# Patient Record
Sex: Female | Born: 1960 | Race: White | Hispanic: No | Smoking: Current some day smoker
Health system: Southern US, Community
[De-identification: ages and names within clinical notes are randomized; demographics above are authoritative.]

## PROBLEM LIST (undated history)

## (undated) DIAGNOSIS — Z8719 Personal history of other diseases of the digestive system: Secondary | ICD-10-CM

## (undated) DIAGNOSIS — J449 Chronic obstructive pulmonary disease, unspecified: Secondary | ICD-10-CM

## (undated) DIAGNOSIS — G43909 Migraine, unspecified, not intractable, without status migrainosus: Secondary | ICD-10-CM

## (undated) DIAGNOSIS — K219 Gastro-esophageal reflux disease without esophagitis: Secondary | ICD-10-CM

## (undated) DIAGNOSIS — K635 Polyp of colon: Secondary | ICD-10-CM

## (undated) DIAGNOSIS — M1990 Unspecified osteoarthritis, unspecified site: Secondary | ICD-10-CM

## (undated) DIAGNOSIS — K5909 Other constipation: Secondary | ICD-10-CM

## (undated) DIAGNOSIS — N393 Stress incontinence (female) (male): Secondary | ICD-10-CM

## (undated) DIAGNOSIS — F419 Anxiety disorder, unspecified: Secondary | ICD-10-CM

## (undated) DIAGNOSIS — J410 Simple chronic bronchitis: Secondary | ICD-10-CM

## (undated) DIAGNOSIS — I10 Essential (primary) hypertension: Secondary | ICD-10-CM

## (undated) DIAGNOSIS — G8929 Other chronic pain: Secondary | ICD-10-CM

## (undated) DIAGNOSIS — R490 Dysphonia: Secondary | ICD-10-CM

## (undated) DIAGNOSIS — M545 Other chronic pain: Secondary | ICD-10-CM

## (undated) DIAGNOSIS — D519 Vitamin B12 deficiency anemia, unspecified: Secondary | ICD-10-CM

## (undated) HISTORY — DX: Unspecified osteoarthritis, unspecified site: M19.90

## (undated) HISTORY — DX: Chronic obstructive pulmonary disease, unspecified: J44.9

## (undated) HISTORY — DX: Polyp of colon: K63.5

## (undated) HISTORY — DX: Anxiety disorder, unspecified: F41.9

## (undated) HISTORY — PX: APPENDECTOMY: SHX54

## (undated) HISTORY — DX: Migraine, unspecified, not intractable, without status migrainosus: G43.909

## (undated) HISTORY — PX: VAGINAL HYSTERECTOMY: SUR661

## (undated) HISTORY — DX: Gastro-esophageal reflux disease without esophagitis: K21.9

---

## 1998-04-17 ENCOUNTER — Encounter: Payer: Self-pay | Admitting: Emergency Medicine

## 1998-04-17 ENCOUNTER — Inpatient Hospital Stay (HOSPITAL_COMMUNITY): Admission: EM | Admit: 1998-04-17 | Discharge: 1998-04-19 | Payer: Self-pay | Admitting: Emergency Medicine

## 1998-04-21 ENCOUNTER — Encounter: Admission: RE | Admit: 1998-04-21 | Discharge: 1998-04-21 | Payer: Self-pay | Admitting: Sports Medicine

## 1998-07-26 ENCOUNTER — Encounter: Payer: Self-pay | Admitting: Emergency Medicine

## 1998-07-26 ENCOUNTER — Inpatient Hospital Stay (HOSPITAL_COMMUNITY): Admission: EM | Admit: 1998-07-26 | Discharge: 1998-07-26 | Payer: Self-pay | Admitting: Emergency Medicine

## 2001-08-07 ENCOUNTER — Emergency Department (HOSPITAL_COMMUNITY): Admission: EM | Admit: 2001-08-07 | Discharge: 2001-08-07 | Payer: Self-pay | Admitting: Internal Medicine

## 2002-11-17 ENCOUNTER — Emergency Department (HOSPITAL_COMMUNITY): Admission: EM | Admit: 2002-11-17 | Discharge: 2002-11-17 | Payer: Self-pay | Admitting: Emergency Medicine

## 2003-01-30 ENCOUNTER — Emergency Department (HOSPITAL_COMMUNITY): Admission: EM | Admit: 2003-01-30 | Discharge: 2003-01-30 | Payer: Self-pay | Admitting: Emergency Medicine

## 2003-07-29 ENCOUNTER — Emergency Department (HOSPITAL_COMMUNITY): Admission: EM | Admit: 2003-07-29 | Discharge: 2003-07-30 | Payer: Self-pay | Admitting: *Deleted

## 2003-09-13 ENCOUNTER — Observation Stay (HOSPITAL_COMMUNITY): Admission: RE | Admit: 2003-09-13 | Discharge: 2003-09-14 | Payer: Self-pay | Admitting: General Surgery

## 2003-09-13 HISTORY — PX: HEMORRHOID SURGERY: SHX153

## 2004-01-14 HISTORY — PX: CARDIAC CATHETERIZATION: SHX172

## 2004-01-25 ENCOUNTER — Emergency Department (HOSPITAL_COMMUNITY): Admission: EM | Admit: 2004-01-25 | Discharge: 2004-01-26 | Payer: Self-pay | Admitting: *Deleted

## 2004-08-08 ENCOUNTER — Inpatient Hospital Stay (HOSPITAL_COMMUNITY): Admission: EM | Admit: 2004-08-08 | Discharge: 2004-08-09 | Payer: Self-pay | Admitting: Emergency Medicine

## 2004-11-04 ENCOUNTER — Ambulatory Visit (HOSPITAL_COMMUNITY): Admission: RE | Admit: 2004-11-04 | Discharge: 2004-11-04 | Payer: Self-pay | Admitting: *Deleted

## 2004-12-28 ENCOUNTER — Emergency Department (HOSPITAL_COMMUNITY): Admission: EM | Admit: 2004-12-28 | Discharge: 2004-12-28 | Payer: Self-pay | Admitting: Emergency Medicine

## 2005-06-18 ENCOUNTER — Ambulatory Visit: Payer: Self-pay | Admitting: Orthopedic Surgery

## 2005-06-27 ENCOUNTER — Ambulatory Visit (HOSPITAL_COMMUNITY): Admission: RE | Admit: 2005-06-27 | Discharge: 2005-06-27 | Payer: Self-pay | Admitting: Orthopedic Surgery

## 2005-06-27 ENCOUNTER — Ambulatory Visit: Payer: Self-pay | Admitting: Orthopedic Surgery

## 2005-06-27 ENCOUNTER — Encounter (INDEPENDENT_AMBULATORY_CARE_PROVIDER_SITE_OTHER): Payer: Self-pay | Admitting: Specialist

## 2005-06-27 HISTORY — PX: OTHER SURGICAL HISTORY: SHX169

## 2005-06-30 ENCOUNTER — Ambulatory Visit: Payer: Self-pay | Admitting: Orthopedic Surgery

## 2005-07-09 ENCOUNTER — Ambulatory Visit: Payer: Self-pay | Admitting: Orthopedic Surgery

## 2005-07-17 ENCOUNTER — Ambulatory Visit: Payer: Self-pay | Admitting: Orthopedic Surgery

## 2006-06-01 ENCOUNTER — Ambulatory Visit: Payer: Self-pay | Admitting: Orthopedic Surgery

## 2006-06-03 ENCOUNTER — Encounter (HOSPITAL_COMMUNITY): Admission: RE | Admit: 2006-06-03 | Discharge: 2006-07-03 | Payer: Self-pay | Admitting: Orthopedic Surgery

## 2006-06-15 ENCOUNTER — Ambulatory Visit: Payer: Self-pay | Admitting: Orthopedic Surgery

## 2006-08-25 ENCOUNTER — Ambulatory Visit (HOSPITAL_COMMUNITY): Admission: RE | Admit: 2006-08-25 | Discharge: 2006-08-25 | Payer: Self-pay | Admitting: Family Medicine

## 2006-12-29 ENCOUNTER — Ambulatory Visit (HOSPITAL_COMMUNITY): Admission: RE | Admit: 2006-12-29 | Discharge: 2006-12-29 | Payer: Self-pay | Admitting: *Deleted

## 2006-12-30 ENCOUNTER — Ambulatory Visit (HOSPITAL_COMMUNITY): Admission: RE | Admit: 2006-12-30 | Discharge: 2006-12-30 | Payer: Self-pay | Admitting: Internal Medicine

## 2007-01-01 ENCOUNTER — Ambulatory Visit (HOSPITAL_COMMUNITY): Admission: RE | Admit: 2007-01-01 | Discharge: 2007-01-01 | Payer: Self-pay | Admitting: Internal Medicine

## 2007-01-21 ENCOUNTER — Ambulatory Visit (HOSPITAL_COMMUNITY): Admission: RE | Admit: 2007-01-21 | Discharge: 2007-01-21 | Payer: Self-pay | Admitting: Cardiovascular Disease

## 2007-02-23 ENCOUNTER — Ambulatory Visit (HOSPITAL_COMMUNITY): Admission: RE | Admit: 2007-02-23 | Discharge: 2007-02-23 | Payer: Self-pay | Admitting: General Surgery

## 2007-02-23 ENCOUNTER — Encounter (INDEPENDENT_AMBULATORY_CARE_PROVIDER_SITE_OTHER): Payer: Self-pay | Admitting: General Surgery

## 2007-02-23 HISTORY — PX: LAPAROSCOPIC CHOLECYSTECTOMY: SUR755

## 2007-03-16 ENCOUNTER — Ambulatory Visit (HOSPITAL_COMMUNITY): Admission: RE | Admit: 2007-03-16 | Discharge: 2007-03-16 | Payer: Self-pay | Admitting: Family Medicine

## 2007-03-17 ENCOUNTER — Ambulatory Visit (HOSPITAL_COMMUNITY): Admission: RE | Admit: 2007-03-17 | Discharge: 2007-03-17 | Payer: Self-pay | Admitting: Family Medicine

## 2007-04-09 ENCOUNTER — Ambulatory Visit: Payer: Self-pay | Admitting: Internal Medicine

## 2007-04-12 ENCOUNTER — Emergency Department (HOSPITAL_COMMUNITY): Admission: EM | Admit: 2007-04-12 | Discharge: 2007-04-12 | Payer: Self-pay | Admitting: Emergency Medicine

## 2007-04-13 ENCOUNTER — Ambulatory Visit: Payer: Self-pay | Admitting: Internal Medicine

## 2007-04-13 ENCOUNTER — Ambulatory Visit (HOSPITAL_COMMUNITY): Admission: RE | Admit: 2007-04-13 | Discharge: 2007-04-13 | Payer: Self-pay | Admitting: Internal Medicine

## 2007-04-13 DIAGNOSIS — K219 Gastro-esophageal reflux disease without esophagitis: Secondary | ICD-10-CM

## 2007-04-13 HISTORY — PX: COLONOSCOPY: SHX174

## 2007-04-13 HISTORY — DX: Gastro-esophageal reflux disease without esophagitis: K21.9

## 2007-05-12 ENCOUNTER — Ambulatory Visit: Payer: Self-pay | Admitting: Internal Medicine

## 2007-06-24 ENCOUNTER — Ambulatory Visit (HOSPITAL_COMMUNITY): Admission: RE | Admit: 2007-06-24 | Discharge: 2007-06-24 | Payer: Self-pay | Admitting: Internal Medicine

## 2007-06-28 ENCOUNTER — Ambulatory Visit (HOSPITAL_COMMUNITY): Admission: RE | Admit: 2007-06-28 | Discharge: 2007-06-28 | Payer: Self-pay | Admitting: Internal Medicine

## 2007-07-19 ENCOUNTER — Encounter (HOSPITAL_COMMUNITY): Admission: RE | Admit: 2007-07-19 | Discharge: 2007-08-18 | Payer: Self-pay | Admitting: Gastroenterology

## 2007-09-14 ENCOUNTER — Ambulatory Visit: Payer: Self-pay | Admitting: Internal Medicine

## 2008-05-19 ENCOUNTER — Ambulatory Visit (HOSPITAL_COMMUNITY): Admission: RE | Admit: 2008-05-19 | Discharge: 2008-05-19 | Payer: Self-pay | Admitting: Internal Medicine

## 2008-11-10 ENCOUNTER — Observation Stay (HOSPITAL_COMMUNITY): Admission: EM | Admit: 2008-11-10 | Discharge: 2008-11-11 | Payer: Self-pay | Admitting: Emergency Medicine

## 2009-02-28 ENCOUNTER — Emergency Department (HOSPITAL_COMMUNITY): Admission: EM | Admit: 2009-02-28 | Discharge: 2009-02-28 | Payer: Self-pay | Admitting: Emergency Medicine

## 2009-06-21 ENCOUNTER — Ambulatory Visit (HOSPITAL_COMMUNITY): Admission: RE | Admit: 2009-06-21 | Discharge: 2009-06-21 | Payer: Self-pay | Admitting: Internal Medicine

## 2009-08-21 ENCOUNTER — Emergency Department (HOSPITAL_COMMUNITY): Admission: EM | Admit: 2009-08-21 | Discharge: 2009-08-21 | Payer: Self-pay | Admitting: Emergency Medicine

## 2009-09-18 ENCOUNTER — Emergency Department (HOSPITAL_COMMUNITY)
Admission: EM | Admit: 2009-09-18 | Discharge: 2009-09-18 | Payer: Self-pay | Source: Home / Self Care | Admitting: Emergency Medicine

## 2009-11-14 ENCOUNTER — Emergency Department (HOSPITAL_COMMUNITY): Admission: EM | Admit: 2009-11-14 | Discharge: 2009-11-15 | Payer: Self-pay | Admitting: Emergency Medicine

## 2010-02-03 ENCOUNTER — Encounter: Payer: Self-pay | Admitting: Family Medicine

## 2010-02-03 ENCOUNTER — Encounter: Payer: Self-pay | Admitting: Internal Medicine

## 2010-03-28 LAB — URINALYSIS, ROUTINE W REFLEX MICROSCOPIC
Bilirubin Urine: NEGATIVE
Nitrite: NEGATIVE
Specific Gravity, Urine: <1.005 — ABNORMAL LOW (ref 1.005–1.030)
pH: 6 (ref 5.0–8.0)

## 2010-03-28 LAB — CBC
Hemoglobin: 12.6 g/dL (ref 12.0–15.0)
MCH: 31.5 pg (ref 26.0–34.0)
Platelets: 252 10*3/uL (ref 150–400)
RBC: 3.99 MIL/uL (ref 3.87–5.11)
WBC: 11.9 10*3/uL — ABNORMAL HIGH (ref 4.0–10.5)

## 2010-03-28 LAB — HEPATIC FUNCTION PANEL
ALT: 16 U/L (ref 0–35)
AST: 16 U/L (ref 0–37)
Albumin: 3.7 g/dL (ref 3.5–5.2)
Alkaline Phosphatase: 84 U/L (ref 39–117)
Total Bilirubin: 0.2 mg/dL — ABNORMAL LOW (ref 0.3–1.2)
Total Protein: 6.7 g/dL (ref 6.0–8.3)

## 2010-03-28 LAB — BASIC METABOLIC PANEL
CO2: 30 mEq/L (ref 19–32)
Calcium: 9.8 mg/dL (ref 8.4–10.5)
GFR calc Af Amer: >60 mL/min (ref >60)
Sodium: 138 mEq/L (ref 135–145)

## 2010-03-28 LAB — URINE MICROSCOPIC-ADD ON

## 2010-03-28 LAB — DIFFERENTIAL
Eosinophils Absolute: 0.2 10*3/uL (ref 0.0–0.7)
Lymphocytes Relative: 25 % (ref 12–46)
Lymphs Abs: 2.9 10*3/uL (ref 0.7–4.0)
Monocytes Relative: 5 % (ref 3–12)
Neutro Abs: 8.2 10*3/uL — ABNORMAL HIGH (ref 1.7–7.7)
Neutrophils Relative %: 69 % (ref 43–77)

## 2010-04-08 ENCOUNTER — Encounter (HOSPITAL_COMMUNITY): Payer: Self-pay

## 2010-04-08 ENCOUNTER — Ambulatory Visit (HOSPITAL_COMMUNITY)
Admission: RE | Admit: 2010-04-08 | Discharge: 2010-04-08 | Disposition: A | Discharge status: Home / Self Care | Payer: Medicare Other | Source: Ambulatory Visit | Attending: Internal Medicine | Admitting: Internal Medicine

## 2010-04-08 ENCOUNTER — Other Ambulatory Visit (HOSPITAL_COMMUNITY): Payer: Self-pay | Admitting: Internal Medicine

## 2010-04-08 DIAGNOSIS — R059 Cough, unspecified: Secondary | ICD-10-CM

## 2010-04-08 DIAGNOSIS — R05 Cough: Secondary | ICD-10-CM | POA: Insufficient documentation

## 2010-04-18 LAB — CARDIAC PANEL(CRET KIN+CKTOT+MB+TROPI)
CK, MB: 1.1 ng/mL (ref 0.3–4.0)
CK, MB: 1.4 ng/mL (ref 0.3–4.0)
Relative Index: INVALID (ref 0.0–2.5)
Total CK: 41 U/L (ref 7–177)
Total CK: 49 U/L (ref 7–177)
Total CK: 61 U/L (ref 7–177)
Troponin I: 0.04 ng/mL (ref 0.00–0.06)

## 2010-04-18 LAB — URINALYSIS, ROUTINE W REFLEX MICROSCOPIC
Bilirubin Urine: NEGATIVE
Nitrite: NEGATIVE
Specific Gravity, Urine: <1.005 — ABNORMAL LOW (ref 1.005–1.030)
Urobilinogen, UA: 0.2 mg/dL (ref 0.0–1.0)
pH: 5.5 (ref 5.0–8.0)

## 2010-04-18 LAB — BASIC METABOLIC PANEL
Chloride: 106 mEq/L (ref 96–112)
GFR calc non Af Amer: >60 mL/min (ref >60)
Glucose, Bld: 152 mg/dL — ABNORMAL HIGH (ref 70–99)
Potassium: 4.3 mEq/L (ref 3.5–5.1)
Sodium: 138 mEq/L (ref 135–145)

## 2010-04-18 LAB — BLOOD GAS, ARTERIAL
Acid-base deficit: 1 mmol/L (ref 0.0–2.0)
Bicarbonate: 23.6 mEq/L (ref 20.0–24.0)
O2 Saturation: 97.1 %
TCO2: 21.7 mmol/L (ref 0–100)
pO2, Arterial: 83.6 mmHg (ref 80.0–100.0)

## 2010-04-18 LAB — POCT CARDIAC MARKERS
CKMB, poc: <1 ng/mL — ABNORMAL LOW (ref 1.0–8.0)
CKMB, poc: <1 ng/mL — ABNORMAL LOW (ref 1.0–8.0)
Myoglobin, poc: 19.5 ng/mL (ref 12–200)
Troponin i, poc: <0.05 ng/mL (ref 0.00–0.09)

## 2010-04-18 LAB — POCT I-STAT, CHEM 8
Calcium, Ion: 1.13 mmol/L (ref 1.12–1.32)
Chloride: 105 mEq/L (ref 96–112)
Creatinine, Ser: 0.7 mg/dL (ref 0.4–1.2)
Glucose, Bld: 101 mg/dL — ABNORMAL HIGH (ref 70–99)
HCT: 38 % (ref 36.0–46.0)
Hemoglobin: 12.9 g/dL (ref 12.0–15.0)
Potassium: 4.3 mEq/L (ref 3.5–5.1)

## 2010-04-18 LAB — PROTIME-INR: Prothrombin Time: 11.7 seconds (ref 11.6–15.2)

## 2010-04-18 LAB — CBC
HCT: 39 % (ref 36.0–46.0)
Hemoglobin: 13.5 g/dL (ref 12.0–15.0)
MCV: 92.2 fL (ref 78.0–100.0)
Platelets: 214 10*3/uL (ref 150–400)
RDW: 14.2 % (ref 11.5–15.5)

## 2010-04-18 LAB — DIFFERENTIAL
Basophils Absolute: 0.1 10*3/uL (ref 0.0–0.1)
Eosinophils Absolute: 0.1 10*3/uL (ref 0.0–0.7)
Eosinophils Relative: 1 % (ref 0–5)
Monocytes Absolute: 0.4 10*3/uL (ref 0.1–1.0)

## 2010-05-28 NOTE — Op Note (Signed)
Tanya Velazquez, Tanya Velazquez             ACCOUNT NO.:  [id number]   MEDICAL RECORD NO.:  [id number]          PATIENT TYPE:  AMB   LOCATION:  DAY                           FACILITY:  [facility]   PHYSICIAN:  R. Roetta Sessions, M.D.      DATE OF BIRTH:   DATE OF PROCEDURE:  04/13/2007  DATE OF DISCHARGE:                               OPERATIVE REPORT   INDICATIONS FOR PROCEDURE:  A 50 year old lady with refractory  gastroesophageal reflux disease symptoms, she describes as heartburn all  day.  She has chronic constipation and intermittent hematochezia.  EGD  and colonoscopy now being done.  This approach has been discussed with  the patient at length.  Potential risks, benefits, alternatives, and  limitations have been reviewed and questions answered.  She is  agreeable.  Please see documentation  in medical record.   PROCEDURE NOTE:  O2 saturation, blood pressure, pulse, and respirations  were monitored throughout the entirety of both procedures.  Conscious  sedation, Phenergan 25 mg diluted IV push to augment conscious sedation,  Demerol 125 mg, and Versed 5 mg IV in divided doses with Cetacaine spray  for topical  oropharyngeal anesthesia.  Instrument Pentax video chip  system.   FINDINGS:  EGD examination of tubular esophagus revealed no mucosal  abnormalities.  EG junction easily traversed.  The EG junction was  patulous.  The gastric cavity was empty and insufflated well with air.  Thorough examination of gastric mucosa including retroflexed view of the  proximal stomach and the esophagogastric junction demonstrated only a  hiatal hernia and some superficial antral erosions.  There was no ulcer  or infiltrating process.  Pylorus was patent, easily traversed.  Examination of the bulb and second portion revealed no abnormalities.   THERAPEUTIC/DIAGNOSTIC MANEUVERS PERFORMED:  None.   The patient tolerated procedure well and was prepared for colonoscopy.   Digital rectal exam revealed no  abnormalities aside from friable anal  canal hemorrhoids.   ENDOSCOPIC FINDINGS:  Prep was good.  Colon:  Colonic mucosa was  surveyed from the rectosigmoid junction to the left transverse,  right  colon,  through the appendiceal orifice, ileocecal valve, and cecum.  These structures well seen and photographed for the record.  The distal  5 cm terminal ileum viewed with intubation of the ileocecal valve.  From  this level, scope was slowly withdrawn. All previously mentioned mucosal  surfaces were again seen.  The colonic mucosa as well as the terminal  ileal mucosa appeared normal.  Scope was pulled down in the rectum.  Thorough examination of the rectal mucosa including retroflexed view of  the anal verge and en fosse view of the anal canal demonstrated again  only friable anal canal hemorrhoids.  The patient tolerated both  procedures well and reactive to endoscopy.   IMPRESSION:  Esophagogastroduodenoscopy; patulous esophagogastric  junction, otherwise normal esophagus, hiatal hernia, superficial antral  erosions, otherwise normal gastric mucosa.  Patent pylorus.  Normal D1,  D2.   Colonoscopy findings:  Friable anal canal, otherwise normal rectum,  colon, and terminal ileum.   RECOMMENDATIONS:  1. Standard instructions for  colonoscopy and EGD provided to  this      patient.  In addition, she was started on Anusol-HC suppositories      one per rectum at bedtime x10 days.  2. We will treat for gastroesophageal reflux with a different agent,      specifically I have recommended she stop Nexium and begin Capidex      60 mg once daily before breakfast.  3. Follow up with [place] in 1 month to assess her progress.      Jonathon Bellows, M.D.  Electronically Signed     RMR/MEDQ  D:  04/27/2007  T:  04/28/2007  Job:  [id number]   cc:   Tilford Pillar, MD  Fax: [phone]   Kirk Ruths, M.D.  Fax: [phone]

## 2010-05-28 NOTE — Consult Note (Signed)
[name], Tanya             ACCOUNT NO.:  [id number]   MEDICAL RECORD NO.:  [id number]          PATIENT TYPE:  AMB   LOCATION:  DAY                           FACILITY:  [facility]   PHYSICIAN:  R. Roetta Sessions, M.D. DATE OF BIRTH:  11-03-1960   DATE OF CONSULTATION:  DATE OF DISCHARGE:                                 CONSULTATION   REQUESTING PHYSICIAN:  Tilford Pillar, M.D.   PRIMARY CARE PHYSICIAN:  Kirk Ruths, M.D.   CARDIOLOGIST:  Dani Gobble, M.D.   NEUROLOGIST:  Dr. Benson Setting.   REASON FOR CONSULTATION:  Refractory GERD.   HISTORY OF PRESENT ILLNESS:  [name] is a 50 year old female who has  history of chronic GERD.  She was actually extensively evaluated by Dr.  Jena Gauss back in 2001.  She has recently undergone laparoscopic  cholecystectomy by Dr. Lovell Sheehan with history of gallbladder polyposis.  Her surgery was around March 14, 2007, per her report.  She notices over  the last year that she has had worsening heartburn and indigestion on a  daily basis.  She tells me she wakes up first thing in the morning with  it.  It persists throughout the day.  She has symptoms when she goes to  bed at night and she even awakens at night with symptoms.  She is  currently taking Nexium 40 mg b.i.d. and has for about the last 3 weeks.  Prior to this she was on once daily for 7 months, and prior to this she  was on over-the-counter Pepcid.  She does have regurgitation.  Occasionally she does have water brash and transient nausea but denies  any vomiting.  She is on etodolac b.i.d. for the last 5 months.  She  also has recently been on prednisone for a COPD exacerbation.  She does  take aspirin 325 mg daily as well.  She has had a history of chronic  constipation.  She has noticed hematochezia.  She has noticed bright red  blood on the toilet tissue with wiping on several occasions.  She has  had to take  Correctol once or twice per month to elicit a bowel  movement.  She can go  up to a week without a bowel movement.  Her weight  has remained stable.   PAST MEDICAL/SURGICAL HISTORY:  Chronic GERD.  Workup has included an  EGD back in 2000, which showed patulous GE junction.  She was  empirically dilated with a 56-French Maloney dilator.  She had antral  erosions of uncertain significance.  She had a CLO biopsy which was  negative.  She had an esophageal pH study by Dr. Jena Gauss on December 28, 1998, which showed her esophageal acid exposure was well within normal  limits for physiologic reflux.  Study was done on high-dose AcipHex.  It  was  felt that she may have an acid-sensitive esophagus at that time.  She has chronic constipation, migraine headaches.  She is on hormone  replacement therapy.  She has history of chronic anxiety, she believes  COPD.  She underwent cholecystectomy for gallbladder polyps.  She is  status post complete hysterectomy.  She had a recent catheterization by  Dr. Domingo Sep.  She is unsure as to the findings, but she was placed on  aspirin.   CURRENT MEDICATIONS:  1. Xanax 1 mg q.i.d.  2. Nexium 40 mg b.i.d.  3. Etodolac 500 mg b.i.d.  4. Lexapro 10 mg daily.  5. Meloxicam 15 mg daily.  6. Depakote ER 250 mg daily.  7. Amitriptyline HCl 50 mg daily.  8. Percocet 10 mg q.i.d.  9. Aspirin 325 mg daily.   ALLERGIES:  No known drug allergies.   FAMILY HISTORY:  There is no known family history of colorectal  carcinoma or other chronic GI problems.  Mother age 23 has hypertension.  Father age 40 has Alzheimer's as well as hypertension.  She has 5  healthy siblings.   SOCIAL HISTORY:  [name] is single.  She has 3 healthy children.  She has a 30-pack-year history of tobacco use.  Denies any alcohol or  drug use.  She is unemployed.   REVIEW OF SYSTEMS:  See HPI.  Otherwise negative.   PHYSICAL EXAMINATION:  VITAL SIGNS:  Weight 162 pounds, height 62-1/2  inches, temperature 97.9, blood pressure 128/88 and pulse 80.  GENERAL:   [name] is a well-developed, well-nourished female in no  acute distress.  HEENT:  Sclerae are clear.  Nonicteric.  Conjunctivae are pink.  Oropharynx pink and moist without any lesions.  NECK:  Supple without mass or thyromegaly.  CHEST:  Heart regular rate and rhythm.  Normal S1 and S2 without any  murmurs, clicks, rubs, or gallops.  LUNGS:  Clear to auscultation bilaterally.  ABDOMEN:  Positive bowel sounds x4.  No bruits auscultated.  Soft,  nontender, nondistended without palpable mass or hepatosplenomegaly.  No  rebound tenderness or guarding.  EXTREMITIES:  With no clubbing or edema  bilaterally.  SKIN:  Pink, warm and dry without any rash or jaundice.   IMPRESSION:  [name] is a 50 year old female with refractory  gastroesophageal reflux disease symptoms, mostly severe heartburn all  day long.  She has a history of this and was evaluated by Dr. Jena Gauss  years ago.  She has also been using a significant amount of NSAIDs,  including etodolac b.i.d. as well as aspirin and even recently some  prednisone, making her definitely at risk for gastritis or peptic ulcer  disease.  She is going to require further evaluation to rule out  complicated gastroesophageal reflux disease and peptic ulcer disease.   She has chronic constipation and now with intermittent hematochezia and  has never had a colonoscopy.  She does need a colonoscopy to rule out  colorectal carcinoma, although bleeding could be from benign anorectal  source such as hemorrhoids or fissure.   PLAN:  1. Begin MiraLax 17 grams daily as needed for constipation.  2. Constipation __________ for review.  3. She is to remain on Nexium 40 mg b.i.d. for now.  4. EGD and colonoscopy with Dr. Jena Gauss in the near future as discussed.      Both procedures, including risks, benefits, including but not      limited to infection, perforation, drug reaction.  She agrees with      the plan and      consent will be obtained.    Thank you, Dr. Leticia Penna, for allowing [place] to participate in the care of  [name].      Lorenza Burton, N.P.      Jonathon Bellows,  M.D.  Electronically Signed    KJ/MEDQ  D:  04/09/2007  T:  04/09/2007  Job:  [id number]   cc:   Kirk Ruths, M.D.  Fax: [phone]   Dani Gobble, MD  Fax: [phone]   Tilford Pillar, MD  Fax: [phone]

## 2010-05-28 NOTE — Op Note (Signed)
Tanya Velazquez, BOURDEAU NO.:  [id number]   MEDICAL RECORD NO.:  [id number]          PATIENT TYPE:  AMB   LOCATION:  DAY                           FACILITY:  [facility]   PHYSICIAN:  Tilford Pillar, MD      DATE OF BIRTH:  [birth year]   DATE OF PROCEDURE:  02/23/2007  DATE OF DISCHARGE:  02/23/2007                               OPERATIVE REPORT   PREOPERATIVE DIAGNOSIS:  Gallbladder polyps.   POSTOPERATIVE DIAGNOSIS:  Gallbladder polyps.   PROCEDURE:  Laparoscopic cholecystectomy.   SURGEON:  Tilford Pillar, M.D.   ANESTHESIA:  General endotracheal, local anesthetic with 1% Sensorcaine  plain.   ESTIMATED BLOOD LOSS:  Minimal.   SPECIMENS:  Gallbladder.   INDICATIONS FOR PROCEDURE:  The patient is a 50 year old female recently  evaluated in my office as an outpatient.  She had a history of  significant epigastric and substernal chest pain which had been worked  up extensively prior to her office consultation.  She did have a cardiac  monitoring at the time of consultation and therefore was advised that  she undergo cardiac clearance before any additional surgical procedures  be performed.  She did have a right upper quadrant ultrasound obtained  previously demonstrating presence of small multiple biliary polyps  within the gallbladder.  Based on this and symptomatology consistent  with biliary etiology, bloating, nausea, with increased symptoms  postprandial, it was advised that she undergo a laparoscopic or possible  open cholecystectomy.  The risks, benefits, and alternatives of which  were discussed at length with the patient.  Her questions and concerns  were answered.  Cardiac clearance was obtained.  The patient was  consented for the planned procedure.   DESCRIPTION OF PROCEDURE:  The patient was taken to the operating room  and was placed in the supine position on the operating table at which  time she was given general anesthesia.  The patient was  endotracheally  intubated by anesthesia.  At this point the patient's abdomen was  prepped and draped in the usual sterile fashion.  Infraumbilical stab  incision was created with an 11 blade scalpel.  Additional dissection  down to the subcuticular tissue was carried out using a Kocher clamp  which was utilized to grasp the anterior abdominal wall fascia and  muscles anteriorly.  At this point a Veress needle was inserted.  Saline  drop test was utilized to confirm intraperitoneal placement and then  pneumoperitoneum was initiated.  Once the patient's pneumoperitoneum was  obtained, an 11 mm trocar was placed over the laparoscope allowing  visualization of the trocar entering into the peritoneal cavity.  At  this point the pneumoperitoneum was removed and the laparoscope was  reinserted.  There was no evidence of any trocar or Veress needle  placement injury.  At this time the remaining trocars were placed with  an 11 mm trocar in the epigastrium, a 5 mm trocar in the midline between  the two 11 mm trocars, and a 5 mm trocar in the right lateral abdominal  wall.  At this time the patient was placed in the  head up left lateral  decubitus position.  The gallbladder fundus was grasped with a regular  grasper and elevated up over the liver.  Blunt dissection was carried  out to strip the peritoneal reflection off the infundibulum of the  gallbladder exposing the cystic duct as it entered into the  infundibulum.  A window was created behind the cystic duct.  Three  endoclips were placed proximally, one distally, and the cystic duct was  divided between the two most distal clips.  The cystic artery was  identified.  A window was created behind this and two endoclips were  placed proximally and one distally and the cystic artery was divided  between the two most distal clips.  At this point electrocautery was  brought into the field and was utilized to dissect the gallbladder free  from the  gallbladder fossa.  During the dissection a small  cholecystotomy was created allowing some bile spillage.  This was  aspirated with a suction aspirator.  The gallbladder was then placed in  an EndoCatch bag, placed up and over the right lobe of the liver.  At  this time the gallbladder fossa was evaluated.  There was no evidence of  any hemorrhage.  Hemostasis was excellent having been controlled with  electrocautery.  A piece of Surgicel was placed into the gallbladder  fossa.  The clips were noted to be in excellent position and intact.  At  this time attention was turned to the closure of the fascia.   At this point using an Endoclose suture passing device, a 2-0 Vicryl was  placed through both the 11 mm trocar sites.  With these Vicryl sutures  in place, the EndoCatch bag was removed through the epigastric trocar  site in an intact EndoCatch bag.  The gallbladder was placed on the back  stable and sent as a permanent specimen to pathology.  At this time the  pneumoperitoneum was evacuated.  The patient was returned to supine  position and the Vicryl sutures were secured.  Local anesthetic was  instilled.  Hemostasis was obtained using electrocautery in the trocar  sites and then a 4-0 Monocryl was utilized in a running subcuticular  suture to reapproximate the skin edges at all four trocar sites.  It is  noted that the patient does have some oozing from all four trocar sites.  She has a history of being on aspirin and although she was asked to  discontinue it approximately 7 days ago, it was suspected that the  patient did continue her aspirin use based on the appearance of the slow  ooze with closure of the skin and simple compression of the trocar  sites, just direct pressure oozing was ceased.  At this point the skin  was washed and dried with a moist and dry towel.  Benzoin was applied to  her incisions and half inch Steri-Strips were placed over the incisions.  The drapes were  removed.  The patient was allowed to come out of general  anesthesia and she was transferred to a regular hospital bed.  She was  transferred to the postanesthesia care unit in stable condition.  At the  conclusion of the procedure, all needle, sponge, and instrument counts  correct.  The patient tolerated the procedure well.      Tilford Pillar, MD  Electronically Signed     BZ/MEDQ  D:  02/23/2007  T:  02/25/2007  Job:  [id number]   cc:   Kirk Ruths,  M.D.  Fax: [phone]

## 2010-05-28 NOTE — Assessment & Plan Note (Signed)
NAMELENNIX, ROTUNDO              CHART#:  [id number]   DATE:  05/12/2007                       DOB:  May 26, 1960   CHIEF COMPLAINT:  Follow-up EGD.   SUBJECTIVE:  The patient is a 50 year old female.  She underwent EGD by  Dr. Jena Gauss on April 13, 2007.  She has history of chronic refractory  GERD, mostly heartburn.  She also had colonoscopy at the same time given  her history of chronic constipation and intermittent hematochezia.  She  was found to have a patulous EG junction, a hiatal hernia, superficial  antral erosions and otherwise normal EGD.  She was found to have a  friable anal canal, otherwise normal rectum , colon, terminal ileum.  She was given Anusol suppositories.  She was also changed from Nexium to  Kapidex 60 mg daily.  She states she is 50% better since the EGD.  She  continues to have breakthrough heartburn as well as water brash.  She is  taking Etodolac b.i.d.  She denies any rectal bleeding or melena.  She  does have occasional low pelvic cramps.  She continues to have  constipation, can go up to several days without a bowel movement.  She  is having nausea virtually every day, especially first thing in the  morning.  She had some vomiting 3 days ago which lasted all day long.  She denies any fever.  Denies any chills.   CURRENT MEDICATIONS:  See the list from May 12, 2007.   ALLERGIES:  No known drug allergies.   OBJECTIVE:  VITAL SIGNS:  Weight 160 pounds, height 62 inches,  temperature 98 degrees, blood pressure 110/70, pulse 64.  GENERAL:  The patient is a well-developed, well-nourished female in no  acute stress.  HEENT:  Sclerae clear, nonicteric.  Conjunctivae pink.  Oropharynx pink  and moist without any lesions.  CHEST:  Heart regular rate and rhythm.  Normal S1, S2.  ABDOMEN:  Positive bowel sounds x4.  No bruits auscultated.  Soft,  nontender, nondistended without palpable mass or hepatosplenomegaly.  No  rebound tenderness or guarding.  EXTREMITIES:  Without clubbing or edema.   ASSESSMENT:  The patient is a 50 year old female with refractory  gastroesophageal reflux disease despite proton pump inhibitor, mostly  heartburn.  I feel her symptoms may be exacerbated by Etodolac b.i.d.  usage and have asked her to hold this.  She may also have an element of  gastroparesis given her chronic nausea.   She has chronic constipation, but has not tried MiraLax as directed.  Recent colonoscopy was reassuring.   PLAN:  1. Discontinue Etodolac for at least 1 week trial to see if this      changes any of her GERD symptoms including heartburn.  2. She can use Tylenol Arthritis in the interim p.r.n.  3. Begin MiraLax 17 grams daily for constipation.  4. Constipation literature given for her review.  5. If no improvement, would consider a gastric emptying study as next      step given her chronic nausea and heartburn.       Lorenza Burton, N.P.  Electronically Signed     R. Roetta Sessions, M.D.  Electronically Signed    KJ/MEDQ  D:  05/12/2007  T:  05/12/2007  Job:  [id number]   cc:   Kirk Ruths, M.D.

## 2010-05-28 NOTE — Cardiovascular Report (Signed)
Tanya Velazquez, Tanya Velazquez             ACCOUNT NO.:  [id number]   MEDICAL RECORD NO.:  [id number]          PATIENT TYPE:  OIB   LOCATION:  2855                         FACILITY:  [facility]   PHYSICIAN:  Richard A. Alanda Amass, M.D.DATE OF BIRTH:  1960/03/11   DATE OF PROCEDURE:  01/21/2007  DATE OF DISCHARGE:                            CARDIAC CATHETERIZATION   PROCEDURE:  Head-up tilt table testing with Isuprel infusion by standard  protocol.   COMPLICATIONS:  None.   This 50 year old white woman was referred for tilt table testing because  of a history of chronic recurrent syncopal episodes.  She has multiple  other complaints as outlined in her extensive workup office note by Dr.  Domingo Sep on December 29, 2006, and prior to this.  She has a component  of psychological overlay associated with this, is a chronic smoker, and  essentially has had normal coronary arteries and normal LV function at  prior catheterization in 2006.  She is scheduled for follow-up testing  per Dr. Domingo Sep to include a repeat Cardiolite because of complaints of  chest pain on outpatient monitoring.   The patient reported to the hospital as a same-day admission.  She did  not take any of her medications and she was in a postabsorptive state.   She was brought to the catheterization lab for head-up tilt table  testing.   The patient's baseline blood pressure was 99-105/65 with a heart rate of  65 in sinus rhythm.  O2 saturations 100%.  She underwent head-up tilting  at 70 degrees for a total of 30 minutes.  Blood pressure remained stable  at 102/63, heart rate transiently went up to 91, and O2 saturation  remained stable at 100.  There was no significant bradycardia or  hypotension and no symptoms.   Isuprel infusion was then begun after a period of stabilization in the  supine position.  Isuprel infusion was continued until heart rate was  just greater than 120 per minute and she was up to 1.5-1.7 mcg/min.  infusion.  She then underwent repeat 70-degree head-up tilt testing.  Her blood pressure remained stable at 100-120/70 with heart rate of 123  and stable O2 two saturations at 100%.   The study was then terminated and she was laid supine.  Isuprel was  discontinued.  She tolerated the procedure well, will be transferred to  the outpatient holding area for observation, then probably discharged  today.  The patient has had a negative tilt table test including Isuprel  infusion as outlined above with no significant bradycardia or  hypotension.  Of note is that the patient has had a prior tilt table  test November 04, 2004, that was also negative.      Richard A. Alanda Amass, M.D.  Electronically Signed     RAW/MEDQ  D:  01/21/2007  T:  01/21/2007  Job:  [id number]   cc:   Dani Gobble, MD  Madelin Rear. Sherwood Gambler, MD

## 2010-05-28 NOTE — Assessment & Plan Note (Signed)
NAMEMarland Velazquez  [name], Tanya Velazquez              CHART#:  [id number]   DATE:  09/14/2007                       DOB:  [birth year]   PRIMARY CARE PHYSICIAN:  Kirk Ruths, MD   PRIMARY GASTROENTEROLOGIST:  Jonathon Bellows, MD   PROBLEM LIST:  1. Gastroparesis with abnormal GES on July 19, 2007.  2. History of chronic gastroesophageal reflux disease.  EGD by Dr.      Jena Gauss on April 13, 2007, patulous esophagogastric junction, hiatal      hernia, circumferential antral erosions, otherwise normal.  3. Chronic constipation.  Last colonoscopy by Dr. Jena Gauss on April 13, 2007, friable anal canal, otherwise normal colonoscopy.  4. History of esophageal dysphagia, last empirically dilated back in      2000.  5. History of acid-sensitive esophagus, status post pH study in 2000.  6. Migraine headaches.  7. Anxiety.  8. Chronic obstructive pulmonary disease.  9. Cholecystectomy for gallbladder polyp.  10.Status post complete hysterectomy.   SUBJECTIVE:  The patient is a 51 year old Caucasian female.  She has  history of gastroparesis.  She has been on Reglan 5 mg b.i.d., this has  helped some, but not completely.  She continues to complain of  refractory heartburn and indigestion.  She has been on Kapidex 60 mg  daily.  She has previously failed Prilosec 20 mg b.i.d. for more than a  month.  She was previously taking etodolac, which she discontinued and  is now taking meloxicam.   CURRENT MEDICATIONS:  See the list from September 14, 2007.   ALLERGIES:  No known drug allergies.   OBJECTIVE:  VITAL SIGNS:  Weight 162 pounds, height 52-1/2 inches,  temperature 98.2, blood pressure 120/80, and pulse 80.  GENERAL:  The patient is a well-developed, well-nourished Caucasian  female in no acute distress.  HEENT:  Sclerae clear, nonicteric.  Conjunctivae pink.  Oropharynx pink  and moist without any lesions.  NECK:  Supple without mass or thyromegaly.  CHEST:  Heart regular rate and rhythm.  Normal  S1 and S2 without  murmurs, clicks, rubs, or gallops.  ABDOMEN:  Positive bowel sounds x4.  No bruits auscultated.  Soft,  nontender, nondistended without palpable mass or hepatosplenomegaly.  No  rebound, tenderness, or guarding.   ASSESSMENT:  The patient is a 50 year old Caucasian female with history  of gastroparesis.  I feel she needs a higher dose Reglan to get  promotility effect.  She denies any side effects.  She also has acid-  sensitive esophagus and refractory gastroesophageal reflux disease.   PLAN:  1. Reglan 10 mg q.i.d. #120 with one refill.  2. Discontinue Kapidex.  3. Discontinue meloxicam.  4. She will discuss her arthritic pain management with Dr. Regino Schultze in      a non-NSAID form.  5. Aciphex 20 mg b.i.d. #60 with five refills.  6. Follow up with Dr. Jena Gauss in 1 month to reassess her progress on the      higher dose Reglan.       Tanya Velazquez, N.P.  Electronically Signed     R. Roetta Sessions, M.D.  Electronically Signed    KJ/MEDQ  D:  09/15/2007  T:  09/16/2007  Job:  [id number]   cc:   Kirk Ruths, M.D.

## 2010-05-31 NOTE — Cardiovascular Report (Signed)
NAMECATHERYNE, Tanya Velazquez NO.:  [id number]   MEDICAL RECORD NO.:  [id number]          PATIENT TYPE:  INP   LOCATION:  4703                         FACILITY:  [facility]   PHYSICIAN:  Cristy Hilts. Jacinto Halim, MD       DATE OF BIRTH:  [birth year]   DATE OF PROCEDURE:  08/09/2004  DATE OF DISCHARGE:  08/09/2004                              CARDIAC CATHETERIZATION   PROCEDURE PERFORMED:  1.  Left ventriculography.  2.  Selective left coronary arteriography.  3.  Ascending aortogram.  4.  Right femoral angiography and closure of right femoral access with Star      Close.   INDICATIONS:  Tanya Velazquez is a 50 year old female with no significant prior  cardiac history who was admitted through [facility] complaining of  substernal chest pain. She also complained of mild shortness of breath and  dyspnea on exertion. Given the fact that she continued to have chest  discomfort she was transferred over to [facility] for cardiac  catheterization  to evaluate her coronary anatomy. She also has a history of  tobacco abuse and a strong family history of premature coronary artery  disease.   HEMODYNAMIC DATA:  The left ventricular pressure was 90/6 with an end-  diastolic pressure of 20 mmHg. The aortic pressure was 108/60 with a mean of  81 mmHg. There was no pressure gradient across the aortic valve.   ANGIOGRAPHIC DATA:  Left ventricle: The left ventricular systolic function  was normal. Ejection fraction was estimated at 60% to 65%. There was no  mitral regurgitation. There was no mitral valve prolapse.   Right coronary artery: The right coronary artery is a large caliber vessel.  It has minimal luminal irregularity, but otherwise is normal. It is a  dominant vessel.   Left main: The left main has a superior take off; otherwise, it is normal.   Left anterior descending: The left anterior descending is a large caliber  vessel. It is normal. It gives origin to three small to  moderate size  diagonals in the proximal mid and distal segments. There is mild  intramyocardial bridging and kinking noted in its mid segment.   Circumflex: The circumflex is a large caliber vessel. It gives rise to a  large first obtuse marginal. It is normal.   Ascending aortogram: The ascending aortogram REVEALED presence of three  aortic valve cusps. There is no evidence of aortic dissection and no  evidence of aortic regurgitation.   IMPRESSION:  1.  Normal coronary arteries, right dominant circulation. The left main has      a superior takeoff, but is otherwise normal.  2.  No evidence of aortic dissection or ascending aortic aneurysm.  3.  Evaluation for noncardiac cause of chest pain is indicated. The patient      will be discharged home on primary prevention with aspirin 81 mg once a      day, smoking cessation. The patient will follow up with Dr. Dani Gobble.   TECHNIQUES OF PROCEDURE:  Under the usual sterile procedure, using a 6-  French right femoral artery access, 6-French multiple B2 catheter was  advanced through the ascending aorta over a 0.035-inch J wire.  The catheter  was gently advanced over the left ventricle and the left ventricular  pressures were monitored.  Hand contrast of the left ventricle was performed  both in the LAO and RAO projection.  Catheter was flushed with saline and  pulled back into the ascending aorta.  Pressure gradient across the aortic  valve was monitored.  The right coronary artery was selectively engaged and  angiography was performed. Then the ascending aortogram was performed in the  LAO projection. Then a 6-French AR-AL1 catheter was utilized to engage the  left main coronary artery and angiography was repeated. Then the catheter  was pulled out of the body in the usual fashion. The right femoral  angiography was performed through the arterial access sheath and the access  was closed with Star Close with excellent hemostasis  obtained. The patient  tolerated the procedure well and no immediate complications were noted. A  total of 80 to 85 cc of contrast was utilized for the diagnostic  angiography.       JRG/MEDQ  D:  08/09/2004  T:  08/09/2004  Job:  [id number]

## 2010-05-31 NOTE — Op Note (Signed)
NAME:  AVIYA, JARVIE NO.:  [id number]   MEDICAL RECORD NO.:  [id number]                   PATIENT TYPE:  AMB   LOCATION:  DAY                                  FACILITY:  [facility]   PHYSICIAN:  Barbaraann Barthel, M.D.              DATE OF BIRTH:  [birth year]   DATE OF PROCEDURE:  09/13/2003  DATE OF DISCHARGE:                                 OPERATIVE REPORT   SURGEON:  Barbaraann Barthel, M.D.   PREOPERATIVE DIAGNOSIS:  Prolapsed and bleeding internal hemorrhoids.   POSTOPERATIVE DIAGNOSIS:  Prolapsed and bleeding internal hemorrhoids.   PROCEDURE:  1.  Rigid proctoscopy.  2.  Hemorrhoidectomy x2.   INDICATIONS FOR PROCEDURE:  This is a 50 year old white female who had  recurrent episodes of bleeding prolapsed internal hemorrhoids.  She had been  treated medically, and we opted for surgery.  We discussed the procedure,  including bleeding, infection, and the fact that more surgery may be  required.  Informed consent was obtained.   TECHNIQUE:  The patient was placed in a jackknife prone position.  After  adequate administration of spinal anesthesia, a digital examination was  performed.  A rigid proctoscopy was performed to 25 cm.  There were  prolapsed internal hemorrhoids noted.  No other abnormalities in the rectal  mucosa were appreciated other than the perianal scarring of perianal tags.   After rigid proctoscopy, the area was prepped with Betadine solution and  draped in the usual manner.  Prior to this, the buttocks were separated with  tape in order to provide a more visualized operative field.  We began with  the left lateral bundle and removed this using the Buie clamps and clamping  the hemorrhoids on the left lateral position after making a small incision  on the perianal verge to elevate this.  The Buie clamp was placed over this,  and this was oversewn using 2-0 gut sutures.  This was done overlapping the  Buie clamp from an internal to  an external direction and then returning to  an internal direction with on overlapping suture and ligating this with the  original knot.  Exactly the same was carried out another area on the right  posterior bundle where there were some rather large hemorrhoids in this  area.  These were likewise doubly oversewn.  We then checked for hemostasis,  irrigated with dilute saline and Betadine solution, and then used 0.5%  Sensorcaine for a perianal block.  We then placed viscous  Xylocaine gauze over the wound with an ABD pad.  Prior to closure, all  sponge, needle, and instrument counts were found to be correct.  Estimated  blood loss was minimal.   The patient tolerated the procedure well and was taken to the recovery room  in satisfactory condition.      ___________________________________________  Barbaraann Barthel, M.D.   WB/MEDQ  D:  09/13/2003  T:  09/13/2003  Job:  [id number]

## 2010-05-31 NOTE — Discharge Summary (Signed)
NAMEARRIA, NAIM             ACCOUNT NO.:  [id number]   MEDICAL RECORD NO.:  [id number]          PATIENT TYPE:  INP   LOCATION:  A227                          FACILITY:  [facility]   PHYSICIAN:  Margaretmary Dys, M.D.DATE OF BIRTH:  04/20/1960   DATE OF ADMISSION:  08/08/2004  DATE OF DISCHARGE:  07/28/2006LH                                 DISCHARGE SUMMARY   DISCHARGE DIAGNOSES:  1.  Chest pain. Rule out myocardial infarction.  2.  Dyspnea.   DISPOSITION:  The patient has been transferred to [facility] for  cardiac catheterization.   HOSPITAL COURSE:  Ms. Glean Salvo is a 50 year old Caucasian female who was  admitted on August 08, 2004, through the emergency room. She complained of  chest pressure that started earlier in the day. She says it felt like  somebody was sitting on her chest, the pain radiating to the left arm with  some numbness, with a severity of 9/10. She did receive two tablets of  nitroglycerin and morphine with some relief in the emergency room. At the  time of our exam her pain was down to 2/10. She does have a positive family  history of coronary artery disease with his father having bypass surgery in  his 45s, mother with balloon angioplasty in the 60s, and there is also a  positive family history of diabetes. She smokes about one pack a day and has  been smoking for over 30 years. In view of her multiple risk factors, as  indicated above, cardiology was called to see her.   The patient was also noted to have some wheezing while here in the hospital.  She does not carry a formal diagnosis or chronic obstructive pulmonary  disease, but she was put on atrovent and albuterol nebulizers.   She was also started on aspirin 81 mg p.o. daily.  On August 09, 2004, the  patient was seen by the cardiologist and there was concern about her overall  risk factors. Her EKG was essentially unchanged and telemetry monitor was  unremarkable. Cardiac enzymes were  negative. Chest x-ray showed some mild  bronchitic changes. It was advised due to her typical anginal pain, family  history, and smoking history to proceed with cardiac catheterization. The  benefits and risks were discussed at length with the husband. The patient  agreed to proceed with the cardiac catheterization, and she was subsequently  transferred to [facility] on August 09, 2004.   PERTINENT LABORATORY DATA:  HDL cholesterol was 47, LDL 126, total  cholesterol 185. Cardiac enzymes as noted above were negative. A 12-lead EKG  showed a normal sinus rhythm with poor R-wave progression. Chest x-ray only  revealed mild bronchitic changes.   He had a basic metabolic panel that was normal. Complete blood count was  also unremarkable.   DISPOSITION:  Transferred to [facility] for further cardiac  evaluation.      Margaretmary Dys, M.D.  Electronically Signed     AM/MEDQ  D:  09/05/2004  T:  09/05/2004  Job:  [id number]

## 2010-05-31 NOTE — H&P (Signed)
NAMEMINNAH, LLAMAS NO.:  [id number]   MEDICAL RECORD NO.:  [id number]          PATIENT TYPE:  AMB   LOCATION:  DAY                           FACILITY:  [facility]   PHYSICIAN:  Vickki Hearing, M.D.DATE OF BIRTH:  1960/09/17   DATE OF ADMISSION:  06/27/2005  DATE OF DISCHARGE:  LH                                HISTORY & PHYSICAL   Patient was referred to [place] by Dr. Franky Macho.   CHIEF COMPLAINT:  Mass on the left wrist.   She is 50 years old, right-hand dominant, complains of pain on the left  wrist over a small mass on the volar side near the FCR tendon.  She has had  it for two months.  She complains of decreased strength in the hand and  difficulties with activities of daily living with pain into the thenar  eminence.   She has a review of systems consistent with poor circulation, reflux,  headache, depression, anxiety, poor vision.  General breathing, urinary,  musculoskeletal, endocrine, skin, immunologic, and lymph systems were  normal.   She has a negative allergy history.  She has medical problems of only  depression and anxiety. Has had a hysterectomy, tubal, and appendectomy.   She takes Xanax.   FAMILY HISTORY:  Negative.   FAMILY PHYSICIAN:  Kirk Ruths, M.D.   She is married.  Work type:  None.  Smoking history:  One pack a day.  Alcohol:  None.  Caffeine use:  Daily.  Highest grade completed:  Nine.   PHYSICAL EXAMINATION:  VITAL SIGNS:  Weight 140, pulse 70, respiratory rate  18.  GENERAL APPEARANCE:  Normal development, grooming, hygiene, nutrition.  Body  habitus:  Ectomorphic.  CARDIOVASCULAR:  Normal pulse and perfusion.  No swelling, tenderness, or  change in temperature.  LYMPH:  Negative, including epitrochlear and axillary nodes.  SKIN:  Normal except for the mass over the volar ganglion on the left and on  the right side of her back just inferior to the scapula, there appears to be  an inclusion cyst which was  treated with expression of the fluid by Dr.  Lovell Sheehan, and then she was placed on antibiotics.  She may have this removed  at the same time I do the ganglion pending evaluation by Dr. Lovell Sheehan.  NEURO:  Normal sensation in the hand.  She is awake, alert and oriented x3.  Mood and affect are normal.  Reflexes are intact.  Gait and station are  normal.  The wrist has normal range of motion, strength, stability, and  alignment.  There is a tender mass in the volar side near the FCR tendon.  It does not move, but the tendon appears to be superficial to or coming from  the radial side of it.   IMPRESSION:  Ganglion cyst, left wrist.   PLAN:  Excision of ganglion cyst, left wrist.      Vickki Hearing, M.D.  Electronically Signed     SEH/MEDQ  D:  06/18/2005  T:  06/18/2005  Job:  [id number]   cc:   [facility]  Fax: [phone]

## 2010-05-31 NOTE — Op Note (Signed)
Tanya Velazquez, DICENSO             ACCOUNT NO.:  [id number]   MEDICAL RECORD NO.:  [id number]          PATIENT TYPE:  AMB   LOCATION:  DAY                           FACILITY:  [facility]   PHYSICIAN:  Vickki Hearing, M.D.DATE OF BIRTH:  [birth year]   DATE OF PROCEDURE:  06/27/2005  DATE OF DISCHARGE:                                 OPERATIVE REPORT   PREOPERATIVE DIAGNOSIS:  Volar ganglion left wrist.   POSTOPERATIVE DIAGNOSIS:  Volar ganglion left wrist.   PROCEDURE:  Excision of volar ganglion left wrist.   SURGEON:  Vickki Hearing, M.D.   ASSISTANT:  Wallis Mart.   ANESTHETIC:  Regional block, Bier block.   OPERATIVE FINDINGS:  Small volar ganglion from the radiocarpal joint.   INDICATIONS:  Pain; mass left wrist.   PROCEDURE DETAILS:  The patient was identified in the preoperative  holding  area.  She marked her left wrist as the surgical site and I countersigned  it.  I updated the history and physical.  She was given Ancef (1 gram) and  taken to the operating room for a Biel block. After successful Bier block,  the left upper extremity was prepped and draped in sterile technique.  The  time-out procedure was completed.  A longitudinal incision was made over the  mass; subcutaneous tissue was divided.  There were a lot of venous branches  around the mass; these were cauterized.  Blunt dissection was carried out  until the mass was removed intact.  The wound was irrigated.  Hemostasis was  controlled with electrocautery.  We injected 10 cc of plain Marcaine, closed  the wound with 2-0 Monocryl and 3-0 running nylon sutures.  Applied Steri-  Strips and a sterile dressing to the wound, and took the patient to the  recovery room in stable condition.      Vickki Hearing, M.D.  Electronically Signed     SEH/MEDQ  D:  06/27/2005  T:  06/27/2005  Job:  [id number]

## 2010-05-31 NOTE — Cardiovascular Report (Signed)
NAMESHADAVIA, DAMPIER NO.:  [id number]   MEDICAL RECORD NO.:  [id number]          PATIENT TYPE:  OIB   LOCATION:  2899                         FACILITY:  [facility]   PHYSICIAN:  Darlin Priestly, MD  DATE OF BIRTH:  04-20-1960   DATE OF PROCEDURE:  11/04/2004  DATE OF DISCHARGE:  11/04/2004                              CARDIAC CATHETERIZATION   PROCEDURE:  Heads-up tilt table testing with Isuprel infusion.   COMPLICATIONS:  None.   INDICATIONS:  Tanya Velazquez is a 49 year old female, a patient of Dr. Regino Schultze  and Dr. Domingo Sep, with a history of chest pain with subsequent cardiac  catheterization revealing no sign CAD with an EF of 60%. She has also had  recurrent syncopal episodes for the last several years, now occurring one to  two times a month. She is now referred for tilt testing to rule out  neurocardiogenic syncope.   DESCRIPTION OF PROCEDURE:  After informed consent the patient was brought to  the cardiac cath lab in a fasting lab. She was then placed in supine  position and hemodynamic measures obtained. Resting blood pressure was  113/67 with resting heart rate was 59. The patient was the  monitored for  five minutes. She was then tilted to 70-degree heads-up position for 30  minutes with no significant change in her heart rate or blood pressure. She  did complain of mild nausea and leg weakness as well as dizziness, but had  no syncopal episodes and no significant change in hemodynamic status. She  was returned to the supine position after 30 minutes and Isuprel infusion  was begun at 0.5 mcg per minute. This was ultimately titrated to 1 mcg per  minute. She was re-tilted to 70-degree position and monitored for  approximately 10 minutes. She had no significant change in her heart rate or  blood pressure throughout these episodes with a blood pressure of 119/53 and  heart rate of 115 during the infusion. She again did complain of some mild  leg weakness,  but had no presyncopal sensation. After 10 minutes, the  Isuprel was discontinued and the patient was returned to the supine  position. She remained hemodynamically stable. The patient was then  transferred to the recovery room in stable condition.   CONCLUSION:  Negative heads-up tilt table testing with Isuprel infusion.      Darlin Priestly, MD  Electronically Signed     RHM/MEDQ  D:  11/04/2004  T:  11/04/2004  Job:  [id number]   cc:   Dani Gobble, MD  Fax: [phone]   Kirk Ruths, M.D.  Fax: [phone]

## 2010-05-31 NOTE — H&P (Signed)
[name], Tanya Velazquez             ACCOUNT NO.:  [id number]   MEDICAL RECORD NO.:  [id number]          PATIENT TYPE:  INP   LOCATION:  A227                          FACILITY:  [facility]   PHYSICIAN:  Margaretmary Dys, M.D.DATE OF BIRTH:  05/22/1960   DATE OF ADMISSION:  08/08/2004  DATE OF DISCHARGE:  LH                                HISTORY & PHYSICAL   PRIMARY CARE PHYSICIAN:  None.   ADMISSION DIAGNOSES:  1.  Chest pain, rule out myocardial infarction.  2.  Dyspnea.   CHIEF COMPLAINT:  Shortness of breath with chest pressure today.   HISTORY OF PRESENT ILLNESS:  Tanya Velazquez is a 50 year old Caucasian female  who presented to the emergency room with complaints of chest pressure which  started up at 7 a.m. today. The patient said it felt like  someone was  sitting on her chest. It radiated to her left arm with some numbness. She  also had some  shortness of breath. She denies any diaphoresis. The pain  initially was about 9/10. After about five minutes, the pain went down to  about 5/10 but remained constant only to get worse again sometime in the  afternoon around 1 o'clock, that was when she decided to bring herself into  the emergency room. She arrived in the emergency room via a private vehicle  from a sister-in-law. She said the pain started while she was just sitting  down watching TV, it did not occur with exertion during the course of the  day. She did not exert herself because she was concerned about the chest  pain. On her arrival to the emergency room, she got two tablets of  nitroglycerin and morphine with some relief in  the pain , now down to 2/10  still located on the left side of her chest pain.   She denies any nausea, vomiting, diarrhea, abdominal pain.   She has no fever, chills or rigors, denies any precedent cough or shortness  of breath. The patient states she does have  intermittent wheezing in the  past, she smokes.   The patient is being admitted now  for further evaluation and management of  this chest pressure.   REVIEW OF SYMPTOMS:  Ten point review of systems was otherwise negative  except as mentioned in the history of present illness.   PAST MEDICAL HISTORY:  None of note.   PAST SURGICAL HISTORY:  The patient has had a hemorrhoidectomy x2.   MEDICATIONS:  None.   ALLERGIES:  CODEINE.   FAMILY HISTORY:  Positive for father with history of bypass surgery in his  26's and mother with balloon angioplasty in his 26's. She has two sisters  and a brother, one with hypertension, the other with diabetes. No family  history of cancers. Her siblings do not have cardiac disease as far as she  knows.   SOCIAL HISTORY:  The patient is married and has two children. She currently  works. She smokes about one pack a day and has been smoking for over 30  years. She denies any IV drug abuse, no cocaine use. She is  independent of  activities of daily living.   PHYSICAL EXAMINATION:  GENERAL:  She is alert, comfortable, not in acute  distress.  VITAL SIGNS:  Blood pressure was 111/72, pulse is 68, temperature 97.1.  Oxygen saturation 99% on room air.  HEENT:  Normocephalic, atraumatic. Oral mucosa was moist with no exudates.  NECK:  Supple, no JVD.  LUNGS:  Bilateral expiratory wheezes.  HEART:  S1, S2 regular. No S3, S4, gallops or rubs.  ABDOMEN:  Soft, nontender, bowel sounds positive. No masses palpable.  EXTREMITIES:  No pitting pedal edema, no calf induration or tenderness was  noted.  CNS:  Grossly intact with no focal deficits.   LABORATORY DATA:  Chest x-ray showed some mild bronchitis changes. A 12 lead  EKG is normal sinus rhythm with no acute STT changes. Cardiac enzymes were  negative.   White blood cell count 6.4, hemoglobin 14.2, hematocrit 40.8, platelet count  was 220. Sodium is 136, potassium 3.9, chloride of 103, CO2 is 24, glucose  of 95, BUN of 14, creatinine 0.8, calcium is 9.5. Cardiac enzymes were   negative.   ASSESSMENT/PLAN:  A 50 year old Caucasian female presenting with chest pain  which appears to be fairly typical. Plan will be to admit her to  telemetry  at this time. Would obtain serial cardiac enzymes and will give  nitroglycerin as needed for chest pain. If the patient's chest pain recurs,  I will start full anticoagulation on her and nitroglycrin infusion.   The patient received some nitro and aspirin in the emergency room. I have  requested [facility] to see her in the morning.   Will  check a fasting lipid profile on her.   She also has some wheezing, likely some degree of chronic obstructive  pulmonary disease from longstanding smoking. Will give her some albuterol  and atrovent nebs as needed. I have discussed the above plan with her and  her husband and they both verbalized full understanding. I also explained  the role of the hospitalist team to them.   CODE STATUS:  Full code.      AM/MEDQ  D:  08/08/2004  T:  08/08/2004  Job:  [id number]

## 2010-06-11 ENCOUNTER — Ambulatory Visit (HOSPITAL_COMMUNITY)
Admission: RE | Admit: 2010-06-11 | Discharge: 2010-06-11 | Disposition: A | Discharge status: Home / Self Care | Payer: Medicare Other | Source: Ambulatory Visit | Attending: Internal Medicine | Admitting: Internal Medicine

## 2010-06-11 ENCOUNTER — Other Ambulatory Visit (HOSPITAL_COMMUNITY): Payer: Self-pay | Admitting: Internal Medicine

## 2010-06-11 DIAGNOSIS — G8929 Other chronic pain: Secondary | ICD-10-CM

## 2010-06-11 DIAGNOSIS — Z139 Encounter for screening, unspecified: Secondary | ICD-10-CM

## 2010-06-13 ENCOUNTER — Ambulatory Visit (HOSPITAL_COMMUNITY)
Admission: RE | Admit: 2010-06-13 | Discharge: 2010-06-13 | Disposition: A | Discharge status: Home / Self Care | Payer: Medicare Other | Source: Ambulatory Visit | Attending: Internal Medicine | Admitting: Internal Medicine

## 2010-06-13 DIAGNOSIS — G8929 Other chronic pain: Secondary | ICD-10-CM

## 2010-06-13 DIAGNOSIS — I739 Peripheral vascular disease, unspecified: Secondary | ICD-10-CM | POA: Insufficient documentation

## 2010-06-13 DIAGNOSIS — Z139 Encounter for screening, unspecified: Secondary | ICD-10-CM

## 2010-06-20 ENCOUNTER — Ambulatory Visit (INDEPENDENT_AMBULATORY_CARE_PROVIDER_SITE_OTHER): Payer: Medicare Other | Admitting: Urgent Care

## 2010-06-20 ENCOUNTER — Encounter: Payer: Self-pay | Admitting: Urgent Care

## 2010-06-20 VITALS — BP 128/78 | HR 96 | Temp 98.2°F | Ht 62.0 in | Wt 178.2 lb

## 2010-06-20 DIAGNOSIS — Z800 Family history of malignant neoplasm of digestive organs: Secondary | ICD-10-CM

## 2010-06-20 DIAGNOSIS — K921 Melena: Secondary | ICD-10-CM

## 2010-06-20 DIAGNOSIS — K3184 Gastroparesis: Secondary | ICD-10-CM

## 2010-06-20 DIAGNOSIS — Z8719 Personal history of other diseases of the digestive system: Secondary | ICD-10-CM

## 2010-06-20 DIAGNOSIS — R1310 Dysphagia, unspecified: Secondary | ICD-10-CM | POA: Insufficient documentation

## 2010-06-20 NOTE — Progress Notes (Signed)
Referring Provider: Sherwood Gambler Primary Care Physician:  Sherwood Gambler Primary Gastroenterologist:  Dr. Jena Gauss  Chief Complaint  Patient presents with  . Constipation    HPI:  Tanya Velazquez is a 50 y.o. female here as a referral from Dr. Sherwood Gambler for a colonoscopy.  Mother dx colon ca age 50 this year.  She had colonoscopy over 3 yrs ago & wants another as she has had 3-4 mo hx of bright-burgundy blood in small on toilet paper & in commode.   She is quite concerned she may have colon cancer like her mother.  No lower abd pain.  BM once q 1-2 weeks.  Not taking anything for constipation.  Wt stable.  Appetite fluctuates.  Denies fever or chills.  Taking naprosyn BID for shoulder pain.  Also c/o refractory GERD, nocturnal symptoms.  Hx gastroparesis currently not on treatment.  C/o sore throat x 3-4 wks.  Solid food dysphagia, feels like stuck in mid-esophagus.  C/o odynophagia.  Wants EGD with possible dilation. Past Medical History  Diagnosis Date  . Diabetes mellitus   . COPD (chronic obstructive pulmonary disease)   . Migraines   . Osteoarthritis   . Pernicious anemia   . Depression   . Chronic pain   . Gastroparesis 07/2007  . GERD (gastroesophageal reflux disease) 04/13/07    EGD Dr Rourk->patulous EG junction, HH, antral erosions  . Constipation   . S/P colonoscopy 04/13/07    Dr Rourk->friable anal canal  . Dysphagia 2000    emoiric dilation  . Anxiety     Past Surgical History  Procedure Date  . Complete hysterectomy   . Cholecystectomy     Current Outpatient Prescriptions  Medication Sig Dispense Refill  . ALPRAZolam (XANAX) 1 MG tablet Take 1 mg by mouth at bedtime as needed.        Marland Kitchen amitriptyline (ELAVIL) 50 MG tablet       . aspirin 81 MG tablet Take 81 mg by mouth daily.        . citalopram (CELEXA) 10 MG tablet Take by mouth daily.       . enalapril (VASOTEC) 2.5 MG tablet       . Fluticasone-Salmeterol (ADVAIR DISKUS) 100-50 MCG/DOSE AEPB Inhale 1 puff into the lungs every  12 (twelve) hours.        Marland Kitchen glipiZIDE (GLUCOTROL) 5 MG tablet       . ibuprofen (ADVIL,MOTRIN) 200 MG tablet Take 200 mg by mouth every 6 (six) hours as needed.        . methocarbamol (ROBAXIN) 500 MG tablet Take 500 mg by mouth 4 (four) times daily.        . naproxen (NAPROSYN) 500 MG tablet       . omeprazole (PRILOSEC) 40 MG capsule       . oxycodone (ROXICODONE) 30 MG immediate release tablet       . PROAIR HFA 108 (90 BASE) MCG/ACT inhaler       . DISCONTD: doxycycline (VIBRA-TABS) 100 MG tablet         Allergies as of 06/20/2010 - Review Complete 06/20/2010  Allergen Reaction Noted  . Amicinonide-benzyl alcohol (cyclocort)  06/20/2010  . Doxycycline  06/20/2010  . Paxil  04/08/2010  . Trimethoprim  06/20/2010    Family History  Problem Relation Age of Onset  . Colon cancer Mother 8  . Alzheimer's disease Father   . Hypertension Father     History   Social History  . Marital Status: Married  Spouse Name: N/A    Number of Children: 3  . Years of Education: N/A   Occupational History  . unemployed    Social History Main Topics  . Smoking status: Current Everyday Smoker -- 1.0 packs/day for 33 years    Types: Cigarettes  . Smokeless tobacco: Not on file  . Alcohol Use: No  . Drug Use: No  . Sexually Active: Not on file   Other Topics Concern  . Not on file   Social History Narrative  . No narrative on file    Review of Systems: Gen: Denies any fever, chills, sweats, anorexia, fatigue, weakness, malaise, weight loss, and sleep disorder CV: Denies chest pain, angina, palpitations, syncope, orthopnea, PND, peripheral edema, and claudication. Resp: Denies dyspnea at rest, dyspnea with exercise, cough, sputum, wheezing, coughing up blood, and pleurisy. GI: Denies vomiting blood, jaundice, and fecal incontinence.    GU : Denies urinary burning, blood in urine, urinary frequency, urinary hesitancy, nocturnal urination, and urinary incontinence. MS: Denies joint  pain, limitation of movement, and swelling, stiffness, low back pain, extremity pain. Denies muscle weakness, cramps, atrophy.  Derm: Denies rash, itching, dry skin, hives, moles, warts, or unhealing ulcers.  Psych: Denies depression, anxiety, memory loss, suicidal ideation, hallucinations, paranoia, and confusion. Heme: Denies bruising, bleeding, and enlarged lymph nodes.  Physical Exam: BP 128/78  Pulse 96  Temp(Src) 98.2 F (36.8 C) (Temporal)  Ht 5\' 2"  (1.575 m)  Wt 178 lb 3.2 oz (80.831 kg)  BMI 32.59 kg/m2 General:   Alert,  Well-developed, well-nourished, pleasant and cooperative in NAD Head:  Normocephalic and atraumatic. Eyes:  Sclera clear, no icterus.   Conjunctiva pink. Ears:  Normal auditory acuity. Nose:  No deformity, discharge,  or lesions. Mouth:  No deformity or lesions, dentition normal. Neck:  Supple; no masses or thyromegaly. Lungs:  Clear throughout to auscultation.   No wheezes, crackles, or rhonchi. No acute distress. Heart:  Regular rate and rhythm; no murmurs, clicks, rubs,  or gallops. Abdomen:  Soft, nontender and nondistended. No masses, hepatosplenomegaly or hernias noted. Normal bowel sounds, without guarding, and without rebound.   Rectal:  Deferred until time of colonoscopy.   Msk:  Symmetrical without gross deformities. Normal posture. Pulses:  Normal pulses noted. Extremities:  Without clubbing or edema. Neurologic:  Alert and  oriented x4;  grossly normal neurologically. Skin:  Intact without significant lesions or rashes. Cervical Nodes:  No significant cervical adenopathy. Psych:  Alert and cooperative. Normal mood and affect.

## 2010-06-20 NOTE — Assessment & Plan Note (Signed)
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  Refractory symptoms on PPI.  Will have EGD at the same time as colonoscopy.

## 2010-06-20 NOTE — Assessment & Plan Note (Signed)
Tanya Velazquez is a 50 y.o. female w/ persistent hematochezia.  Quite concerned given the fact her mother was diagnosed w/ colon cancer this year.  Last colonoscopy over 3 yrs ago.  Will need colonscopy to determine etiology bleeding to r/o colorectal ca or polyp, NSAID-induced colopathy/enteropathy vs. Benign anorectal bleeding including fissure or hemorrhoids.  I have discussed risks & benefits which include, but are not limited to, bleeding, infection, perforation & drug reaction.  The patient agrees with this plan & written consent will be obtained.   Procedure will need to be done with deep sedation (propofol) in the OR under the direction of anesthesia services for hx of multiple psychoactive meds.

## 2010-06-20 NOTE — Assessment & Plan Note (Signed)
Mother dx age 50 this year

## 2010-06-20 NOTE — Assessment & Plan Note (Addendum)
Will have EGD with possible esophageal dilation at the same time given refractory GERD & dysphagia.

## 2010-06-20 NOTE — Assessment & Plan Note (Signed)
Untreated at this time.  May be exacerbating GERD nocturnal symptoms.

## 2010-06-21 NOTE — Progress Notes (Signed)
Cc to PCP 

## 2010-06-25 ENCOUNTER — Encounter (HOSPITAL_COMMUNITY): Payer: Medicare Other

## 2010-06-26 ENCOUNTER — Encounter (HOSPITAL_COMMUNITY): Payer: Medicare Other

## 2010-06-26 ENCOUNTER — Other Ambulatory Visit: Payer: Self-pay | Admitting: Internal Medicine

## 2010-06-26 ENCOUNTER — Encounter (HOSPITAL_BASED_OUTPATIENT_CLINIC_OR_DEPARTMENT_OTHER)
Admission: RE | Admit: 2010-06-26 | Discharge: 2010-06-26 | Disposition: A | Discharge status: Home / Self Care | Payer: Medicare Other | Source: Ambulatory Visit | Attending: Orthopedic Surgery | Admitting: Orthopedic Surgery

## 2010-06-26 LAB — BASIC METABOLIC PANEL
BUN: 12 mg/dL (ref 6–23)
CO2: 30 mEq/L (ref 19–32)
Calcium: 10.6 mg/dL — ABNORMAL HIGH (ref 8.4–10.5)
GFR calc non Af Amer: >60 mL/min (ref >60)
Glucose, Bld: 107 mg/dL — ABNORMAL HIGH (ref 70–99)
Glucose, Bld: 53 mg/dL — ABNORMAL LOW (ref 70–99)
Potassium: 4 mEq/L (ref 3.5–5.1)
Potassium: 4.7 mEq/L (ref 3.5–5.1)
Sodium: 138 mEq/L (ref 135–145)

## 2010-06-27 ENCOUNTER — Encounter: Payer: Medicare Other | Admitting: Internal Medicine

## 2010-06-27 ENCOUNTER — Other Ambulatory Visit: Payer: Self-pay | Admitting: Internal Medicine

## 2010-06-27 ENCOUNTER — Ambulatory Visit (HOSPITAL_COMMUNITY)
Admission: RE | Admit: 2010-06-27 | Discharge: 2010-06-27 | Disposition: A | Discharge status: Home / Self Care | Payer: Medicare Other | Source: Ambulatory Visit | Attending: Internal Medicine | Admitting: Internal Medicine

## 2010-06-27 DIAGNOSIS — K921 Melena: Secondary | ICD-10-CM

## 2010-06-27 DIAGNOSIS — Z01812 Encounter for preprocedural laboratory examination: Secondary | ICD-10-CM | POA: Insufficient documentation

## 2010-06-27 DIAGNOSIS — R1310 Dysphagia, unspecified: Secondary | ICD-10-CM | POA: Insufficient documentation

## 2010-06-27 DIAGNOSIS — K635 Polyp of colon: Secondary | ICD-10-CM

## 2010-06-27 DIAGNOSIS — Z01818 Encounter for other preprocedural examination: Secondary | ICD-10-CM | POA: Insufficient documentation

## 2010-06-27 DIAGNOSIS — Z01810 Encounter for preprocedural cardiovascular examination: Secondary | ICD-10-CM | POA: Insufficient documentation

## 2010-06-27 DIAGNOSIS — D126 Benign neoplasm of colon, unspecified: Secondary | ICD-10-CM | POA: Insufficient documentation

## 2010-06-27 DIAGNOSIS — K219 Gastro-esophageal reflux disease without esophagitis: Secondary | ICD-10-CM | POA: Insufficient documentation

## 2010-06-27 DIAGNOSIS — J449 Chronic obstructive pulmonary disease, unspecified: Secondary | ICD-10-CM | POA: Insufficient documentation

## 2010-06-27 DIAGNOSIS — J4489 Other specified chronic obstructive pulmonary disease: Secondary | ICD-10-CM | POA: Insufficient documentation

## 2010-06-27 DIAGNOSIS — Z800 Family history of malignant neoplasm of digestive organs: Secondary | ICD-10-CM | POA: Insufficient documentation

## 2010-06-27 HISTORY — DX: Polyp of colon: K63.5

## 2010-06-27 HISTORY — PX: ESOPHAGOGASTRODUODENOSCOPY: SHX1529

## 2010-06-27 LAB — GLUCOSE, CAPILLARY: Glucose-Capillary: 120 mg/dL — ABNORMAL HIGH (ref 70–99)

## 2010-06-28 ENCOUNTER — Ambulatory Visit (HOSPITAL_BASED_OUTPATIENT_CLINIC_OR_DEPARTMENT_OTHER)
Admission: RE | Admit: 2010-06-28 | Discharge: 2010-06-28 | Disposition: A | Discharge status: Home / Self Care | Payer: Medicare Other | Source: Ambulatory Visit | Attending: Orthopedic Surgery | Admitting: Orthopedic Surgery

## 2010-06-28 DIAGNOSIS — F17200 Nicotine dependence, unspecified, uncomplicated: Secondary | ICD-10-CM | POA: Insufficient documentation

## 2010-06-28 DIAGNOSIS — F411 Generalized anxiety disorder: Secondary | ICD-10-CM | POA: Insufficient documentation

## 2010-06-28 DIAGNOSIS — M25819 Other specified joint disorders, unspecified shoulder: Secondary | ICD-10-CM | POA: Insufficient documentation

## 2010-06-28 DIAGNOSIS — Z01812 Encounter for preprocedural laboratory examination: Secondary | ICD-10-CM | POA: Insufficient documentation

## 2010-06-28 DIAGNOSIS — M19019 Primary osteoarthritis, unspecified shoulder: Secondary | ICD-10-CM | POA: Insufficient documentation

## 2010-06-28 DIAGNOSIS — M719 Bursopathy, unspecified: Secondary | ICD-10-CM | POA: Insufficient documentation

## 2010-06-28 DIAGNOSIS — K219 Gastro-esophageal reflux disease without esophagitis: Secondary | ICD-10-CM | POA: Insufficient documentation

## 2010-06-28 DIAGNOSIS — J4489 Other specified chronic obstructive pulmonary disease: Secondary | ICD-10-CM | POA: Insufficient documentation

## 2010-06-28 DIAGNOSIS — M67919 Unspecified disorder of synovium and tendon, unspecified shoulder: Secondary | ICD-10-CM | POA: Insufficient documentation

## 2010-06-28 DIAGNOSIS — J449 Chronic obstructive pulmonary disease, unspecified: Secondary | ICD-10-CM | POA: Insufficient documentation

## 2010-06-28 HISTORY — PX: OTHER SURGICAL HISTORY: SHX169

## 2010-06-28 LAB — GLUCOSE, CAPILLARY: Glucose-Capillary: 95 mg/dL (ref 70–99)

## 2010-07-01 NOTE — Op Note (Signed)
NAMEMARNEY, TRELOAR NO.:  [id number]  MEDICAL RECORD NO.:  [id number]  LOCATION:                                 FACILITY:  PHYSICIAN:  Eulas Post, MD    DATE OF BIRTH:  1960-09-24  DATE OF PROCEDURE:  06/28/2010 DATE OF DISCHARGE:                              OPERATIVE REPORT   ATTENDING SURGEON:  Eulas Post, MD.  FIRST ASSISTANT:  Janace Litten, OPA.  PREOPERATIVE DIAGNOSES:  Right shoulder impingement syndrome, AC joint arthritis, question rotator cuff tear.  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  OPERATIVE PROCEDURE:  Right shoulder arthroscopy with acromioplasty and distal clavicle resection with limited debridement of the undersurface of the rotator cuff and bursectomy in the subacromial space and limited debridement of the labrum.  PREOPERATIVE INDICATIONS:  [name] is a 50 year old woman who had ongoing right shoulder pain and pain that went all around her upper extremity as well.  She failed conservative measures and elected to undergo arthroscopic treatment.  The risks, benefits, and alternatives were discussed with her before the procedure including, but not limited to risks of infection, bleeding, nerve injury, recurrent shoulder pain, incomplete relief of symptoms, progression of rotator cuff disease, stiffness, cardiopulmonary complications, among others, and she is willing to proceed.  OPERATIVE FINDINGS:  The biceps tendon and superior labrum was intact, although, there was some fraying of the labrum superiorly as well as anteriorly.  Subscapularis also had fraying at its insertion, but was 90% intact.  There was no detachment from the bone.  The glenohumeral articular cartilage was normal.  She had full motion under exam under anesthesia.  The  undersurface of the rotator cuff had about a 10% fraying and tendinopathy, which was debrided.  The subacromial space had CA ligament fraying as well as substantial bursitis.  The bursa was hypertrophic and thickened.  The supraspinatus tendon itself was intact and completely attached to the bone, although there was a weak area in the anterior aspect of the tendon, although, this was not full- thickness, and probing, I could not get this to tear, and so I felt that it was probably about 25% of the superior surface tendinopathy of dislocation.  Combined, there was probably at least 60% or 70% of the tendon still attached to the bone, so I did not take the tendon down and repair it.  OPERATIVE PROCEDURE:  The patient was brought to the operating room, placed in the supine position.  IV antibiotics were given.  General anesthesia was administered.  Regional block had also been given.  The right upper extremity was examined and had full motion.  Diagnostic arthroscopy was carried out after a sterile prep and drape and time-out. The above-named findings were noted.  The arthroscopic shaver was used to debride the labrum as well as the undersurface of the rotator cuff.  I then went to subacromial space.  Complete bursectomy was performed. The tendon quality and tissue quality was overall mediocre to poor. Nonetheless, the tendon did appear to be securely attached to the bone and so I  did not take it down.  I did release the CA ligament and perform a moderate acromioplasty as well as distal clavicle resection using the bur.  Confirmation of appropriate resection was made on multiple views.  I then removed the arthroscopic instruments and then closed the portals with Monocryl followed by Steri-Strips and sterile gauze.  The patient was awakened and extubated and returned to the PACU in stable satisfactory condition.  There were no complications and she tolerated the procedure  well.     Eulas Post, MD     JPL/MEDQ  D:  06/28/2010  T:  06/28/2010  Job:  [id number]  Electronically Signed by Teryl Lucy MD on 07/01/2010 05:02:38 PM

## 2010-07-02 NOTE — Progress Notes (Signed)
Cc to PCP 

## 2010-07-29 NOTE — Op Note (Signed)
Tanya Velazquez, Tanya Velazquez             ACCOUNT NO.:  [id number]  MEDICAL RECORD NO.:  [id number]  LOCATION:  DAYP                          FACILITY:  [facility]  PHYSICIAN:  R. Roetta Sessions, MD FACP FACGDATE OF BIRTH:  10/19/1960  DATE OF PROCEDURE:  06/27/2010 DATE OF DISCHARGE:                              OPERATIVE REPORT   INDICATIONS FOR PROCEDURE:  A 50 year old lady with longstanding GERD, recurrent esophageal dysphagia, also intermittent hematochezia, positive family history of colon cancer in mother.  EGD and colonoscopy is now being done, potential for signs of esophageal dilation, etc. reviewed. Please see the documentation in the medical record.  PROCEDURE NOTE:  O2 saturation, blood pressure, pulse and respirations were monitored throughout the entirety of the procedures.  Because of difficulties with sedation, she is being done under propofol with Dr. Jayme Cloud and assistance.  Cetacaine spray for topical pharyngeal anesthesia.  INSTRUMENT:  Pentax video chip system.  FINDINGS:  Examination of the tubular esophagus revealed a Schatzki ring, otherwise esophageal mucosa appeared normal.  EGD junction easily traversed.  Stomach:  Gastric cavity was empty and insufflated well with air.  Thorough examination of the gastric mucosa including retroflexed view of the proximal stomach, esophagogastric junction demonstrated a small hiatal hernia, couple tiny antral erosions otherwise gastric mucosa appeared normal.  Pylorus was patent and easily traversed. Examination of the bulb and second portion revealed no abnormalities.  THERAPEUTIC/DIAGNOSTIC MANEUVERS PERFORMED:  Scope was withdrawn and a 56-French Maloney dilators were passed to full insertion with mild resistance upon full insertion.  Look back revealed no apparent complication related to passage of the dilator.  The patient tolerated the procedure well and was prepared for colonoscopy.  Digital rectal exam revealed  probably a circumferential hemorrhoidal plexus, otherwise negative.  Endoscopic findings:  Prep was marginal.  Colon:  Colonic mucosa was surveyed from the rectosigmoid junction through the left transverse right colon to the appendiceal orifice, ileocecal valve/cecum.  These structures were well seen and photographed for the record.  From this level, the scope was slowly and cautiously withdrawn and all previously mentioned mucosal surfaces were again seen.  The patient had a long redundant colon prep was marginal quite a bit of wash and lavage suctioning had to be performed all the way indicating adequate visualization aside from a single 5-mm polyp in the mid sigmoid colon which was cold snared.  The remainder of the colonic mucosa appeared normal.  Scope was pulled down into the rectum, where a thorough examination of the rectal mucosa including retroflexion view of the anal verge demonstrated no other abnormalities.  Cecal withdrawal time 14 minutes.  The patient tolerated both procedures well, was reactive to endoscopy.  IMPRESSION:  EGD, Schatzki ring otherwise normal esophagus, hiatal hernia, tiny antral erosions of doubtful clinical significance otherwise normal stomach, patent pylorus, normal D1 and D2.  COLONOSCOPY FINDINGS:  Prominent external hemorrhoidal plexus likely source of hematochezia, otherwise normal rectum, long tortuous colon, single polyp sigmoid segment status post cold snare.  RECOMMENDATIONS: 1. GERD, hiatal hernia.  Polyp and hemorrhoid literature provided to     Tanya Velazquez. 2. Anusol-HC cream applied to the anus q.i.d., continue omeprazole 40     mg  orally daily. 3. The patient has significant external hemorrhoids and may likely     need definitive surgical intervention and to take care of this     problem. 4. Further recommendations to follow.     Jonathon Bellows, MD FACP [facility]     RMR/MEDQ  D:  06/27/2010  T:  06/27/2010  Job:  [id number]  cc:    Tanya Rear. Sherwood Gambler, MD Fax: [phone]  Electronically Signed by Lorrin Goodell M.D. on 07/29/2010 09:12:42 AM

## 2010-08-08 ENCOUNTER — Encounter (HOSPITAL_BASED_OUTPATIENT_CLINIC_OR_DEPARTMENT_OTHER)
Admission: RE | Admit: 2010-08-08 | Discharge: 2010-08-08 | Disposition: A | Discharge status: Home / Self Care | Payer: Medicare Other | Source: Ambulatory Visit | Attending: Orthopedic Surgery | Admitting: Orthopedic Surgery

## 2010-08-08 LAB — BASIC METABOLIC PANEL
GFR calc Af Amer: >60 mL/min (ref >60)
GFR calc non Af Amer: >60 mL/min (ref >60)
Potassium: 3.9 mEq/L (ref 3.5–5.1)
Sodium: 139 mEq/L (ref 135–145)

## 2010-08-13 ENCOUNTER — Ambulatory Visit (HOSPITAL_BASED_OUTPATIENT_CLINIC_OR_DEPARTMENT_OTHER)
Admission: RE | Admit: 2010-08-13 | Discharge: 2010-08-13 | Disposition: A | Discharge status: Home / Self Care | Payer: Medicare Other | Source: Ambulatory Visit | Attending: Orthopedic Surgery | Admitting: Orthopedic Surgery

## 2010-08-13 DIAGNOSIS — M719 Bursopathy, unspecified: Secondary | ICD-10-CM | POA: Insufficient documentation

## 2010-08-13 DIAGNOSIS — M25819 Other specified joint disorders, unspecified shoulder: Secondary | ICD-10-CM | POA: Insufficient documentation

## 2010-08-13 DIAGNOSIS — M19019 Primary osteoarthritis, unspecified shoulder: Secondary | ICD-10-CM | POA: Insufficient documentation

## 2010-08-13 DIAGNOSIS — Z01812 Encounter for preprocedural laboratory examination: Secondary | ICD-10-CM | POA: Insufficient documentation

## 2010-08-13 DIAGNOSIS — M67919 Unspecified disorder of synovium and tendon, unspecified shoulder: Secondary | ICD-10-CM | POA: Insufficient documentation

## 2010-08-13 HISTORY — PX: SHOULDER ARTHROSCOPY: SHX128

## 2010-08-13 LAB — GLUCOSE, CAPILLARY: Glucose-Capillary: 113 mg/dL — ABNORMAL HIGH (ref 70–99)

## 2010-08-13 LAB — POCT HEMOGLOBIN-HEMACUE: Hemoglobin: 15.2 g/dL — ABNORMAL HIGH (ref 12.0–15.0)

## 2010-08-15 NOTE — Op Note (Addendum)
NAMEFLORDIA, KASSEM NO.:  [id number]  MEDICAL RECORD NO.:  [id number]  LOCATION:                                 FACILITY:  PHYSICIAN:  Eulas Post, MD    DATE OF BIRTH:  Dec 22, 1960  DATE OF PROCEDURE:  08/13/2010 DATE OF DISCHARGE:                              OPERATIVE REPORT   ATTENDING SURGEON:  Eulas Post, MD  FIRST ASSISTANT:  Dr. Weyman Croon.  SECOND ASSISTANT:  Janace Litten, orthopedic PA-C  PREOPERATIVE DIAGNOSES:  Left shoulder impingement syndrome, acromioclavicular joint degenerative joint disease, and labrum fraying.  POSTOPERATIVE DIAGNOSIS:  Left shoulder impingement syndrome, acromioclavicular joint degenerative joint disease, and labrum fraying.  OPERATIVE PROCEDURE:  Left shoulder arthroscopy with acromioplasty, distal clavicle resection, and debridement of labrum.  ANESTHESIA:  General with a regional block.  ESTIMATED BLOOD LOSS:  Minimal.  PREOPERATIVE INDICATIONS:  Ms. Courteney Alderete is a 50 year old woman who has had previous right shoulder arthroscopy with acromioplasty and distal clavicle resection done about 2 months ago who reported dramatic improvement in symptoms on the right side.  She had identical symptoms along with a remarkably similar MRI on the left side.  She failed conservative measures and elected to undergo arthroscopic treatment. The risks, benefits, and alternatives were discussed for the procedure including but not limited to risks of infection, bleeding, nerve injury, stiffness, loss of function, progression of rotator cuff disease, cardiopulmonary complications, incomplete relief of pain, among others, and he is willing to proceed.  OPERATIVE FINDINGS:  The findings on the left shoulder were almost identical to those on the right.  The articular joint surface was intact.  She had some mild fraying of the undersurface of the superior labrum.  She had fraying anteriorly of the labrum as well.   She had subscapularis fraying at the insertion, but this was only a very small portion of the fibers, and the majority of the fibers of the biceps pulley in the subscapularis insertion was intact.  The undersurface of the rotator cuff had about 10% tear on the undersurface, and had high- grade tendinopathy throughout, but no exposed bone.  She had thickened bursa in the subacromial space.  The infraspinatus was intact.  There was moderate spurring of the undersurface of this acromion as well as degenerative changes in the [facility] joint.  The rotator cuff did appear to be intact, despite the tendinopathy and poor tendon quality, it was still attached to bone over 75%, so I did not take this down.  OPERATIVE PROCEDURE:  The patient was brought to the operating room, placed in supine position.  IV antibiotics were given.  Regional block had been given.  General anesthesia was administered.  She was turned in the semilateral decubitus position and examination under anesthesia demonstrated full motion.  Diagnostic arthroscopy was carried out after sterile prep and drape and a time-out was performed.  The above-named findings were noted.  The arthroscopic shaver was used to debride the anterior and the superior labrum.  I also debrided the undersurface of the rotator cuff.  After this had been completed, I turned my attention to subacromial space.  The biceps tendon within the joint was normal.  There was a little bit of injection, but overall no significant tendinopathy.  In the subacromial space, I performed a bursectomy, so I debrided out the bursa, and debrided out any loose, frayed portions of the tendon.  I released the CA ligament and performed an acromioplasty with the bur confirming from AP and lateral views.  I also resected the distal clavicle confirming from both lateral and anterior views.  The shoulder was irrigated copiously and the instruments removed and the portals closed with  Monocryl followed by Steri-Strips and sterile gauze.  She was awakened and returned to the PACU in stable and satisfactory condition.  There were no complications and she tolerated the procedure well.     Eulas Post, MD     JPL/MEDQ  D:  08/13/2010  T:  08/13/2010  Job:  [id number]  Electronically Signed by Teryl Lucy MD on 08/22/2010 10:23:43 AM

## 2010-10-04 LAB — CBC
RBC: 4.08
WBC: 4.6

## 2010-10-04 LAB — BASIC METABOLIC PANEL
Calcium: 9.9
Creatinine, Ser: 0.85
GFR calc Af Amer: >60

## 2010-10-07 LAB — BASIC METABOLIC PANEL
BUN: 11
CO2: 31
Calcium: 9.9
Creatinine, Ser: 0.8
GFR calc Af Amer: >60

## 2010-10-07 LAB — DIFFERENTIAL
Basophils Absolute: 0.1
Basophils Relative: 1
Monocytes Relative: 6
Neutro Abs: 5.7
Neutrophils Relative %: 56

## 2010-10-07 LAB — POCT CARDIAC MARKERS
Myoglobin, poc: 41.9
Operator id: 208461
Troponin i, poc: <0.05

## 2010-10-07 LAB — CBC
MCHC: 34.8
Platelets: 311
RBC: 4.1

## 2010-11-04 ENCOUNTER — Other Ambulatory Visit (HOSPITAL_COMMUNITY): Payer: Self-pay | Admitting: Internal Medicine

## 2010-11-04 DIAGNOSIS — R6889 Other general symptoms and signs: Secondary | ICD-10-CM

## 2010-11-06 ENCOUNTER — Ambulatory Visit (HOSPITAL_COMMUNITY): Admission: RE | Admit: 2010-11-06 | Payer: Medicare Other | Source: Ambulatory Visit

## 2010-11-13 ENCOUNTER — Ambulatory Visit (HOSPITAL_COMMUNITY): Payer: Medicare Other

## 2010-12-30 ENCOUNTER — Other Ambulatory Visit (HOSPITAL_COMMUNITY): Payer: Self-pay | Admitting: Internal Medicine

## 2010-12-31 ENCOUNTER — Other Ambulatory Visit (HOSPITAL_COMMUNITY): Payer: Self-pay | Admitting: Internal Medicine

## 2010-12-31 DIAGNOSIS — K219 Gastro-esophageal reflux disease without esophagitis: Secondary | ICD-10-CM

## 2011-01-02 ENCOUNTER — Ambulatory Visit (HOSPITAL_COMMUNITY)
Admission: RE | Admit: 2011-01-02 | Discharge: 2011-01-02 | Disposition: A | Discharge status: Home / Self Care | Payer: Medicare Other | Source: Ambulatory Visit | Attending: Internal Medicine | Admitting: Internal Medicine

## 2011-01-02 ENCOUNTER — Other Ambulatory Visit (HOSPITAL_COMMUNITY): Payer: Self-pay | Admitting: Internal Medicine

## 2011-01-02 DIAGNOSIS — K219 Gastro-esophageal reflux disease without esophagitis: Secondary | ICD-10-CM

## 2011-01-08 ENCOUNTER — Ambulatory Visit (HOSPITAL_COMMUNITY): Payer: Medicare Other

## 2011-01-14 HISTORY — PX: ESOPHAGEAL MANOMETRY: SHX1526

## 2011-01-14 HISTORY — PX: OTHER SURGICAL HISTORY: SHX169

## 2011-01-14 HISTORY — PX: STOMACH SURGERY: SHX791

## 2011-01-30 ENCOUNTER — Other Ambulatory Visit (HOSPITAL_COMMUNITY): Payer: Self-pay | Admitting: Internal Medicine

## 2011-01-30 DIAGNOSIS — K219 Gastro-esophageal reflux disease without esophagitis: Secondary | ICD-10-CM

## 2011-02-04 ENCOUNTER — Ambulatory Visit (HOSPITAL_COMMUNITY)
Admission: RE | Admit: 2011-02-04 | Discharge: 2011-02-04 | Disposition: A | Discharge status: Home / Self Care | Payer: Medicare Other | Source: Ambulatory Visit | Attending: Internal Medicine | Admitting: Internal Medicine

## 2011-02-04 DIAGNOSIS — K219 Gastro-esophageal reflux disease without esophagitis: Secondary | ICD-10-CM

## 2011-02-04 MED ORDER — DIPHENHYDRAMINE HCL 50 MG/ML IJ SOLN
INTRAMUSCULAR | Status: AC
  Filled 2011-02-04: qty 1

## 2011-03-10 ENCOUNTER — Ambulatory Visit (INDEPENDENT_AMBULATORY_CARE_PROVIDER_SITE_OTHER): Payer: Medicare Other | Admitting: Urgent Care

## 2011-03-10 ENCOUNTER — Encounter: Payer: Self-pay | Admitting: Urgent Care

## 2011-03-10 DIAGNOSIS — R1310 Dysphagia, unspecified: Secondary | ICD-10-CM

## 2011-03-10 DIAGNOSIS — Z8719 Personal history of other diseases of the digestive system: Secondary | ICD-10-CM

## 2011-03-10 DIAGNOSIS — K3184 Gastroparesis: Secondary | ICD-10-CM

## 2011-03-10 DIAGNOSIS — Z800 Family history of malignant neoplasm of digestive organs: Secondary | ICD-10-CM

## 2011-03-10 MED ORDER — DEXLANSOPRAZOLE 60 MG PO CPDR: DELAYED_RELEASE_CAPSULE | ORAL | Status: DC

## 2011-03-10 MED ORDER — METOCLOPRAMIDE HCL 5 MG PO TABS: ORAL_TABLET | ORAL | Status: DC

## 2011-03-10 NOTE — Patient Instructions (Signed)
Stop the omeprazole and pantoprazole. Begin Dexilant 60 mg daily 30 minutes before dinner. Begin Reglan 5 mg 30 minutes before breakfast, lunch, dinner, and bedtime. Call if any side effects. 1-800-quit-now for help quitting smoking. Keep her blood sugars and if control can be sure you work with Dr. Cassell Smiles., MD, MD on this. Gastroparesis diet, step 1 for 2 days, then advance as tolerated  Gastroparesis  Gastroparesis is also called slowed stomach emptying (delayed gastric emptying). It is a condition in which the stomach takes too long to empty its contents. It often happens in people with diabetes.  CAUSES  Gastroparesis happens when nerves to the stomach are damaged or stop working. When the nerves are damaged, the muscles of the stomach and intestines do not work normally. The movement of food is slowed or stopped. High blood glucose (sugar) causes changes in nerves and can damage the blood vessels that carry oxygen and nutrients to the nerves. RISK FACTORS  Diabetes.   Post-viral syndromes.   Eating disorders (anorexia, bulimia).   Surgery on the stomach or vagus nerve.   Gastroesophageal reflux disease (rarely).   Smooth muscle disorders (amyloidosis, scleroderma).   Metabolic disorders, including hypothyroidism.   Parkinson's disease.  SYMPTOMS   Heartburn.   Feeling sick to your stomach (nausea).   Vomiting of undigested food.   An early feeling of fullness when eating.   Weight loss.   Abdominal bloating.   Erratic blood glucose levels.   Lack of appetite.   Gastroesophageal reflux.   Spasms of the stomach wall.  Complications can include:  Bacterial overgrowth in stomach. Food stays in the stomach and can ferment and cause bacteria to grow.   Weight loss due to difficulty digesting and absorbing nutrients.   Vomiting.   Obstruction in the stomach. Undigested food can harden and cause nausea and vomiting.   Blood glucose fluctuations  caused by inconsistent food absorption.  DIAGNOSIS  The diagnosis of gastroparesis is confirmed through one or more of the following tests:  Barium X-rays and scans. These tests look at how long it takes for food to move through the stomach.   Gastric manometry. This test measures electrical and muscular activity in the stomach. A thin tube is passed down the throat into the stomach. The tube contains a wire that takes measurements of the stomach's electrical and muscular activity as it digests liquids and solid food.   Endoscopy. This procedure is done with a long, thin tube called an endoscope. It is passed through the mouth and gently guides down the esophagus into the stomach. This tube helps the caregiver look at the lining of the stomach to check for any abnormalities.   Ultrasound. This can rule out gallbladder disease or pancreatitis. This test will outline and define the shape of the gallbladder and pancreas.  TREATMENT   The primary treatment is to identify the problem and help control blood glucose levels. Treatments include:   Exercise.   Medicines to control nausea and vomiting.   Medicines to stimulate stomach muscles.   Changes in what and when you eat.   Having smaller meals more often.   Eating low-fiber forms of high-fiber foods, such aseating cooked vegetables instead of raw vegetables.   Eating low-fat foods.   Consuming liquids, which are easier to digest.   In severe cases, feeding tubes and intravenous (IV) feeding may be needed.  It is important to note that in most cases, treatment does not cure gastroparesis. It is usually  a lasting (chronic) condition. Treatment helps you manage the condition so that you can be as healthy and comfortable as possible. NEW TREATMENTS  A gastric neurostimulator has been developed to assist people with gastroparesis. The battery-operated device is surgically implanted. It emits mild electrical pulses to help improve stomach  emptying and to control nausea and vomiting.   The use of botulinum toxin has been shown to improve stomach emptying by decreasing the prolonged contractions of the muscle between the stomach and the small intestine (pyloric sphincter). The benefits are temporary.  SEEK MEDICAL CARE IF:   You are having problems keeping your blood glucose in goal range.   You are having nausea, vomiting, bloating, or early feelings of fullness with eating.   Your symptoms do not change with a change in diet.  Document Released: 12/30/2004 Document Revised: 09/11/2010 Document Reviewed: 06/08/2008 [facility] Patient Information 2012 [place], [place].Gastroesophageal Reflux Disease, Adult Gastroesophageal reflux disease (GERD) happens when acid from your stomach flows up into the esophagus. When acid comes in contact with the esophagus, the acid causes soreness (inflammation) in the esophagus. Over time, GERD may create small holes (ulcers) in the lining of the esophagus. CAUSES   Increased body weight. This puts pressure on the stomach, making acid rise from the stomach into the esophagus.   Smoking. This increases acid production in the stomach.   Drinking alcohol. This causes decreased pressure in the lower esophageal sphincter (valve or ring of muscle between the esophagus and stomach), allowing acid from the stomach into the esophagus.   Late evening meals and a full stomach. This increases pressure and acid production in the stomach.   A malformed lower esophageal sphincter.  Sometimes, no cause is found. SYMPTOMS   Burning pain in the lower part of the mid-chest behind the breastbone and in the mid-stomach area. This may occur twice a week or more often.   Trouble swallowing.   Sore throat.   Dry cough.   Asthma-like symptoms including chest tightness, shortness of breath, or wheezing.  DIAGNOSIS  Your caregiver may be able to diagnose GERD based on your symptoms. In some cases, X-rays and  other tests may be done to check for complications or to check the condition of your stomach and esophagus. TREATMENT  Your caregiver may recommend over-the-counter or prescription medicines to help decrease acid production. Ask your caregiver before starting or adding any new medicines.  HOME CARE INSTRUCTIONS   Change the factors that you can control. Ask your caregiver for guidance concerning weight loss, quitting smoking, and alcohol consumption.   Avoid foods and drinks that make your symptoms worse, such as:   Caffeine or alcoholic drinks.   Chocolate.   Peppermint or mint flavorings.   Garlic and onions.   Spicy foods.   Citrus fruits, such as oranges, lemons, or limes.   Tomato-based foods such as sauce, chili, salsa, and pizza.   Fried and fatty foods.   Avoid lying down for the 3 hours prior to your bedtime or prior to taking a nap.   Eat small, frequent meals instead of large meals.   Wear loose-fitting clothing. Do not wear anything tight around your waist that causes pressure on your stomach.   Raise the head of your bed 6 to 8 inches with wood blocks to help you sleep. Extra pillows will not help.   Only take over-the-counter or prescription medicines for pain, discomfort, or fever as directed by your caregiver.   Do not take aspirin, ibuprofen,  or other nonsteroidal anti-inflammatory drugs (NSAIDs).  SEEK IMMEDIATE MEDICAL CARE IF:   You have pain in your arms, neck, jaw, teeth, or back.   Your pain increases or changes in intensity or duration.   You develop nausea, vomiting, or sweating (diaphoresis).   You develop shortness of breath, or you faint.   Your vomit is green, yellow, black, or looks like coffee grounds or blood.   Your stool is red, bloody, or black.  These symptoms could be signs of other problems, such as heart disease, gastric bleeding, or esophageal bleeding. MAKE SURE YOU:   Understand these instructions.   Will watch your  condition.   Will get help right away if you are not doing well or get worse.  Document Released: 10/09/2004 Document Revised: 09/11/2010 Document Reviewed: 07/19/2010 [facility] Patient Information 2012 [place], [place].

## 2011-03-10 NOTE — Progress Notes (Signed)
Referring Provider: Cassell Smiles., MD Primary Care Physician:  Cassell Smiles., MD, MD Primary Gastroenterologist:  Dr. Jena Gauss  Chief Complaint  Patient presents with  . Gastrophageal Reflux    Refractory    HPI:  Tanya Velazquez is a 51 y.o. female here for refractory GERD and gastroparesis. She was last seen June 2012 and had an EGD and colonoscopy at that time. She was found to have acid reflux and a hiatal hernia. She has been waking up "strangling" & vomiting on a regular basis.  Her last meal is usually between 6-7pm.  She is having regurgitation of undigested food when she bends over.  She had a barium swallow [facility] that showed significant reflux.  There was no obstruction a barium pill.  She denies dysphagia or odynophagia.  C/o daily HB & indigestion that is always worse nocturnally.   She has been taking protonix 40mg  in the evening & omeprazole 20mg  every qam.  She has known gastroparesis diagnosed a couple years ago, but has not been on treatment for this. She does give recent diagnosis of diabetes mellitus last yr.  Her blood sugars were very poorly controlled, but now average 110-140.   She has failed multiple PPIs including nexium40mg  (no help) & prevacid 30mg (no help).  She is also tried over-the-counter toms when necessary without good relief. She occasionally takes over-the-counter Dulcolax for chronic constipation.  This seems to help. Her Wt is steadily increasing.  She is taking when necessary Advil and Naprosyn for her chronic pain. She also takes when necessary oxycodone and Robaxin.  Findings: 02/04/11 Normal esophageal distention. 12.5 mm diameter barium tablet passed from oral cavity to stomach without delay. No evidence of esophageal mass or stricture. Minimal impairment of esophageal motility with incomplete clearance of barium by primary peristaltic waves. Smooth appearance of esophageal mucosa without irregularity or ulceration. No persistent intraluminal  filling defects. Targeted rapid sequence imaging of the cervical esophagus and hypopharynx is normal. Incidentally noted gastroesophageal reflux into the distal third of the thoracic esophagus.   IMPRESSION: Gastroesophageal reflux into the distal third of the thoracic esophagus without evidence of esophagitis. Minimal age-related impairment of esophageal motility. Otherwise negative exam.  Past Medical History  Diagnosis Date  . Diabetes mellitus   . COPD (chronic obstructive pulmonary disease)   . Migraines   . Osteoarthritis   . Pernicious anemia   . Depression   . Chronic pain   . Gastroparesis 07/2007  . GERD (gastroesophageal reflux disease) 04/13/07    EGD Dr Rourk->patulous EG junction, HH, antral erosions  . Constipation   . S/P colonoscopy 04/13/07  . Dysphagia 2000    empiric dilation  . Anxiety     Past Surgical History  Procedure Date  . Complete hysterectomy   . Cholecystectomy   . Colonoscopy 3/31/9    Dr Rourk->friable anal canal  . Colonoscopy 0 06/27/10    Dr. Jena Gauss hyperplastic polyp, prominent external hemorrhoid plexus  . Esophagogastroduodenoscopy 06/27/10    Hiatal hernia, GERD,  . Shoulder arthroscopy     Current Outpatient Prescriptions  Medication Sig Dispense Refill  . ALPRAZolam (XANAX) 1 MG tablet Take 1 mg by mouth at bedtime as needed.        Marland Kitchen amitriptyline (ELAVIL) 50 MG tablet Take 50 mg by mouth at bedtime.       Marland Kitchen aspirin 81 MG tablet Take 81 mg by mouth daily.        . citalopram (CELEXA) 10 MG tablet Take by mouth  daily.       . enalapril (VASOTEC) 2.5 MG tablet Take 2.5 mg by mouth daily.       . Fluticasone-Salmeterol (ADVAIR DISKUS) 100-50 MCG/DOSE AEPB Inhale 1 puff into the lungs every 12 (twelve) hours.        Marland Kitchen glipiZIDE (GLUCOTROL) 5 MG tablet Take 5 mg by mouth 2 (two) times daily before a meal.       . ibuprofen (ADVIL,MOTRIN) 200 MG tablet Take 200 mg by mouth every 6 (six) hours as needed.        . methocarbamol  (ROBAXIN) 500 MG tablet Take 500 mg by mouth 4 (four) times daily.        . naproxen (NAPROSYN) 500 MG tablet Take 500 mg by mouth 2 (two) times daily with a meal.       . omeprazole (PRILOSEC) 40 MG capsule Take 40 mg by mouth daily.       Marland Kitchen oxycodone (ROXICODONE) 30 MG immediate release tablet Take 30 mg by mouth every 4 (four) hours as needed.       . pantoprazole (PROTONIX) 40 MG tablet Take 40 mg by mouth daily.      Marland Kitchen PROAIR HFA 108 (90 BASE) MCG/ACT inhaler Inhale 1 puff into the lungs every 4 (four) hours as needed.       Marland Kitchen dexlansoprazole (DEXILANT) 60 MG capsule 1 by mouth 30 minutes prior to dinner daily  31 capsule  1  . furosemide (LASIX) 20 MG tablet Take 40 mg by mouth Daily.      . metoCLOPramide (REGLAN) 5 MG tablet 1 by mouth 30 minutes prior to each meal and at bedtime (4 times a day)  120 tablet  1  . nitrofurantoin, macrocrystal-monohydrate, (MACROBID) 100 MG capsule 100 mg Daily.      Marland Kitchen zolpidem (AMBIEN) 5 MG tablet         Allergies as of 03/10/2011 - Review Complete 03/10/2011  Allergen Reaction Noted  . Amicinonide-benzyl alcohol (cyclocort)  06/20/2010  . Doxycycline  06/20/2010  . Paxil  04/08/2010  . Trimethoprim  06/20/2010  . Penicillins Rash 03/10/2011   Family history: Family History  Problem Relation Age of Onset  . Colon cancer Mother 68  . Alzheimer's disease Father   . Hypertension Father    Review of Systems: Gen: Denies any fever, chills, sweats, anorexia, fatigue, weakness, malaise, weight loss, and sleep disorder CV: Denies chest pain, angina, palpitations, syncope, orthopnea, PND, peripheral edema, and claudication. Resp: Denies dyspnea at rest, dyspnea with exercise, cough, sputum, wheezing, coughing up blood, and pleurisy. GI: Denies vomiting blood, jaundice, and fecal incontinence.   Denies dysphagia or odynophagia. Derm: Denies rash, itching, dry skin, hives, moles, warts, or unhealing ulcers.  Psych: Denies depression, anxiety, memory  loss, suicidal ideation, hallucinations, paranoia, and confusion. Heme: Denies bruising, bleeding, and enlarged lymph nodes.  Physical Exam: BP 102/66  Pulse 79  Temp(Src) 97.6 F (36.4 C) (Temporal)  Ht 5' 2.5" (1.588 m)  Wt 180 lb 3.2 oz (81.738 kg)  BMI 32.43 kg/m2 General:   Alert,  Well-developed, well-nourished, pleasant and cooperative in NAD Eyes:  Sclera clear, no icterus.   Conjunctiva pink. Mouth:  No deformity or lesions, oropharynx pink and moist. Neck:  Supple; no masses or thyromegaly. Heart:  Regular rate and rhythm; no murmurs, clicks, rubs,  or gallops. Abdomen:  Normal bowel sounds.  No bruits.  Soft, non-tender and non-distended without masses, hepatosplenomegaly or hernias noted.  No guarding or rebound tenderness.  Rectal:  Deferred. Msk:  Symmetrical without gross deformities.  Pulses:  Normal pulses noted. Extremities:  1+ lower extremity edema bilaterally. Neurologic:  Alert and oriented x4;  grossly normal neurologically. Skin:  Intact without significant lesions or rashes.

## 2011-03-11 ENCOUNTER — Encounter: Payer: Self-pay | Admitting: Urgent Care

## 2011-03-11 NOTE — Assessment & Plan Note (Signed)
Resolved

## 2011-03-11 NOTE — Assessment & Plan Note (Signed)
See GERD 

## 2011-03-11 NOTE — Assessment & Plan Note (Addendum)
Tanya Velazquez is a pleasant 51 y.o. female with history of chronic refractory GERD and gastroparesis. Recent barium pill esophagram shows significant reflux the distal third of her esophagus. She has been taking 2 different PPIs daily. There is no doubt her gastroparesis is contributing to her symptoms. She questions whether she'll be a candidate for antireflux surgery. Unfortunately she has not had good response with PPI and she has gastroparesis which is complicating the picture.  Stop the omeprazole and pantoprazole. Begin Dexilant 60 mg daily 30 minutes before dinner. Begin Reglan 5 mg 30 minutes before breakfast, lunch, dinner, and bedtime. Call if any side effects. 1-800-quit-now for help quitting smoking. Keep her blood sugars and if control can be sure you work with Dr. Cassell Smiles., MD on this. Gastroparesis diet, step 1 for 2 days, then advance as tolerated Gastroparesis literature Limit use of Robaxin and oxycodone Limit use of Advil and naproxen.

## 2011-03-11 NOTE — Progress Notes (Signed)
Faxed to PCP

## 2011-03-11 NOTE — Assessment & Plan Note (Signed)
Next colonoscopy June 2017

## 2011-03-31 ENCOUNTER — Ambulatory Visit (HOSPITAL_COMMUNITY)
Admission: RE | Admit: 2011-03-31 | Discharge: 2011-03-31 | Disposition: A | Discharge status: Home / Self Care | Payer: Medicare Other | Source: Ambulatory Visit | Attending: Internal Medicine | Admitting: Internal Medicine

## 2011-03-31 ENCOUNTER — Other Ambulatory Visit (HOSPITAL_COMMUNITY): Payer: Self-pay | Admitting: Internal Medicine

## 2011-03-31 DIAGNOSIS — R05 Cough: Secondary | ICD-10-CM | POA: Insufficient documentation

## 2011-03-31 DIAGNOSIS — R0602 Shortness of breath: Secondary | ICD-10-CM | POA: Insufficient documentation

## 2011-03-31 DIAGNOSIS — Z87891 Personal history of nicotine dependence: Secondary | ICD-10-CM | POA: Insufficient documentation

## 2011-03-31 DIAGNOSIS — R059 Cough, unspecified: Secondary | ICD-10-CM | POA: Insufficient documentation

## 2011-04-03 ENCOUNTER — Other Ambulatory Visit (HOSPITAL_COMMUNITY): Payer: Self-pay | Admitting: Internal Medicine

## 2011-04-03 DIAGNOSIS — R51 Headache: Secondary | ICD-10-CM

## 2011-04-07 ENCOUNTER — Ambulatory Visit (HOSPITAL_COMMUNITY)
Admission: RE | Admit: 2011-04-07 | Discharge: 2011-04-07 | Disposition: A | Discharge status: Home / Self Care | Payer: Medicare Other | Source: Ambulatory Visit | Attending: Internal Medicine | Admitting: Internal Medicine

## 2011-04-07 DIAGNOSIS — E119 Type 2 diabetes mellitus without complications: Secondary | ICD-10-CM | POA: Insufficient documentation

## 2011-04-07 DIAGNOSIS — R51 Headache: Secondary | ICD-10-CM | POA: Insufficient documentation

## 2011-04-29 ENCOUNTER — Ambulatory Visit: Payer: Medicare Other | Admitting: Urology

## 2011-05-02 ENCOUNTER — Ambulatory Visit (INDEPENDENT_AMBULATORY_CARE_PROVIDER_SITE_OTHER): Payer: Medicare Other | Admitting: Internal Medicine

## 2011-05-02 ENCOUNTER — Encounter: Payer: Self-pay | Admitting: Internal Medicine

## 2011-05-02 VITALS — BP 105/72 | HR 90 | Temp 98.4°F | Ht 62.5 in | Wt 179.2 lb

## 2011-05-02 DIAGNOSIS — K219 Gastro-esophageal reflux disease without esophagitis: Secondary | ICD-10-CM

## 2011-05-02 DIAGNOSIS — Z8719 Personal history of other diseases of the digestive system: Secondary | ICD-10-CM

## 2011-05-02 NOTE — Assessment & Plan Note (Signed)
51 year old lady with GERD in a setting of diabetes and gastroparesis. Reflux symptoms refractory to prokinetic and new acid suppression therapy.  We need to restage gastroparesis and make further attempts to get her reflux symptoms under good control with pharmacologic therapy prior to entertaining potential antireflux surgery option further.  Recommendations: Max is a shift of good glycemic control  Lose 10 pounds in the next 3 months  Stop Reglan; reestablish baseline gastric emptying with study next week.  For now, increase Dexilant 60 mg orally twice daily (before breakfast and supper). Telephone progress report regarding reflux in 3 weeks. Further recommendations to follow.  If this lady continues to have significant reflux on a twice a day PPI regimen, and her GES is not terribly abnormal, then we'll need to consider proceeding with an impedance/ pH study while on acid suppression therapy

## 2011-05-02 NOTE — Progress Notes (Signed)
Primary Care Physician:  Cassell Smiles., MD, MD Primary Gastroenterologist:  Dr. Jena Gauss  Pre-Procedure History & Physical: HPI:  Tanya Velazquez is a 51 y.o. female here for followup GERD gastroparesis. No improvement with change PPI to Dexilant 60 mg once daily and the addition of Reglan 5 mg a.c. and at bedtime. No side effects with Reglan. Has reflux symptoms almost every day taking TUMS on a daily basis to supplement pharmacologic regimen. Has reflux up into the distal one third of the tubular esophagus on recent barium examination. GS back in 2009 was slightly abnormal with 41 Percocet retention at 2 hours. Weight stable at 179 pounds  Past Medical History  Diagnosis Date  . Diabetes mellitus   . COPD (chronic obstructive pulmonary disease)   . Migraines   . Osteoarthritis   . Pernicious anemia   . Depression   . Chronic pain   . Gastroparesis 07/2007  . GERD (gastroesophageal reflux disease) 04/13/07    EGD Dr Alto Gandolfo->patulous EG junction, HH, antral erosions  . Constipation   . S/P colonoscopy 04/13/07  . Dysphagia 2000    empiric dilation  . Anxiety   . Hyperplastic colon polyp 06/27/2010    Past Surgical History  Procedure Date  . Complete hysterectomy   . Cholecystectomy   . Colonoscopy 3/31/9    Dr Contina Strain->friable anal canal  . Colonoscopy 0 06/27/10    Dr. Jena Gauss hyperplastic polyp, prominent external hemorrhoid plexus  . Esophagogastroduodenoscopy 06/27/10    Hiatal hernia, GERD,  . Shoulder arthroscopy     Prior to Admission medications   Medication Sig Start Date End Date Taking? Authorizing Provider  ALPRAZolam Prudy Feeler) 1 MG tablet Take 1 mg by mouth at bedtime as needed.     Yes Historical Provider, MD  amitriptyline (ELAVIL) 50 MG tablet Take 50 mg by mouth at bedtime.  06/11/10  Yes Historical Provider, MD  aspirin 81 MG tablet Take 81 mg by mouth daily.     Yes Historical Provider, MD  citalopram (CELEXA) 10 MG tablet Take by mouth daily.  05/13/10  Yes  Historical Provider, MD  dexlansoprazole (DEXILANT) 60 MG capsule 1 by mouth 30 minutes prior to dinner daily 03/10/11  Yes Joselyn Arrow, NP  enalapril (VASOTEC) 2.5 MG tablet Take 2.5 mg by mouth daily.  06/11/10  Yes Historical Provider, MD  Fluticasone-Salmeterol (ADVAIR DISKUS) 100-50 MCG/DOSE AEPB Inhale 1 puff into the lungs every 12 (twelve) hours.     Yes Historical Provider, MD  furosemide (LASIX) 20 MG tablet Take 40 mg by mouth Daily. 03/03/11  Yes Historical Provider, MD  glipiZIDE (GLUCOTROL) 5 MG tablet Take 5 mg by mouth 2 (two) times daily before a meal.  06/11/10  Yes Historical Provider, MD  ibuprofen (ADVIL,MOTRIN) 200 MG tablet Take 200 mg by mouth every 6 (six) hours as needed.     Yes Historical Provider, MD  methocarbamol (ROBAXIN) 500 MG tablet Take 500 mg by mouth 4 (four) times daily.     Yes Historical Provider, MD  metoCLOPramide (REGLAN) 5 MG tablet 1 by mouth 30 minutes prior to each meal and at bedtime (4 times a day) 03/10/11  Yes Joselyn Arrow, NP  naproxen (NAPROSYN) 500 MG tablet Take 500 mg by mouth 2 (two) times daily with a meal.  06/11/10  Yes Historical Provider, MD  nitrofurantoin, macrocrystal-monohydrate, (MACROBID) 100 MG capsule 100 mg Daily. 03/03/11  Yes Historical Provider, MD  oxycodone (ROXICODONE) 30 MG immediate release tablet Take 30 mg by  mouth every 4 (four) hours as needed.  06/11/10  Yes Historical Provider, MD  PROAIR HFA 108 (90 BASE) MCG/ACT inhaler Inhale 1 puff into the lungs every 4 (four) hours as needed.  05/13/10  Yes Historical Provider, MD  zolpidem (AMBIEN) 5 MG tablet  03/03/11  Yes Historical Provider, MD  omeprazole (PRILOSEC) 40 MG capsule Take 40 mg by mouth daily.  06/11/10   Historical Provider, MD  pantoprazole (PROTONIX) 40 MG tablet Take 40 mg by mouth daily.    Historical Provider, MD    Allergies as of 05/02/2011 - Review Complete 05/02/2011  Allergen Reaction Noted  . Amicinonide-benzyl alcohol (cyclocort)  06/20/2010  .  Doxycycline  06/20/2010  . Paxil  04/08/2010  . Trimethoprim  06/20/2010  . Penicillins Rash 03/10/2011    Family History  Problem Relation Age of Onset  . Colon cancer Mother 55  . Alzheimer's disease Father   . Hypertension Father     History   Social History  . Marital Status: Married    Spouse Name: N/A    Number of Children: 3  . Years of Education: N/A   Occupational History  . disabled    Social History Main Topics  . Smoking status: Current Everyday Smoker -- 1.0 packs/day for 33 years    Types: Cigarettes  . Smokeless tobacco: Not on file  . Alcohol Use: No  . Drug Use: No  . Sexually Active: Not on file   Other Topics Concern  . Not on file   Social History Narrative  . No narrative on file    Review of Systems: See HPI, otherwise negative ROS  Physical Exam: BP 105/72  Pulse 90  Temp(Src) 98.4 F (36.9 C) (Temporal)  Ht 5' 2.5" (1.588 m)  Wt 179 lb 3.2 oz (81.285 kg)  BMI 32.25 kg/m2 General:   Alert,  Well-developed, well-nourished, pleasant and cooperative in NAD Skin:  Intact without significant lesions or rashes. Eyes:  Sclera clear, no icterus.   Conjunctiva pink. Ears:  Normal auditory acuity. Nose:  No deformity, discharge,  or lesions. Mouth:  No deformity or lesions. Neck:  Supple; no masses or thyromegaly. No significant cervical adenopathy. Lungs:  Clear throughout to auscultation.   No wheezes, crackles, or rhonchi. No acute distress. Heart:  Regular rate and rhythm; no murmurs, clicks, rubs,  or gallops. Abdomen: Non-distended, normal bowel sounds.  No succussion splash. Soft and nontender without appreciable mass or hepatosplenomegaly.  Pulses:  Normal pulses noted. Extremities:  Without clubbing or edema.

## 2011-05-02 NOTE — Patient Instructions (Signed)
Stop reglan for now; get a repeat GES next week  Increase Dexilant to 60 mg Twice daily   Further recommendations to follow  Loose 10 pounds in next 3 months

## 2011-05-05 ENCOUNTER — Other Ambulatory Visit: Payer: Self-pay | Admitting: Gastroenterology

## 2011-05-05 ENCOUNTER — Telehealth: Payer: Self-pay | Admitting: Gastroenterology

## 2011-05-05 DIAGNOSIS — K219 Gastro-esophageal reflux disease without esophagitis: Secondary | ICD-10-CM

## 2011-05-05 NOTE — Telephone Encounter (Signed)
Pt scheduled for GES on 04/24 @ 8am - she is aware to be NPO after midnight

## 2011-05-07 ENCOUNTER — Encounter (HOSPITAL_COMMUNITY): Payer: Medicare Other

## 2011-05-12 ENCOUNTER — Encounter (HOSPITAL_COMMUNITY): Payer: Self-pay

## 2011-05-12 ENCOUNTER — Encounter (HOSPITAL_COMMUNITY)
Admission: RE | Admit: 2011-05-12 | Discharge: 2011-05-12 | Disposition: A | Discharge status: Home / Self Care | Payer: Medicare Other | Source: Ambulatory Visit | Attending: Internal Medicine | Admitting: Internal Medicine

## 2011-05-12 DIAGNOSIS — K219 Gastro-esophageal reflux disease without esophagitis: Secondary | ICD-10-CM | POA: Insufficient documentation

## 2011-05-12 MED ORDER — TECHNETIUM TC 99M SULFUR COLLOID
2.0000 | Freq: Once | INTRAVENOUS | Status: AC | PRN
  Administered 2011-05-12: 2 via ORAL

## 2011-05-15 ENCOUNTER — Other Ambulatory Visit: Payer: Self-pay | Admitting: Diagnostic Neuroimaging

## 2011-05-15 DIAGNOSIS — R51 Headache: Secondary | ICD-10-CM

## 2011-05-21 ENCOUNTER — Inpatient Hospital Stay: Admission: RE | Admit: 2011-05-21 | Payer: Medicare Other | Source: Ambulatory Visit

## 2011-05-21 ENCOUNTER — Telehealth: Payer: Self-pay | Admitting: Internal Medicine

## 2011-05-21 NOTE — Telephone Encounter (Signed)
Appt scheduled with [facility] 05/28/11

## 2011-05-21 NOTE — Telephone Encounter (Signed)
Patient is asking for swallowing function test please advise

## 2011-05-21 NOTE — Telephone Encounter (Signed)
Pt aware, her appt is on 05/27/11

## 2011-05-21 NOTE — Telephone Encounter (Signed)
No reported swallowing difficulty at last office visit. Gastric emptying study normal. This lady needs an office visit in the next 1-2 weeks with the extender to update her progress and see where we need to go next in terms of the evaluation.   No "swallowing test" at this time.

## 2011-05-27 ENCOUNTER — Telehealth: Payer: Self-pay | Admitting: Urgent Care

## 2011-05-27 ENCOUNTER — Ambulatory Visit: Payer: Medicare Other | Admitting: Urgent Care

## 2011-05-27 NOTE — Telephone Encounter (Signed)
Pt was a no show

## 2011-05-27 NOTE — Telephone Encounter (Signed)
Noted  

## 2011-06-04 ENCOUNTER — Other Ambulatory Visit: Payer: Self-pay

## 2011-06-04 MED ORDER — DEXLANSOPRAZOLE 60 MG PO CPDR: DELAYED_RELEASE_CAPSULE | ORAL | Status: DC

## 2011-06-11 ENCOUNTER — Ambulatory Visit (INDEPENDENT_AMBULATORY_CARE_PROVIDER_SITE_OTHER): Payer: Medicare Other | Admitting: Urgent Care

## 2011-06-11 ENCOUNTER — Encounter: Payer: Self-pay | Admitting: Urgent Care

## 2011-06-11 VITALS — BP 110/70 | HR 100 | Temp 97.5°F | Ht 62.5 in | Wt 177.2 lb

## 2011-06-11 DIAGNOSIS — K5900 Constipation, unspecified: Secondary | ICD-10-CM

## 2011-06-11 DIAGNOSIS — Z800 Family history of malignant neoplasm of digestive organs: Secondary | ICD-10-CM

## 2011-06-11 DIAGNOSIS — R1310 Dysphagia, unspecified: Secondary | ICD-10-CM

## 2011-06-11 DIAGNOSIS — Z8719 Personal history of other diseases of the digestive system: Secondary | ICD-10-CM

## 2011-06-11 MED ORDER — LINACLOTIDE 290 MCG PO CAPS: 1.0000 | ORAL_CAPSULE | Freq: Every day | ORAL | Status: DC

## 2011-06-11 NOTE — Patient Instructions (Addendum)
Begin Linzess 290 MCG daily for constipation Keep up the good work with your diet and exercise! Continue Dexilant 60 mg BID Stop Omeprazole Office visit in 6 months with Dr. Jena Gauss.  Sooner if needed

## 2011-06-11 NOTE — Assessment & Plan Note (Signed)
Begin Linzess 290 MCG daily for constipation Keep up the good work with your diet and exercise!  Office visit in 6 months with Dr. Jena Gauss.  Sooner if needed

## 2011-06-11 NOTE — Progress Notes (Signed)
Faxed to PCP

## 2011-06-11 NOTE — Progress Notes (Signed)
Referring Provider: Cassell Smiles., MD Primary Care Physician:  Cassell Smiles., MD, MD Primary Gastroenterologist:  Dr. Jena Gauss  Chief Complaint  Patient presents with  . Dysphagia  . Constipation   HPI:  Tanya Velazquez is a 51 y.o. female here for followup GERD.  She was having chronic nausea and had a recent gastric emptying study 05/05/11 off of Reglan which was normal. She is taking Dexilant 60 mg once daily and it seems to help.  C/o occasional nocturnal symptoms.  She has been eating earlier in the evenings.  She has been walking on treadmill. Working in her yard more. Occasional lower abdominal shooting pains, last couple mins. She is off reglan. Denies any further nausea. Her heartburn and indigestion are well controlled. Weight stable.  BM once per week.  Dysphagia resolved.    Past Medical History  Diagnosis Date  . Diabetes mellitus   . COPD (chronic obstructive pulmonary disease)   . Migraines   . Osteoarthritis   . Pernicious anemia   . Depression   . Chronic pain   . Gastroparesis 07/2007  . GERD (gastroesophageal reflux disease) 04/13/07    EGD Dr Rourk->patulous EG junction, HH, antral erosions  . Constipation   . S/P colonoscopy 04/13/07  . Dysphagia 2000    empiric dilation  . Anxiety   . Hyperplastic colon polyp 06/27/2010  . Asthma     Past Surgical History  Procedure Date  . Complete hysterectomy   . Cholecystectomy   . Colonoscopy 3/31/9    Dr Rourk->friable anal canal  . Colonoscopy 0 06/27/10    Dr. Jena Gauss hyperplastic polyp, prominent external hemorrhoid plexus  . Esophagogastroduodenoscopy 06/27/10    Hiatal hernia, GERD,  . Shoulder arthroscopy     Current Outpatient Prescriptions  Medication Sig Dispense Refill  . ALPRAZolam (XANAX) 1 MG tablet Take 1 mg by mouth at bedtime as needed.        Marland Kitchen amitriptyline (ELAVIL) 50 MG tablet Take 50 mg by mouth at bedtime.       Marland Kitchen aspirin 81 MG tablet Take 81 mg by mouth daily.        . citalopram  (CELEXA) 10 MG tablet Take by mouth daily.       Marland Kitchen dexlansoprazole (DEXILANT) 60 MG capsule 1 by mouth 30 minutes prior to dinner daily  31 capsule  11  . enalapril (VASOTEC) 2.5 MG tablet Take 2.5 mg by mouth daily.       . Fluticasone-Salmeterol (ADVAIR DISKUS) 100-50 MCG/DOSE AEPB Inhale 1 puff into the lungs every 12 (twelve) hours.        . furosemide (LASIX) 20 MG tablet Take 40 mg by mouth Daily.      Marland Kitchen glipiZIDE (GLUCOTROL) 5 MG tablet Take 5 mg by mouth 2 (two) times daily before a meal.       . ibuprofen (ADVIL,MOTRIN) 200 MG tablet Take 200 mg by mouth every 6 (six) hours as needed.        . methocarbamol (ROBAXIN) 500 MG tablet Take 500 mg by mouth 4 (four) times daily.        . metoCLOPramide (REGLAN) 5 MG tablet 1 by mouth 30 minutes prior to each meal and at bedtime (4 times a day)  120 tablet  1  . naproxen (NAPROSYN) 500 MG tablet Take 500 mg by mouth 2 (two) times daily with a meal.       . nitrofurantoin, macrocrystal-monohydrate, (MACROBID) 100 MG capsule 100 mg Daily.      Marland Kitchen  omeprazole (PRILOSEC) 40 MG capsule Take 40 mg by mouth daily.       Marland Kitchen oxycodone (ROXICODONE) 30 MG immediate release tablet Take 30 mg by mouth every 4 (four) hours as needed.       . pantoprazole (PROTONIX) 40 MG tablet Take 40 mg by mouth daily.      Marland Kitchen PROAIR HFA 108 (90 BASE) MCG/ACT inhaler Inhale 1 puff into the lungs every 4 (four) hours as needed.       . zolpidem (AMBIEN) 5 MG tablet       . Linaclotide (LINZESS) 290 MCG CAPS Take 1 capsule by mouth daily.  30 capsule  2    Allergies as of 06/11/2011 - Review Complete 06/11/2011  Allergen Reaction Noted  . Amicinonide-benzyl alcohol (cyclocort)  06/20/2010  . Doxycycline  06/20/2010  . Paroxetine hcl  04/08/2010  . Trimethoprim  06/20/2010  . Penicillins Rash 03/10/2011    Review of Systems: Gen: Denies any fever, chills, sweats, anorexia, fatigue, weakness, malaise, weight loss, and sleep disorder  CV: Denies chest pain, angina,  palpitations, syncope, orthopnea, PND, peripheral edema, and claudication. Resp: Denies dyspnea at rest, dyspnea with exercise, cough, sputum, wheezing, coughing up blood, and pleurisy. GI: Denies vomiting blood, jaundice, and fecal incontinence.    Derm: Denies rash, itching, dry skin, hives, moles, warts, or unhealing ulcers.  Psych: Denies depression, anxiety, memory loss, suicidal ideation, hallucinations, paranoia, and confusion. Heme: Denies bruising, bleeding, and enlarged lymph nodes.  Physical Exam: BP 110/70  Pulse 100  Temp(Src) 97.5 F (36.4 C) (Temporal)  Ht 5' 2.5" (1.588 m)  Wt 177 lb 3.2 oz (80.377 kg)  BMI 31.89 kg/m2 General:   Alert,  Well-developed, well-nourished, pleasant and cooperative in NAD Eyes:  Sclera clear, no icterus.   Conjunctiva pink. Mouth:  No deformity or lesions, oropharynx pink and moist. Neck:  Supple; no masses or thyromegaly. Heart:  Regular rate and rhythm; no murmurs, clicks, rubs,  or gallops. Abdomen:  Normal bowel sounds.  No bruits.  Soft, non-tender and non-distended without masses, hepatosplenomegaly or hernias noted.  No guarding or rebound tenderness.   Rectal:  Deferred.  Msk:  Symmetrical without gross deformities.  Pulses:  Normal pulses noted. Extremities:  No clubbing or edema. Neurologic:  Alert and oriented x4;  grossly normal neurologically. Skin:  Intact without significant lesions or rashes.

## 2011-06-11 NOTE — Assessment & Plan Note (Addendum)
GERD well-controlled on Dexilant 60 mg daily.  Dr. Luvenia Starch plan was to schedule pH/impedance test on twice a day PPI if she was still symptomatic.  She is having complete symptom control on Dexilant 60mg  BID AND Omeprazole 40mg  daily at this time. She has been doing well with low fat diet and exercise.  Wean off omeprazole, continue dexilant 60mg  BID Call w/ PR in 1 week.

## 2011-06-11 NOTE — Assessment & Plan Note (Signed)
Resolved

## 2011-06-13 ENCOUNTER — Telehealth: Payer: Self-pay | Admitting: Urgent Care

## 2011-06-13 NOTE — Telephone Encounter (Signed)
Spoke w/ pt.  Doing well. Instructed to take Dexilant 60mg  BID NO OMPERAZOLE Call PR 1 week Agrees w/ plan

## 2011-06-20 ENCOUNTER — Telehealth: Payer: Self-pay | Admitting: Internal Medicine

## 2011-06-20 ENCOUNTER — Ambulatory Visit (INDEPENDENT_AMBULATORY_CARE_PROVIDER_SITE_OTHER): Payer: Medicare Other | Admitting: Urology

## 2011-06-20 DIAGNOSIS — R3129 Other microscopic hematuria: Secondary | ICD-10-CM

## 2011-06-20 DIAGNOSIS — N3946 Mixed incontinence: Secondary | ICD-10-CM

## 2011-06-20 NOTE — Telephone Encounter (Signed)
Pt called earlier to give KJ a report on how her medicine is working for her. I told her that I would take a message, but she would rather speak with KJ. Please call pt at [phone]

## 2011-06-23 NOTE — Telephone Encounter (Signed)
I called Tanya Velazquez ans she want to let KJ that medication is working well for her.

## 2011-06-24 NOTE — Telephone Encounter (Signed)
Great!

## 2011-06-25 ENCOUNTER — Other Ambulatory Visit: Payer: Self-pay

## 2011-06-25 MED ORDER — DEXLANSOPRAZOLE 60 MG PO CPDR: 60.0000 mg | DELAYED_RELEASE_CAPSULE | Freq: Two times a day (BID) | ORAL | Status: DC

## 2011-06-27 ENCOUNTER — Telehealth: Payer: Self-pay | Admitting: Internal Medicine

## 2011-06-27 NOTE — Telephone Encounter (Signed)
The patient called the operator at the hospital for me this afternoon. I called her back. She gave me a progress report relating to her reflux. She states she's been taking Dexilant 60 mg orally twice daily for the past couple of weeks.  In spite of twice a day Dexilant, she tells me she has to take TUMS 2-3 times every day for breakthrough symptoms. Prior gastric emptying study normal. Prior esophagram demonstrates florid reflux.  We need to evaluate further for any ongoing acid/non-acid reflux on top of twice a day proton pump inhibitor therapy.  I told this lady we would schedule her for a pH/impedence study while on twice a day proton pump inhibitor therapy. She is not, however, to take any antacids like TUMS or Maalox or any H2 blockers during the test. She is to continue her Dexilant BID. Please schedule this study at [facility]. If this study shows persisting reflux, she probably needs to be referred for antireflux surgery. For now, she may use supplemental antacids to combat her reflux symptoms.

## 2011-06-30 NOTE — Telephone Encounter (Signed)
Tanya Velazquez, Please schedule pH/impedence study per Dr Luvenia Starch orders below. Thanks

## 2011-07-04 NOTE — Telephone Encounter (Signed)
Referral faxed to [facility] for [facility] +Impedance Study and Marilynne Drivers will contact the patient to schedule and patient is aware

## 2011-07-16 ENCOUNTER — Telehealth: Payer: Self-pay | Admitting: Internal Medicine

## 2011-07-16 NOTE — Telephone Encounter (Signed)
I called Pt her Dexilant is not working anymore. She does not know what to do. Please advise. She is going to [place] on Aug 8.

## 2011-07-16 NOTE — Telephone Encounter (Signed)
Pt called for Tanya Velazquez, but Doristine Church is gone for the rest of the day and won't be back until Friday. I offered to take message and patient just said to have Tanya Velazquez call her back regarding her acid reflux. I asked if one of our nurses could speak with her, but she said, No she wanted Tanya Velazquez to call her back on Friday. Her number is [phone]

## 2011-07-18 NOTE — Telephone Encounter (Signed)
LMOM for return call to speak w/ pt

## 2011-07-18 NOTE — Telephone Encounter (Signed)
Spoke w/ pt.  Continues to have acid reflux despite BID dexilant-med options limited Soledad Gerlach, please see if pt can be moved up sooner or work-ed in pt cancels at [employer]

## 2011-07-18 NOTE — Telephone Encounter (Signed)
Patients appointment at [facility] has been moved to Wednesday July 17th at 10:00 am and patient is aware

## 2011-07-18 NOTE — Telephone Encounter (Signed)
Ginger, if pt calls back, there are not a lot of options for treatment as she has tried all medications. Pt or Soledad Gerlach can call [facility] to see if she can be seen sooner. Thanks

## 2011-07-28 NOTE — Progress Notes (Signed)
REVIEWED.  

## 2011-08-01 ENCOUNTER — Other Ambulatory Visit (HOSPITAL_COMMUNITY): Payer: Self-pay | Admitting: Internal Medicine

## 2011-08-01 DIAGNOSIS — Z139 Encounter for screening, unspecified: Secondary | ICD-10-CM

## 2011-08-05 ENCOUNTER — Ambulatory Visit (HOSPITAL_COMMUNITY)
Admission: RE | Admit: 2011-08-05 | Discharge: 2011-08-05 | Disposition: A | Discharge status: Home / Self Care | Payer: Medicare Other | Source: Ambulatory Visit | Attending: Internal Medicine | Admitting: Internal Medicine

## 2011-08-05 DIAGNOSIS — Z1231 Encounter for screening mammogram for malignant neoplasm of breast: Secondary | ICD-10-CM | POA: Insufficient documentation

## 2011-08-05 DIAGNOSIS — Z139 Encounter for screening, unspecified: Secondary | ICD-10-CM

## 2011-08-12 ENCOUNTER — Telehealth: Payer: Self-pay | Admitting: *Deleted

## 2011-08-12 NOTE — Telephone Encounter (Signed)
Got results from [facility]- gave to [facility] for review.

## 2011-08-12 NOTE — Telephone Encounter (Signed)
Ms Tanya Velazquez called today. She had some test done recently and was wondering if we had received the results of them.  She would like a call back. Thanks.

## 2011-08-12 NOTE — Telephone Encounter (Signed)
Per KJ- pt has reflux but test was done off of a ppi. RMR wanted test done on ppi. Spoke with pt- she said they told her at [facility] to stop taking her ppi and she was not taking it when the test was done. Also informed pt that we would let RMR review results when he returns and we will let her know what the next step is. Results on RMR cart for review.

## 2011-08-20 ENCOUNTER — Telehealth: Payer: Self-pay

## 2011-08-20 NOTE — Telephone Encounter (Signed)
Pt called this morning wanting to know if RMR had the results of her test. I told that he had not looked over them yet, but we would let her know as soon as he did.

## 2011-08-31 ENCOUNTER — Encounter: Payer: Self-pay | Admitting: Internal Medicine

## 2011-08-31 NOTE — Progress Notes (Signed)
Patient ID: Tanya Velazquez, female   DOB: 05-24-1960, 51 y.o.   MRN: [id number] We know patient has GERD based on clinical assessment and prior barium esophagram. She's failed multiple PPIs.  Goal was to assess for any residual acidic and nonacidic reflux with pH's/independence while on acid suppression therapy.  She was taken off acid suppression therapy and the studies were performed under those conditions and, again, confirmed gastroesophageal reflux disease. Also some failed esophageal body contractions consistent with the poor bolus clearance. I would offer this nice lady a second opinion with Dr. Alycia Rossetti at [facility], who looked at her pH /impedance study to see whether or not he would recommend antireflux surgery as a therapeutic option.

## 2011-09-01 NOTE — Progress Notes (Signed)
Waiting on a return call from Dr. Alycia Rossetti or his nurse to schedule

## 2011-09-01 NOTE — Progress Notes (Signed)
Pt aware of results. Leigh-Ann can you please set up for her to go see Dr.Koch at [facility].

## 2011-09-01 NOTE — Telephone Encounter (Signed)
Pt know about test results

## 2011-09-03 NOTE — Progress Notes (Signed)
Referral has been sent to Dr. Alycia Rossetti and they will contact the patient with Date & Time and also notify our office with that information

## 2011-09-09 ENCOUNTER — Telehealth: Payer: Self-pay

## 2011-09-09 NOTE — Telephone Encounter (Signed)
Per Lorayne Marek at [facility] Dr. Nilsa Nutting office the patient is on a waiting list to see Dr. Alycia Rossetti and she will be give a 24 to 48 hr notice for appointment date and time

## 2011-09-09 NOTE — Telephone Encounter (Signed)
Pt was calling about the referral to Dr. Alycia Rossetti. She has not heard anything from them about an appointment. I asked Leigh-Ann to call them back and see what was going on and why they have not contacted the Pt.

## 2011-09-12 ENCOUNTER — Ambulatory Visit (INDEPENDENT_AMBULATORY_CARE_PROVIDER_SITE_OTHER): Payer: Medicare Other | Admitting: Urology

## 2011-09-12 DIAGNOSIS — N3946 Mixed incontinence: Secondary | ICD-10-CM

## 2011-09-12 DIAGNOSIS — R3129 Other microscopic hematuria: Secondary | ICD-10-CM

## 2011-09-17 ENCOUNTER — Other Ambulatory Visit: Payer: Self-pay | Admitting: Urology

## 2011-09-22 ENCOUNTER — Other Ambulatory Visit: Payer: Self-pay

## 2011-09-22 MED ORDER — LINACLOTIDE 290 MCG PO CAPS: 1.0000 | ORAL_CAPSULE | Freq: Every day | ORAL | Status: DC

## 2011-10-01 ENCOUNTER — Encounter (HOSPITAL_BASED_OUTPATIENT_CLINIC_OR_DEPARTMENT_OTHER): Payer: Self-pay | Admitting: *Deleted

## 2011-10-01 NOTE — Progress Notes (Addendum)
NPO AFTER MN. ARRIVES AT 0615. NEEDS ISTAT AND EKG. CURRENT CXR IN EPIC AND CHART. WILL TAKE DEXILANT AND DO ADVAIR INHALER AM OF SURG W/ SIP OF WATER. PT TO BRING NAME OF MIGRAINE PREVENTION DOS AND WILL TAKE THAT ALSO (PROBABLE TOPAMAX). ALSO WILL BRING PROAIR INHALER.

## 2011-10-06 NOTE — H&P (Signed)
ms 1. Abdominal Pain In Multiple Locations 789.09 2. Microscopic Hematuria 599.72 3. Postmenopausal Atrophic Vaginitis 627.3 4. Urge And Stress Incontinence 788.33  History of Present Illness  Mrs. Tanya Velazquez returns today in f/u.  She was seen in June with urgency and hematuria and cystoscopy was negative.  She has not had a prior sling.  She was given Vesicare 5mg  but didn't have any improvement in her urgency.  she still has incontinence with coughing and sneezing and nocturnal UUI.  She has some UUI during the day as well.  She wears some pads during the day.  She has some vaginal rawness and irritation.     She has an occasional symptom suggestive of prolapse.  She has had a hysterectomy.  [id number] NVD2.  She has had no other GU surgery.  She has had no stones and only rare remote UTI's.  She is interested in a sling.   Past Medical History 1. History of  Anxiety (Symptom) 300.00 2. History of  Arthritis V13.4 3. History of  Asthma 493.90 4. History of  Chronic Obstructive Pulmonary Disease 496 5. History of  Chronic Pain 338.29 6. History of  Chronic Reflux Esophagitis 530.11 7. History of  Depression 311 8. History of  Diabetes Mellitus 250.00 9. History of  Rib Fracture Left 807.00  Surgical History 1. History of  Appendectomy 2. History of  Cholecystectomy Laparoscopic 3. History of  Hemorrhoidectomy 4. History of  Total Abdominal Hysterectomy V45.77  Current Meds 1. Albuterol Sulfate (2.5 MG/3ML) 0.083% Inhalation Nebulization Solution; Therapy:  (Recorded:07Jun2013) to 2. ALPRAZolam 1 MG Oral Tablet; Therapy: (Recorded:07Jun2013) to 3. Amitriptyline HCl 100 MG Oral Tablet; Therapy: (Recorded:07Jun2013) to 4. Aspirin 81 MG Oral Tablet; Therapy: (Recorded:07Jun2013) to 5. Citalopram Hydrobromide 20 MG Oral Tablet; Therapy: (Recorded:07Jun2013) to 6. Dexilant 60 MG Oral Capsule Delayed Release; Therapy: (Recorded:07Jun2013) to 7. Enalapril Maleate 2.5 MG Oral Tablet; Therapy:  (Recorded:07Jun2013) to 8. Furosemide 20 MG Oral Tablet; Therapy: (Recorded:07Jun2013) to 9. GlipiZIDE 5 MG Oral Tablet; Therapy: (Recorded:07Jun2013) to 10. Ibuprofen 200 MG Oral Capsule; Therapy: (Recorded:07Jun2013) to 11. Naproxen 500 MG Oral Tablet; Therapy: (Recorded:07Jun2013) to 12. Omeprazole 40 MG Oral Capsule Delayed Release; Therapy: (Recorded:07Jun2013) to 13. OxyCODONE HCl TABS; Therapy: (Recorded:07Jun2013) to 14. Pulmicort 0.25 MG/2ML Inhalation Suspension; Therapy: (Recorded:07Jun2013) to 15. Ventolin HFA 108 (90 Base) MCG/ACT Inhalation Aerosol Solution; Therapy:   (Recorded:07Jun2013) to 16. VESIcare 5 MG Oral Tablet; Take 1 tablet daily; Therapy: 07Jun2013 to (Evaluate:02Jun2014)    Requested for: 07Jun2013; Last Rx:07Jun2013 17. Zolpidem Tartrate 5 MG Oral Tablet; Therapy: (Recorded:07Jun2013) to  Allergies 1. Doxycycline Hyclate CAPS 2. Penicillins 3. Ampicillin CAPS  Family History 1. Family history of  Colon Cancer V16.0 2. Family history of  Death In The Family Father 3. Family history of  Death In The Family Mother 4. Family history of  Dementia 5. Family history of  Family Health Status Number Of Children 6. Family history of  Heart Disease V17.49 7. Family history of  Parkinson's Disease  Social History 1. Currently On Disability 2. Marital History - Currently Married 3. Never Drank Alcohol 4. Smoking Cigarettes 305.1 1 pk p/d for 46 yrs  Review of Systems  Genitourinary: nocturia and incontinence, but no incomplete emptying of bladder.  Gastrointestinal: no diarrhea and no constipation.  Cardiovascular: no chest pain.  Respiratory: no shortness of breath.    Vitals Vital Signs [Data Includes: Last 1 Day]  30Aug2013 11:05AM  Blood Pressure: 111 / 72 Temperature: 98.2 F Heart Rate: 91  Physical  Exam Constitutional: Well nourished and well developed . No acute distress.  Pulmonary: No respiratory distress and normal respiratory rhythm and  effort.  Cardiovascular: Heart rate and rhythm are normal . No peripheral edema.    Results/Data Urine [Data Includes: Last 1 Day]   30Aug2013  COLOR YELLOW   APPEARANCE CLEAR   SPECIFIC GRAVITY 1.020   pH 5.5   GLUCOSE NEG mg/dL  BILIRUBIN SMALL   KETONE NEG mg/dL  BLOOD NEG   PROTEIN NEG mg/dL  UROBILINOGEN 1 mg/dL  NITRITE NEG   LEUKOCYTE ESTERASE TRACE   SQUAMOUS EPITHELIAL/HPF FEW   WBC 0-2 WBC/hpf  RBC NONE SEEN RBC/hpf  BACTERIA RARE   CRYSTALS NONE SEEN   CASTS NONE SEEN    PVR: Ultrasound PVR 13 ml.    Assessment 1. Urge And Stress Incontinence 788.33 2. Microscopic Hematuria 599.72 3. Postmenopausal Atrophic Vaginitis 627.3   She has no significant hematuria today. She has persistent mixed incontinence with no response to vesicare.   Plan Microscopic Hematuria (599.72)  1. UA With REFLEX  Done: 30Aug2013 11:07AM Urge And Stress Incontinence (788.33)  2. PVR U/S  Requested for: 30Aug2013      I am going to get her set up for a sling and reviewed the risks of bleeding, infection, injury to the urethra, bladder and adjacent structures, persistent SUI, denovo UUI, mesh erosion and extruction, chronic pain, dysparunia, outlet obstruction with possible need for prolonged catheterization, self cath or release of the sling, thrombotic events and anesthetic complications.   I am going to start her on estrace cream pending the procedure and provided samples, instructions and a review of the side effects.   Discussion/Summary   CC: Dr. Elfredia Nevins.

## 2011-10-07 ENCOUNTER — Encounter (HOSPITAL_BASED_OUTPATIENT_CLINIC_OR_DEPARTMENT_OTHER)
Admission: RE | Disposition: A | Discharge status: Home / Self Care | Payer: Self-pay | Source: Ambulatory Visit | Attending: Urology

## 2011-10-07 ENCOUNTER — Ambulatory Visit (HOSPITAL_BASED_OUTPATIENT_CLINIC_OR_DEPARTMENT_OTHER): Payer: Medicare Other | Admitting: Anesthesiology

## 2011-10-07 ENCOUNTER — Encounter (HOSPITAL_BASED_OUTPATIENT_CLINIC_OR_DEPARTMENT_OTHER): Payer: Self-pay | Admitting: Anesthesiology

## 2011-10-07 ENCOUNTER — Ambulatory Visit (HOSPITAL_BASED_OUTPATIENT_CLINIC_OR_DEPARTMENT_OTHER)
Admission: RE | Admit: 2011-10-07 | Discharge: 2011-10-07 | Disposition: A | Discharge status: Home / Self Care | Payer: Medicare Other | Source: Ambulatory Visit | Attending: Urology | Admitting: Urology

## 2011-10-07 ENCOUNTER — Encounter (HOSPITAL_BASED_OUTPATIENT_CLINIC_OR_DEPARTMENT_OTHER): Payer: Self-pay | Admitting: *Deleted

## 2011-10-07 DIAGNOSIS — Z79899 Other long term (current) drug therapy: Secondary | ICD-10-CM | POA: Insufficient documentation

## 2011-10-07 DIAGNOSIS — N952 Postmenopausal atrophic vaginitis: Secondary | ICD-10-CM | POA: Insufficient documentation

## 2011-10-07 DIAGNOSIS — E119 Type 2 diabetes mellitus without complications: Secondary | ICD-10-CM | POA: Insufficient documentation

## 2011-10-07 DIAGNOSIS — J4489 Other specified chronic obstructive pulmonary disease: Secondary | ICD-10-CM | POA: Insufficient documentation

## 2011-10-07 DIAGNOSIS — R109 Unspecified abdominal pain: Secondary | ICD-10-CM | POA: Insufficient documentation

## 2011-10-07 DIAGNOSIS — N3946 Mixed incontinence: Secondary | ICD-10-CM | POA: Insufficient documentation

## 2011-10-07 DIAGNOSIS — Z90710 Acquired absence of both cervix and uterus: Secondary | ICD-10-CM | POA: Insufficient documentation

## 2011-10-07 DIAGNOSIS — J449 Chronic obstructive pulmonary disease, unspecified: Secondary | ICD-10-CM | POA: Insufficient documentation

## 2011-10-07 DIAGNOSIS — R3129 Other microscopic hematuria: Secondary | ICD-10-CM | POA: Insufficient documentation

## 2011-10-07 HISTORY — DX: Other constipation: K59.09

## 2011-10-07 HISTORY — PX: BLADDER SUSPENSION: SHX72

## 2011-10-07 HISTORY — PX: CYSTOSCOPY: SHX5120

## 2011-10-07 HISTORY — DX: Other chronic pain: G89.29

## 2011-10-07 HISTORY — DX: Unspecified osteoarthritis, unspecified site: M19.90

## 2011-10-07 HISTORY — DX: Low back pain: M54.5

## 2011-10-07 HISTORY — DX: Dysphonia: R49.0

## 2011-10-07 HISTORY — DX: Essential (primary) hypertension: I10

## 2011-10-07 HISTORY — DX: Stress incontinence (female) (male): N39.3

## 2011-10-07 HISTORY — DX: Vitamin B12 deficiency anemia, unspecified: D51.9

## 2011-10-07 HISTORY — DX: Simple chronic bronchitis: J41.0

## 2011-10-07 HISTORY — DX: Low back pain, unspecified: M54.50

## 2011-10-07 HISTORY — DX: Personal history of other diseases of the digestive system: Z87.19

## 2011-10-07 LAB — POCT I-STAT 4, (NA,K, GLUC, HGB,HCT)
Glucose, Bld: 116 mg/dL — ABNORMAL HIGH (ref 70–99)
HCT: 39 % (ref 36.0–46.0)
Hemoglobin: 13.3 g/dL (ref 12.0–15.0)

## 2011-10-07 LAB — GLUCOSE, CAPILLARY: Glucose-Capillary: 129 mg/dL — ABNORMAL HIGH (ref 70–99)

## 2011-10-07 SURGERY — CREATION, URETHRAL SLING, RETROPUBIC APPROACH, USING POLYPROPYLENE TAPE
Anesthesia: General | Site: Urethra | Wound class: Clean Contaminated

## 2011-10-07 MED ORDER — SODIUM CHLORIDE 0.9 % IJ SOLN: 3.0000 mL | Freq: Two times a day (BID) | INTRAMUSCULAR | Status: DC

## 2011-10-07 MED ORDER — ONDANSETRON HCL 4 MG/2ML IJ SOLN
INTRAMUSCULAR | Status: DC | PRN
  Administered 2011-10-07: 4 mg via INTRAVENOUS

## 2011-10-07 MED ORDER — LIDOCAINE-EPINEPHRINE (PF) 1 %-1:200000 IJ SOLN: INTRAMUSCULAR | Status: DC | PRN

## 2011-10-07 MED ORDER — FENTANYL CITRATE 0.05 MG/ML IJ SOLN
25.0000 ug | INTRAMUSCULAR | Status: DC | PRN
  Administered 2011-10-07 (×2): 25 ug via INTRAVENOUS

## 2011-10-07 MED ORDER — LIDOCAINE HCL (CARDIAC) 20 MG/ML IV SOLN
INTRAVENOUS | Status: DC | PRN
  Administered 2011-10-07: 75 mg via INTRAVENOUS

## 2011-10-07 MED ORDER — FENTANYL CITRATE 0.05 MG/ML IJ SOLN
INTRAMUSCULAR | Status: DC | PRN
  Administered 2011-10-07: 50 ug via INTRAVENOUS
  Administered 2011-10-07 (×4): 25 ug via INTRAVENOUS

## 2011-10-07 MED ORDER — ONDANSETRON HCL 4 MG/2ML IJ SOLN: 4.0000 mg | Freq: Four times a day (QID) | INTRAMUSCULAR | Status: DC | PRN

## 2011-10-07 MED ORDER — OXYCODONE HCL 5 MG PO TABS: 5.0000 mg | ORAL_TABLET | ORAL | Status: DC | PRN

## 2011-10-07 MED ORDER — DOCUSATE SODIUM 100 MG PO CAPS: 100.0000 mg | ORAL_CAPSULE | Freq: Two times a day (BID) | ORAL | Status: DC

## 2011-10-07 MED ORDER — LACTATED RINGERS IV SOLN
INTRAVENOUS | Status: DC
  Administered 2011-10-07 (×3): via INTRAVENOUS

## 2011-10-07 MED ORDER — PROPOFOL 10 MG/ML IV BOLUS
INTRAVENOUS | Status: DC | PRN
  Administered 2011-10-07: 200 mg via INTRAVENOUS

## 2011-10-07 MED ORDER — KETOROLAC TROMETHAMINE 30 MG/ML IJ SOLN: 15.0000 mg | Freq: Once | INTRAMUSCULAR | Status: DC | PRN

## 2011-10-07 MED ORDER — ACETAMINOPHEN 10 MG/ML IV SOLN
INTRAVENOUS | Status: DC | PRN
  Administered 2011-10-07: 1000 mg via INTRAVENOUS

## 2011-10-07 MED ORDER — CIPROFLOXACIN HCL 500 MG PO TABS: 500.0000 mg | ORAL_TABLET | Freq: Two times a day (BID) | ORAL | Status: DC

## 2011-10-07 MED ORDER — SODIUM CHLORIDE 0.9 % IJ SOLN: 3.0000 mL | INTRAMUSCULAR | Status: DC | PRN

## 2011-10-07 MED ORDER — SODIUM CHLORIDE 0.9 % IV SOLN: 250.0000 mL | INTRAVENOUS | Status: DC | PRN

## 2011-10-07 MED ORDER — LIDOCAINE-EPINEPHRINE 1 %-1:100000 IJ SOLN
INTRAMUSCULAR | Status: DC | PRN
  Administered 2011-10-07: 8 mL

## 2011-10-07 MED ORDER — PROMETHAZINE HCL 25 MG/ML IJ SOLN: 6.2500 mg | INTRAMUSCULAR | Status: DC | PRN

## 2011-10-07 MED ORDER — CIPROFLOXACIN IN D5W 400 MG/200ML IV SOLN
400.0000 mg | INTRAVENOUS | Status: AC
  Administered 2011-10-07: 400 mg via INTRAVENOUS

## 2011-10-07 MED ORDER — ACETAMINOPHEN 650 MG RE SUPP: 650.0000 mg | RECTAL | Status: DC | PRN

## 2011-10-07 MED ORDER — HYDROMORPHONE HCL PF 1 MG/ML IJ SOLN: 0.2500 mg | INTRAMUSCULAR | Status: DC | PRN

## 2011-10-07 MED ORDER — SODIUM CHLORIDE 0.9 % IR SOLN
Status: DC | PRN
  Administered 2011-10-07: 08:00:00

## 2011-10-07 MED ORDER — ACETAMINOPHEN 325 MG PO TABS: 650.0000 mg | ORAL_TABLET | ORAL | Status: DC | PRN

## 2011-10-07 SURGICAL SUPPLY — 37 items
ADH SKN CLS APL DERMABOND .7 (GAUZE/BANDAGES/DRESSINGS) ×2
BAG URINE DRAINAGE (UROLOGICAL SUPPLIES) ×3 IMPLANT
BLADE SURG 15 STRL LF DISP TIS (BLADE) ×2 IMPLANT
BLADE SURG 15 STRL SS (BLADE) ×3
BLADE SURG ROTATE 9660 (MISCELLANEOUS) ×3 IMPLANT
CANISTER SUCTION 1200CC (MISCELLANEOUS) ×3 IMPLANT
CATH FOLEY 2WAY SLVR  5CC 16FR (CATHETERS) ×1
CATH FOLEY 2WAY SLVR 5CC 16FR (CATHETERS) ×2 IMPLANT
CLEANER CAUTERY TIP 5X5 PAD (MISCELLANEOUS) IMPLANT
CLOTH BEACON ORANGE TIMEOUT ST (SAFETY) ×3 IMPLANT
COVER MAYO STAND STRL (DRAPES) ×3 IMPLANT
DERMABOND ADVANCED (GAUZE/BANDAGES/DRESSINGS) ×1
DERMABOND ADVANCED .7 DNX12 (GAUZE/BANDAGES/DRESSINGS) ×2 IMPLANT
DRAPE LG THREE QUARTER DISP (DRAPES) ×3 IMPLANT
DRAPE UNDERBUTTOCKS STRL (DRAPE) ×3 IMPLANT
ELECT REM PT RETURN 9FT ADLT (ELECTROSURGICAL) ×3
ELECTRODE REM PT RTRN 9FT ADLT (ELECTROSURGICAL) IMPLANT
FLOSEAL 10ML (HEMOSTASIS) ×1 IMPLANT
GAUZE PACKING IODOFORM 2 (PACKING) ×1 IMPLANT
GAUZE SPONGE 4X4 16PLY XRAY LF (GAUZE/BANDAGES/DRESSINGS) ×1 IMPLANT
GLOVE INDICATOR 6.5 STRL GRN (GLOVE) ×1 IMPLANT
GLOVE SURG SS PI 8.0 STRL IVOR (GLOVE) ×3 IMPLANT
GOWN PREVENTION PLUS LG XLONG (DISPOSABLE) ×3 IMPLANT
GOWN STRL REIN XL XLG (GOWN DISPOSABLE) ×3 IMPLANT
HOLDER FOLEY CATH W/STRAP (MISCELLANEOUS) ×3 IMPLANT
NEEDLE HYPO 22GX1.5 SAFETY (NEEDLE) ×3 IMPLANT
PACK BASIN DAY SURGERY FS (CUSTOM PROCEDURE TRAY) ×3 IMPLANT
PACK CYSTOSCOPY (CUSTOM PROCEDURE TRAY) ×3 IMPLANT
PAD CLEANER CAUTERY TIP 5X5 (MISCELLANEOUS)
PENCIL BUTTON HOLSTER BLD 10FT (ELECTRODE) ×1 IMPLANT
PLUG CATH AND CAP STER (CATHETERS) ×3 IMPLANT
SUT VIC AB 2-0 UR6 27 (SUTURE) ×3 IMPLANT
SYR BULB IRRIGATION 50ML (SYRINGE) ×3 IMPLANT
SYRINGE 10CC LL (SYRINGE) ×3 IMPLANT
Sparc Sling ×1 IMPLANT
TUBE CONNECTING 12X1/4 (SUCTIONS) ×3 IMPLANT
YANKAUER SUCT BULB TIP NO VENT (SUCTIONS) ×3 IMPLANT

## 2011-10-07 NOTE — Anesthesia Postprocedure Evaluation (Signed)
  Anesthesia Post-op Note  Patient: Tanya Velazquez  Procedure(s) Performed: Procedure(s) (LRB): SPARC PROCEDURE (N/A) CYSTOSCOPY (N/A)  Patient Location: PACU  Anesthesia Type: General  Level of Consciousness: awake and alert   Airway and Oxygen Therapy: Patient Spontanous Breathing  Post-op Pain: mild  Post-op Assessment: Post-op Vital signs reviewed, Patient's Cardiovascular Status Stable, Respiratory Function Stable, Patent Airway and No signs of Nausea or vomiting  Post-op Vital Signs: stable  Complications: No apparent anesthesia complications

## 2011-10-07 NOTE — Anesthesia Preprocedure Evaluation (Addendum)
Anesthesia Evaluation  Patient identified by MRN, date of birth, ID band Patient awake    Reviewed: Allergy & Precautions, H&P , NPO status , Patient's Chart, lab work & pertinent test results  Airway Mallampati: III TM Distance: <3 FB Neck ROM: Limited  Mouth opening: Limited Mouth Opening  Dental  (+) Dental Advisory Given   Pulmonary COPDCurrent Smoker,  breath sounds clear to auscultation  + decreased breath sounds      Cardiovascular hypertension, Rhythm:Regular Rate:Normal     Neuro/Psych negative neurological ROS  negative psych ROS   GI/Hepatic Neg liver ROS,   Endo/Other  diabetes, Type 2, Oral Hypoglycemic Agents  Renal/GU negative Renal ROS  negative genitourinary   Musculoskeletal negative musculoskeletal ROS (+)   Abdominal   Peds negative pediatric ROS (+)  Hematology negative hematology ROS (+)   Anesthesia Other Findings   Reproductive/Obstetrics negative OB ROS                          Anesthesia Physical Anesthesia Plan  ASA: III  Anesthesia Plan: General   Post-op Pain Management:    Induction: Intravenous  Airway Management Planned: LMA and Oral ETT  Additional Equipment:   Intra-op Plan:   Post-operative Plan: Extubation in OR  Informed Consent: I have reviewed the patients History and Physical, chart, labs and discussed the procedure including the risks, benefits and alternatives for the proposed anesthesia with the patient or authorized representative who has indicated his/her understanding and acceptance.   Dental advisory given  Plan Discussed with: CRNA and Surgeon  Anesthesia Plan Comments: (Used glidescope for shoulder arthroscopy)       Anesthesia Quick Evaluation

## 2011-10-07 NOTE — Brief Op Note (Signed)
10/07/2011  8:20 AM  PATIENT:  Tanya Velazquez  51 y.o. female  PRE-OPERATIVE DIAGNOSIS:  Stress Urge Incontinence  POST-OPERATIVE DIAGNOSIS:  Stress Urge Incontinence  PROCEDURE:  Procedure(s) (LRB) with comments: SPARC PROCEDURE (N/A) - 1 hour requested for this case  SPARC SLING CYSTOSCOPY (N/A)  SURGEON:  Surgeon(s) and Role:    * Anner Crete, MD - Primary  PHYSICIAN ASSISTANT:   ASSISTANTS: none   ANESTHESIA:   general  EBL:  Total I/O In: 100 [I.V.:100] Out: 100 [Blood:100]  BLOOD ADMINISTERED:none  DRAINS: Urinary Catheter (Foley)   LOCAL MEDICATIONS USED:  LIDOCAINE   SPECIMEN:  No Specimen  DISPOSITION OF SPECIMEN:  N/A  COUNTS:  YES  TOURNIQUET:  * No tourniquets in log *  DICTATION: .Other Dictation: Dictation Number [id number]  PLAN OF CARE: Discharge to home after PACU  PATIENT DISPOSITION:  PACU - hemodynamically stable.   Delay start of Pharmacological VTE agent (>24hrs) due to surgical blood loss or risk of bleeding: yes

## 2011-10-07 NOTE — Anesthesia Procedure Notes (Signed)
Procedure Name: LMA Insertion Date/Time: 10/07/2011 7:33 AM Performed by: Fran Lowes Pre-anesthesia Checklist: Patient identified, Emergency Drugs available, Suction available and Patient being monitored Patient Re-evaluated:Patient Re-evaluated prior to inductionOxygen Delivery Method: Circle System Utilized Preoxygenation: Pre-oxygenation with 100% oxygen Intubation Type: IV induction Ventilation: Mask ventilation without difficulty LMA: LMA inserted LMA Size: 4.0 Number of attempts: 1 Airway Equipment and Method: bite block Placement Confirmation: positive ETCO2 Tube secured with: Tape Dental Injury: Teeth and Oropharynx as per pre-operative assessment

## 2011-10-07 NOTE — Interval H&P Note (Signed)
History and Physical Interval Note:  10/07/2011 7:20 AM  Tanya Velazquez  has presented today for surgery, with the diagnosis of Stress Urge Incontinence  The various methods of treatment have been discussed with the patient and family. After consideration of risks, benefits and other options for treatment, the patient has consented to  Procedure(s) (LRB) with comments: SPARC PROCEDURE (N/A) - 1 hour requested for this case  [facility] SLING as a surgical intervention .  The patient's history has been reviewed, patient examined, no change in status, stable for surgery.  I have reviewed the patient's chart and labs.  Questions were answered to the patient's satisfaction.     Sherrilyn Nairn J

## 2011-10-07 NOTE — Transfer of Care (Signed)
Immediate Anesthesia Transfer of Care Note  Patient: Tanya Velazquez  Procedure(s) Performed: Procedure(s) (LRB): SPARC PROCEDURE (N/A) CYSTOSCOPY (N/A)  Patient Location: Patient transported to PACU with oxygen via face mask at 4 Liters / Min  Anesthesia Type: General  Level of Consciousness: awake and alert   Airway & Oxygen Therapy: Patient Spontanous Breathing and Patient connected to face mask oxygen  Post-op Assessment: Report given to PACU RN and Post -op Vital signs reviewed and stable  Post vital signs: Reviewed and stable  Dentition: Teeth and oropharynx remain in pre-op condition  Complications: No apparent anesthesia complications

## 2011-10-08 ENCOUNTER — Encounter (HOSPITAL_BASED_OUTPATIENT_CLINIC_OR_DEPARTMENT_OTHER): Payer: Self-pay | Admitting: Urology

## 2011-10-08 NOTE — Op Note (Signed)
NAMEWALLACE, COGLIANO NO.:  [id number]  MEDICAL RECORD NO.:  [id number]  LOCATION:                               FACILITY:  [facility]  PHYSICIAN:  Excell Seltzer. Annabell Howells, M.D.    DATE OF BIRTH:  1960/10/19  DATE OF PROCEDURE:  10/07/2011 DATE OF DISCHARGE:  10/07/2011                              OPERATIVE REPORT   PROCEDURE:  SPARC sling.  PREOPERATIVE DIAGNOSIS:  Stress urinary incontinence.  POSTOPERATIVE DIAGNOSIS:  Stress urinary incontinence.  SURGEON:  Excell Seltzer. Annabell Howells, M.D.  ANESTHESIA:  General.  SPECIMEN:  None.  DRAINS:  Foley catheter.  BLOOD LOSS:  100 mL.  COMPLICATIONS:  None.  INDICATIONS:  [name] is a 51 year old female with stress urinary incontinence.  She has elected to undergo a sling.  She was fully consented including an explanation of potential mesh complication.  FINDINGS OF PROCEDURE:  She was given Cipro.  She was taken to the operating room where general anesthetic was induced.  She was fitted with PAS hose and placed in lithotomy position.  Her mons was clipped. She was prepped with Betadine solution and draped in usual sterile fashion.  Time-out was performed.  A 16-French Foley catheter was inserted, the bladder was drained, and the catheter was plugged.  The suprapubic incision sites were marked 2 cm lateral to the midline on each side just above the pubis.  The anterior vaginal wall was infiltrated with 8 mL of 1% lidocaine with epinephrine.  An anterior vaginal wall incision was made with the knife over the midurethra, approximately 2-3 cm in length.  The pubourethral fascia was pierced allowing placement of a finger adjacent to the urethra on each side.  There was some venous bleeding on the right. This was managed with gauze and compression.  The suprapubic incisions were then made approximately 1 cm in length each and the subcutaneous tissues were spread to the pubis.  The right SPARC trocar was then passed.  The tip of  the trocar was taken down to the top of the pubis, walked along the back of the pubic bone and then, the tip was palpated with finger in the right vaginal incision with care being taken to reflect the urethra to the contralateral side.  The trocar was then passed into the vaginal incision.  This was then repeated on the left side.  At this point, cystoscopy was performed and inspection with the 70-degree lens revealed no bladder wall injury from the trocar and efflux of urine from both ureteral orifices.  The [facility] mesh was then connected to each of the trocars and the mesh was then drawn into position in the standard fashion.  At this point, there was some brisk bleeding from the right to the left side that was managed with pressure.  Cystoscopy at this point revealed once again no bladder wall injuries, no urethral injuries and efflux from the ureteral orifices.  At this point, a 16-French Foley catheter was reinserted.  A right angle clamp was used to maintain spacing between the urethra and the mesh. The sheath was then removed after the sheath had been irrigated with antibiotic solution.  The mesh had been soaked in  prior to implant.  Once the sheaths had been removed, there was some brisk bleeding from the left paravaginal veins.  Gauze sponges were used to provide compression for 3 minutes, but there was still moderate bleeding after this period of compression.  So, FloSeal was then prepared and approximately 8 mL was applied to the area of bleeding in the left side of the vaginal incision.  Pressure was then placed and this was left for 3 minutes.  Once 3 minutes had passed, the gauze was removed and the bleeding was greatly minimized.  The residual FloSeal was then irrigated from the wound using the antibiotic solution.  At this point, the anterior vaginal wall was closed using a running locked 2-0 Vicryl suture.  The abdominal ends of the mesh were then trimmed and allowed  to drop back into the subcutaneous space.  The wound was cleansed and the incisions were closed with Dermabond.  A 2-inch iodoform vaginal packing was then placed.  The Foley catheter was placed to straight drainage.  The patient was taken down from lithotomy position.  Her anesthetic was reversed.  She was moved to recovery room in stable condition.  There were no complications, but there was moderate bleeding, requiring use of hemostatic agent.     Excell Seltzer. Annabell Howells, M.D.     JJW/MEDQ  D:  10/07/2011  T:  10/07/2011  Job:  [id number]

## 2011-10-17 ENCOUNTER — Ambulatory Visit: Admission: RE | Admit: 2011-10-17 | Payer: Medicare Other | Source: Ambulatory Visit

## 2011-10-17 ENCOUNTER — Ambulatory Visit (INDEPENDENT_AMBULATORY_CARE_PROVIDER_SITE_OTHER): Payer: Medicare Other | Admitting: Urology

## 2011-10-17 DIAGNOSIS — N3946 Mixed incontinence: Secondary | ICD-10-CM

## 2011-11-19 ENCOUNTER — Telehealth: Payer: Self-pay | Admitting: Internal Medicine

## 2011-11-19 NOTE — Telephone Encounter (Signed)
Patient is scheduled with Dr. Alycia Rossetti at [facility] on February 19th at 1:00 and is on a waiting list if someone cancels she will be notified and she is aware of this

## 2011-11-19 NOTE — Telephone Encounter (Signed)
Spoke with pt- she is still having the hurting and burning in her chest from the acid reflux that she had before. She is taking dexilant bid and its not helping anymore. Pt still has not heard from [facility] about her appointment. She wants to know what else can be done. Please advise.  Benedetto Goad, please call [facility] and find out why they havent gotten the pt an appointment yet. Thanks.

## 2011-11-19 NOTE — Telephone Encounter (Signed)
Pt LMOM that she needed to speak with KJ. She is still having the same problems as before. Please advise and call her back at [phone] I forwarded VM to JL

## 2011-11-20 NOTE — Telephone Encounter (Signed)
Pt said she thought she has that medication at home but only takes it once a day. I told her if that was what she had at home to increase it to 4 times a day and if not to call us back and we will call it in for her.

## 2011-11-20 NOTE — Telephone Encounter (Signed)
We can always try Carafate suspension 1 g 4 times a day if not previously tried and there is no history of allergies or intolerances to this medication (could dispense 120 doses with 3 refills)

## 2011-11-20 NOTE — Telephone Encounter (Signed)
Will FWD to Dr Jena Gauss to see if he has other ideas in the interim?

## 2011-11-24 ENCOUNTER — Encounter: Payer: Self-pay | Admitting: *Deleted

## 2011-12-05 ENCOUNTER — Telehealth: Payer: Self-pay | Admitting: Urgent Care

## 2011-12-05 ENCOUNTER — Ambulatory Visit: Payer: Medicare Other | Admitting: Urgent Care

## 2011-12-05 NOTE — Telephone Encounter (Signed)
Pt was a no show

## 2011-12-05 NOTE — Telephone Encounter (Signed)
noted 

## 2011-12-10 ENCOUNTER — Ambulatory Visit: Payer: Medicare Other | Admitting: Urgent Care

## 2011-12-10 ENCOUNTER — Telehealth: Payer: Self-pay | Admitting: Urgent Care

## 2011-12-10 NOTE — Telephone Encounter (Signed)
Pt was a no show

## 2011-12-10 NOTE — Telephone Encounter (Signed)
noted 

## 2012-01-23 ENCOUNTER — Ambulatory Visit (INDEPENDENT_AMBULATORY_CARE_PROVIDER_SITE_OTHER): Payer: Medicare Other | Admitting: Urology

## 2012-01-23 DIAGNOSIS — R300 Dysuria: Secondary | ICD-10-CM

## 2012-01-23 DIAGNOSIS — N3946 Mixed incontinence: Secondary | ICD-10-CM

## 2012-02-10 ENCOUNTER — Ambulatory Visit: Payer: Medicare Other | Admitting: Urgent Care

## 2012-03-30 ENCOUNTER — Ambulatory Visit: Payer: Medicare Other | Admitting: Urgent Care

## 2012-04-08 ENCOUNTER — Other Ambulatory Visit: Payer: Self-pay

## 2012-04-09 MED ORDER — DEXLANSOPRAZOLE 60 MG PO CPDR: 60.0000 mg | DELAYED_RELEASE_CAPSULE | Freq: Two times a day (BID) | ORAL | Status: DC

## 2012-04-09 NOTE — Telephone Encounter (Signed)
RX would not go electronically. Please call in Dexilant 60mg  BID before meals. #60 with 3 refills.

## 2012-04-09 NOTE — Telephone Encounter (Signed)
Has she seen Dr. Alycia Rossetti at [facility], if so get records.

## 2012-04-12 NOTE — Telephone Encounter (Signed)
I called in Rx. 

## 2012-07-27 ENCOUNTER — Ambulatory Visit: Payer: Medicare Other | Admitting: Internal Medicine

## 2012-08-05 ENCOUNTER — Ambulatory Visit: Payer: Medicare Other | Admitting: Internal Medicine

## 2012-08-19 ENCOUNTER — Other Ambulatory Visit (HOSPITAL_COMMUNITY): Payer: Self-pay | Admitting: Internal Medicine

## 2012-08-19 ENCOUNTER — Other Ambulatory Visit: Payer: Self-pay

## 2012-08-19 ENCOUNTER — Ambulatory Visit (HOSPITAL_COMMUNITY)
Admission: RE | Admit: 2012-08-19 | Discharge: 2012-08-19 | Disposition: A | Discharge status: Home / Self Care | Payer: Medicare Other | Source: Ambulatory Visit | Attending: Internal Medicine | Admitting: Internal Medicine

## 2012-08-19 ENCOUNTER — Ambulatory Visit: Payer: Medicare Other | Admitting: Internal Medicine

## 2012-08-19 DIAGNOSIS — R05 Cough: Secondary | ICD-10-CM

## 2012-08-19 DIAGNOSIS — Z87891 Personal history of nicotine dependence: Secondary | ICD-10-CM | POA: Insufficient documentation

## 2012-08-19 DIAGNOSIS — R059 Cough, unspecified: Secondary | ICD-10-CM | POA: Insufficient documentation

## 2012-08-19 DIAGNOSIS — R079 Chest pain, unspecified: Secondary | ICD-10-CM | POA: Insufficient documentation

## 2012-08-19 MED ORDER — DEXLANSOPRAZOLE 60 MG PO CPDR: 60.0000 mg | DELAYED_RELEASE_CAPSULE | Freq: Two times a day (BID) | ORAL | Status: DC

## 2012-08-27 ENCOUNTER — Ambulatory Visit: Payer: Medicare Other | Admitting: Internal Medicine

## 2012-08-31 ENCOUNTER — Emergency Department (HOSPITAL_COMMUNITY)
Admission: EM | Admit: 2012-08-31 | Discharge: 2012-08-31 | Disposition: A | Discharge status: Home / Self Care | Payer: Medicare Other | Attending: Emergency Medicine | Admitting: Emergency Medicine

## 2012-08-31 ENCOUNTER — Encounter (HOSPITAL_COMMUNITY): Payer: Self-pay | Admitting: *Deleted

## 2012-08-31 ENCOUNTER — Emergency Department (HOSPITAL_COMMUNITY): Payer: Medicare Other

## 2012-08-31 DIAGNOSIS — M1990 Unspecified osteoarthritis, unspecified site: Secondary | ICD-10-CM | POA: Insufficient documentation

## 2012-08-31 DIAGNOSIS — J4489 Other specified chronic obstructive pulmonary disease: Secondary | ICD-10-CM | POA: Insufficient documentation

## 2012-08-31 DIAGNOSIS — G43909 Migraine, unspecified, not intractable, without status migrainosus: Secondary | ICD-10-CM | POA: Insufficient documentation

## 2012-08-31 DIAGNOSIS — K219 Gastro-esophageal reflux disease without esophagitis: Secondary | ICD-10-CM | POA: Insufficient documentation

## 2012-08-31 DIAGNOSIS — Z8719 Personal history of other diseases of the digestive system: Secondary | ICD-10-CM | POA: Insufficient documentation

## 2012-08-31 DIAGNOSIS — R059 Cough, unspecified: Secondary | ICD-10-CM | POA: Insufficient documentation

## 2012-08-31 DIAGNOSIS — G8929 Other chronic pain: Secondary | ICD-10-CM | POA: Insufficient documentation

## 2012-08-31 DIAGNOSIS — R05 Cough: Secondary | ICD-10-CM | POA: Insufficient documentation

## 2012-08-31 DIAGNOSIS — F17200 Nicotine dependence, unspecified, uncomplicated: Secondary | ICD-10-CM | POA: Insufficient documentation

## 2012-08-31 DIAGNOSIS — I10 Essential (primary) hypertension: Secondary | ICD-10-CM | POA: Insufficient documentation

## 2012-08-31 DIAGNOSIS — Z792 Long term (current) use of antibiotics: Secondary | ICD-10-CM | POA: Insufficient documentation

## 2012-08-31 DIAGNOSIS — E119 Type 2 diabetes mellitus without complications: Secondary | ICD-10-CM | POA: Insufficient documentation

## 2012-08-31 DIAGNOSIS — Z880 Allergy status to penicillin: Secondary | ICD-10-CM | POA: Insufficient documentation

## 2012-08-31 DIAGNOSIS — F411 Generalized anxiety disorder: Secondary | ICD-10-CM | POA: Insufficient documentation

## 2012-08-31 DIAGNOSIS — R6883 Chills (without fever): Secondary | ICD-10-CM | POA: Insufficient documentation

## 2012-08-31 DIAGNOSIS — Z7982 Long term (current) use of aspirin: Secondary | ICD-10-CM | POA: Insufficient documentation

## 2012-08-31 DIAGNOSIS — Z8742 Personal history of other diseases of the female genital tract: Secondary | ICD-10-CM | POA: Insufficient documentation

## 2012-08-31 DIAGNOSIS — R0602 Shortness of breath: Secondary | ICD-10-CM | POA: Insufficient documentation

## 2012-08-31 DIAGNOSIS — Z79899 Other long term (current) drug therapy: Secondary | ICD-10-CM | POA: Insufficient documentation

## 2012-08-31 DIAGNOSIS — Z860100 Personal history of colon polyps, unspecified: Secondary | ICD-10-CM | POA: Insufficient documentation

## 2012-08-31 DIAGNOSIS — Z791 Long term (current) use of non-steroidal anti-inflammatories (NSAID): Secondary | ICD-10-CM | POA: Insufficient documentation

## 2012-08-31 DIAGNOSIS — J40 Bronchitis, not specified as acute or chronic: Secondary | ICD-10-CM

## 2012-08-31 DIAGNOSIS — R079 Chest pain, unspecified: Secondary | ICD-10-CM | POA: Insufficient documentation

## 2012-08-31 DIAGNOSIS — R5381 Other malaise: Secondary | ICD-10-CM | POA: Insufficient documentation

## 2012-08-31 DIAGNOSIS — J449 Chronic obstructive pulmonary disease, unspecified: Secondary | ICD-10-CM | POA: Insufficient documentation

## 2012-08-31 DIAGNOSIS — Z862 Personal history of diseases of the blood and blood-forming organs and certain disorders involving the immune mechanism: Secondary | ICD-10-CM | POA: Insufficient documentation

## 2012-08-31 LAB — CBC WITH DIFFERENTIAL/PLATELET
Basophils Absolute: 0 10*3/uL (ref 0.0–0.1)
Eosinophils Absolute: 0.1 10*3/uL (ref 0.0–0.7)
HCT: 38.1 % (ref 36.0–46.0)
Lymphs Abs: 2.6 10*3/uL (ref 0.7–4.0)
MCHC: 33.9 g/dL (ref 30.0–36.0)
MCV: 93.8 fL (ref 78.0–100.0)
Monocytes Absolute: 0.6 10*3/uL (ref 0.1–1.0)
Neutro Abs: 4.7 10*3/uL (ref 1.7–7.7)
RDW: 14.5 % (ref 11.5–15.5)

## 2012-08-31 LAB — BASIC METABOLIC PANEL
BUN: 7 mg/dL (ref 6–23)
Creatinine, Ser: 0.89 mg/dL (ref 0.50–1.10)
GFR calc Af Amer: 85 mL/min — ABNORMAL LOW (ref >90)
GFR calc non Af Amer: 73 mL/min — ABNORMAL LOW (ref >90)

## 2012-08-31 LAB — GLUCOSE, CAPILLARY: Glucose-Capillary: 93 mg/dL (ref 70–99)

## 2012-08-31 MED ORDER — ALBUTEROL SULFATE (5 MG/ML) 0.5% IN NEBU
5.0000 mg | INHALATION_SOLUTION | Freq: Once | RESPIRATORY_TRACT | Status: AC
  Administered 2012-08-31: 5 mg via RESPIRATORY_TRACT
  Filled 2012-08-31: qty 1

## 2012-08-31 NOTE — ED Provider Notes (Signed)
CSN: [id number]     Arrival date & time 08/31/12  1418 History  This chart was scribed for [employer]. Tanya Payor, MD,  by Ashley Jacobs, ED Scribe. The patient was seen in room APA04/APA04 and the patient's care was started at 2:45 PM    Chief Complaint  Patient presents with  . Chest Pain   (Consider location/radiation/quality/duration/timing/severity/associated sxs/prior Treatment) Patient is a 52 y.o. female presenting with chest pain. The history is provided by the patient and medical records. No language interpreter was used.  Chest Pain Pain location:  L chest Pain quality: aching and dull   Pain radiates to:  Does not radiate Pain radiates to the back: no   Pain severity:  Severe Onset quality:  Sudden Timing:  Constant Progression:  Worsening Chronicity:  New Relieved by:  Nothing Worsened by:  Nothing tried Ineffective treatments:  None tried Associated symptoms: cough, fatigue and shortness of breath   Associated symptoms: no fever   Risk factors: hypertension and smoking    HPI Comments: Tanya Velazquez is a 52 y.o. female who presents to the Emergency Department complaining of dull,constant left sided chest pain that presented this morning. She mentions that the coughs are constant for one week. She has a hx of diabetes, osteoarthritis, GERD, anxiety, COPD, smoker's cough and hypertension. Pt denies nausea, vomiting and diarrhea.Nothing seems to relieve or worsen symptoms.   Pt currently smokes Past Medical History  Diagnosis Date  . Diabetes mellitus   . Migraines   . Osteoarthritis   . GERD (gastroesophageal reflux disease) 04/13/07    EGD Dr Rourk->patulous EG junction, HH, antral erosions  . Anxiety   . Hyperplastic colon polyp 06/27/2010  . H/O hiatal hernia   . SUI (stress urinary incontinence, female)   . COPD (chronic obstructive pulmonary disease)   . DJD (degenerative joint disease)   . Chronic low back pain   . B12 deficiency anemia   . Chronic  constipation   . Hypertension   . Smokers' cough   . Raspy voice    Past Surgical History  Procedure Laterality Date  . Colonoscopy  3/31/9    Dr Rourk->friable anal canal  . Colonoscopy  0 06/27/10    Dr. Jena Gauss hyperplastic polyp, prominent external hemorrhoid plexus  . Esophagogastroduodenoscopy  06/27/10    Hiatal hernia, GERD,  . Shoulder arthroscopy  08-13-2010    LEFT SHOULDER IMPINGEMENT SYNDROME/ DJD OF AC JOINT  . Right shoulder arthroscopy/ distal clavicle resection / debridement rotator cuff and labrum tear/ bursectomy  06-28-2010    IMPINGEMENT SYNDROME/ AC JOINT ARTHRITIS/ ROTATOR CUFF TENDINOPATHY  . Laparoscopic cholecystectomy  02-23-2007  . Head-up tilt table test  01-21-2007 &  11-04-2004  DR WEINTRAUB    HX CHRONIC RECURRENT SYNCOPAL. EPICODES--  NEGATIVE RESULT INCLUDING ISUPREL INFUSION  . Excision volar ganglion left wrist  06-27-2005  . Cardiac catheterization  08-09-2004 DR Jacinto Halim    NORMAL CORONARY ARTERIES/  NORMAL LVF  . Hemorrhoid surgery  09-13-2003  . Vaginal hysterectomy  age 31's  . Laparotomy w/ appendctomy and left salpingo-oophectomy  AGE 84'S  . Bladder suspension  10/07/2011    Procedure: SPARC PROCEDURE;  Surgeon: Anner Crete, MD;  Location: [facility];  Service: Urology;  Laterality: N/A;  1 hour requested for this case  SPARC SLING  . Cystoscopy  10/07/2011    Procedure: CYSTOSCOPY;  Surgeon: Anner Crete, MD;  Location: [facility];  Service: Urology;  Laterality: N/A;  .  Stomach surgery     Family History  Problem Relation Age of Onset  . Colon cancer Mother 98  . Alzheimer's disease Father   . Hypertension Father    History  Substance Use Topics  . Smoking status: Current Every Day Smoker -- 1.00 packs/day for 35 years    Types: Cigarettes  . Smokeless tobacco: Never Used  . Alcohol Use: No   OB History   Grav Para Term Preterm Abortions TAB SAB Ect Mult Living                 Review of Systems   Constitutional: Positive for chills and fatigue. Negative for fever.  Respiratory: Positive for cough and shortness of breath.   Cardiovascular: Positive for chest pain.  All other systems reviewed and are negative.    Allergies  Amicinonide-benzyl alcohol; Paroxetine hcl; Trimethoprim; Amoxicillin; Doxycycline; and Penicillins  Home Medications   Current Outpatient Rx  Name  Route  Sig  Dispense  Refill  . ALPRAZolam (XANAX) 1 MG tablet   Oral   Take 1 mg by mouth at bedtime as needed for sleep or anxiety.          Marland Kitchen amitriptyline (ELAVIL) 100 MG tablet   Oral   Take 100 mg by mouth at bedtime.         Marland Kitchen aspirin 81 MG tablet   Oral   Take 81 mg by mouth every morning.          . cephALEXin (KEFLEX) 500 MG capsule   Oral   Take 500 mg by mouth 4 (four) times daily. For 10 days         . citalopram (CELEXA) 10 MG tablet   Oral   Take 10 mg by mouth every morning.          Marland Kitchen dexlansoprazole (DEXILANT) 60 MG capsule   Oral   Take 1 capsule (60 mg total) by mouth 2 (two) times daily before a meal.   60 capsule   3   . docusate sodium (COLACE) 100 MG capsule   Oral   Take 1 capsule (100 mg total) by mouth 2 (two) times daily.   60 capsule   2   . enalapril (VASOTEC) 2.5 MG tablet   Oral   Take 2.5 mg by mouth every morning.          . furosemide (LASIX) 40 MG tablet   Oral   Take 40 mg by mouth every morning.         Marland Kitchen glipiZIDE (GLUCOTROL) 5 MG tablet   Oral   Take 5 mg by mouth every morning.          Marland Kitchen Linaclotide (LINZESS) 290 MCG CAPS   Oral   Take 1 capsule by mouth daily.   30 capsule   11   . methocarbamol (ROBAXIN) 500 MG tablet   Oral   Take 500 mg by mouth 4 (four) times daily.          . naproxen (NAPROSYN) 500 MG tablet   Oral   Take 500 mg by mouth 2 (two) times daily with a meal.          . oxycodone (ROXICODONE) 30 MG immediate release tablet   Oral   Take 30 mg by mouth every 4 (four) hours as needed.           . zolpidem (AMBIEN) 5 MG tablet   Oral   Take 5 mg by mouth at  bedtime as needed for sleep.          Marland Kitchen ibuprofen (ADVIL,MOTRIN) 200 MG tablet   Oral   Take 200 mg by mouth every 6 (six) hours as needed for pain.          Marland Kitchen PROAIR HFA 108 (90 BASE) MCG/ACT inhaler   Inhalation   Inhale 1 puff into the lungs every 4 (four) hours as needed.           BP 156/81  Pulse 76  Temp(Src) 97.2 F (36.2 C) (Oral)  Resp 20  Ht 5\' 2"  (1.575 m)  SpO2 100% Physical Exam  Nursing note and vitals reviewed. Constitutional: She is oriented to person, place, and time. She appears well-developed and well-nourished. She appears distressed.  HENT:  Head: Normocephalic and atraumatic.  Mouth/Throat: Oropharynx is clear and moist.  Eyes: Conjunctivae are normal. Pupils are equal, round, and reactive to light. No scleral icterus.  Neck: Neck supple.  Cardiovascular: Normal rate, regular rhythm, normal heart sounds and intact distal pulses.   No murmur heard. Pulmonary/Chest: No stridor. She has wheezes (diffused). She has rales.  Abdominal: Soft. Bowel sounds are normal. She exhibits no distension and no mass. There is no tenderness. There is no guarding.  Musculoskeletal: Normal range of motion. She exhibits edema.  Neurological: She is alert and oriented to person, place, and time.  Skin: Skin is warm and dry. No rash noted.  Psychiatric: She has a normal mood and affect. Her behavior is normal.    ED Course  DIAGNOSTIC STUDIES: Oxygen Saturation is 100% on room air, normal by my interpretation.    COORDINATION OF CARE: 2:48 PM Discussed course of care with pt . Pt understands and agrees.    Procedures (including critical care time)  Labs Reviewed  BASIC METABOLIC PANEL - Abnormal; Notable for the following:    GFR calc non Af Amer 73 (*)    GFR calc Af Amer 85 (*)    All other components within normal limits  GLUCOSE, CAPILLARY  TROPONIN I  CBC WITH DIFFERENTIAL   Dg  Chest 2 View  08/31/2012   *RADIOLOGY REPORT*  Clinical Data: Left-sided chest pain, cough, shortness of breath, smoking history  CHEST - 2 VIEW  Comparison: Chest x-ray of 08/19/2012  Findings: No active infiltrate or effusion is seen.  Mediastinal contours are stable.  There is some peribronchial thickening consistent with bronchitis. The heart is within upper limits of normal.  No acute bony abnormality is seen.  A minimally compressed mid lower thoracic vertebral body appears stable.  IMPRESSION: Probable bronchitis.  No active lung disease.   Original Report Authenticated By: Dwyane Dee, M.D.   1. Chest pain   2. Bronchitis     Date: 08/31/2012  Rate: 74  Rhythm: normal sinus rhythm  QRS Axis: normal  Intervals: normal  ST/T Wave abnormalities: normal  Conduction Disutrbances: none  Narrative Interpretation: unremarkable    MDM  Patient with left-sided lower chest pain. Has had a cough. Is currently on a course of antibiotics from PCP. EKG reassuring. X-ray does not show pneumonia. Patient feels much better after breathing treatment. Doubt cardiac cause or pulmonary embolism. Will be discharged home to follow with PCP  I personally performed the services described in this documentation, which was scribed in my presence. The recorded information has been reviewed and is accurate.     Juliet Rude. Tanya Payor, MD 08/31/12 1735

## 2012-08-31 NOTE — ED Notes (Signed)
Pain lt chest , onset this am.  Cough congestion for for 1 week.

## 2012-09-20 ENCOUNTER — Other Ambulatory Visit (HOSPITAL_COMMUNITY): Payer: Self-pay | Admitting: Internal Medicine

## 2012-09-20 DIAGNOSIS — Z139 Encounter for screening, unspecified: Secondary | ICD-10-CM

## 2012-09-22 ENCOUNTER — Other Ambulatory Visit: Payer: Self-pay

## 2012-09-22 MED ORDER — LINACLOTIDE 290 MCG PO CAPS: 290.0000 ug | ORAL_CAPSULE | Freq: Every day | ORAL | Status: DC

## 2012-09-23 ENCOUNTER — Ambulatory Visit (HOSPITAL_COMMUNITY): Payer: Medicare Other

## 2012-11-30 ENCOUNTER — Ambulatory Visit (HOSPITAL_COMMUNITY): Payer: Medicare Other

## 2012-12-24 ENCOUNTER — Emergency Department (HOSPITAL_COMMUNITY): Payer: Medicare Other

## 2012-12-24 ENCOUNTER — Encounter (HOSPITAL_COMMUNITY): Payer: Self-pay | Admitting: Emergency Medicine

## 2012-12-24 ENCOUNTER — Emergency Department (HOSPITAL_COMMUNITY)
Admission: EM | Admit: 2012-12-24 | Discharge: 2012-12-24 | Disposition: A | Discharge status: Home / Self Care | Payer: Medicare Other | Attending: Emergency Medicine | Admitting: Emergency Medicine

## 2012-12-24 DIAGNOSIS — Z7982 Long term (current) use of aspirin: Secondary | ICD-10-CM | POA: Insufficient documentation

## 2012-12-24 DIAGNOSIS — S4980XA Other specified injuries of shoulder and upper arm, unspecified arm, initial encounter: Secondary | ICD-10-CM | POA: Insufficient documentation

## 2012-12-24 DIAGNOSIS — G43909 Migraine, unspecified, not intractable, without status migrainosus: Secondary | ICD-10-CM | POA: Insufficient documentation

## 2012-12-24 DIAGNOSIS — W010XXA Fall on same level from slipping, tripping and stumbling without subsequent striking against object, initial encounter: Secondary | ICD-10-CM | POA: Insufficient documentation

## 2012-12-24 DIAGNOSIS — Z860100 Personal history of colon polyps, unspecified: Secondary | ICD-10-CM | POA: Insufficient documentation

## 2012-12-24 DIAGNOSIS — Z792 Long term (current) use of antibiotics: Secondary | ICD-10-CM | POA: Insufficient documentation

## 2012-12-24 DIAGNOSIS — S0120XA Unspecified open wound of nose, initial encounter: Secondary | ICD-10-CM | POA: Insufficient documentation

## 2012-12-24 DIAGNOSIS — Z87448 Personal history of other diseases of urinary system: Secondary | ICD-10-CM | POA: Insufficient documentation

## 2012-12-24 DIAGNOSIS — Y9289 Other specified places as the place of occurrence of the external cause: Secondary | ICD-10-CM | POA: Insufficient documentation

## 2012-12-24 DIAGNOSIS — Z79899 Other long term (current) drug therapy: Secondary | ICD-10-CM | POA: Insufficient documentation

## 2012-12-24 DIAGNOSIS — Z791 Long term (current) use of non-steroidal anti-inflammatories (NSAID): Secondary | ICD-10-CM | POA: Insufficient documentation

## 2012-12-24 DIAGNOSIS — M545 Low back pain, unspecified: Secondary | ICD-10-CM | POA: Insufficient documentation

## 2012-12-24 DIAGNOSIS — J449 Chronic obstructive pulmonary disease, unspecified: Secondary | ICD-10-CM | POA: Insufficient documentation

## 2012-12-24 DIAGNOSIS — K5900 Constipation, unspecified: Secondary | ICD-10-CM | POA: Insufficient documentation

## 2012-12-24 DIAGNOSIS — Y9389 Activity, other specified: Secondary | ICD-10-CM | POA: Insufficient documentation

## 2012-12-24 DIAGNOSIS — S022XXB Fracture of nasal bones, initial encounter for open fracture: Secondary | ICD-10-CM

## 2012-12-24 DIAGNOSIS — Z880 Allergy status to penicillin: Secondary | ICD-10-CM | POA: Insufficient documentation

## 2012-12-24 DIAGNOSIS — G8929 Other chronic pain: Secondary | ICD-10-CM | POA: Insufficient documentation

## 2012-12-24 DIAGNOSIS — F17200 Nicotine dependence, unspecified, uncomplicated: Secondary | ICD-10-CM | POA: Insufficient documentation

## 2012-12-24 DIAGNOSIS — E119 Type 2 diabetes mellitus without complications: Secondary | ICD-10-CM | POA: Insufficient documentation

## 2012-12-24 DIAGNOSIS — D518 Other vitamin B12 deficiency anemias: Secondary | ICD-10-CM | POA: Insufficient documentation

## 2012-12-24 DIAGNOSIS — S46909A Unspecified injury of unspecified muscle, fascia and tendon at shoulder and upper arm level, unspecified arm, initial encounter: Secondary | ICD-10-CM | POA: Insufficient documentation

## 2012-12-24 DIAGNOSIS — S0121XA Laceration without foreign body of nose, initial encounter: Secondary | ICD-10-CM

## 2012-12-24 DIAGNOSIS — J4489 Other specified chronic obstructive pulmonary disease: Secondary | ICD-10-CM | POA: Insufficient documentation

## 2012-12-24 DIAGNOSIS — K219 Gastro-esophageal reflux disease without esophagitis: Secondary | ICD-10-CM | POA: Insufficient documentation

## 2012-12-24 DIAGNOSIS — I10 Essential (primary) hypertension: Secondary | ICD-10-CM | POA: Insufficient documentation

## 2012-12-24 DIAGNOSIS — F411 Generalized anxiety disorder: Secondary | ICD-10-CM | POA: Insufficient documentation

## 2012-12-24 DIAGNOSIS — W1809XA Striking against other object with subsequent fall, initial encounter: Secondary | ICD-10-CM | POA: Insufficient documentation

## 2012-12-24 DIAGNOSIS — Z96619 Presence of unspecified artificial shoulder joint: Secondary | ICD-10-CM | POA: Insufficient documentation

## 2012-12-24 DIAGNOSIS — M1990 Unspecified osteoarthritis, unspecified site: Secondary | ICD-10-CM | POA: Insufficient documentation

## 2012-12-24 MED ORDER — LIDOCAINE-EPINEPHRINE-TETRACAINE (LET) SOLUTION
3.0000 mL | Freq: Once | NASAL | Status: AC
  Administered 2012-12-24: 3 mL via TOPICAL
  Filled 2012-12-24: qty 3

## 2012-12-24 MED ORDER — IBUPROFEN 800 MG PO TABS
800.0000 mg | ORAL_TABLET | Freq: Once | ORAL | Status: AC
Start: 2012-12-24 — End: 2012-12-24
  Administered 2012-12-24: 800 mg via ORAL
  Filled 2012-12-24: qty 1

## 2012-12-24 MED ORDER — HYDROCODONE-ACETAMINOPHEN 5-325 MG PO TABS
1.0000 | ORAL_TABLET | Freq: Once | ORAL | Status: AC
  Administered 2012-12-24: 1 via ORAL
  Filled 2012-12-24: qty 1

## 2012-12-24 MED ORDER — ONDANSETRON 8 MG PO TBDP
8.0000 mg | ORAL_TABLET | Freq: Once | ORAL | Status: AC
  Administered 2012-12-24: 8 mg via ORAL
  Filled 2012-12-24: qty 1

## 2012-12-24 MED ORDER — CEPHALEXIN 500 MG PO CAPS: 500.0000 mg | ORAL_CAPSULE | Freq: Four times a day (QID) | ORAL | Status: DC

## 2012-12-24 NOTE — ED Notes (Addendum)
Tripped and fell . Lac to nose.  No LOC, lt shoulder hurts.   Says her ant neck hurts.  MAE color good  C collar placed .

## 2012-12-24 NOTE — ED Notes (Signed)
c-collar removed. After ct results.

## 2012-12-25 NOTE — ED Provider Notes (Signed)
CSN: [id number]     Arrival date & time 12/24/12  1219 History   First MD Initiated Contact with Patient 12/24/12 1233     Chief Complaint  Patient presents with  . Fall   (Consider location/radiation/quality/duration/timing/severity/associated sxs/prior Treatment) HPI Comments: Tanya Velazquez is a 52 y.o. Female with pain and injury to her nose and left shoulder after tripping and falling,  Hitting her nose against the trailer hitch of her truck.  She has sustained a laceration across the bridge of her nose.  She denies loc, dizziness, nausea or vomiting, weakness since the event which occurred just before arrival.  She has applied pressure to her nose with adequate hemostasis.  She had no epistaxis. She has left shoulder pain which is triggered by movement and palpation.  She denies numbness or weakness in her upper extremities. She is tender along her neck and shoulders and a c collar was placed in triage.     The history is provided by the patient.    Past Medical History  Diagnosis Date  . Diabetes mellitus   . Migraines   . Osteoarthritis   . GERD (gastroesophageal reflux disease) 04/13/07    EGD Dr Rourk->patulous EG junction, HH, antral erosions  . Anxiety   . Hyperplastic colon polyp 06/27/2010  . H/O hiatal hernia   . SUI (stress urinary incontinence, female)   . COPD (chronic obstructive pulmonary disease)   . DJD (degenerative joint disease)   . Chronic low back pain   . B12 deficiency anemia   . Chronic constipation   . Hypertension   . Smokers' cough   . Raspy voice    Past Surgical History  Procedure Laterality Date  . Colonoscopy  3/31/9    Dr Rourk->friable anal canal  . Colonoscopy  0 06/27/10    Dr. Jena Gauss hyperplastic polyp, prominent external hemorrhoid plexus  . Esophagogastroduodenoscopy  06/27/10    Hiatal hernia, GERD,  . Shoulder arthroscopy  08-13-2010    LEFT SHOULDER IMPINGEMENT SYNDROME/ DJD OF AC JOINT  . Right shoulder arthroscopy/ distal  clavicle resection / debridement rotator cuff and labrum tear/ bursectomy  06-28-2010    IMPINGEMENT SYNDROME/ AC JOINT ARTHRITIS/ ROTATOR CUFF TENDINOPATHY  . Laparoscopic cholecystectomy  02-23-2007  . Head-up tilt table test  01-21-2007 &  11-04-2004  DR WEINTRAUB    HX CHRONIC RECURRENT SYNCOPAL. EPICODES--  NEGATIVE RESULT INCLUDING ISUPREL INFUSION  . Excision volar ganglion left wrist  06-27-2005  . Hemorrhoid surgery  09-13-2003  . Vaginal hysterectomy  age 56's  . Laparotomy w/ appendctomy and left salpingo-oophectomy  AGE 56'S  . Bladder suspension  10/07/2011    Procedure: SPARC PROCEDURE;  Surgeon: Anner Crete, MD;  Location: [facility];  Service: Urology;  Laterality: N/A;  1 hour requested for this case  SPARC SLING  . Cystoscopy  10/07/2011    Procedure: CYSTOSCOPY;  Surgeon: Anner Crete, MD;  Location: [facility];  Service: Urology;  Laterality: N/A;  . Stomach surgery    . Cardiac catheterization  08-09-2004 DR Jacinto Halim    NORMAL CORONARY ARTERIES/  NORMAL LVF   Family History  Problem Relation Age of Onset  . Colon cancer Mother 66  . Alzheimer's disease Father   . Hypertension Father    History  Substance Use Topics  . Smoking status: Current Every Day Smoker -- 1.00 packs/day for 35 years    Types: Cigarettes  . Smokeless tobacco: Never Used  . Alcohol Use:  No   OB History   Grav Para Term Preterm Abortions TAB SAB Ect Mult Living                 Review of Systems  HENT: Positive for facial swelling. Negative for nosebleeds.   Eyes: Negative.  Negative for photophobia and visual disturbance.  Respiratory: Negative for chest tightness and shortness of breath.   Genitourinary: Negative.   Skin: Positive for wound.  Neurological: Negative for dizziness, tremors, seizures and light-headedness.  Psychiatric/Behavioral: Negative.     Allergies  Amicinonide-benzyl alcohol; Amoxicillin; Doxycycline; Paroxetine hcl; Penicillins;  and Trimethoprim  Home Medications   Current Outpatient Rx  Name  Route  Sig  Dispense  Refill  . ALPRAZolam (XANAX) 1 MG tablet   Oral   Take 1 mg by mouth at bedtime as needed for sleep or anxiety.          Marland Kitchen amitriptyline (ELAVIL) 100 MG tablet   Oral   Take 100 mg by mouth at bedtime.         Marland Kitchen aspirin 81 MG tablet   Oral   Take 81 mg by mouth every morning.          . cephALEXin (KEFLEX) 500 MG capsule   Oral   Take 500 mg by mouth 4 (four) times daily. For 10 days         . citalopram (CELEXA) 10 MG tablet   Oral   Take 10 mg by mouth every morning.          . cyanocobalamin (,VITAMIN B-12,) 1000 MCG/ML injection   Intramuscular   Inject 1,000 mcg into the muscle once.         Marland Kitchen dexlansoprazole (DEXILANT) 60 MG capsule   Oral   Take 1 capsule (60 mg total) by mouth 2 (two) times daily before a meal.   60 capsule   3   . docusate sodium (COLACE) 100 MG capsule   Oral   Take 1 capsule (100 mg total) by mouth 2 (two) times daily.   60 capsule   2   . enalapril (VASOTEC) 2.5 MG tablet   Oral   Take 2.5 mg by mouth every morning.          . furosemide (LASIX) 40 MG tablet   Oral   Take 40 mg by mouth every morning.         Marland Kitchen glipiZIDE (GLUCOTROL) 5 MG tablet   Oral   Take 5 mg by mouth every morning.          Marland Kitchen ibuprofen (ADVIL,MOTRIN) 200 MG tablet   Oral   Take 200 mg by mouth every 6 (six) hours as needed for pain.          . Linaclotide (LINZESS) 290 MCG CAPS capsule   Oral   Take 1 capsule (290 mcg total) by mouth daily.   30 capsule   5   . methocarbamol (ROBAXIN) 500 MG tablet   Oral   Take 500 mg by mouth 4 (four) times daily.          . naproxen (NAPROSYN) 500 MG tablet   Oral   Take 500 mg by mouth 2 (two) times daily with a meal.          . oxycodone (ROXICODONE) 30 MG immediate release tablet   Oral   Take 30 mg by mouth every 4 (four) hours as needed.          . pravastatin (  PRAVACHOL) 20 MG tablet    Oral   Take 1 tablet by mouth daily.         Marland Kitchen PROAIR HFA 108 (90 BASE) MCG/ACT inhaler   Inhalation   Inhale 1 puff into the lungs every 4 (four) hours as needed.          . topiramate (TOPAMAX) 50 MG tablet   Oral   Take 1 tablet by mouth daily as needed (for headache).          . zolpidem (AMBIEN) 5 MG tablet   Oral   Take 5 mg by mouth at bedtime as needed for sleep.          . cephALEXin (KEFLEX) 500 MG capsule   Oral   Take 1 capsule (500 mg total) by mouth 4 (four) times daily.   28 capsule   0    BP 106/66  Pulse 66  Temp(Src) 98.2 F (36.8 C) (Oral)  Resp 14  Ht 5\' 2"  (1.575 m)  Wt 176 lb (79.833 kg)  BMI 32.18 kg/m2  SpO2 92% Physical Exam  Nursing note and vitals reviewed. Constitutional: She is oriented to person, place, and time. She appears well-developed and well-nourished. No distress.  HENT:  Head: Normocephalic and atraumatic.  Right Ear: Tympanic membrane normal. No hemotympanum.  Left Ear: Tympanic membrane normal. No hemotympanum.  Nose: No mucosal edema, rhinorrhea, septal deviation or nasal septal hematoma. No epistaxis.  Mouth/Throat: Uvula is midline and oropharynx is clear and moist.  Edema with well approximated,  Linear laceration across nasal bridge.    Eyes: Conjunctivae and EOM are normal. Pupils are equal, round, and reactive to light.  Neck: Normal range of motion. Neck supple.  Cardiovascular: Normal rate, regular rhythm, normal heart sounds and intact distal pulses.   Pulmonary/Chest: Effort normal and breath sounds normal. She has no wheezes.  Abdominal: Soft. Bowel sounds are normal. There is no tenderness.  Musculoskeletal: Normal range of motion.       Left shoulder: She exhibits tenderness. She exhibits no swelling, no effusion, no crepitus, no deformity, no spasm and normal pulse.  ttp left lateral shoulder with no visible deformity or trauma, no edema, no ecchymosis.    Lymphadenopathy:    She has no cervical  adenopathy.  Neurological: She is alert and oriented to person, place, and time. She has normal strength. No sensory deficit. Gait normal. GCS eye subscore is 4. GCS verbal subscore is 5. GCS motor subscore is 6.  Cranial nerves III-XII intact.   Skin: Skin is warm and dry. No rash noted.  Psychiatric: She has a normal mood and affect. Her speech is normal and behavior is normal. Thought content normal. Cognition and memory are normal.    ED Course  Procedures (including critical care time)   LACERATION REPAIR Performed by: Burgess Amor Authorized by: Burgess Amor Consent: Verbal consent obtained. Risks and benefits: risks, benefits and alternatives were discussed Consent given by: patient Patient identity confirmed: provided demographic data Prepped and Draped in normal sterile fashion Wound explored  Laceration Location: nasal bridge  Laceration Length: 1.5 cm  No Foreign Bodies seen or palpated  Anesthesia:topical LET Local anesthetic: LET Anesthetic total: 3 ml  Irrigation method: syringe Amount of cleaning: standard  Skin closure: ethilon 6-0  Number of sutures: 5  Technique: simple interrupted  Patient tolerance: Patient tolerated the procedure well with no immediate complications.  Labs Review Labs Reviewed - No data to display Imaging Review Ct Head Wo Contrast  12/24/2012   CLINICAL DATA:  Fall.  Laceration to nose.  Increasing headache.  EXAM: CT HEAD WITHOUT CONTRAST  TECHNIQUE: Contiguous axial images were obtained from the base of the skull through the vertex without intravenous contrast.  COMPARISON:  Maxillofacial CT earlier the same day. Head CT 04/07/2011  FINDINGS: The ventricles and sulci are within normal limits for age. There is no evidence of acute infarct, intracranial hemorrhage, mass, midline shift, or extra-axial collection. The orbits are unremarkable. The visualized paranasal sinuses are clear. As demonstrated on recent maxillofacial CT, there  is a minimally displaced right nasal bone fracture. No other fracture is identified. Small mastoid effusion is noted.  IMPRESSION: 1. No evidence of acute intracranial abnormality. 2. Right nasal bone fracture.   Electronically Signed   By: Sebastian Ache   On: 12/24/2012 15:27   Ct Cervical Spine Wo Contrast  12/24/2012   CLINICAL DATA:  Fall.  Laceration to nose.  Neck pain.  EXAM: CT MAXILLOFACIAL WITHOUT CONTRAST  CT CERVICAL SPINE WITHOUT CONTRAST  TECHNIQUE: Multidetector CT imaging of the cervical spine and maxillofacial structures were performed using the standard protocol without intravenous contrast. Multiplanar CT image reconstructions of the cervical spine and maxillofacial structures were also generated.  COMPARISON:  Head CT 04/07/2011 and cervical spine MRI 05/03/2010  FINDINGS: CT MAXILLOFACIAL FINDINGS  The visualized portion of the brain is unremarkable. The paranasal sinuses are clear. There is a small right mastoid effusion, unchanged. There is a minimally displaced and minimally angulated right nasal bone fracture. Overlying soft tissue swelling/hematoma and bandage material are noted. No other maxillofacial fracture is identified. Orbits are unremarkable.  CT CERVICAL SPINE FINDINGS  Vertebral alignment is normal. Vertebral body heights are preserved. No cervical spine fracture is identified. Small posterior disc protrusion is present at C3-4 without evidence of stenosis. Prevertebral soft tissues are unremarkable. Mild atelectasis is noted in the lung apices. Atherosclerotic calcification is partially visualized involving the proximal left subclavian artery.  IMPRESSION: 1. Right nasal bone fracture. 2. No evidence of acute osseous abnormality in the cervical spine.   Electronically Signed   By: Sebastian Ache   On: 12/24/2012 14:08   Dg Shoulder Left  12/24/2012   CLINICAL DATA:  Pain status post fall.  EXAM: LEFT SHOULDER - 2+ VIEW  COMPARISON:  MRI of the left shoulder dated January 30, 2009.  FINDINGS: The glenohumeral joint appears intact with no evidence of fracture nor dislocation. The [facility] joint is not well demonstrated on this study. There does appear to be a spur along the inferior margin of the distal clavicle. There is lucency through the distal aspect of the clavicle that is likely chronic. The [facility] joint is not clearly widened. The observed portions of the clavicle more proximally appear normal.  IMPRESSION: 1. There is no evidence of glenohumeral joint fracture or dislocation. 2. There may be chronic posttraumatic and degenerative change of the [facility] joint. A definite acute fracture is not demonstrated. Correlation with patient's clinical history is needed. Is the patient is symptomatic directly over the [facility] joint? If so, an [facility] joint series would be recommended.   Electronically Signed   By: David  [place]   On: 12/24/2012 13:28   Ct Maxillofacial Wo Cm  12/24/2012   CLINICAL DATA:  Fall.  Laceration to nose.  Neck pain.  EXAM: CT MAXILLOFACIAL WITHOUT CONTRAST  CT CERVICAL SPINE WITHOUT CONTRAST  TECHNIQUE: Multidetector CT imaging of the cervical spine and maxillofacial structures were performed using the standard  protocol without intravenous contrast. Multiplanar CT image reconstructions of the cervical spine and maxillofacial structures were also generated.  COMPARISON:  Head CT 04/07/2011 and cervical spine MRI 05/03/2010  FINDINGS: CT MAXILLOFACIAL FINDINGS  The visualized portion of the brain is unremarkable. The paranasal sinuses are clear. There is a small right mastoid effusion, unchanged. There is a minimally displaced and minimally angulated right nasal bone fracture. Overlying soft tissue swelling/hematoma and bandage material are noted. No other maxillofacial fracture is identified. Orbits are unremarkable.  CT CERVICAL SPINE FINDINGS  Vertebral alignment is normal. Vertebral body heights are preserved. No cervical spine fracture is identified. Small posterior disc protrusion  is present at C3-4 without evidence of stenosis. Prevertebral soft tissues are unremarkable. Mild atelectasis is noted in the lung apices. Atherosclerotic calcification is partially visualized involving the proximal left subclavian artery.  IMPRESSION: 1. Right nasal bone fracture. 2. No evidence of acute osseous abnormality in the cervical spine.   Electronically Signed   By: Sebastian Ache   On: 12/24/2012 14:08    EKG Interpretation   None       MDM   1. Laceration of nose with complication, initial encounter   2. Nasal fracture, open, initial encounter    Discussed with Dr. Suszanne Conners.  He will see pt in his [place] office for recheck next Thursday.  Advised abx for injury.  Prescribed keflex, encouraged other home meds for pain relief prn.  Ice applied to nose for swelling reduction.  Return here for any worsened or new sx.  The patient appears reasonably screened and/or stabilized for discharge and I doubt any other medical condition or other [facility] requiring further screening, evaluation, or treatment in the ED at this time prior to discharge.  Patients labs and/or radiological studies were viewed and considered during the medical decision making and disposition process.  Pt is not point tender at ac joint.  Pain is lateral humeral head.     Burgess Amor, PA-C 12/26/12 [phone]

## 2012-12-27 NOTE — ED Provider Notes (Signed)
Medical screening examination/treatment/procedure(s) were performed by non-physician practitioner and as supervising physician I was immediately available for consultation/collaboration.  EKG Interpretation   None        Shaunak Kreis R. Vishwa Dais, MD 12/27/12 2114 

## 2013-01-20 ENCOUNTER — Ambulatory Visit (HOSPITAL_COMMUNITY): Payer: Medicare Other

## 2013-01-25 ENCOUNTER — Ambulatory Visit (HOSPITAL_COMMUNITY): Payer: Medicare Other

## 2013-03-14 ENCOUNTER — Other Ambulatory Visit: Payer: Self-pay | Admitting: Gastroenterology

## 2013-05-16 ENCOUNTER — Other Ambulatory Visit (HOSPITAL_COMMUNITY): Payer: Self-pay | Admitting: Internal Medicine

## 2013-05-16 DIAGNOSIS — Z139 Encounter for screening, unspecified: Secondary | ICD-10-CM

## 2013-05-19 ENCOUNTER — Ambulatory Visit (HOSPITAL_COMMUNITY): Payer: Medicare Other

## 2013-05-26 ENCOUNTER — Ambulatory Visit (HOSPITAL_COMMUNITY)
Admission: RE | Admit: 2013-05-26 | Discharge: 2013-05-26 | Disposition: A | Discharge status: Home / Self Care | Payer: Medicare HMO | Source: Ambulatory Visit | Attending: Internal Medicine | Admitting: Internal Medicine

## 2013-05-26 DIAGNOSIS — Z1231 Encounter for screening mammogram for malignant neoplasm of breast: Secondary | ICD-10-CM | POA: Insufficient documentation

## 2013-05-26 DIAGNOSIS — Z139 Encounter for screening, unspecified: Secondary | ICD-10-CM

## 2013-12-20 ENCOUNTER — Encounter: Payer: Self-pay | Admitting: *Deleted

## 2013-12-22 ENCOUNTER — Ambulatory Visit: Payer: Self-pay | Admitting: Obstetrics and Gynecology

## 2013-12-28 ENCOUNTER — Ambulatory Visit: Payer: Self-pay | Admitting: Obstetrics and Gynecology

## 2014-01-30 DIAGNOSIS — F419 Anxiety disorder, unspecified: Secondary | ICD-10-CM | POA: Diagnosis not present

## 2014-01-30 DIAGNOSIS — Z6825 Body mass index (BMI) 25.0-25.9, adult: Secondary | ICD-10-CM | POA: Diagnosis not present

## 2014-01-30 DIAGNOSIS — Z7901 Long term (current) use of anticoagulants: Secondary | ICD-10-CM | POA: Diagnosis not present

## 2014-01-30 DIAGNOSIS — G894 Chronic pain syndrome: Secondary | ICD-10-CM | POA: Diagnosis not present

## 2014-01-30 DIAGNOSIS — S060X0A Concussion without loss of consciousness, initial encounter: Secondary | ICD-10-CM | POA: Diagnosis not present

## 2014-02-16 ENCOUNTER — Emergency Department (HOSPITAL_COMMUNITY)
Admission: EM | Admit: 2014-02-16 | Discharge: 2014-02-17 | Disposition: A | Discharge status: Home / Self Care | Payer: Medicare HMO | Attending: Emergency Medicine | Admitting: Emergency Medicine

## 2014-02-16 ENCOUNTER — Encounter (HOSPITAL_COMMUNITY): Payer: Self-pay | Admitting: *Deleted

## 2014-02-16 DIAGNOSIS — Z7982 Long term (current) use of aspirin: Secondary | ICD-10-CM | POA: Insufficient documentation

## 2014-02-16 DIAGNOSIS — K219 Gastro-esophageal reflux disease without esophagitis: Secondary | ICD-10-CM | POA: Diagnosis not present

## 2014-02-16 DIAGNOSIS — Z862 Personal history of diseases of the blood and blood-forming organs and certain disorders involving the immune mechanism: Secondary | ICD-10-CM | POA: Insufficient documentation

## 2014-02-16 DIAGNOSIS — Z86010 Personal history of colonic polyps: Secondary | ICD-10-CM | POA: Insufficient documentation

## 2014-02-16 DIAGNOSIS — R519 Headache, unspecified: Secondary | ICD-10-CM

## 2014-02-16 DIAGNOSIS — F419 Anxiety disorder, unspecified: Secondary | ICD-10-CM | POA: Insufficient documentation

## 2014-02-16 DIAGNOSIS — J449 Chronic obstructive pulmonary disease, unspecified: Secondary | ICD-10-CM | POA: Insufficient documentation

## 2014-02-16 DIAGNOSIS — G8929 Other chronic pain: Secondary | ICD-10-CM | POA: Diagnosis not present

## 2014-02-16 DIAGNOSIS — L298 Other pruritus: Secondary | ICD-10-CM

## 2014-02-16 DIAGNOSIS — Z880 Allergy status to penicillin: Secondary | ICD-10-CM | POA: Diagnosis not present

## 2014-02-16 DIAGNOSIS — Z87448 Personal history of other diseases of urinary system: Secondary | ICD-10-CM | POA: Diagnosis not present

## 2014-02-16 DIAGNOSIS — I10 Essential (primary) hypertension: Secondary | ICD-10-CM | POA: Insufficient documentation

## 2014-02-16 DIAGNOSIS — Z79899 Other long term (current) drug therapy: Secondary | ICD-10-CM | POA: Diagnosis not present

## 2014-02-16 DIAGNOSIS — Z720 Tobacco use: Secondary | ICD-10-CM | POA: Diagnosis not present

## 2014-02-16 DIAGNOSIS — G43909 Migraine, unspecified, not intractable, without status migrainosus: Secondary | ICD-10-CM | POA: Insufficient documentation

## 2014-02-16 DIAGNOSIS — L299 Pruritus, unspecified: Secondary | ICD-10-CM | POA: Diagnosis not present

## 2014-02-16 DIAGNOSIS — R51 Headache: Secondary | ICD-10-CM

## 2014-02-16 DIAGNOSIS — M1990 Unspecified osteoarthritis, unspecified site: Secondary | ICD-10-CM | POA: Insufficient documentation

## 2014-02-16 DIAGNOSIS — E119 Type 2 diabetes mellitus without complications: Secondary | ICD-10-CM | POA: Diagnosis not present

## 2014-02-16 NOTE — ED Provider Notes (Signed)
CSN: [id number]     Arrival date & time 02/16/14  1853 History  This chart was scribed for Janice Norrie, MD by Edison Simon, ED Scribe. This patient was seen in room APA03/APA03 and the patient's care was started at 12:20 AM.    Chief Complaint  Patient presents with  . Headache   The history is provided by the patient. No language interpreter was used.    HPI Comments: Tanya Velazquez is a 54 y.o. female who presents to the Emergency Department complaining of generalized headache with onset 2 days ago. She describes it as dull. She reports associated photophobia, some phonophobia, and nausea. She states she has had similar headaches previously throughout her life, but not in the past 2 years. She notes she has been dizzy upon standing for the past 3 weeks. She also complains of itchy rash to chest, arms, and shoulder with onset 1 month ago; she was evaluated for this by her PCP. She denies recent shampoo or detergent changes. She has appointment with dermatologist in 3 days.  She has been taking benadryl for the itching but she states the rash is getting worse. She states her PCP stopped her diabetes medication 5-6 months ago and states her blood sugar has been in the low 100s. She reports polydipsia but denies polyuria. She states she has lost 44 pounds in the past 4 months; she states she was trying to lose weight but not that much. She denies vomiting, vision change, or numbness/tingling in arms or legs  PCP: Glo Herring., MD   Past Medical History  Diagnosis Date  . Diabetes mellitus   . Migraines   . Osteoarthritis   . GERD (gastroesophageal reflux disease) 04/13/07    EGD Dr Rourk->patulous EG junction, HH, antral erosions  . Anxiety   . Hyperplastic colon polyp 06/27/2010  . H/O hiatal hernia   . SUI (stress urinary incontinence, female)   . COPD (chronic obstructive pulmonary disease)   . DJD (degenerative joint disease)   . Chronic low back pain   . B12 deficiency anemia   .  Chronic constipation   . Hypertension   . Smokers' cough   . Raspy voice    Past Surgical History  Procedure Laterality Date  . Colonoscopy  3/31/9    Dr Rourk->friable anal canal  . Colonoscopy  0 06/27/10    Dr. Gala Romney hyperplastic polyp, prominent external hemorrhoid plexus  . Esophagogastroduodenoscopy  06/27/10    Hiatal hernia, GERD,  . Shoulder arthroscopy  08-13-2010    LEFT SHOULDER IMPINGEMENT SYNDROME/ DJD OF AC JOINT  . Right shoulder arthroscopy/ distal clavicle resection / debridement rotator cuff and labrum tear/ bursectomy  06-28-2010    IMPINGEMENT SYNDROME/ AC JOINT ARTHRITIS/ ROTATOR CUFF TENDINOPATHY  . Laparoscopic cholecystectomy  02-23-2007  . Head-up tilt table test  01-21-2007 &  11-04-2004  DR WEINTRAUB    HX CHRONIC RECURRENT SYNCOPAL. EPICODES--  NEGATIVE RESULT INCLUDING ISUPREL INFUSION  . Excision volar ganglion left wrist  06-27-2005  . Hemorrhoid surgery  09-13-2003  . Vaginal hysterectomy  age 12's  . Laparotomy w/ appendctomy and left salpingo-oophectomy  AGE 23'S  . Bladder suspension  10/07/2011    Procedure: [place] PROCEDURE;  Surgeon: Malka So, MD;  Location: [facility];  Service: Urology;  Laterality: N/A;  1 hour requested for this case  [place]  . Cystoscopy  10/07/2011    Procedure: CYSTOSCOPY;  Surgeon: Malka So, MD;  Location: [facility];  Service: Urology;  Laterality: N/A;  . Stomach surgery    . Cardiac catheterization  08-09-2004 DR Einar Gip    NORMAL CORONARY ARTERIES/  NORMAL LVF  . Appendectomy     Family History  Problem Relation Age of Onset  . Colon cancer Mother 61  . Alzheimer's disease Father   . Hypertension Father    History  Substance Use Topics  . Smoking status: Current Every Day Smoker -- 1.00 packs/day for 35 years    Types: Cigarettes  . Smokeless tobacco: Never Used  . Alcohol Use: No   She states she smokes 1ppd.  She denies alcohol use.  She states she is on  disability for syncope.  OB History    No data available     Review of Systems  Eyes: Positive for photophobia. Negative for visual disturbance.  Gastrointestinal: Positive for nausea. Negative for vomiting.  Endocrine: Positive for polydipsia. Negative for polyuria.  Skin: Positive for rash.  Neurological: Positive for dizziness and headaches. Negative for numbness.  All other systems reviewed and are negative.     Allergies  Amicinonide-benzyl alcohol; Amoxicillin; Doxycycline; Paroxetine hcl; Penicillins; and Trimethoprim  Home Medications   Prior to Admission medications   Medication Sig Start Date End Date Taking? Authorizing Provider  ALPRAZolam Duanne Moron) 1 MG tablet Take 1 mg by mouth at bedtime as needed for sleep or anxiety.    Yes Historical Provider, MD  amitriptyline (ELAVIL) 100 MG tablet Take 100 mg by mouth at bedtime.   Yes Historical Provider, MD  aspirin EC 81 MG tablet Take 81 mg by mouth daily.   Yes Historical Provider, MD  citalopram (CELEXA) 10 MG tablet Take 10 mg by mouth every morning.  05/13/10  Yes Historical Provider, MD  citalopram (CELEXA) 40 MG tablet Take 40 mg by mouth daily. 02/02/14  Yes Historical Provider, MD  dexlansoprazole (DEXILANT) 60 MG capsule Take 1 capsule (60 mg total) by mouth 2 (two) times daily before a meal. 08/19/12  Yes Orvil Feil, NP  docusate sodium (COLACE) 100 MG capsule Take 1 capsule (100 mg total) by mouth 2 (two) times daily. 10/07/11  Yes Malka So, MD  enalapril (VASOTEC) 2.5 MG tablet Take 2.5 mg by mouth every morning.  06/11/10  Yes Historical Provider, MD  furosemide (LASIX) 40 MG tablet Take 40 mg by mouth every morning.   Yes Historical Provider, MD  ibuprofen (ADVIL,MOTRIN) 200 MG tablet Take 200 mg by mouth every 6 (six) hours as needed for pain.    Yes Historical Provider, MD  LINZESS 290 MCG CAPS capsule TAKE ONE (1) CAPSULE EACH DAY 03/14/13  Yes Orvil Feil, NP  oxycodone (ROXICODONE) 30 MG immediate release  tablet Take 30 mg by mouth every 4 (four) hours as needed.  06/11/10  Yes Historical Provider, MD  pravastatin (PRAVACHOL) 20 MG tablet Take 1 tablet by mouth daily. 12/17/12  Yes Historical Provider, MD  PROAIR HFA 108 (90 BASE) MCG/ACT inhaler Inhale 1 puff into the lungs every 4 (four) hours as needed.  05/13/10  Yes Historical Provider, MD  zolpidem (AMBIEN) 10 MG tablet Take 10 mg by mouth at bedtime. 02/02/14  Yes Historical Provider, MD  cephALEXin (KEFLEX) 500 MG capsule Take 1 capsule (500 mg total) by mouth 4 (four) times daily. Patient not taking: Reported on 02/16/2014 12/24/12   Evalee Jefferson, PA-C  cyanocobalamin (,VITAMIN B-12,) 1000 MCG/ML injection Inject 1,000 mcg into the muscle every 30 (thirty) days.  Historical Provider, MD   BP 114/72 mmHg  Pulse 70  Temp(Src) 98 F (36.7 C) (Oral)  Resp 16  Ht 5\' 2"  (1.575 m)  Wt 136 lb (61.689 kg)  BMI 24.87 kg/m2  SpO2 97%  Vital signs normal   Physical Exam  Constitutional: She is oriented to person, place, and time. She appears well-developed and well-nourished.  Non-toxic appearance. She does not appear ill. No distress.  HENT:  Head: Normocephalic and atraumatic.  Right Ear: External ear normal.  Left Ear: External ear normal.  Nose: Nose normal. No mucosal edema or rhinorrhea.  Mouth/Throat: Oropharynx is clear and moist and mucous membranes are normal. No dental abscesses or uvula swelling.  Eyes: Conjunctivae and EOM are normal. Pupils are equal, round, and reactive to light.  Neck: Normal range of motion and full passive range of motion without pain. Neck supple.  Cardiovascular: Normal rate, regular rhythm and normal heart sounds.  Exam reveals no gallop and no friction rub.   No murmur heard. Pulmonary/Chest: Effort normal and breath sounds normal. No respiratory distress. She has no wheezes. She has no rhonchi. She has no rales. She exhibits no tenderness and no crepitus.  Abdominal: Soft. Normal appearance and bowel  sounds are normal. She exhibits no distension. There is no tenderness. There is no rebound and no guarding.  Musculoskeletal: Normal range of motion. She exhibits no edema or tenderness.  Moves all extremities well.   Neurological: She is alert and oriented to person, place, and time. She has normal strength. No cranial nerve deficit.  Skin: Skin is warm, dry and intact. Rash noted. No erythema. No pallor.  Some round, red areas mainly to upper chest, upper arms They coalesce together in areas No pustules, no open lesions  Psychiatric: She has a normal mood and affect. Her speech is normal and behavior is normal. Her mood appears not anxious.  Nursing note and vitals reviewed.   ED Course  Procedures (including critical care time) Medications  sodium chloride 0.9 % bolus 1,000 mL (0 mLs Intravenous Stopped 02/17/14 0120)  metoCLOPramide (REGLAN) injection 10 mg (10 mg Intravenous Given 02/17/14 0044)  diphenhydrAMINE (BENADRYL) injection 25 mg (25 mg Intravenous Given 02/17/14 0044)     DIAGNOSTIC STUDIES: Oxygen Saturation is 98% on room air, normal by my interpretation.    COORDINATION OF CARE: 12:27 AM Discussed treatment plan with patient at beside, the patient agrees with the plan and has no further questions at this time.  Patient states her headache is almost gone at time of discharge. She does not want any further medications.  Labs Review Results for orders placed or performed during the hospital encounter of 02/16/14  POC CBG, ED  Result Value Ref Range   Glucose-Capillary 99 70 - 99 mg/dL   Laboratory interpretation all normal    Imaging Review No results found.   EKG Interpretation None      MDM   Final diagnoses:  Headache, unspecified headache type  Pruritic erythematous rash    New Prescriptions   No medications on file  OTC pepcid and zyrtec  Rolland Porter, MD, FACEP   I personally performed the services described in this documentation, which was  scribed in my presence. The recorded information has been reviewed and considered.  Rolland Porter, MD, [facility]   Janice Norrie, MD 02/17/14 [id number]

## 2014-02-16 NOTE — ED Notes (Signed)
Headache for 2  Days, nausea, rash on chest

## 2014-02-17 DIAGNOSIS — J449 Chronic obstructive pulmonary disease, unspecified: Secondary | ICD-10-CM | POA: Diagnosis not present

## 2014-02-17 DIAGNOSIS — F419 Anxiety disorder, unspecified: Secondary | ICD-10-CM | POA: Diagnosis not present

## 2014-02-17 DIAGNOSIS — G43909 Migraine, unspecified, not intractable, without status migrainosus: Secondary | ICD-10-CM | POA: Diagnosis not present

## 2014-02-17 DIAGNOSIS — G8929 Other chronic pain: Secondary | ICD-10-CM | POA: Diagnosis not present

## 2014-02-17 DIAGNOSIS — L299 Pruritus, unspecified: Secondary | ICD-10-CM | POA: Diagnosis not present

## 2014-02-17 DIAGNOSIS — E119 Type 2 diabetes mellitus without complications: Secondary | ICD-10-CM | POA: Diagnosis not present

## 2014-02-17 DIAGNOSIS — M1990 Unspecified osteoarthritis, unspecified site: Secondary | ICD-10-CM | POA: Diagnosis not present

## 2014-02-17 DIAGNOSIS — I10 Essential (primary) hypertension: Secondary | ICD-10-CM | POA: Diagnosis not present

## 2014-02-17 DIAGNOSIS — K219 Gastro-esophageal reflux disease without esophagitis: Secondary | ICD-10-CM | POA: Diagnosis not present

## 2014-02-17 LAB — CBG MONITORING, ED: Glucose-Capillary: 99 mg/dL (ref 70–99)

## 2014-02-17 MED ORDER — METOCLOPRAMIDE HCL 5 MG/ML IJ SOLN
10.0000 mg | Freq: Once | INTRAMUSCULAR | Status: AC
  Administered 2014-02-17: 10 mg via INTRAVENOUS
  Filled 2014-02-17: qty 2

## 2014-02-17 MED ORDER — SODIUM CHLORIDE 0.9 % IV BOLUS (SEPSIS)
1000.0000 mL | Freq: Once | INTRAVENOUS | Status: AC
  Administered 2014-02-17: 1000 mL via INTRAVENOUS

## 2014-02-17 MED ORDER — DIPHENHYDRAMINE HCL 50 MG/ML IJ SOLN
25.0000 mg | Freq: Once | INTRAMUSCULAR | Status: AC
  Administered 2014-02-17: 25 mg via INTRAVENOUS
  Filled 2014-02-17: qty 1

## 2014-02-17 NOTE — ED Notes (Signed)
MD at bedside. 

## 2014-02-17 NOTE — Discharge Instructions (Signed)
Go home and rest. Recheck as needed. You can take pepcid 20 mg OTC twice a day for your itchy rash. Take zyrtec OTC once daily for your itchy rash.  Keep your appointment with the dermatologist on Monday, Feb 8th.

## 2014-02-20 DIAGNOSIS — L309 Dermatitis, unspecified: Secondary | ICD-10-CM | POA: Diagnosis not present

## 2014-02-24 DIAGNOSIS — M47812 Spondylosis without myelopathy or radiculopathy, cervical region: Secondary | ICD-10-CM | POA: Diagnosis not present

## 2014-02-24 DIAGNOSIS — M47816 Spondylosis without myelopathy or radiculopathy, lumbar region: Secondary | ICD-10-CM | POA: Diagnosis not present

## 2014-02-24 DIAGNOSIS — M791 Myalgia: Secondary | ICD-10-CM | POA: Diagnosis not present

## 2014-02-24 DIAGNOSIS — M5136 Other intervertebral disc degeneration, lumbar region: Secondary | ICD-10-CM | POA: Diagnosis not present

## 2014-03-02 DIAGNOSIS — E119 Type 2 diabetes mellitus without complications: Secondary | ICD-10-CM | POA: Diagnosis not present

## 2014-03-02 DIAGNOSIS — J019 Acute sinusitis, unspecified: Secondary | ICD-10-CM | POA: Diagnosis not present

## 2014-03-02 DIAGNOSIS — N760 Acute vaginitis: Secondary | ICD-10-CM | POA: Diagnosis not present

## 2014-03-02 DIAGNOSIS — Z6825 Body mass index (BMI) 25.0-25.9, adult: Secondary | ICD-10-CM | POA: Diagnosis not present

## 2014-03-02 DIAGNOSIS — E538 Deficiency of other specified B group vitamins: Secondary | ICD-10-CM | POA: Diagnosis not present

## 2014-03-03 ENCOUNTER — Other Ambulatory Visit (HOSPITAL_COMMUNITY): Payer: Self-pay | Admitting: Internal Medicine

## 2014-03-03 DIAGNOSIS — K7689 Other specified diseases of liver: Secondary | ICD-10-CM

## 2014-03-03 DIAGNOSIS — R740 Nonspecific elevation of levels of transaminase and lactic acid dehydrogenase [LDH]: Principal | ICD-10-CM

## 2014-03-03 DIAGNOSIS — R7401 Elevation of levels of liver transaminase levels: Secondary | ICD-10-CM

## 2014-03-06 DIAGNOSIS — E119 Type 2 diabetes mellitus without complications: Secondary | ICD-10-CM | POA: Diagnosis not present

## 2014-03-07 ENCOUNTER — Ambulatory Visit (HOSPITAL_COMMUNITY): Admission: RE | Admit: 2014-03-07 | Payer: Medicare HMO | Source: Ambulatory Visit

## 2014-03-07 DIAGNOSIS — M5136 Other intervertebral disc degeneration, lumbar region: Secondary | ICD-10-CM | POA: Diagnosis not present

## 2014-03-10 ENCOUNTER — Ambulatory Visit (HOSPITAL_COMMUNITY)
Admission: RE | Admit: 2014-03-10 | Discharge: 2014-03-10 | Disposition: A | Discharge status: Home / Self Care | Payer: Commercial Managed Care - HMO | Source: Ambulatory Visit | Attending: Internal Medicine | Admitting: Internal Medicine

## 2014-03-10 DIAGNOSIS — R7401 Elevation of levels of liver transaminase levels: Secondary | ICD-10-CM

## 2014-03-10 DIAGNOSIS — Z9049 Acquired absence of other specified parts of digestive tract: Secondary | ICD-10-CM | POA: Insufficient documentation

## 2014-03-10 DIAGNOSIS — K7689 Other specified diseases of liver: Secondary | ICD-10-CM | POA: Diagnosis not present

## 2014-03-10 DIAGNOSIS — R740 Nonspecific elevation of levels of transaminase and lactic acid dehydrogenase [LDH]: Secondary | ICD-10-CM | POA: Diagnosis not present

## 2014-03-10 DIAGNOSIS — Z9889 Other specified postprocedural states: Secondary | ICD-10-CM | POA: Diagnosis not present

## 2014-03-10 DIAGNOSIS — R945 Abnormal results of liver function studies: Secondary | ICD-10-CM | POA: Insufficient documentation

## 2014-03-14 DIAGNOSIS — M5136 Other intervertebral disc degeneration, lumbar region: Secondary | ICD-10-CM | POA: Diagnosis not present

## 2014-03-14 DIAGNOSIS — M25562 Pain in left knee: Secondary | ICD-10-CM | POA: Diagnosis not present

## 2014-03-14 DIAGNOSIS — M25561 Pain in right knee: Secondary | ICD-10-CM | POA: Diagnosis not present

## 2014-03-14 DIAGNOSIS — M47812 Spondylosis without myelopathy or radiculopathy, cervical region: Secondary | ICD-10-CM | POA: Diagnosis not present

## 2014-03-14 DIAGNOSIS — G8929 Other chronic pain: Secondary | ICD-10-CM | POA: Diagnosis not present

## 2014-03-14 DIAGNOSIS — F1190 Opioid use, unspecified, uncomplicated: Secondary | ICD-10-CM | POA: Diagnosis not present

## 2014-03-14 DIAGNOSIS — E1140 Type 2 diabetes mellitus with diabetic neuropathy, unspecified: Secondary | ICD-10-CM | POA: Diagnosis not present

## 2014-03-14 DIAGNOSIS — M791 Myalgia: Secondary | ICD-10-CM | POA: Diagnosis not present

## 2014-03-14 DIAGNOSIS — M47816 Spondylosis without myelopathy or radiculopathy, lumbar region: Secondary | ICD-10-CM | POA: Diagnosis not present

## 2014-03-15 DIAGNOSIS — Z6825 Body mass index (BMI) 25.0-25.9, adult: Secondary | ICD-10-CM | POA: Diagnosis not present

## 2014-03-15 DIAGNOSIS — N760 Acute vaginitis: Secondary | ICD-10-CM | POA: Diagnosis not present

## 2014-03-15 DIAGNOSIS — J019 Acute sinusitis, unspecified: Secondary | ICD-10-CM | POA: Diagnosis not present

## 2014-03-15 DIAGNOSIS — E538 Deficiency of other specified B group vitamins: Secondary | ICD-10-CM | POA: Diagnosis not present

## 2014-03-15 DIAGNOSIS — E119 Type 2 diabetes mellitus without complications: Secondary | ICD-10-CM | POA: Diagnosis not present

## 2014-03-17 ENCOUNTER — Encounter: Payer: Self-pay | Admitting: Internal Medicine

## 2014-03-27 ENCOUNTER — Emergency Department (HOSPITAL_COMMUNITY): Payer: Commercial Managed Care - HMO

## 2014-03-27 ENCOUNTER — Encounter (HOSPITAL_COMMUNITY): Payer: Self-pay

## 2014-03-27 ENCOUNTER — Emergency Department (HOSPITAL_COMMUNITY)
Admission: EM | Admit: 2014-03-27 | Discharge: 2014-03-27 | Disposition: A | Discharge status: Home / Self Care | Payer: Commercial Managed Care - HMO | Attending: Emergency Medicine | Admitting: Emergency Medicine

## 2014-03-27 DIAGNOSIS — R1110 Vomiting, unspecified: Secondary | ICD-10-CM | POA: Diagnosis not present

## 2014-03-27 DIAGNOSIS — R519 Headache, unspecified: Secondary | ICD-10-CM

## 2014-03-27 DIAGNOSIS — R531 Weakness: Secondary | ICD-10-CM | POA: Diagnosis not present

## 2014-03-27 DIAGNOSIS — Z7982 Long term (current) use of aspirin: Secondary | ICD-10-CM | POA: Diagnosis not present

## 2014-03-27 DIAGNOSIS — E119 Type 2 diabetes mellitus without complications: Secondary | ICD-10-CM | POA: Insufficient documentation

## 2014-03-27 DIAGNOSIS — K219 Gastro-esophageal reflux disease without esophagitis: Secondary | ICD-10-CM | POA: Insufficient documentation

## 2014-03-27 DIAGNOSIS — Z792 Long term (current) use of antibiotics: Secondary | ICD-10-CM | POA: Insufficient documentation

## 2014-03-27 DIAGNOSIS — Z9889 Other specified postprocedural states: Secondary | ICD-10-CM | POA: Insufficient documentation

## 2014-03-27 DIAGNOSIS — Z880 Allergy status to penicillin: Secondary | ICD-10-CM | POA: Diagnosis not present

## 2014-03-27 DIAGNOSIS — Z79899 Other long term (current) drug therapy: Secondary | ICD-10-CM | POA: Diagnosis not present

## 2014-03-27 DIAGNOSIS — R110 Nausea: Secondary | ICD-10-CM | POA: Diagnosis not present

## 2014-03-27 DIAGNOSIS — R51 Headache: Secondary | ICD-10-CM | POA: Diagnosis not present

## 2014-03-27 DIAGNOSIS — J449 Chronic obstructive pulmonary disease, unspecified: Secondary | ICD-10-CM | POA: Insufficient documentation

## 2014-03-27 DIAGNOSIS — Z86010 Personal history of colonic polyps: Secondary | ICD-10-CM | POA: Diagnosis not present

## 2014-03-27 DIAGNOSIS — R1030 Lower abdominal pain, unspecified: Secondary | ICD-10-CM | POA: Diagnosis not present

## 2014-03-27 DIAGNOSIS — I10 Essential (primary) hypertension: Secondary | ICD-10-CM | POA: Insufficient documentation

## 2014-03-27 DIAGNOSIS — G8929 Other chronic pain: Secondary | ICD-10-CM | POA: Diagnosis not present

## 2014-03-27 DIAGNOSIS — R42 Dizziness and giddiness: Secondary | ICD-10-CM | POA: Diagnosis not present

## 2014-03-27 LAB — URINALYSIS, ROUTINE W REFLEX MICROSCOPIC
BILIRUBIN URINE: NEGATIVE
Glucose, UA: NEGATIVE mg/dL
HGB URINE DIPSTICK: NEGATIVE
Ketones, ur: NEGATIVE mg/dL
Leukocytes, UA: NEGATIVE
Nitrite: NEGATIVE
PROTEIN: NEGATIVE mg/dL
Specific Gravity, Urine: 1.01 (ref 1.005–1.030)
Urobilinogen, UA: 0.2 mg/dL (ref 0.0–1.0)
pH: 5.5 (ref 5.0–8.0)

## 2014-03-27 LAB — BASIC METABOLIC PANEL
ANION GAP: 4 — AB (ref 5–15)
BUN: 12 mg/dL (ref 6–23)
CHLORIDE: 105 mmol/L (ref 96–112)
CO2: 30 mmol/L (ref 19–32)
Calcium: 9.4 mg/dL (ref 8.4–10.5)
Creatinine, Ser: 0.88 mg/dL (ref 0.50–1.10)
GFR calc non Af Amer: 74 mL/min — ABNORMAL LOW (ref >90)
GFR, EST AFRICAN AMERICAN: 85 mL/min — AB (ref >90)
Glucose, Bld: 55 mg/dL — ABNORMAL LOW (ref 70–99)
POTASSIUM: 3.5 mmol/L (ref 3.5–5.1)
Sodium: 139 mmol/L (ref 135–145)

## 2014-03-27 LAB — LIPASE, BLOOD: LIPASE: 26 U/L (ref 11–59)

## 2014-03-27 LAB — CBC WITH DIFFERENTIAL/PLATELET
BASOS PCT: 1 % (ref 0–1)
Basophils Absolute: 0 10*3/uL (ref 0.0–0.1)
EOS ABS: 0.1 10*3/uL (ref 0.0–0.7)
Eosinophils Relative: 2 % (ref 0–5)
HEMATOCRIT: 44.3 % (ref 36.0–46.0)
Hemoglobin: 14.2 g/dL (ref 12.0–15.0)
LYMPHS ABS: 2.3 10*3/uL (ref 0.7–4.0)
Lymphocytes Relative: 35 % (ref 12–46)
MCH: 30.3 pg (ref 26.0–34.0)
MCHC: 32.1 g/dL (ref 30.0–36.0)
MCV: 94.7 fL (ref 78.0–100.0)
MONOS PCT: 8 % (ref 3–12)
Monocytes Absolute: 0.5 10*3/uL (ref 0.1–1.0)
Neutro Abs: 3.6 10*3/uL (ref 1.7–7.7)
Neutrophils Relative %: 54 % (ref 43–77)
Platelets: 244 10*3/uL (ref 150–400)
RBC: 4.68 MIL/uL (ref 3.87–5.11)
RDW: 13.9 % (ref 11.5–15.5)
WBC: 6.5 10*3/uL (ref 4.0–10.5)

## 2014-03-27 LAB — TROPONIN I: Troponin I: <0.03 ng/mL (ref <0.031)

## 2014-03-27 LAB — CBG MONITORING, ED
GLUCOSE-CAPILLARY: 117 mg/dL — AB (ref 70–99)
Glucose-Capillary: 105 mg/dL — ABNORMAL HIGH (ref 70–99)

## 2014-03-27 MED ORDER — METOCLOPRAMIDE HCL 5 MG/ML IJ SOLN
10.0000 mg | Freq: Once | INTRAMUSCULAR | Status: AC
  Administered 2014-03-27: 10 mg via INTRAVENOUS
  Filled 2014-03-27: qty 2

## 2014-03-27 MED ORDER — KETOROLAC TROMETHAMINE 30 MG/ML IJ SOLN
30.0000 mg | Freq: Once | INTRAMUSCULAR | Status: AC
  Administered 2014-03-27: 30 mg via INTRAVENOUS
  Filled 2014-03-27: qty 1

## 2014-03-27 MED ORDER — SODIUM CHLORIDE 0.9 % IV BOLUS (SEPSIS)
1000.0000 mL | Freq: Once | INTRAVENOUS | Status: AC
  Administered 2014-03-27: 1000 mL via INTRAVENOUS

## 2014-03-27 MED ORDER — DIPHENHYDRAMINE HCL 50 MG/ML IJ SOLN
25.0000 mg | Freq: Once | INTRAMUSCULAR | Status: AC
  Administered 2014-03-27: 25 mg via INTRAVENOUS
  Filled 2014-03-27: qty 1

## 2014-03-27 MED ORDER — DEXAMETHASONE SODIUM PHOSPHATE 10 MG/ML IJ SOLN
10.0000 mg | Freq: Once | INTRAMUSCULAR | Status: AC
  Administered 2014-03-27: 10 mg via INTRAVENOUS
  Filled 2014-03-27: qty 1

## 2014-03-27 NOTE — Discharge Instructions (Signed)
Continue your medications as before, and follow-up with your primary Dr. if not improving or if your symptoms worsen.   Fatigue Fatigue is a feeling of tiredness, lack of energy, lack of motivation, or feeling tired all the time. Having enough rest, good nutrition, and reducing stress will normally reduce fatigue. Consult your caregiver if it persists. The nature of your fatigue will help your caregiver to find out its cause. The treatment is based on the cause.  CAUSES  There are many causes for fatigue. Most of the time, fatigue can be traced to one or more of your habits or routines. Most causes fit into one or more of three general areas. They are: Lifestyle problems  Sleep disturbances.  Overwork.  Physical exertion.  Unhealthy habits.  Poor eating habits or eating disorders.  Alcohol and/or drug use .  Lack of proper nutrition (malnutrition). Psychological problems  Stress and/or anxiety problems.  Depression.  Grief.  Boredom. Medical Problems or Conditions  Anemia.  Pregnancy.  Thyroid gland problems.  Recovery from major surgery.  Continuous pain.  Emphysema or asthma that is not well controlled  Allergic conditions.  Diabetes.  Infections (such as mononucleosis).  Obesity.  Sleep disorders, such as sleep apnea.  Heart failure or other heart-related problems.  Cancer.  Kidney disease.  Liver disease.  Effects of certain medicines such as antihistamines, cough and cold remedies, prescription pain medicines, heart and blood pressure medicines, drugs used for treatment of cancer, and some antidepressants. SYMPTOMS  The symptoms of fatigue include:   Lack of energy.  Lack of drive (motivation).  Drowsiness.  Feeling of indifference to the surroundings. DIAGNOSIS  The details of how you feel help guide your caregiver in finding out what is causing the fatigue. You will be asked about your present and past health condition. It is important  to review all medicines that you take, including prescription and non-prescription items. A thorough exam will be done. You will be questioned about your feelings, habits, and normal lifestyle. Your caregiver may suggest blood tests, urine tests, or other tests to look for common medical causes of fatigue.  TREATMENT  Fatigue is treated by correcting the underlying cause. For example, if you have continuous pain or depression, treating these causes will improve how you feel. Similarly, adjusting the dose of certain medicines will help in reducing fatigue.  HOME CARE INSTRUCTIONS   Try to get the required amount of good sleep every night.  Eat a healthy and nutritious diet, and drink enough water throughout the day.  Practice ways of relaxing (including yoga or meditation).  Exercise regularly.  Make plans to change situations that cause stress. Act on those plans so that stresses decrease over time. Keep your work and personal routine reasonable.  Avoid street drugs and minimize use of alcohol.  Start taking a daily multivitamin after consulting your caregiver. SEEK MEDICAL CARE IF:   You have persistent tiredness, which cannot be accounted for.  You have fever.  You have unintentional weight loss.  You have headaches.  You have disturbed sleep throughout the night.  You are feeling sad.  You have constipation.  You have dry skin.  You have gained weight.  You are taking any new or different medicines that you suspect are causing fatigue.  You are unable to sleep at night.  You develop any unusual swelling of your legs or other parts of your body. SEEK IMMEDIATE MEDICAL CARE IF:   You are feeling confused.  Your vision  is blurred.  You feel faint or pass out.  You develop severe headache.  You develop severe abdominal, pelvic, or back pain.  You develop chest pain, shortness of breath, or an irregular or fast heartbeat.  You are unable to pass a normal amount  of urine.  You develop abnormal bleeding such as bleeding from the rectum or you vomit blood.  You have thoughts about harming yourself or committing suicide.  You are worried that you might harm someone else. MAKE SURE YOU:   Understand these instructions.  Will watch your condition.  Will get help right away if you are not doing well or get worse. Document Released: 10/27/2006 Document Revised: 03/24/2011 Document Reviewed: 05/03/2013 [facility] Patient Information 2015 [place], [place]. This information is not intended to replace advice given to you by your health care provider. Make sure you discuss any questions you have with your health care provider.  Migraine Headache A migraine headache is an intense, throbbing pain on one or both sides of your head. A migraine can last for 30 minutes to several hours. CAUSES  The exact cause of a migraine headache is not always known. However, a migraine may be caused when nerves in the brain become irritated and release chemicals that cause inflammation. This causes pain. Certain things may also trigger migraines, such as:  Alcohol.  Smoking.  Stress.  Menstruation.  Aged cheeses.  Foods or drinks that contain nitrates, glutamate, aspartame, or tyramine.  Lack of sleep.  Chocolate.  Caffeine.  Hunger.  Physical exertion.  Fatigue.  Medicines used to treat chest pain (nitroglycerine), birth control pills, estrogen, and some blood pressure medicines. SIGNS AND SYMPTOMS  Pain on one or both sides of your head.  Pulsating or throbbing pain.  Severe pain that prevents daily activities.  Pain that is aggravated by any physical activity.  Nausea, vomiting, or both.  Dizziness.  Pain with exposure to bright lights, loud noises, or activity.  General sensitivity to bright lights, loud noises, or smells. Before you get a migraine, you may get warning signs that a migraine is coming (aura). An aura may include:  Seeing  flashing lights.  Seeing bright spots, halos, or zigzag lines.  Having tunnel vision or blurred vision.  Having feelings of numbness or tingling.  Having trouble talking.  Having muscle weakness. DIAGNOSIS  A migraine headache is often diagnosed based on:  Symptoms.  Physical exam.  A CT scan or MRI of your head. These imaging tests cannot diagnose migraines, but they can help rule out other causes of headaches. TREATMENT Medicines may be given for pain and nausea. Medicines can also be given to help prevent recurrent migraines.  HOME CARE INSTRUCTIONS  Only take over-the-counter or prescription medicines for pain or discomfort as directed by your health care provider. The use of long-term narcotics is not recommended.  Lie down in a dark, quiet room when you have a migraine.  Keep a journal to find out what may trigger your migraine headaches. For example, write down:  What you eat and drink.  How much sleep you get.  Any change to your diet or medicines.  Limit alcohol consumption.  Quit smoking if you smoke.  Get 7-9 hours of sleep, or as recommended by your health care provider.  Limit stress.  Keep lights dim if bright lights bother you and make your migraines worse. SEEK IMMEDIATE MEDICAL CARE IF:   Your migraine becomes severe.  You have a fever.  You have a stiff  neck.  You have vision loss.  You have muscular weakness or loss of muscle control.  You start losing your balance or have trouble walking.  You feel faint or pass out.  You have severe symptoms that are different from your first symptoms. MAKE SURE YOU:   Understand these instructions.  Will watch your condition.  Will get help right away if you are not doing well or get worse. Document Released: 12/30/2004 Document Revised: 05/16/2013 Document Reviewed: 09/06/2012 [facility] Patient Information 2015 [place], [place]. This information is not intended to replace advice given to you by  your health care provider. Make sure you discuss any questions you have with your health care provider.

## 2014-03-27 NOTE — ED Notes (Signed)
Pt c/o pain in LUQ and  lower abd x 1 week.  Also reports dizziness and nausea.  Denies any vomiting, pain with urination, diarrhea, or vaginal bleeding or discharge.  Pt says pain is constant and doesn't change with movement.

## 2014-03-27 NOTE — ED Provider Notes (Signed)
CSN: [id number]     Arrival date & time 03/27/14  1239 History  This chart was scribed for Veryl Speak, MD by Edison Simon, ED Scribe. This patient was seen in room APA18/APA18 and the patient's care was started at 1:41 PM.    Chief Complaint  Patient presents with  . Abdominal Pain  . Dizziness   Patient is a 54 y.o. female presenting with abdominal pain and dizziness. The history is provided by the patient. No language interpreter was used.  Abdominal Pain Pain location: lower abdomen. Pain radiation: left chest. Pain severity:  Moderate Onset quality:  Sudden Duration:  1 week Timing:  Constant Progression:  Unchanged Chronicity:  New Context: not retching and not sick contacts   Relieved by:  None tried Worsened by:  Nothing tried Ineffective treatments:  Position changes Associated symptoms: nausea   Associated symptoms: no constipation, no diarrhea, no fever and no vomiting   Dizziness Quality:  Lightheadedness Severity:  Moderate Onset quality:  Sudden Duration:  1 week Timing:  Intermittent Progression:  Unchanged Chronicity:  New Context: standing up   Relieved by:  None tried Worsened by:  Nothing Ineffective treatments:  None tried Associated symptoms: nausea   Associated symptoms: no diarrhea and no vomiting   Risk factors: no heart disease     HPI Comments: Tanya Velazquez is a 54 y.o. female who presents to the Emergency Department complaining of lower abdominal pain with onset 1 week ago. She reports associated left chest pain, nausea, and dizziness characterized as like lightheadedness. She states she has not had similar dizziness before. She reports associated intermittent headache with onset 1 month ago; she has history of migraines but states this feels unlike usual migraines. She reports history of COPD, anxiety, HTN, HLD, bulging disc, and arthritis. She use medication for HTN and denies any recent changes. She lives by herself and denies sick contacts.  She states she smokes 2ppd. She denies any cardiac history. She denies vomiting, diarrhea, constipation, or fever.  Past Medical History  Diagnosis Date  . Diabetes mellitus   . Migraines   . Osteoarthritis   . GERD (gastroesophageal reflux disease) 04/13/07    EGD Dr Rourk->patulous EG junction, HH, antral erosions  . Anxiety   . Hyperplastic colon polyp 06/27/2010  . H/O hiatal hernia   . SUI (stress urinary incontinence, female)   . COPD (chronic obstructive pulmonary disease)   . DJD (degenerative joint disease)   . Chronic low back pain   . B12 deficiency anemia   . Chronic constipation   . Hypertension   . Smokers' cough   . Raspy voice    Past Surgical History  Procedure Laterality Date  . Colonoscopy  3/31/9    Dr Rourk->friable anal canal  . Colonoscopy  0 06/27/10    Dr. Gala Romney hyperplastic polyp, prominent external hemorrhoid plexus  . Esophagogastroduodenoscopy  06/27/10    Hiatal hernia, GERD,  . Shoulder arthroscopy  08-13-2010    LEFT SHOULDER IMPINGEMENT SYNDROME/ DJD OF AC JOINT  . Right shoulder arthroscopy/ distal clavicle resection / debridement rotator cuff and labrum tear/ bursectomy  06-28-2010    IMPINGEMENT SYNDROME/ AC JOINT ARTHRITIS/ ROTATOR CUFF TENDINOPATHY  . Laparoscopic cholecystectomy  02-23-2007  . Head-up tilt table test  01-21-2007 &  11-04-2004  DR WEINTRAUB    HX CHRONIC RECURRENT SYNCOPAL. EPICODES--  NEGATIVE RESULT INCLUDING ISUPREL INFUSION  . Excision volar ganglion left wrist  06-27-2005  . Hemorrhoid surgery  09-13-2003  . Vaginal  hysterectomy  age 14's  . Laparotomy w/ appendctomy and left salpingo-oophectomy  AGE 47'S  . Bladder suspension  10/07/2011    Procedure: [place] PROCEDURE;  Surgeon: Malka So, MD;  Location: [facility];  Service: Urology;  Laterality: N/A;  1 hour requested for this case  [place]  . Cystoscopy  10/07/2011    Procedure: CYSTOSCOPY;  Surgeon: Malka So, MD;  Location: [facility];  Service: Urology;  Laterality: N/A;  . Stomach surgery    . Cardiac catheterization  08-09-2004 DR Einar Gip    NORMAL CORONARY ARTERIES/  NORMAL LVF  . Appendectomy     Family History  Problem Relation Age of Onset  . Colon cancer Mother 75  . Alzheimer's disease Father   . Hypertension Father    History  Substance Use Topics  . Smoking status: Current Every Day Smoker -- 1.00 packs/day for 35 years    Types: Cigarettes  . Smokeless tobacco: Never Used  . Alcohol Use: No   OB History    No data available     Review of Systems  Constitutional: Negative for fever.  Gastrointestinal: Positive for nausea and abdominal pain. Negative for vomiting, diarrhea and constipation.  Neurological: Positive for dizziness.  A complete 10 system review of systems was obtained and all systems are negative except as noted in the HPI and PMH.    Allergies  Amicinonide-benzyl alcohol; Amoxicillin; Doxycycline; Paroxetine hcl; Penicillins; and Trimethoprim  Home Medications   Prior to Admission medications   Medication Sig Start Date End Date Taking? Authorizing Provider  ALPRAZolam Duanne Moron) 1 MG tablet Take 1 mg by mouth at bedtime as needed for sleep or anxiety.     Historical Provider, MD  amitriptyline (ELAVIL) 100 MG tablet Take 100 mg by mouth at bedtime.    Historical Provider, MD  aspirin EC 81 MG tablet Take 81 mg by mouth daily.    Historical Provider, MD  cephALEXin (KEFLEX) 500 MG capsule Take 1 capsule (500 mg total) by mouth 4 (four) times daily. Patient not taking: Reported on 02/16/2014 12/24/12   Evalee Jefferson, PA-C  citalopram (CELEXA) 10 MG tablet Take 10 mg by mouth every morning.  05/13/10   Historical Provider, MD  citalopram (CELEXA) 40 MG tablet Take 40 mg by mouth daily. 02/02/14   Historical Provider, MD  cyanocobalamin (,VITAMIN B-12,) 1000 MCG/ML injection Inject 1,000 mcg into the muscle every 30 (thirty) days.     Historical Provider, MD  dexlansoprazole  (DEXILANT) 60 MG capsule Take 1 capsule (60 mg total) by mouth 2 (two) times daily before a meal. 08/19/12   Orvil Feil, NP  docusate sodium (COLACE) 100 MG capsule Take 1 capsule (100 mg total) by mouth 2 (two) times daily. 10/07/11   Irine Seal, MD  enalapril (VASOTEC) 2.5 MG tablet Take 2.5 mg by mouth every morning.  06/11/10   Historical Provider, MD  furosemide (LASIX) 40 MG tablet Take 40 mg by mouth every morning.    Historical Provider, MD  ibuprofen (ADVIL,MOTRIN) 200 MG tablet Take 200 mg by mouth every 6 (six) hours as needed for pain.     Historical Provider, MD  LINZESS 290 MCG CAPS capsule TAKE ONE (1) CAPSULE EACH DAY 03/14/13   Orvil Feil, NP  oxycodone (ROXICODONE) 30 MG immediate release tablet Take 30 mg by mouth every 4 (four) hours as needed.  06/11/10   Historical Provider, MD  pravastatin (PRAVACHOL) 20 MG tablet Take  1 tablet by mouth daily. 12/17/12   Historical Provider, MD  PROAIR HFA 108 (90 BASE) MCG/ACT inhaler Inhale 1 puff into the lungs every 4 (four) hours as needed.  05/13/10   Historical Provider, MD  zolpidem (AMBIEN) 10 MG tablet Take 10 mg by mouth at bedtime. 02/02/14   Historical Provider, MD   BP 112/69 mmHg  Pulse 91  Temp(Src) 98.7 F (37.1 C) (Oral)  Resp 20  Ht 5\' 2"  (1.575 m)  Wt 138 lb (62.596 kg)  BMI 25.23 kg/m2  SpO2 100% Physical Exam  Constitutional: She is oriented to person, place, and time. She appears well-developed and well-nourished.  HENT:  Head: Normocephalic and atraumatic.  Right Ear: Tympanic membrane and ear canal normal.  Left Ear: Tympanic membrane and ear canal normal.  Mouth/Throat: Oropharynx is clear and moist. No oropharyngeal exudate.  Eyes: Conjunctivae and EOM are normal. Pupils are equal, round, and reactive to light.  Neck: Normal range of motion. Neck supple.  Cardiovascular: Normal rate, regular rhythm and normal heart sounds.   No murmur heard. Pulmonary/Chest: Effort normal and breath sounds normal. No  respiratory distress. She has no wheezes. She has no rales. She exhibits no tenderness.  Abdominal: Soft. Bowel sounds are normal. She exhibits no distension. There is no tenderness. There is no rebound and no guarding.  Musculoskeletal: Normal range of motion.  Neurological: She is alert and oriented to person, place, and time.  Skin: Skin is warm and dry.  Psychiatric: She has a normal mood and affect.  Nursing note and vitals reviewed.   ED Course  Procedures (including critical care time)  DIAGNOSTIC STUDIES: Oxygen Saturation is 100% on room air, normal by my interpretation.    COORDINATION OF CARE: 1:47 PM Discussed treatment plan with patient at beside, the patient agrees with the plan and has no further questions at this time.   Labs Review Labs Reviewed  CBG MONITORING, ED - Abnormal; Notable for the following:    Glucose-Capillary 105 (*)    All other components within normal limits  CBC WITH DIFFERENTIAL/PLATELET  BASIC METABOLIC PANEL  URINALYSIS, ROUTINE W REFLEX MICROSCOPIC  CBG MONITORING, ED    Imaging Review No results found.   EKG Interpretation   Date/Time:  Monday March 27 2014 12:59:45 EDT Ventricular Rate:  90 PR Interval:  158 QRS Duration: 74 QT Interval:  364 QTC Calculation: 445 R Axis:   1 Text Interpretation:  Normal sinus rhythm Low voltage QRS Possible  Inferior infarct , age undetermined Cannot rule out Anterior infarct , age  undetermined Abnormal ECG Confirmed by Beau Fanny  MD, Tamilyn Lupien ([id number]) on  03/27/2014 4:20:35 PM      MDM   Final diagnoses:  None    Patient is a 54 year old female who presents with multiple complaints. She reports headache, upper abdominal discomfort, lower abdominal discomfort, dizziness, and nausea. This is been going on for the past week. She has had no fevers or chills. She has had no bowel or bladder complaints.  Her physical examination is unremarkable and vital signs are stable. Her workup is  essentially unremarkable including head CT, chest x-ray, laboratory studies with the exception of a glucose of 55. She was given orange juice and the sugar was rechecked and was found to be normal. She was given medications for her headache and is feeling better. She will be discharged with instructions to return if her symptoms worsen or change. I see nothing emergent on the workup and her presentation and  exam raises no red flags that are concerning.  I personally performed the services described in this documentation, which was scribed in my presence. The recorded information has been reviewed and is accurate.      Veryl Speak, MD 03/27/14 [phone]

## 2014-03-27 NOTE — ED Notes (Signed)
Patient given orange juice with sugar per EDP's instruction due to blood sugar of 55. Will recheck patient's blood sugar in approx 30-45 minutes.

## 2014-03-31 ENCOUNTER — Ambulatory Visit: Payer: Medicare HMO | Admitting: Neurology

## 2014-04-03 DIAGNOSIS — E663 Overweight: Secondary | ICD-10-CM | POA: Diagnosis not present

## 2014-04-03 DIAGNOSIS — J449 Chronic obstructive pulmonary disease, unspecified: Secondary | ICD-10-CM | POA: Diagnosis not present

## 2014-04-03 DIAGNOSIS — Z6826 Body mass index (BMI) 26.0-26.9, adult: Secondary | ICD-10-CM | POA: Diagnosis not present

## 2014-04-03 DIAGNOSIS — E538 Deficiency of other specified B group vitamins: Secondary | ICD-10-CM | POA: Diagnosis not present

## 2014-04-03 DIAGNOSIS — J019 Acute sinusitis, unspecified: Secondary | ICD-10-CM | POA: Diagnosis not present

## 2014-04-03 DIAGNOSIS — E1129 Type 2 diabetes mellitus with other diabetic kidney complication: Secondary | ICD-10-CM | POA: Diagnosis not present

## 2014-04-10 ENCOUNTER — Encounter: Payer: Self-pay | Admitting: Gastroenterology

## 2014-04-10 ENCOUNTER — Ambulatory Visit (INDEPENDENT_AMBULATORY_CARE_PROVIDER_SITE_OTHER): Payer: Commercial Managed Care - HMO | Admitting: Gastroenterology

## 2014-04-10 VITALS — BP 110/71 | HR 91 | Temp 97.6°F | Ht 62.0 in | Wt 143.8 lb

## 2014-04-10 DIAGNOSIS — Z8719 Personal history of other diseases of the digestive system: Secondary | ICD-10-CM | POA: Diagnosis not present

## 2014-04-10 DIAGNOSIS — K838 Other specified diseases of biliary tract: Secondary | ICD-10-CM | POA: Diagnosis not present

## 2014-04-10 DIAGNOSIS — R109 Unspecified abdominal pain: Secondary | ICD-10-CM | POA: Diagnosis not present

## 2014-04-10 DIAGNOSIS — R799 Abnormal finding of blood chemistry, unspecified: Secondary | ICD-10-CM | POA: Diagnosis not present

## 2014-04-10 DIAGNOSIS — R7989 Other specified abnormal findings of blood chemistry: Secondary | ICD-10-CM | POA: Diagnosis not present

## 2014-04-10 DIAGNOSIS — R1030 Lower abdominal pain, unspecified: Secondary | ICD-10-CM

## 2014-04-10 DIAGNOSIS — R945 Abnormal results of liver function studies: Secondary | ICD-10-CM | POA: Insufficient documentation

## 2014-04-10 DIAGNOSIS — R1011 Right upper quadrant pain: Secondary | ICD-10-CM | POA: Insufficient documentation

## 2014-04-10 LAB — COMPREHENSIVE METABOLIC PANEL
ALBUMIN: 4.1 g/dL (ref 3.5–5.2)
ALT: 20 U/L (ref 0–35)
AST: 13 U/L (ref 0–37)
Alkaline Phosphatase: 102 U/L (ref 39–117)
BUN: 8 mg/dL (ref 6–23)
CHLORIDE: 102 meq/L (ref 96–112)
CO2: 29 meq/L (ref 19–32)
CREATININE: 0.81 mg/dL (ref 0.50–1.10)
Calcium: 9.1 mg/dL (ref 8.4–10.5)
GLUCOSE: 93 mg/dL (ref 70–99)
Potassium: 3.3 mEq/L — ABNORMAL LOW (ref 3.5–5.3)
Sodium: 139 mEq/L (ref 135–145)
TOTAL PROTEIN: 7 g/dL (ref 6.0–8.3)
Total Bilirubin: 0.4 mg/dL (ref 0.2–1.2)

## 2014-04-10 LAB — CBC WITH DIFFERENTIAL/PLATELET
BASOS ABS: 0 10*3/uL (ref 0.0–0.1)
Basophils Relative: 0 % (ref 0–1)
EOS ABS: 0.1 10*3/uL (ref 0.0–0.7)
Eosinophils Relative: 2 % (ref 0–5)
HEMATOCRIT: 38.1 % (ref 36.0–46.0)
Hemoglobin: 13 g/dL (ref 12.0–15.0)
Lymphocytes Relative: 35 % (ref 12–46)
Lymphs Abs: 1.4 10*3/uL (ref 0.7–4.0)
MCH: 30.6 pg (ref 26.0–34.0)
MCHC: 34.1 g/dL (ref 30.0–36.0)
MCV: 89.6 fL (ref 78.0–100.0)
MPV: 11.3 fL (ref 8.6–12.4)
Monocytes Absolute: 0.4 10*3/uL (ref 0.1–1.0)
Monocytes Relative: 10 % (ref 3–12)
Neutro Abs: 2.1 10*3/uL (ref 1.7–7.7)
Neutrophils Relative %: 53 % (ref 43–77)
Platelets: 218 10*3/uL (ref 150–400)
RBC: 4.25 MIL/uL (ref 3.87–5.11)
RDW: 14.2 % (ref 11.5–15.5)
WBC: 4 10*3/uL (ref 4.0–10.5)

## 2014-04-10 NOTE — Assessment & Plan Note (Signed)
54 year old female with reported abnormal LFTs (records requested), mild intrahepatic and extra hepatic biliary dilation on recent abdominal ultrasound as outlined above who presents for further evaluation. Patient has history of right upper quadrant abdominal pain. Cannot exclude biliary etiology such as retained stone, other obstructive process. I have requested her previous LFTs for review. Repeat labs at this time. We will decide next step of care which may be MRCP. Further recommendations to follow.

## 2014-04-10 NOTE — Patient Instructions (Signed)
1. If your pain worsens or you develop temperature over 101.5 go back to the emergency department. 2. I have requested copy of your latest labs from Dr. Gerarda Fraction. Please have her blood work done as soon as possible so that we can make a decision for which imaging study you will need.

## 2014-04-10 NOTE — Progress Notes (Signed)
Primary Care Physician:  Glo Herring., MD  Primary Gastroenterologist:  Garfield Cornea, MD   Chief Complaint  Patient presents with  . dilated hepatobilary/CBD    HPI:  Tanya Velazquez is a 54 y.o. female here at the request of Dr. Gerarda Fraction for further evaluation of dilated hepatobiliary tree/CBD. Patient previously seen by her practice but not since 2013. She has a history of refractory GERD failing multiple PPIs. Last time we saw her we have referred her to [facility] for second opinion with Dr. Derrill Kay. She had abnormal pH/impedance study/manometry while off acid suppression. We referred her back for opinion about antireflux surgery. She reports she did have antireflux surgery. Helped nighttime symptoms but still on Dexilant 60mg  BID and has some breakthrough heartburn. No dysphagia.    Seen in emergency department a couple weeks ago for lower abdominal pain and dizziness. Labs as outlined below. She had already had an abdominal ultrasound by PCP for abnormal LFTs which showed a CBD of 9 mm with mild dilation of the intrahepatic bile ducts. Gallbladder surgically absent. Liver appeared normal. MRCP recommended.  Lower abdominal pain for one month. Stabbing like pain. Complains of abdominal swelling gets worse over the course of the day. Worse pain with meals. Pain is constant. Linzess still for several years. BM sometimes daily but at least every 2-3 days. No known melena, brbpr. No dysphagia. Doxycyline given for ?diverticulitis by PCP, scared to take due to history of rash. Took one and had another rash so stopped it. Sometimes some dysuria and retention going on for "a long time". U/A looked good two weeks ago. Denies weight loss. No etoh use. No BCs/Goody's, NSAIDS. Take daily ASA.  Current Outpatient Prescriptions  Medication Sig Dispense Refill  . ALPRAZolam (XANAX) 1 MG tablet Take 1 mg by mouth 4 (four) times daily as needed for sleep or anxiety.     Marland Kitchen amitriptyline (ELAVIL) 100 MG tablet  Take 100 mg by mouth at bedtime.    Marland Kitchen aspirin EC 81 MG tablet Take 81 mg by mouth every other day.     . cholecalciferol (VITAMIN D) 1000 UNITS tablet Take 1,000 Units by mouth daily.    . citalopram (CELEXA) 40 MG tablet Take 40 mg by mouth daily.    . cyanocobalamin (,VITAMIN B-12,) 1000 MCG/ML injection Inject 1,000 mcg into the muscle every 30 (thirty) days.     Marland Kitchen dexlansoprazole (DEXILANT) 60 MG capsule Take 1 capsule (60 mg total) by mouth 2 (two) times daily before a meal. (Patient taking differently: Take 60 mg by mouth daily. ) 60 capsule 3  . docusate sodium (COLACE) 100 MG capsule Take 1 capsule (100 mg total) by mouth 2 (two) times daily. (Patient taking differently: Take 100 mg by mouth daily. ) 60 capsule 2  . enalapril (VASOTEC) 2.5 MG tablet Take 2.5 mg by mouth every morning.     . furosemide (LASIX) 40 MG tablet Take 40 mg by mouth every morning.    Marland Kitchen ibuprofen (ADVIL,MOTRIN) 200 MG tablet Take 400 mg by mouth every 6 (six) hours as needed for pain.     Marland Kitchen LINZESS 290 MCG CAPS capsule TAKE ONE (1) CAPSULE EACH DAY 30 capsule 5  . oxycodone (ROXICODONE) 30 MG immediate release tablet Take 30-60 mg by mouth every 4 (four) hours as needed for pain.     . pravastatin (PRAVACHOL) 20 MG tablet Take 1 tablet by mouth daily.    Marland Kitchen PROAIR HFA 108 (90 BASE) MCG/ACT inhaler Inhale 1  puff into the lungs every 4 (four) hours as needed.     . zolpidem (AMBIEN) 10 MG tablet Take 10 mg by mouth at bedtime as needed for sleep.      No current facility-administered medications for this visit.    Allergies as of 04/10/2014 - Review Complete 04/10/2014  Allergen Reaction Noted  . Amicinonide-benzyl alcohol [cyclocort] Rash 06/20/2010  . Amoxicillin Rash 10/01/2011  . Doxycycline Rash 06/20/2010  . Paroxetine hcl Rash 04/08/2010  . Penicillins Rash 03/10/2011  . Trimethoprim Rash 06/20/2010    Past Medical History  Diagnosis Date  . Diabetes mellitus   . Migraines   . Osteoarthritis   .  GERD (gastroesophageal reflux disease) 04/13/07    EGD Dr Rourk->patulous EG junction, HH, antral erosions  . Anxiety   . Hyperplastic colon polyp 06/27/2010  . H/O hiatal hernia   . SUI (stress urinary incontinence, female)   . COPD (chronic obstructive pulmonary disease)   . DJD (degenerative joint disease)   . Chronic low back pain   . B12 deficiency anemia   . Chronic constipation   . Hypertension   . Smokers' cough   . Raspy voice     Past Surgical History  Procedure Laterality Date  . Colonoscopy  3/31/9    Dr Rourk->friable anal canal  . Colonoscopy  0 06/27/10    Dr. Gala Romney hyperplastic polyp, prominent external hemorrhoid plexus. next tcs 06/2015.  Marland Kitchen Esophagogastroduodenoscopy  06/27/10    Hiatal hernia, GERD,  . Shoulder arthroscopy  08-13-2010    LEFT SHOULDER IMPINGEMENT SYNDROME/ DJD OF AC JOINT  . Right shoulder arthroscopy/ distal clavicle resection / debridement rotator cuff and labrum tear/ bursectomy  06-28-2010    IMPINGEMENT SYNDROME/ AC JOINT ARTHRITIS/ ROTATOR CUFF TENDINOPATHY  . Laparoscopic cholecystectomy  02-23-2007  . Head-up tilt table test  01-21-2007 &  11-04-2004  DR WEINTRAUB    HX CHRONIC RECURRENT SYNCOPAL. EPICODES--  NEGATIVE RESULT INCLUDING ISUPREL INFUSION  . Excision volar ganglion left wrist  06-27-2005  . Hemorrhoid surgery  09-13-2003  . Vaginal hysterectomy  age 60's  . Laparotomy w/ appendctomy and left salpingo-oophectomy  AGE 55'S  . Bladder suspension  10/07/2011    Procedure: [place] PROCEDURE;  Surgeon: Malka So, MD;  Location: [facility];  Service: Urology;  Laterality: N/A;  1 hour requested for this case   . Cystoscopy  10/07/2011    Procedure: CYSTOSCOPY;  Surgeon: Malka So, MD;  Location: [facility];  Service: Urology;  Laterality: N/A;  . Stomach surgery  2013    antireflux surgery at [facility]  . Cardiac catheterization  08-09-2004 DR Einar Gip    NORMAL CORONARY ARTERIES/  NORMAL LVF  .  Appendectomy    . Esophageal manometry  2013    Dr. Derrill Kay at [facility]. incomplete bolus clearance with some breaks in contractions but there was peristalsis  . Ph/impedence  2013    Dr. Derrill Kay at [facility]. Increased reflux off PPI with excellent correlation between reflux events and regurgitation. Patient advised to hold PPI for study by Dr. Derrill Kay.     Family History  Problem Relation Age of Onset  . Colon cancer Mother 73  . Alzheimer's disease Father   . Hypertension Father     History   Social History  . Marital Status: Legally Separated    Spouse Name: N/A  . Number of Children: 3  . Years of Education: N/A   Occupational History  . disabled  Social History Main Topics  . Smoking status: Current Every Day Smoker -- 1.00 packs/day for 35 years    Types: Cigarettes  . Smokeless tobacco: Never Used  . Alcohol Use: No  . Drug Use: No  . Sexual Activity: Yes    Birth Control/ Protection: Surgical   Other Topics Concern  . Not on file   Social History Narrative      ROS:  General: Negative for anorexia, weight loss, fever, chills, fatigue, weakness. Eyes: Negative for vision changes.  ENT: Negative for hoarseness, difficulty swallowing , nasal congestion. CV: Negative for chest pain, angina, palpitations, dyspnea on exertion, peripheral edema.  Respiratory: Negative for dyspnea at rest, dyspnea on exertion, cough, sputum, wheezing.  GI: See history of present illness. GU:  +chronic dysuria and hesitancy. No hematuria, urinary incontinence, urinary frequency, nocturnal urination.  MS: chronic pain diffuse.  Derm: Negative for rash or itching.  Neuro: Negative for weakness, abnormal sensation, seizure, frequent headaches, memory loss, confusion.  Psych: Negative for anxiety, depression, suicidal ideation, hallucinations.  Endo: Negative for unusual weight change.  Heme: Negative for bruising or bleeding. Allergy: Negative for rash or hives.    Physical  Examination:  BP 110/71 mmHg  Pulse 91  Temp(Src) 97.6 F (36.4 C)  Ht 5\' 2"  (1.575 m)  Wt 143 lb 12.8 oz (65.227 kg)  BMI 26.29 kg/m2   General: Well-nourished, well-developed in no acute distress.  Head: Normocephalic, atraumatic.   Eyes: Conjunctiva pink, no icterus. Mouth: Oropharyngeal mucosa moist and pink , no lesions erythema or exudate. Neck: Supple without thyromegaly, masses, or lymphadenopathy.  Lungs: Clear to auscultation bilaterally.  Heart: Regular rate and rhythm, no murmurs rubs or gallops.  Abdomen: Bowel sounds are normal, moderate RUQ tenderness, mild to moderate lower abd tenderness, nondistended, no hepatosplenomegaly or masses, no abdominal bruits or    hernia , no rebound or guarding.   Rectal: not performed Extremities: No lower extremity edema. No clubbing or deformities.  Neuro: Alert and oriented x 4 , grossly normal neurologically.  Skin: Warm and dry, no rash or jaundice.   Psych: Alert and cooperative, normal mood and affect.  Labs: Lab Results  Component Value Date   WBC 6.5 03/27/2014   HGB 14.2 03/27/2014   HCT 44.3 03/27/2014   MCV 94.7 03/27/2014   PLT 244 03/27/2014   Lab Results  Component Value Date   CREATININE 0.88 03/27/2014   BUN 12 03/27/2014   NA 139 03/27/2014   K 3.5 03/27/2014   CL 105 03/27/2014   CO2 30 03/27/2014   Lab Results  Component Value Date   LIPASE 26 03/27/2014      Imaging Studies: Dg Chest 2 View  03/27/2014   CLINICAL DATA:  Dizziness for 1 week.  EXAM: CHEST  2 VIEW  COMPARISON:  August 31, 2012.  FINDINGS: The heart size and mediastinal contours are within normal limits. Both lungs are clear. No pneumothorax or pleural plural effusion is noted. The visualized skeletal structures are unremarkable.  IMPRESSION: No acute cardiopulmonary abnormality seen.   Electronically Signed   By: Marijo Conception, M.D.   On: 03/27/2014 14:41   Ct Head Wo Contrast  03/27/2014   CLINICAL DATA:  Dizziness for 2-3  weeks.  EXAM: CT HEAD WITHOUT CONTRAST  TECHNIQUE: Contiguous axial images were obtained from the base of the skull through the vertex without intravenous contrast.  COMPARISON:  12/24/2012  FINDINGS: The ventricles and sulci are within normal limits for age. There  is no evidence of acute infarct, intracranial hemorrhage, mass, midline shift, or extra-axial collection.  The orbits are unremarkable. The visualized paranasal sinuses and mastoid air cells are clear. There is no evidence of acute skull fracture.  IMPRESSION: Negative head CT.   Electronically Signed   By: Logan Bores   On: 03/27/2014 14:56

## 2014-04-11 ENCOUNTER — Encounter: Payer: Self-pay | Admitting: Gastroenterology

## 2014-04-11 NOTE — Assessment & Plan Note (Signed)
Several week h/o bilateral lower abdominal pain, constant, worse with meals. BMs unaffected. Obtain labs. She may require further imaging. Suspect symptoms unrelated to ruq pain and abd u/s findings. Cannot exclude smoldering diverticulitis. Further recommendations to follow.

## 2014-04-11 NOTE — Assessment & Plan Note (Signed)
S/p antireflux surgery couple of years ago. Still on Dexilant BID. Overall symptoms better controlled, especially night time. Stable at this time.

## 2014-04-11 NOTE — Progress Notes (Signed)
cc'ed to pcp °

## 2014-04-12 DIAGNOSIS — M545 Low back pain: Secondary | ICD-10-CM | POA: Diagnosis not present

## 2014-04-12 DIAGNOSIS — M5136 Other intervertebral disc degeneration, lumbar region: Secondary | ICD-10-CM | POA: Diagnosis not present

## 2014-04-12 DIAGNOSIS — M47812 Spondylosis without myelopathy or radiculopathy, cervical region: Secondary | ICD-10-CM | POA: Diagnosis not present

## 2014-04-12 DIAGNOSIS — M47816 Spondylosis without myelopathy or radiculopathy, lumbar region: Secondary | ICD-10-CM | POA: Diagnosis not present

## 2014-04-12 NOTE — Progress Notes (Signed)
Quick Note:  Reviewed labs from 02/2014 from PCP. WBC 5500, Hgb 13.4, Platelets 270,000, creatinine 0.99, potassium 4.4, calcium 9.1, total bili 0.4, AP 165H, AST 75H, ALT 45H, folate 5.6, HgbA1C 5.9, B12 >2000, TSH 2.190.  LFTs now normal.  Potassium slightly low.   KCL 26meg BID for 3 days. #6 and no RFs. F/u with PCP. Recommend CT A/P with contrast (low abd pain rule out diverticulitis, biliary dilation with elevated LFTs).  She still may need MRCP to evaluate LFTs and biliary dilation but await CT results. ______

## 2014-04-13 ENCOUNTER — Other Ambulatory Visit: Payer: Self-pay | Admitting: Gastroenterology

## 2014-04-13 ENCOUNTER — Other Ambulatory Visit: Payer: Self-pay

## 2014-04-13 ENCOUNTER — Telehealth: Payer: Self-pay | Admitting: Internal Medicine

## 2014-04-13 DIAGNOSIS — R109 Unspecified abdominal pain: Secondary | ICD-10-CM

## 2014-04-13 DIAGNOSIS — R7989 Other specified abnormal findings of blood chemistry: Secondary | ICD-10-CM

## 2014-04-13 DIAGNOSIS — K838 Other specified diseases of biliary tract: Secondary | ICD-10-CM

## 2014-04-13 DIAGNOSIS — R945 Abnormal results of liver function studies: Secondary | ICD-10-CM

## 2014-04-13 MED ORDER — POTASSIUM CHLORIDE CRYS ER 20 MEQ PO TBCR: 20.0000 meq | EXTENDED_RELEASE_TABLET | Freq: Two times a day (BID) | ORAL | Status: DC

## 2014-04-13 NOTE — Telephone Encounter (Signed)
PATIENT CALLED INQUIRING ABOUT LAB RESULTS FROM Monday

## 2014-04-13 NOTE — Telephone Encounter (Signed)
I spoke with the pt about her bloodwork results.

## 2014-04-19 ENCOUNTER — Ambulatory Visit (HOSPITAL_COMMUNITY): Payer: Commercial Managed Care - HMO

## 2014-04-25 ENCOUNTER — Telehealth: Payer: Self-pay | Admitting: Gastroenterology

## 2014-04-25 NOTE — Telephone Encounter (Signed)
Yes

## 2014-04-25 NOTE — Telephone Encounter (Signed)
Called pt and she is aware of new CT scan Appointment. (05/01/2014 @ 2:15pm.)   Pt states her previous date and time she had a death in the family and couldn't go.   States she will go pick up contrast

## 2014-04-25 NOTE — Telephone Encounter (Signed)
Did patient have her CT done?

## 2014-04-25 NOTE — Telephone Encounter (Signed)
Looks like she no showed for the ct. Do you want them to reschedule her?

## 2014-04-25 NOTE — Telephone Encounter (Signed)
Please reschedule pt.

## 2014-05-01 ENCOUNTER — Ambulatory Visit (HOSPITAL_COMMUNITY): Payer: Commercial Managed Care - HMO

## 2014-05-04 DIAGNOSIS — E538 Deficiency of other specified B group vitamins: Secondary | ICD-10-CM | POA: Diagnosis not present

## 2014-05-04 DIAGNOSIS — Z6826 Body mass index (BMI) 26.0-26.9, adult: Secondary | ICD-10-CM | POA: Diagnosis not present

## 2014-05-04 DIAGNOSIS — L309 Dermatitis, unspecified: Secondary | ICD-10-CM | POA: Diagnosis not present

## 2014-05-04 DIAGNOSIS — E663 Overweight: Secondary | ICD-10-CM | POA: Diagnosis not present

## 2014-05-08 ENCOUNTER — Telehealth: Payer: Self-pay | Admitting: *Deleted

## 2014-05-08 NOTE — Telephone Encounter (Signed)
Patient canceled her new patient appointment referring provider Madlyn Frankel) [facility]

## 2014-05-09 ENCOUNTER — Ambulatory Visit: Payer: Medicare HMO | Admitting: Neurology

## 2014-05-16 DIAGNOSIS — E1140 Type 2 diabetes mellitus with diabetic neuropathy, unspecified: Secondary | ICD-10-CM | POA: Diagnosis not present

## 2014-05-16 DIAGNOSIS — M47816 Spondylosis without myelopathy or radiculopathy, lumbar region: Secondary | ICD-10-CM | POA: Diagnosis not present

## 2014-05-16 DIAGNOSIS — M791 Myalgia: Secondary | ICD-10-CM | POA: Diagnosis not present

## 2014-05-16 DIAGNOSIS — G8929 Other chronic pain: Secondary | ICD-10-CM | POA: Diagnosis not present

## 2014-05-16 DIAGNOSIS — M25562 Pain in left knee: Secondary | ICD-10-CM | POA: Diagnosis not present

## 2014-05-16 DIAGNOSIS — F1190 Opioid use, unspecified, uncomplicated: Secondary | ICD-10-CM | POA: Diagnosis not present

## 2014-05-16 DIAGNOSIS — M5136 Other intervertebral disc degeneration, lumbar region: Secondary | ICD-10-CM | POA: Diagnosis not present

## 2014-05-16 DIAGNOSIS — M47812 Spondylosis without myelopathy or radiculopathy, cervical region: Secondary | ICD-10-CM | POA: Diagnosis not present

## 2014-05-16 DIAGNOSIS — M25561 Pain in right knee: Secondary | ICD-10-CM | POA: Diagnosis not present

## 2014-07-01 ENCOUNTER — Encounter (HOSPITAL_COMMUNITY): Payer: Self-pay | Admitting: Emergency Medicine

## 2014-07-01 ENCOUNTER — Emergency Department (HOSPITAL_COMMUNITY)
Admission: EM | Admit: 2014-07-01 | Discharge: 2014-07-01 | Disposition: A | Discharge status: Home / Self Care | Payer: Commercial Managed Care - HMO | Attending: Emergency Medicine | Admitting: Emergency Medicine

## 2014-07-01 DIAGNOSIS — J449 Chronic obstructive pulmonary disease, unspecified: Secondary | ICD-10-CM | POA: Diagnosis not present

## 2014-07-01 DIAGNOSIS — F419 Anxiety disorder, unspecified: Secondary | ICD-10-CM | POA: Insufficient documentation

## 2014-07-01 DIAGNOSIS — K5900 Constipation, unspecified: Secondary | ICD-10-CM | POA: Diagnosis not present

## 2014-07-01 DIAGNOSIS — E119 Type 2 diabetes mellitus without complications: Secondary | ICD-10-CM | POA: Insufficient documentation

## 2014-07-01 DIAGNOSIS — Z79899 Other long term (current) drug therapy: Secondary | ICD-10-CM | POA: Diagnosis not present

## 2014-07-01 DIAGNOSIS — Z720 Tobacco use: Secondary | ICD-10-CM | POA: Diagnosis not present

## 2014-07-01 DIAGNOSIS — M1990 Unspecified osteoarthritis, unspecified site: Secondary | ICD-10-CM | POA: Insufficient documentation

## 2014-07-01 DIAGNOSIS — H53149 Visual discomfort, unspecified: Secondary | ICD-10-CM | POA: Insufficient documentation

## 2014-07-01 DIAGNOSIS — Z86010 Personal history of colonic polyps: Secondary | ICD-10-CM | POA: Insufficient documentation

## 2014-07-01 DIAGNOSIS — J029 Acute pharyngitis, unspecified: Secondary | ICD-10-CM | POA: Insufficient documentation

## 2014-07-01 DIAGNOSIS — I10 Essential (primary) hypertension: Secondary | ICD-10-CM | POA: Insufficient documentation

## 2014-07-01 DIAGNOSIS — R51 Headache: Secondary | ICD-10-CM | POA: Diagnosis not present

## 2014-07-01 DIAGNOSIS — R1110 Vomiting, unspecified: Secondary | ICD-10-CM | POA: Diagnosis not present

## 2014-07-01 DIAGNOSIS — E538 Deficiency of other specified B group vitamins: Secondary | ICD-10-CM | POA: Insufficient documentation

## 2014-07-01 DIAGNOSIS — G43909 Migraine, unspecified, not intractable, without status migrainosus: Secondary | ICD-10-CM | POA: Insufficient documentation

## 2014-07-01 DIAGNOSIS — K219 Gastro-esophageal reflux disease without esophagitis: Secondary | ICD-10-CM | POA: Diagnosis not present

## 2014-07-01 DIAGNOSIS — Z880 Allergy status to penicillin: Secondary | ICD-10-CM | POA: Insufficient documentation

## 2014-07-01 DIAGNOSIS — R519 Headache, unspecified: Secondary | ICD-10-CM

## 2014-07-01 DIAGNOSIS — G8929 Other chronic pain: Secondary | ICD-10-CM | POA: Diagnosis not present

## 2014-07-01 DIAGNOSIS — Z7982 Long term (current) use of aspirin: Secondary | ICD-10-CM | POA: Insufficient documentation

## 2014-07-01 DIAGNOSIS — F17210 Nicotine dependence, cigarettes, uncomplicated: Secondary | ICD-10-CM | POA: Diagnosis not present

## 2014-07-01 LAB — I-STAT CHEM 8, ED
BUN: 7 mg/dL (ref 6–20)
CALCIUM ION: 1.15 mmol/L (ref 1.12–1.23)
Chloride: 100 mmol/L — ABNORMAL LOW (ref 101–111)
Creatinine, Ser: 1 mg/dL (ref 0.44–1.00)
Glucose, Bld: 92 mg/dL (ref 65–99)
HEMATOCRIT: 41 % (ref 36.0–46.0)
Hemoglobin: 13.9 g/dL (ref 12.0–15.0)
Potassium: 3 mmol/L — ABNORMAL LOW (ref 3.5–5.1)
Sodium: 139 mmol/L (ref 135–145)
TCO2: 26 mmol/L (ref 0–100)

## 2014-07-01 LAB — URINALYSIS, ROUTINE W REFLEX MICROSCOPIC
Bilirubin Urine: NEGATIVE
Glucose, UA: NEGATIVE mg/dL
Hgb urine dipstick: NEGATIVE
Ketones, ur: NEGATIVE mg/dL
NITRITE: NEGATIVE
PROTEIN: 30 mg/dL — AB
SPECIFIC GRAVITY, URINE: 1.015 (ref 1.005–1.030)
UROBILINOGEN UA: 2 mg/dL — AB (ref 0.0–1.0)
pH: 6.5 (ref 5.0–8.0)

## 2014-07-01 LAB — TROPONIN I: Troponin I: <0.03 ng/mL (ref <0.031)

## 2014-07-01 LAB — URINE MICROSCOPIC-ADD ON

## 2014-07-01 LAB — RAPID STREP SCREEN (MED CTR MEBANE ONLY): STREPTOCOCCUS, GROUP A SCREEN (DIRECT): NEGATIVE

## 2014-07-01 MED ORDER — DIPHENHYDRAMINE HCL 50 MG/ML IJ SOLN
25.0000 mg | Freq: Once | INTRAMUSCULAR | Status: AC
  Administered 2014-07-01: 25 mg via INTRAVENOUS
  Filled 2014-07-01: qty 1

## 2014-07-01 MED ORDER — HYDROCODONE-ACETAMINOPHEN 5-325 MG PO TABS
1.0000 | ORAL_TABLET | Freq: Once | ORAL | Status: AC
  Administered 2014-07-01: 1 via ORAL
  Filled 2014-07-01: qty 1

## 2014-07-01 MED ORDER — DEXAMETHASONE SODIUM PHOSPHATE 4 MG/ML IJ SOLN
10.0000 mg | Freq: Once | INTRAMUSCULAR | Status: AC
  Administered 2014-07-01: 10 mg via INTRAVENOUS
  Filled 2014-07-01: qty 3

## 2014-07-01 MED ORDER — METOCLOPRAMIDE HCL 5 MG/ML IJ SOLN
10.0000 mg | Freq: Once | INTRAMUSCULAR | Status: AC
  Administered 2014-07-01: 10 mg via INTRAVENOUS
  Filled 2014-07-01: qty 2

## 2014-07-01 MED ORDER — CEPHALEXIN 500 MG PO CAPS: 500.0000 mg | ORAL_CAPSULE | Freq: Four times a day (QID) | ORAL | Status: DC

## 2014-07-01 MED ORDER — KETOROLAC TROMETHAMINE 30 MG/ML IJ SOLN
30.0000 mg | Freq: Once | INTRAMUSCULAR | Status: AC
  Administered 2014-07-01: 30 mg via INTRAVENOUS
  Filled 2014-07-01: qty 1

## 2014-07-01 MED ORDER — MAGIC MOUTHWASH W/LIDOCAINE: 5.0000 mL | Freq: Three times a day (TID) | ORAL | Status: DC | PRN

## 2014-07-01 MED ORDER — TRAMADOL HCL 50 MG PO TABS
50.0000 mg | ORAL_TABLET | Freq: Four times a day (QID) | ORAL | Status: DC | PRN
Start: 2014-07-01 — End: 2017-05-31

## 2014-07-01 MED ORDER — SODIUM CHLORIDE 0.9 % IV BOLUS (SEPSIS)
1000.0000 mL | Freq: Once | INTRAVENOUS | Status: AC
  Administered 2014-07-01: 1000 mL via INTRAVENOUS

## 2014-07-01 NOTE — ED Provider Notes (Signed)
CSN: [id number]     Arrival date & time 07/01/14  1055 History   First MD Initiated Contact with Patient 07/01/14 1120     Chief Complaint  Patient presents with  . Headache     (Consider location/radiation/quality/duration/timing/severity/associated sxs/prior Treatment) HPI   Tanya Velazquez is a 54 y.o. female who presents to the Emergency Department complaining of diffuse frontal headache onset 2 weeks ago.  She describes the pain as dull and throbbing and similar to previous headaches.  She also reports anterior neck pain and sore throat for several days.  She states the pain from her neck radiates into both shoulders.  Episodic vomiting since onset of the headache which she also states is typical for her headaches.  She has tried OTC analgesics without relief.  She denies fever, rash, chills, abd pain, chest pain or shortness of breath, neck stiffness or persistent vomiting.  No known strep exposure  Past Medical History  Diagnosis Date  . Diabetes mellitus   . Migraines   . Osteoarthritis   . GERD (gastroesophageal reflux disease) 04/13/07    EGD Dr Rourk->patulous EG junction, HH, antral erosions  . Anxiety   . Hyperplastic colon polyp 06/27/2010  . H/O hiatal hernia   . SUI (stress urinary incontinence, female)   . COPD (chronic obstructive pulmonary disease)   . DJD (degenerative joint disease)   . Chronic low back pain     lumbar radiculopathy  . B12 deficiency anemia   . Chronic constipation   . Hypertension   . Smokers' cough   . Raspy voice    Past Surgical History  Procedure Laterality Date  . Colonoscopy  3/31/9    Dr Rourk->friable anal canal  . Colonoscopy  0 06/27/10    Dr. Gala Romney hyperplastic polyp, prominent external hemorrhoid plexus. next tcs 06/2015.  Marland Kitchen Esophagogastroduodenoscopy  06/27/10    Hiatal hernia, GERD,  . Shoulder arthroscopy  08-13-2010    LEFT SHOULDER IMPINGEMENT SYNDROME/ DJD OF AC JOINT  . Right shoulder arthroscopy/ distal clavicle  resection / debridement rotator cuff and labrum tear/ bursectomy  06-28-2010    IMPINGEMENT SYNDROME/ AC JOINT ARTHRITIS/ ROTATOR CUFF TENDINOPATHY  . Laparoscopic cholecystectomy  02-23-2007  . Head-up tilt table test  01-21-2007 &  11-04-2004  DR WEINTRAUB    HX CHRONIC RECURRENT SYNCOPAL. EPICODES--  NEGATIVE RESULT INCLUDING ISUPREL INFUSION  . Excision volar ganglion left wrist  06-27-2005  . Hemorrhoid surgery  09-13-2003  . Vaginal hysterectomy  age 61's  . Laparotomy w/ appendctomy and left salpingo-oophectomy  AGE 32'S  . Bladder suspension  10/07/2011    Procedure: [place] PROCEDURE;  Surgeon: Malka So, MD;  Location: [facility];  Service: Urology;  Laterality: N/A;  1 hour requested for this case   . Cystoscopy  10/07/2011    Procedure: CYSTOSCOPY;  Surgeon: Malka So, MD;  Location: [facility];  Service: Urology;  Laterality: N/A;  . Stomach surgery  2013    antireflux surgery at [facility]  . Cardiac catheterization  08-09-2004 DR Einar Gip    NORMAL CORONARY ARTERIES/  NORMAL LVF  . Appendectomy    . Esophageal manometry  2013    Dr. Derrill Kay at [facility]. incomplete bolus clearance with some breaks in contractions but there was peristalsis  . Ph/impedence  2013    Dr. Derrill Kay at [facility]. Increased reflux off PPI with excellent correlation between reflux events and regurgitation. Patient advised to hold PPI for study by Dr. Derrill Kay.  Family History  Problem Relation Age of Onset  . Colon cancer Mother 50  . Alzheimer's disease Father   . Hypertension Father    History  Substance Use Topics  . Smoking status: Current Every Day Smoker -- 1.00 packs/day for 35 years    Types: Cigarettes  . Smokeless tobacco: Never Used  . Alcohol Use: No   OB History    No data available     Review of Systems  Constitutional: Negative for fever, chills, activity change and appetite change.  HENT: Positive for sore throat. Negative for facial swelling and trouble  swallowing.   Eyes: Positive for photophobia. Negative for pain and visual disturbance.  Respiratory: Negative for chest tightness and shortness of breath.   Cardiovascular: Negative for chest pain and leg swelling.  Gastrointestinal: Negative for nausea, vomiting and abdominal pain.  Musculoskeletal: Negative for neck pain and neck stiffness.  Skin: Negative for rash and wound.  Neurological: Positive for headaches. Negative for dizziness, syncope, facial asymmetry, speech difficulty, weakness and numbness.  Psychiatric/Behavioral: Negative for confusion and decreased concentration.  All other systems reviewed and are negative.     Allergies  Sulfa antibiotics; Amicinonide-benzyl alcohol; Amoxicillin; Doxycycline; Paroxetine hcl; Penicillins; and Trimethoprim  Home Medications   Prior to Admission medications   Medication Sig Start Date End Date Taking? Authorizing Provider  ALPRAZolam Duanne Moron) 1 MG tablet Take 1 mg by mouth 4 (four) times daily as needed for sleep or anxiety.    Yes Historical Provider, MD  amitriptyline (ELAVIL) 100 MG tablet Take 100 mg by mouth at bedtime.   Yes Historical Provider, MD  aspirin EC 81 MG tablet Take 81 mg by mouth every other day.    Yes Historical Provider, MD  cholecalciferol (VITAMIN D) 1000 UNITS tablet Take 1,000 Units by mouth daily.   Yes Historical Provider, MD  citalopram (CELEXA) 40 MG tablet Take 40 mg by mouth daily. 02/02/14  Yes Historical Provider, MD  cyanocobalamin (,VITAMIN B-12,) 1000 MCG/ML injection Inject 1,000 mcg into the muscle every 30 (thirty) days.    Yes Historical Provider, MD  dexlansoprazole (DEXILANT) 60 MG capsule Take 1 capsule (60 mg total) by mouth 2 (two) times daily before a meal. Patient taking differently: Take 60 mg by mouth daily.  08/19/12  Yes Orvil Feil, NP  docusate sodium (COLACE) 100 MG capsule Take 1 capsule (100 mg total) by mouth 2 (two) times daily. Patient taking differently: Take 100 mg by mouth  daily.  10/07/11  Yes Irine Seal, MD  enalapril (VASOTEC) 2.5 MG tablet Take 2.5 mg by mouth every morning.  06/11/10  Yes Historical Provider, MD  furosemide (LASIX) 40 MG tablet Take 40 mg by mouth every morning.   Yes Historical Provider, MD  ibuprofen (ADVIL,MOTRIN) 200 MG tablet Take 400 mg by mouth every 6 (six) hours as needed for pain.    Yes Historical Provider, MD  LINZESS 290 MCG CAPS capsule TAKE ONE (1) CAPSULE EACH DAY 03/14/13  Yes Orvil Feil, NP  pravastatin (PRAVACHOL) 20 MG tablet Take 1 tablet by mouth daily. 12/17/12  Yes Historical Provider, MD  PROAIR HFA 108 (90 BASE) MCG/ACT inhaler Inhale 1 puff into the lungs every 4 (four) hours as needed.  05/13/10  Yes Historical Provider, MD  zolpidem (AMBIEN) 10 MG tablet Take 10 mg by mouth at bedtime as needed for sleep.  02/02/14  Yes Historical Provider, MD   BP 106/65 mmHg  Pulse 89  Temp(Src) 98.1 F (36.7 C) (  Oral)  Resp 18  Ht 5\' 2"  (1.575 m)  Wt 130 lb (58.968 kg)  BMI 23.77 kg/m2  SpO2 100% Physical Exam  Constitutional: She is oriented to person, place, and time. She appears well-developed and well-nourished. No distress.  HENT:  Head: Normocephalic and atraumatic.  Right Ear: Tympanic membrane and ear canal normal.  Left Ear: Tympanic membrane and ear canal normal.  Mouth/Throat: Uvula is midline and mucous membranes are normal. Posterior oropharyngeal erythema present. No oropharyngeal exudate, posterior oropharyngeal edema or tonsillar abscesses.  Eyes: EOM are normal. Pupils are equal, round, and reactive to light.  Neck: Normal range of motion and phonation normal. Neck supple. No JVD present. No spinous process tenderness and no muscular tenderness present. No rigidity. No Kernig's sign noted.  shoty bilateral anterior cervical nodes.   Cardiovascular: Normal rate, regular rhythm, normal heart sounds and intact distal pulses.   No murmur heard. Pulmonary/Chest: Effort normal and breath sounds normal. No  respiratory distress.  Musculoskeletal: Normal range of motion.  Neurological: She is alert and oriented to person, place, and time. She has normal strength. No cranial nerve deficit or sensory deficit. She exhibits normal muscle tone. Coordination and gait normal. GCS eye subscore is 4. GCS verbal subscore is 5. GCS motor subscore is 6.  Reflex Scores:      Tricep reflexes are 2+ on the right side and 2+ on the left side.      Bicep reflexes are 2+ on the right side and 2+ on the left side. Skin: Skin is warm and dry.  Psychiatric: She has a normal mood and affect.  Nursing note and vitals reviewed.   ED Course  Procedures (including critical care time) Labs Review Labs Reviewed  URINALYSIS, ROUTINE W REFLEX MICROSCOPIC (NOT AT [facility]) - Abnormal; Notable for the following:    APPearance HAZY (*)    Protein, ur 30 (*)    Urobilinogen, UA 2.0 (*)    Leukocytes, UA SMALL (*)    All other components within normal limits  URINE MICROSCOPIC-ADD ON - Abnormal; Notable for the following:    Squamous Epithelial / LPF MANY (*)    Bacteria, UA MANY (*)    All other components within normal limits  RAPID STREP SCREEN (NOT AT [facility])  TROPONIN I  I-STAT CHEM 8, ED    Imaging Review No results found.   EKG Interpretation   Date/Time:  Saturday July 01 2014 12:02:01 EDT Ventricular Rate:  78 PR Interval:  161 QRS Duration: 93 QT Interval:  390 QTC Calculation: 444 R Axis:   49 Text Interpretation:  Sinus rhythm Low voltage, precordial leads When  compared with ECG of 03/27/2014 No significant change was found Confirmed  by [facility]  MD, Nunzio Cory [phone]) on 07/01/2014 1:44:20 PM      MDM   Final diagnoses:  Intractable headache, unspecified chronicity pattern, unspecified headache type  Acute pharyngitis, unspecified pharyngitis type    Pt with gradual onset of diffuse headache that is similar to previous,  No meningeal signs, non-toxic appearing.  Vitals stable.  Labs and EKG  reviewed and are reassuring.  Pt finally feeling better after medications and IVF's.  Appears stable for d/c.  No concerning sx's for emergent neurological process or SAH.  Pt agrees to f/u with PMD.  Appears stable for d/c     Kem Parkinson, PA-C 07/02/14 [place], DO 07/04/14 [phone]

## 2014-07-01 NOTE — Discharge Instructions (Signed)
Migraine Headache A migraine headache is very bad, throbbing pain on one or both sides of your head. Talk to your doctor about what things may bring on (trigger) your migraine headaches. HOME CARE  Only take medicines as told by your doctor.  Lie down in a dark, quiet room when you have a migraine.  Keep a journal to find out if certain things bring on migraine headaches. For example, write down:  What you eat and drink.  How much sleep you get.  Any change to your diet or medicines.  Lessen how much alcohol you drink.  Quit smoking if you smoke.  Get enough sleep.  Lessen any stress in your life.  Keep lights dim if bright lights bother you or make your migraines worse. GET HELP RIGHT AWAY IF:   Your migraine becomes really bad.  You have a fever.  You have a stiff neck.  You have trouble seeing.  Your muscles are weak, or you lose muscle control.  You lose your balance or have trouble walking.  You feel like you will pass out (faint), or you pass out.  You have really bad symptoms that are different than your first symptoms. MAKE SURE YOU:   Understand these instructions.  Will watch your condition.  Will get help right away if you are not doing well or get worse. Document Released: 10/09/2007 Document Revised: 03/24/2011 Document Reviewed: 09/06/2012 [facility] Patient Information 2015 [place], [place]. This information is not intended to replace advice given to you by your health care provider. Make sure you discuss any questions you have with your health care provider.   Pharyngitis Pharyngitis is a sore throat (pharynx). There is redness, pain, and swelling of your throat. HOME CARE   Drink enough fluids to keep your pee (urine) clear or pale yellow.  Only take medicine as told by your doctor.  You may get sick again if you do not take medicine as told. Finish your medicines, even if you start to feel better.  Do not take aspirin.  Rest.  Rinse  your mouth (gargle) with salt water ( tsp of salt per 1 qt of water) every 1-2 hours. This will help the pain.  If you are not at risk for choking, you can suck on hard candy or sore throat lozenges. GET HELP IF:  You have large, tender lumps on your neck.  You have a rash.  You cough up green, yellow-brown, or bloody spit. GET HELP RIGHT AWAY IF:   You have a stiff neck.  You drool or cannot swallow liquids.  You throw up (vomit) or are not able to keep medicine or liquids down.  You have very bad pain that does not go away with medicine.  You have problems breathing (not from a stuffy nose). MAKE SURE YOU:   Understand these instructions.  Will watch your condition.  Will get help right away if you are not doing well or get worse. Document Released: 06/18/2007 Document Revised: 10/20/2012 Document Reviewed: 09/06/2012 [facility] Patient Information 2015 [place], [place]. This information is not intended to replace advice given to you by your health care provider. Make sure you discuss any questions you have with your health care provider.

## 2014-07-01 NOTE — ED Notes (Signed)
Pt reports headache,lower back pain and neck pain x1 week. Pt reports light and sound sensitivity. nad noted.

## 2014-07-03 LAB — CULTURE, GROUP A STREP: Strep A Culture: POSITIVE — AB

## 2014-07-04 ENCOUNTER — Telehealth (HOSPITAL_BASED_OUTPATIENT_CLINIC_OR_DEPARTMENT_OTHER): Payer: Self-pay | Admitting: Emergency Medicine

## 2014-07-04 NOTE — Telephone Encounter (Signed)
Post ED Visit - Positive Culture Follow-up  Culture report reviewed by antimicrobial stewardship pharmacist: []  Wes Harlan, Pharm.D., BCPS []  Heide Guile, Pharm.D., BCPS []  Alycia Rossetti, Pharm.D., BCPS []  [place], Pharm.D., BCPS, AAHIVP [x]  Legrand Como, Pharm.D., BCPS, AAHIVP []  Isac Sarna, Pharm.D., BCPS  Positive  group A strep Treated with cephalexin, organism sensitive to the same and no further patient follow-up is required at this time.  Hazle Nordmann 07/04/2014, 3:47 PM

## 2014-08-03 ENCOUNTER — Ambulatory Visit: Payer: Commercial Managed Care - HMO | Admitting: Neurology

## 2014-08-15 DIAGNOSIS — M5136 Other intervertebral disc degeneration, lumbar region: Secondary | ICD-10-CM | POA: Diagnosis not present

## 2014-08-23 ENCOUNTER — Other Ambulatory Visit (HOSPITAL_COMMUNITY): Payer: Self-pay | Admitting: Family Medicine

## 2014-08-23 DIAGNOSIS — E784 Other hyperlipidemia: Secondary | ICD-10-CM | POA: Diagnosis not present

## 2014-08-23 DIAGNOSIS — M2550 Pain in unspecified joint: Secondary | ICD-10-CM | POA: Diagnosis not present

## 2014-08-23 DIAGNOSIS — Z1231 Encounter for screening mammogram for malignant neoplasm of breast: Secondary | ICD-10-CM

## 2014-08-23 DIAGNOSIS — R5382 Chronic fatigue, unspecified: Secondary | ICD-10-CM | POA: Diagnosis not present

## 2014-08-23 DIAGNOSIS — J45909 Unspecified asthma, uncomplicated: Secondary | ICD-10-CM | POA: Diagnosis not present

## 2014-08-30 ENCOUNTER — Ambulatory Visit (HOSPITAL_COMMUNITY): Payer: Self-pay

## 2014-08-31 ENCOUNTER — Ambulatory Visit (HOSPITAL_COMMUNITY): Payer: Self-pay

## 2014-09-28 ENCOUNTER — Encounter: Payer: Self-pay | Admitting: Neurology

## 2014-09-28 ENCOUNTER — Ambulatory Visit (INDEPENDENT_AMBULATORY_CARE_PROVIDER_SITE_OTHER): Payer: Commercial Managed Care - HMO | Admitting: Neurology

## 2014-09-28 VITALS — BP 110/74 | HR 70 | Ht 62.0 in | Wt 142.4 lb

## 2014-09-28 DIAGNOSIS — F329 Major depressive disorder, single episode, unspecified: Secondary | ICD-10-CM | POA: Diagnosis not present

## 2014-09-28 DIAGNOSIS — G43709 Chronic migraine without aura, not intractable, without status migrainosus: Secondary | ICD-10-CM | POA: Diagnosis not present

## 2014-09-28 DIAGNOSIS — IMO0002 Reserved for concepts with insufficient information to code with codable children: Secondary | ICD-10-CM

## 2014-09-28 DIAGNOSIS — Z87828 Personal history of other (healed) physical injury and trauma: Secondary | ICD-10-CM | POA: Diagnosis not present

## 2014-09-28 DIAGNOSIS — F32A Depression, unspecified: Secondary | ICD-10-CM

## 2014-09-28 MED ORDER — ZONISAMIDE 25 MG PO CAPS: ORAL_CAPSULE | ORAL | Status: DC

## 2014-09-28 NOTE — Patient Instructions (Addendum)
1.  Start neck PT 2.  Start behavior therapy 3.  Start zonisamide 25mg  daily x 2 weeks, then increase to 2 tablets daily.  Sulfa allergy reviewed (hives). Stop medication, if new rash develops.  4.  MRI brain wo contrast 5.  You need to take time for yourself and start exploring new hobbies to keep your mind active and healthy  Return to clinic in 2 months
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
## 2014-09-29 ENCOUNTER — Other Ambulatory Visit (HOSPITAL_COMMUNITY): Payer: Self-pay | Admitting: Family Medicine

## 2014-09-29 ENCOUNTER — Ambulatory Visit (HOSPITAL_COMMUNITY)
Admission: RE | Admit: 2014-09-29 | Discharge: 2014-09-29 | Disposition: A | Discharge status: Home / Self Care | Payer: Commercial Managed Care - HMO | Source: Ambulatory Visit | Attending: Family Medicine | Admitting: Family Medicine

## 2014-09-29 DIAGNOSIS — M25511 Pain in right shoulder: Secondary | ICD-10-CM

## 2014-09-29 DIAGNOSIS — M25512 Pain in left shoulder: Secondary | ICD-10-CM

## 2014-09-29 DIAGNOSIS — Z1231 Encounter for screening mammogram for malignant neoplasm of breast: Secondary | ICD-10-CM | POA: Diagnosis not present

## 2014-09-29 DIAGNOSIS — Z9889 Other specified postprocedural states: Secondary | ICD-10-CM

## 2014-09-29 NOTE — Progress Notes (Signed)
Left message for patient to call me back.  MRI is at [facility] on September 23 at 11:00.  Arrive at 10:45.  Ask her the name of her PCP.

## 2014-10-02 ENCOUNTER — Telehealth: Payer: Self-pay | Admitting: Neurology

## 2014-10-02 NOTE — Telephone Encounter (Signed)
Tanya Velazquez/ returned your call/

## 2014-10-04 DIAGNOSIS — I110 Hypertensive heart disease with heart failure: Secondary | ICD-10-CM | POA: Diagnosis not present

## 2014-10-04 DIAGNOSIS — E784 Other hyperlipidemia: Secondary | ICD-10-CM | POA: Diagnosis not present

## 2014-10-04 DIAGNOSIS — E1165 Type 2 diabetes mellitus with hyperglycemia: Secondary | ICD-10-CM | POA: Diagnosis not present

## 2014-10-04 DIAGNOSIS — D510 Vitamin B12 deficiency anemia due to intrinsic factor deficiency: Secondary | ICD-10-CM | POA: Diagnosis not present

## 2014-10-04 DIAGNOSIS — K219 Gastro-esophageal reflux disease without esophagitis: Secondary | ICD-10-CM | POA: Diagnosis not present

## 2014-10-04 DIAGNOSIS — J45909 Unspecified asthma, uncomplicated: Secondary | ICD-10-CM | POA: Diagnosis not present

## 2014-10-04 DIAGNOSIS — R5383 Other fatigue: Secondary | ICD-10-CM | POA: Diagnosis not present

## 2014-10-04 DIAGNOSIS — R5382 Chronic fatigue, unspecified: Secondary | ICD-10-CM | POA: Diagnosis not present

## 2014-10-04 NOTE — Progress Notes (Signed)
Called for doctor's fax number.  LMTCB.  Patient notified about MRI appointment.

## 2014-10-06 ENCOUNTER — Ambulatory Visit (HOSPITAL_COMMUNITY)
Admission: RE | Admit: 2014-10-06 | Discharge: 2014-10-06 | Disposition: A | Discharge status: Home / Self Care | Payer: Commercial Managed Care - HMO | Source: Ambulatory Visit | Attending: Neurology | Admitting: Neurology

## 2014-10-06 DIAGNOSIS — G43909 Migraine, unspecified, not intractable, without status migrainosus: Secondary | ICD-10-CM | POA: Insufficient documentation

## 2014-10-06 DIAGNOSIS — G43709 Chronic migraine without aura, not intractable, without status migrainosus: Secondary | ICD-10-CM

## 2014-10-06 DIAGNOSIS — Z87828 Personal history of other (healed) physical injury and trauma: Secondary | ICD-10-CM

## 2014-10-06 DIAGNOSIS — F329 Major depressive disorder, single episode, unspecified: Secondary | ICD-10-CM

## 2014-10-06 DIAGNOSIS — F32A Depression, unspecified: Secondary | ICD-10-CM

## 2014-10-06 DIAGNOSIS — IMO0002 Reserved for concepts with insufficient information to code with codable children: Secondary | ICD-10-CM

## 2014-10-17 ENCOUNTER — Telehealth (HOSPITAL_COMMUNITY): Payer: Self-pay

## 2014-10-17 ENCOUNTER — Ambulatory Visit (HOSPITAL_COMMUNITY): Payer: Commercial Managed Care - HMO | Attending: Neurology

## 2014-10-17 NOTE — Telephone Encounter (Signed)
Pt forgot about appointment. Will call back later to reschedule.  11:31 AM  Etta Grandchild, PT, DPT [place] License # [id number]

## 2014-10-19 DIAGNOSIS — J45909 Unspecified asthma, uncomplicated: Secondary | ICD-10-CM | POA: Diagnosis not present

## 2014-10-19 DIAGNOSIS — K219 Gastro-esophageal reflux disease without esophagitis: Secondary | ICD-10-CM | POA: Diagnosis not present

## 2014-10-19 DIAGNOSIS — M2550 Pain in unspecified joint: Secondary | ICD-10-CM | POA: Diagnosis not present

## 2014-10-19 DIAGNOSIS — E662 Morbid (severe) obesity with alveolar hypoventilation: Secondary | ICD-10-CM | POA: Diagnosis not present

## 2014-11-01 ENCOUNTER — Ambulatory Visit (HOSPITAL_COMMUNITY): Payer: Self-pay | Admitting: Psychiatry

## 2014-12-11 ENCOUNTER — Ambulatory Visit: Payer: Commercial Managed Care - HMO | Admitting: Neurology

## 2014-12-15 DIAGNOSIS — E784 Other hyperlipidemia: Secondary | ICD-10-CM | POA: Diagnosis not present

## 2014-12-15 DIAGNOSIS — J45909 Unspecified asthma, uncomplicated: Secondary | ICD-10-CM | POA: Diagnosis not present

## 2014-12-15 DIAGNOSIS — M2550 Pain in unspecified joint: Secondary | ICD-10-CM | POA: Diagnosis not present

## 2014-12-15 DIAGNOSIS — E662 Morbid (severe) obesity with alveolar hypoventilation: Secondary | ICD-10-CM | POA: Diagnosis not present

## 2015-02-02 DIAGNOSIS — E784 Other hyperlipidemia: Secondary | ICD-10-CM | POA: Diagnosis not present

## 2015-02-02 DIAGNOSIS — M2550 Pain in unspecified joint: Secondary | ICD-10-CM | POA: Diagnosis not present

## 2015-02-02 DIAGNOSIS — J45909 Unspecified asthma, uncomplicated: Secondary | ICD-10-CM | POA: Diagnosis not present

## 2015-02-02 DIAGNOSIS — K219 Gastro-esophageal reflux disease without esophagitis: Secondary | ICD-10-CM | POA: Diagnosis not present

## 2015-02-21 ENCOUNTER — Telehealth: Payer: Self-pay | Admitting: Internal Medicine

## 2015-02-21 NOTE — Telephone Encounter (Signed)
PATIENT CALLED TO SCHEDULE FU OV AND HER [employer] CARD STATES SHE IS A PATIENT OF [place].  PATIENT STATED THAT SHE NO LONGER IS AND SEES DR. DONDIEGO.  I LET HER KNOW THAT SHE NEEDS TO CONTACT [employer] AND HAVE IT CHANGED AND GET A REFERRAL FROM HER PCP FOR THE OFFICE VISIT.

## 2015-02-21 NOTE — Telephone Encounter (Signed)
Reviewed

## 2015-02-27 DIAGNOSIS — R5382 Chronic fatigue, unspecified: Secondary | ICD-10-CM | POA: Diagnosis not present

## 2015-02-27 DIAGNOSIS — M2550 Pain in unspecified joint: Secondary | ICD-10-CM | POA: Diagnosis not present

## 2015-02-27 DIAGNOSIS — M545 Low back pain: Secondary | ICD-10-CM | POA: Diagnosis not present

## 2015-04-10 ENCOUNTER — Ambulatory Visit (HOSPITAL_COMMUNITY)
Admission: RE | Admit: 2015-04-10 | Discharge: 2015-04-10 | Disposition: A | Discharge status: Home / Self Care | Payer: Commercial Managed Care - HMO | Source: Ambulatory Visit | Attending: Family Medicine | Admitting: Family Medicine

## 2015-04-10 ENCOUNTER — Other Ambulatory Visit (HOSPITAL_COMMUNITY): Payer: Self-pay | Admitting: Family Medicine

## 2015-04-10 DIAGNOSIS — M4186 Other forms of scoliosis, lumbar region: Secondary | ICD-10-CM | POA: Insufficient documentation

## 2015-04-10 DIAGNOSIS — M5136 Other intervertebral disc degeneration, lumbar region: Secondary | ICD-10-CM | POA: Diagnosis not present

## 2015-04-10 DIAGNOSIS — M2550 Pain in unspecified joint: Secondary | ICD-10-CM | POA: Diagnosis not present

## 2015-04-10 DIAGNOSIS — E784 Other hyperlipidemia: Secondary | ICD-10-CM | POA: Diagnosis not present

## 2015-04-10 DIAGNOSIS — J45909 Unspecified asthma, uncomplicated: Secondary | ICD-10-CM | POA: Diagnosis not present

## 2015-04-10 DIAGNOSIS — M545 Low back pain: Secondary | ICD-10-CM | POA: Diagnosis not present

## 2015-04-10 DIAGNOSIS — M4806 Spinal stenosis, lumbar region: Secondary | ICD-10-CM | POA: Diagnosis not present

## 2015-05-14 DIAGNOSIS — E784 Other hyperlipidemia: Secondary | ICD-10-CM | POA: Diagnosis not present

## 2015-05-14 DIAGNOSIS — M545 Low back pain: Secondary | ICD-10-CM | POA: Diagnosis not present

## 2015-05-14 DIAGNOSIS — J45909 Unspecified asthma, uncomplicated: Secondary | ICD-10-CM | POA: Diagnosis not present

## 2015-05-14 DIAGNOSIS — M2550 Pain in unspecified joint: Secondary | ICD-10-CM | POA: Diagnosis not present

## 2015-06-12 DIAGNOSIS — J45909 Unspecified asthma, uncomplicated: Secondary | ICD-10-CM | POA: Diagnosis not present

## 2015-06-12 DIAGNOSIS — E784 Other hyperlipidemia: Secondary | ICD-10-CM | POA: Diagnosis not present

## 2015-06-12 DIAGNOSIS — M2550 Pain in unspecified joint: Secondary | ICD-10-CM | POA: Diagnosis not present

## 2015-06-12 DIAGNOSIS — M545 Low back pain: Secondary | ICD-10-CM | POA: Diagnosis not present

## 2015-07-12 DIAGNOSIS — I110 Hypertensive heart disease with heart failure: Secondary | ICD-10-CM | POA: Diagnosis not present

## 2015-07-12 DIAGNOSIS — M2550 Pain in unspecified joint: Secondary | ICD-10-CM | POA: Diagnosis not present

## 2015-07-12 DIAGNOSIS — Z020 Encounter for examination for admission to educational institution: Secondary | ICD-10-CM | POA: Diagnosis not present

## 2015-07-12 DIAGNOSIS — M545 Low back pain: Secondary | ICD-10-CM | POA: Diagnosis not present

## 2015-08-01 DIAGNOSIS — K219 Gastro-esophageal reflux disease without esophagitis: Secondary | ICD-10-CM | POA: Diagnosis not present

## 2015-08-01 DIAGNOSIS — R5382 Chronic fatigue, unspecified: Secondary | ICD-10-CM | POA: Diagnosis not present

## 2015-08-01 DIAGNOSIS — R21 Rash and other nonspecific skin eruption: Secondary | ICD-10-CM | POA: Diagnosis not present

## 2015-08-09 DIAGNOSIS — M545 Low back pain: Secondary | ICD-10-CM | POA: Diagnosis not present

## 2015-08-09 DIAGNOSIS — R5382 Chronic fatigue, unspecified: Secondary | ICD-10-CM | POA: Diagnosis not present

## 2015-08-09 DIAGNOSIS — M2550 Pain in unspecified joint: Secondary | ICD-10-CM | POA: Diagnosis not present

## 2015-08-09 DIAGNOSIS — K219 Gastro-esophageal reflux disease without esophagitis: Secondary | ICD-10-CM | POA: Diagnosis not present

## 2015-08-12 ENCOUNTER — Encounter (HOSPITAL_COMMUNITY): Payer: Self-pay

## 2015-08-12 ENCOUNTER — Emergency Department (HOSPITAL_COMMUNITY)
Admission: EM | Admit: 2015-08-12 | Discharge: 2015-08-12 | Disposition: A | Discharge status: Home / Self Care | Payer: Commercial Managed Care - HMO | Attending: Emergency Medicine | Admitting: Emergency Medicine

## 2015-08-12 ENCOUNTER — Emergency Department (HOSPITAL_COMMUNITY): Payer: Commercial Managed Care - HMO

## 2015-08-12 DIAGNOSIS — M549 Dorsalgia, unspecified: Secondary | ICD-10-CM | POA: Diagnosis not present

## 2015-08-12 DIAGNOSIS — Z79899 Other long term (current) drug therapy: Secondary | ICD-10-CM | POA: Insufficient documentation

## 2015-08-12 DIAGNOSIS — I10 Essential (primary) hypertension: Secondary | ICD-10-CM | POA: Diagnosis not present

## 2015-08-12 DIAGNOSIS — R0602 Shortness of breath: Secondary | ICD-10-CM | POA: Insufficient documentation

## 2015-08-12 DIAGNOSIS — R51 Headache: Secondary | ICD-10-CM | POA: Diagnosis not present

## 2015-08-12 DIAGNOSIS — J449 Chronic obstructive pulmonary disease, unspecified: Secondary | ICD-10-CM | POA: Insufficient documentation

## 2015-08-12 DIAGNOSIS — R05 Cough: Secondary | ICD-10-CM | POA: Insufficient documentation

## 2015-08-12 DIAGNOSIS — E119 Type 2 diabetes mellitus without complications: Secondary | ICD-10-CM | POA: Diagnosis not present

## 2015-08-12 DIAGNOSIS — Z7982 Long term (current) use of aspirin: Secondary | ICD-10-CM | POA: Insufficient documentation

## 2015-08-12 DIAGNOSIS — R110 Nausea: Secondary | ICD-10-CM | POA: Insufficient documentation

## 2015-08-12 DIAGNOSIS — F17210 Nicotine dependence, cigarettes, uncomplicated: Secondary | ICD-10-CM | POA: Diagnosis not present

## 2015-08-12 DIAGNOSIS — R079 Chest pain, unspecified: Secondary | ICD-10-CM

## 2015-08-12 DIAGNOSIS — R0789 Other chest pain: Secondary | ICD-10-CM | POA: Diagnosis not present

## 2015-08-12 LAB — BASIC METABOLIC PANEL
Anion gap: 5 (ref 5–15)
BUN: 14 mg/dL (ref 6–20)
CO2: 28 mmol/L (ref 22–32)
Calcium: 9.4 mg/dL (ref 8.9–10.3)
Chloride: 104 mmol/L (ref 101–111)
Creatinine, Ser: 1.13 mg/dL — ABNORMAL HIGH (ref 0.44–1.00)
GFR calc Af Amer: >60 mL/min (ref >60)
GFR calc non Af Amer: 54 mL/min — ABNORMAL LOW (ref >60)
Glucose, Bld: 98 mg/dL (ref 65–99)
Potassium: 3.1 mmol/L — ABNORMAL LOW (ref 3.5–5.1)
Sodium: 137 mmol/L (ref 135–145)

## 2015-08-12 LAB — CBC
HCT: 38.3 % (ref 36.0–46.0)
Hemoglobin: 12.7 g/dL (ref 12.0–15.0)
MCH: 30.6 pg (ref 26.0–34.0)
MCHC: 33.2 g/dL (ref 30.0–36.0)
MCV: 92.3 fL (ref 78.0–100.0)
Platelets: 197 10*3/uL (ref 150–400)
RBC: 4.15 MIL/uL (ref 3.87–5.11)
RDW: 14.8 % (ref 11.5–15.5)
WBC: 7.9 10*3/uL (ref 4.0–10.5)

## 2015-08-12 LAB — TROPONIN I: Troponin I: <0.03 ng/mL (ref <0.03)

## 2015-08-12 MED ORDER — KETOROLAC TROMETHAMINE 30 MG/ML IJ SOLN
15.0000 mg | Freq: Once | INTRAMUSCULAR | Status: AC
  Administered 2015-08-12: 15 mg via INTRAVENOUS
  Filled 2015-08-12: qty 1

## 2015-08-12 MED ORDER — MORPHINE SULFATE (PF) 4 MG/ML IV SOLN
4.0000 mg | Freq: Once | INTRAVENOUS | Status: AC
  Administered 2015-08-12: 4 mg via INTRAVENOUS
  Filled 2015-08-12: qty 1

## 2015-08-12 MED ORDER — POTASSIUM CHLORIDE CRYS ER 20 MEQ PO TBCR
40.0000 meq | EXTENDED_RELEASE_TABLET | Freq: Once | ORAL | Status: AC
  Administered 2015-08-12: 40 meq via ORAL
  Filled 2015-08-12: qty 2

## 2015-08-12 NOTE — ED Notes (Signed)
Patient states she fell earlier today in the bath tub. Also states pain to left ribs

## 2015-08-12 NOTE — ED Notes (Signed)
Pt returned from xray at this time.

## 2015-08-12 NOTE — ED Triage Notes (Signed)
Chest pain and SOB started yesterday. Left sided chest pain that is worse with deep breathing.

## 2015-08-12 NOTE — ED Notes (Signed)
Pt to xray at this time.

## 2015-08-12 NOTE — ED Provider Notes (Signed)
[facility]-EM[facility]Y DEPT Provider Note   CSN: [id number] Arrival date & time: 08/12/15  4098   First MD Initiated Contact with Patient 08/12/15 2143    By signing my name below, I, Bethel Born, attest that this documentation has been prepared under the direction and in the presence of Raeford Razor, MD. Electronically Signed: Bethel Born, ED Scribe. 08/12/15. 9:49 PM    History   Chief Complaint Chief Complaint  Patient presents with  . Chest Pain    HPI  The history is provided by the patient. No language interpreter was used.   Tanya Velazquez is a 55 y.o. female with PMHx of HTN, DM, GERD, COPD, and chronic back pain who presents to the Emergency Department complaining of constant, dull, left-sided chest pain with onset yesterday. The pain is exacerbated by deep breathing and coughing. Aspirin provided insufficient pain relief at home. Associated symptoms include headache,  back pain, nausea, SOB, and cough. Pt denies fever, vomiting, and LE swelling. She has no personal history of cardiac disease.   Past Medical History:  Diagnosis Date  . Anxiety   . B12 deficiency anemia   . Chronic constipation   . Chronic constipation   . Chronic low back pain    lumbar radiculopathy  . COPD (chronic obstructive pulmonary disease) (HCC)   . Diabetes mellitus   . DJD (degenerative joint disease)   . GERD (gastroesophageal reflux disease) 04/13/07   EGD Dr Rourk->patulous EG junction, HH, antral erosions  . H/O hiatal hernia   . Hyperplastic colon polyp 06/27/2010  . Hypertension   . Migraines   . Osteoarthritis   . Raspy voice   . Smokers' cough (HCC)   . SUI (stress urinary incontinence, female)     Patient Active Problem List   Diagnosis Date Noted  . RUQ pain 04/10/2014  . Lower abdominal pain 04/10/2014  . Abnormal LFTs 04/10/2014  . Intrahepatic bile duct dilation 04/10/2014  . Common bile duct dilatation 04/10/2014  . Constipation 06/11/2011  . History of  gastroesophageal reflux (GERD) 06/20/2010  . Family history of colon cancer 06/20/2010    Past Surgical History:  Procedure Laterality Date  . APPENDECTOMY    . BLADDER SUSPENSION  10/07/2011   Procedure: [facility] PROCEDURE;  Surgeon: Anner Crete, MD;  Location: [facility];  Service: Urology;  Laterality: N/A;  1 hour requested for this case   . CARDIAC CATHETERIZATION  08-09-2004 DR Jacinto Halim   NORMAL CORONARY ARTERIES/  NORMAL LVF  . CARDIAC CATHETERIZATION  2006   normal coronary arteries  . COLONOSCOPY  3/31/9   Dr Rourk->friable anal canal  . COLONOSCOPY  0 06/27/10   Dr. Jena Gauss hyperplastic polyp, prominent external hemorrhoid plexus. next tcs 06/2015.  . CYSTOSCOPY  10/07/2011   Procedure: CYSTOSCOPY;  Surgeon: Anner Crete, MD;  Location: [facility];  Service: Urology;  Laterality: N/A;  . ESOPHAGEAL MANOMETRY  2013   Dr. Alycia Rossetti at [facility]. incomplete bolus clearance with some breaks in contractions but there was peristalsis  . ESOPHAGOGASTRODUODENOSCOPY  06/27/10   Hiatal hernia, GERD,  . EXCISION VOLAR GANGLION LEFT WRIST  06-27-2005  . HEAD-UP TILT TABLE TEST  01-21-2007 &  11-04-2004  DR Alanda Amass   HX CHRONIC RECURRENT SYNCOPAL. EPICODES--  NEGATIVE RESULT INCLUDING ISUPREL INFUSION  . HEMORRHOID SURGERY  09-13-2003  . LAPAROSCOPIC CHOLECYSTECTOMY  02-23-2007  . LAPAROTOMY W/ APPENDCTOMY AND LEFT SALPINGO-OOPHECTOMY  AGE 62'S  . pH/impedence  2013   Dr. Alycia Rossetti at [facility].  Increased reflux off PPI with excellent correlation between reflux events and regurgitation. Patient advised to hold PPI for study by Dr. Derrill Kay.   Marland Kitchen RIGHT SHOULDER ARTHROSCOPY/ DISTAL CLAVICLE RESECTION / DEBRIDEMENT ROTATOR CUFF AND LABRUM TEAR/ BURSECTOMY  06-28-2010   IMPINGEMENT SYNDROME/ AC JOINT ARTHRITIS/ ROTATOR CUFF TENDINOPATHY  . SHOULDER ARTHROSCOPY  08-13-2010   LEFT SHOULDER IMPINGEMENT SYNDROME/ DJD OF AC JOINT  . STOMACH SURGERY  2013   antireflux surgery at [place]  age 9's    OB History    No data available       Home Medications    Prior to Admission medications   Medication Sig Start Date End Date Taking? Authorizing Provider  amitriptyline (ELAVIL) 100 MG tablet Take 100 mg by mouth at bedtime.    Historical Provider, MD  aspirin EC 81 MG tablet Take 81 mg by mouth every other day.     Historical Provider, MD  baclofen (LIORESAL) 10 MG tablet  09/07/14   Historical Provider, MD  cephALEXin (KEFLEX) 500 MG capsule Take 1 capsule (500 mg total) by mouth 4 (four) times daily. For 7 days 07/01/14   Tammy Triplett, PA-C  cholecalciferol (VITAMIN D) 1000 UNITS tablet Take 1,000 Units by mouth daily.    Historical Provider, MD  citalopram (CELEXA) 20 MG tablet  09/07/14   Historical Provider, MD  cyanocobalamin (,VITAMIN B-12,) 1000 MCG/ML injection Inject 1,000 mcg into the muscle every 30 (thirty) days.     Historical Provider, MD  dexlansoprazole (DEXILANT) 60 MG capsule Take 1 capsule (60 mg total) by mouth 2 (two) times daily before a meal. Patient taking differently: Take 60 mg by mouth daily.  08/19/12   Orvil Feil, NP  docusate sodium (COLACE) 100 MG capsule Take 1 capsule (100 mg total) by mouth 2 (two) times daily. Patient taking differently: Take 100 mg by mouth daily.  10/07/11   Irine Seal, MD  enalapril (VASOTEC) 2.5 MG tablet Take 2.5 mg by mouth every morning.  06/11/10   Historical Provider, MD  furosemide (LASIX) 40 MG tablet Take 40 mg by mouth every morning.    Historical Provider, MD  ibuprofen (ADVIL,MOTRIN) 200 MG tablet Take 400 mg by mouth every 6 (six) hours as needed for pain.     Historical Provider, MD  LINZESS 290 MCG CAPS capsule TAKE ONE (1) CAPSULE EACH DAY 03/14/13   Orvil Feil, NP  pravastatin (PRAVACHOL) 20 MG tablet Take 1 tablet by mouth daily. 12/17/12   Historical Provider, MD  PROAIR HFA 108 (90 BASE) MCG/ACT inhaler Inhale 1 puff into the lungs every 4 (four) hours as needed.  05/13/10    Historical Provider, MD  traMADol (ULTRAM) 50 MG tablet Take 1 tablet (50 mg total) by mouth every 6 (six) hours as needed. 07/01/14   Tammy Triplett, PA-C  triamcinolone cream (KENALOG) 0.1 %  09/07/14   Historical Provider, MD  zonisamide (ZONEGRAN) 25 MG capsule Take 1 tablet daily for two weeks, then increase to 2 tablets daily. 09/28/14   Alda Berthold, DO    Family History Family History  Problem Relation Age of Onset  . Colon cancer Mother 71  . Hypertension Mother   . Alzheimer's disease Father   . Hypertension Father   . Healthy Daughter     Social History Social History  Substance Use Topics  . Smoking status: Current Every Day Smoker    Packs/day: 1.00    Years: 35.00  Types: Cigarettes  . Smokeless tobacco: Never Used  . Alcohol use No     Allergies   Sulfa antibiotics; Amicinonide-benzyl alcohol [cyclocort]; Amoxicillin; Doxycycline; Paroxetine hcl; Penicillins; and Trimethoprim   Review of Systems Review of Systems  Constitutional: Negative for fever.  Respiratory: Positive for cough and shortness of breath.   Cardiovascular: Positive for chest pain. Negative for leg swelling.  Gastrointestinal: Positive for nausea. Negative for vomiting.  Musculoskeletal: Positive for back pain.  Neurological: Positive for headaches.  All other systems reviewed and are negative.    Physical Exam Updated Vital Signs BP 104/82   Pulse 86   Temp 99.4 F (37.4 C) (Oral)   Resp 18   Ht 5\' 2"  (1.575 m)   Wt 137 lb (62.1 kg)   SpO2 98%   BMI 25.06 kg/m   Physical Exam  Constitutional: She is oriented to person, place, and time. She appears well-developed and well-nourished.  HENT:  Head: Normocephalic.  Eyes: EOM are normal.  Neck: Normal range of motion.  Cardiovascular: Normal rate and regular rhythm.   Tenderness along sternum and left chest wall   Pulmonary/Chest: Effort normal.  Abdominal: She exhibits no distension.  Musculoskeletal: Normal range of  motion.  Neurological: She is alert and oriented to person, place, and time.  Psychiatric: She has a normal mood and affect.  Nursing note and vitals reviewed.    ED Treatments / Results  Labs (all labs ordered are listed, but only abnormal results are displayed) Labs Reviewed  BASIC METABOLIC PANEL - Abnormal; Notable for the following:       Result Value   Potassium 3.1 (*)    Creatinine, Ser 1.13 (*)    GFR calc non Af Amer 54 (*)    All other components within normal limits  CBC  TROPONIN I    EKG  EKG Interpretation  Date/Time:  Sunday August 12 2015 19:05:57 EDT Ventricular Rate:  97 PR Interval:  152 QRS Duration: 76 QT Interval:  356 QTC Calculation: 452 R Axis:   32 Text Interpretation:  Normal sinus rhythm Cannot rule out Anterior infarct , age undetermined Abnormal ECG Confirmed by Beezley Singer  MD, [place] [phone]) on 08/12/2015 9:09:58 PM       Radiology Dg Chest 2 View  Result Date: 08/12/2015 CLINICAL DATA:  Chest pain and shortness of breath starting yesterday. Left-sided chest pain worsened with deep breathing. EXAM: CHEST  2 VIEW COMPARISON:  Chest x-rays dated 03/27/2014 and 08/31/2012. FINDINGS: Heart size is normal. Overall cardiomediastinal silhouette remains normal in size and configuration. Suspect chronic bronchitic changes within the perihilar regions. Lungs otherwise clear. No evidence of pneumonia. No pleural effusion or pneumothorax seen. Chronic compression deformity noted within the mid thoracic spine. Mild degenerative spurring within the thoracic spine. No acute or suspicious osseous finding. IMPRESSION: 1. No acute findings.  No evidence of pneumonia. 2. Probable chronic bronchitic changes. 3. Old compression deformity of a mid thoracic vertebral body, perhaps slightly progressed compared to the earlier exams. No acute-appearing osseous abnormality. Electronically Signed   By: Franki Cabot M.D.   On: 08/12/2015 19:56   Procedures Procedures (including  critical care time)  Medications Ordered in ED Medications  potassium chloride SA (K-DUR,KLOR-CON) CR tablet 40 mEq (40 mEq Oral Given 08/12/15 2131)  ketorolac (TORADOL) 30 MG/ML injection 15 mg (15 mg Intravenous Given 08/12/15 2203)  morphine 4 MG/ML injection 4 mg (4 mg Intravenous Given 08/12/15 2203)     Initial Impression / Assessment and Plan /  ED Course  I have reviewed the triage vital signs and the nursing notes.  COORDINATION OF CARE: 9:47 PM Discussed treatment plan which includes lab work, CXR, EKG with pt at bedside and pt agreed to plan.  Pertinent labs & imaging results that were available during my care of the patient were reviewed by me and considered in my medical decision making (see chart for details).  Clinical Course    55 year old female with chest pain. Doubt ACS, PE, dissection or other emergent process.  Final Clinical Impressions(s) / ED Diagnoses   Final diagnoses:  Chest pain, unspecified chest pain type    New Prescriptions New Prescriptions   No medications on file    I personally preformed the services scribed in my presence. The recorded information has been reviewed is accurate. Virgel Manifold, MD.     Virgel Manifold, MD 08/20/15 1256

## 2015-08-17 DIAGNOSIS — M545 Low back pain: Secondary | ICD-10-CM | POA: Diagnosis not present

## 2015-08-17 DIAGNOSIS — M2550 Pain in unspecified joint: Secondary | ICD-10-CM | POA: Diagnosis not present

## 2015-08-17 DIAGNOSIS — R079 Chest pain, unspecified: Secondary | ICD-10-CM | POA: Diagnosis not present

## 2015-08-17 DIAGNOSIS — R5382 Chronic fatigue, unspecified: Secondary | ICD-10-CM | POA: Diagnosis not present

## 2015-08-22 DIAGNOSIS — M542 Cervicalgia: Secondary | ICD-10-CM | POA: Diagnosis not present

## 2015-08-22 DIAGNOSIS — M2550 Pain in unspecified joint: Secondary | ICD-10-CM | POA: Diagnosis not present

## 2015-08-22 DIAGNOSIS — E784 Other hyperlipidemia: Secondary | ICD-10-CM | POA: Diagnosis not present

## 2015-08-22 DIAGNOSIS — R5382 Chronic fatigue, unspecified: Secondary | ICD-10-CM | POA: Diagnosis not present

## 2015-09-06 DIAGNOSIS — I110 Hypertensive heart disease with heart failure: Secondary | ICD-10-CM | POA: Diagnosis not present

## 2015-09-06 DIAGNOSIS — M542 Cervicalgia: Secondary | ICD-10-CM | POA: Diagnosis not present

## 2015-09-06 DIAGNOSIS — M2550 Pain in unspecified joint: Secondary | ICD-10-CM | POA: Diagnosis not present

## 2015-09-06 DIAGNOSIS — R5382 Chronic fatigue, unspecified: Secondary | ICD-10-CM | POA: Diagnosis not present

## 2015-10-08 DIAGNOSIS — M549 Dorsalgia, unspecified: Secondary | ICD-10-CM | POA: Diagnosis not present

## 2015-10-08 DIAGNOSIS — M2550 Pain in unspecified joint: Secondary | ICD-10-CM | POA: Diagnosis not present

## 2015-10-08 DIAGNOSIS — I119 Hypertensive heart disease without heart failure: Secondary | ICD-10-CM | POA: Diagnosis not present

## 2015-10-08 DIAGNOSIS — R5382 Chronic fatigue, unspecified: Secondary | ICD-10-CM | POA: Diagnosis not present

## 2015-11-07 DIAGNOSIS — J45909 Unspecified asthma, uncomplicated: Secondary | ICD-10-CM | POA: Diagnosis not present

## 2015-11-07 DIAGNOSIS — M545 Low back pain: Secondary | ICD-10-CM | POA: Diagnosis not present

## 2015-11-07 DIAGNOSIS — E784 Other hyperlipidemia: Secondary | ICD-10-CM | POA: Diagnosis not present

## 2015-11-07 DIAGNOSIS — Z23 Encounter for immunization: Secondary | ICD-10-CM | POA: Diagnosis not present

## 2015-11-07 DIAGNOSIS — M2550 Pain in unspecified joint: Secondary | ICD-10-CM | POA: Diagnosis not present

## 2015-12-04 DIAGNOSIS — K219 Gastro-esophageal reflux disease without esophagitis: Secondary | ICD-10-CM | POA: Diagnosis not present

## 2015-12-04 DIAGNOSIS — M2550 Pain in unspecified joint: Secondary | ICD-10-CM | POA: Diagnosis not present

## 2015-12-04 DIAGNOSIS — M545 Low back pain: Secondary | ICD-10-CM | POA: Diagnosis not present

## 2015-12-04 DIAGNOSIS — E784 Other hyperlipidemia: Secondary | ICD-10-CM | POA: Diagnosis not present

## 2015-12-14 DIAGNOSIS — Z79899 Other long term (current) drug therapy: Secondary | ICD-10-CM | POA: Insufficient documentation

## 2015-12-14 DIAGNOSIS — E119 Type 2 diabetes mellitus without complications: Secondary | ICD-10-CM | POA: Insufficient documentation

## 2015-12-14 DIAGNOSIS — J449 Chronic obstructive pulmonary disease, unspecified: Secondary | ICD-10-CM | POA: Diagnosis not present

## 2015-12-14 DIAGNOSIS — K5900 Constipation, unspecified: Secondary | ICD-10-CM | POA: Insufficient documentation

## 2015-12-14 DIAGNOSIS — M5442 Lumbago with sciatica, left side: Secondary | ICD-10-CM | POA: Insufficient documentation

## 2015-12-14 DIAGNOSIS — F17210 Nicotine dependence, cigarettes, uncomplicated: Secondary | ICD-10-CM | POA: Insufficient documentation

## 2015-12-14 DIAGNOSIS — Z7982 Long term (current) use of aspirin: Secondary | ICD-10-CM | POA: Diagnosis not present

## 2015-12-14 DIAGNOSIS — I10 Essential (primary) hypertension: Secondary | ICD-10-CM | POA: Insufficient documentation

## 2015-12-14 DIAGNOSIS — M5432 Sciatica, left side: Secondary | ICD-10-CM | POA: Diagnosis not present

## 2015-12-14 DIAGNOSIS — M545 Low back pain: Secondary | ICD-10-CM | POA: Diagnosis present

## 2015-12-15 ENCOUNTER — Encounter (HOSPITAL_COMMUNITY): Payer: Self-pay | Admitting: Emergency Medicine

## 2015-12-15 ENCOUNTER — Emergency Department (HOSPITAL_COMMUNITY)
Admission: EM | Admit: 2015-12-15 | Discharge: 2015-12-15 | Disposition: A | Discharge status: Home / Self Care | Payer: Commercial Managed Care - HMO | Attending: Emergency Medicine | Admitting: Emergency Medicine

## 2015-12-15 DIAGNOSIS — M5432 Sciatica, left side: Secondary | ICD-10-CM

## 2015-12-15 DIAGNOSIS — M5442 Lumbago with sciatica, left side: Secondary | ICD-10-CM | POA: Diagnosis not present

## 2015-12-15 MED ORDER — DEXAMETHASONE SODIUM PHOSPHATE 4 MG/ML IJ SOLN
8.0000 mg | Freq: Once | INTRAMUSCULAR | Status: AC
Start: 2015-12-15 — End: 2015-12-15
  Administered 2015-12-15: 8 mg via INTRAMUSCULAR

## 2015-12-15 MED ORDER — CYCLOBENZAPRINE HCL 10 MG PO TABS: 10.0000 mg | ORAL_TABLET | Freq: Three times a day (TID) | ORAL | 0 refills | Status: DC

## 2015-12-15 MED ORDER — ONDANSETRON HCL 4 MG PO TABS
4.0000 mg | ORAL_TABLET | Freq: Once | ORAL | Status: AC
  Administered 2015-12-15: 4 mg via ORAL

## 2015-12-15 MED ORDER — DICLOFENAC SODIUM 75 MG PO TBEC: 75.0000 mg | DELAYED_RELEASE_TABLET | Freq: Two times a day (BID) | ORAL | 0 refills | Status: DC

## 2015-12-15 MED ORDER — DIAZEPAM 5 MG PO TABS
10.0000 mg | ORAL_TABLET | Freq: Once | ORAL | Status: AC
  Administered 2015-12-15: 10 mg via ORAL

## 2015-12-15 MED ORDER — DEXAMETHASONE 4 MG PO TABS: 4.0000 mg | ORAL_TABLET | Freq: Two times a day (BID) | ORAL | 0 refills | Status: DC

## 2015-12-15 MED ORDER — KETOROLAC TROMETHAMINE 10 MG PO TABS
10.0000 mg | ORAL_TABLET | Freq: Once | ORAL | Status: AC
  Administered 2015-12-15: 10 mg via ORAL

## 2015-12-15 NOTE — ED Notes (Signed)
Sima Matas in to assess

## 2015-12-15 NOTE — Discharge Instructions (Signed)
Please apply heating pad. Use decadron, diclofenac,and flexeril daily until seen by Dr Cindie Laroche.

## 2015-12-15 NOTE — ED Provider Notes (Signed)
[place] DEPT Provider Note   CSN: [id number] Arrival date & time: 12/14/15  2356     History   Chief Complaint Chief Complaint  Patient presents with  . Back Pain    for several weeks    HPI Tanya Velazquez is a 55 y.o. female.  The history is provided by the patient.  Back Pain   This is a chronic problem. The current episode started more than 1 week ago. The problem occurs daily. The problem has been gradually worsening. The pain is associated with no known injury. The pain is present in the lumbar spine. The quality of the pain is described as shooting and aching. The pain radiates to the left thigh. The pain is moderate. Exacerbated by: standing and walking. Associated symptoms include tingling. Pertinent negatives include no numbness, no bowel incontinence, no perianal numbness and no bladder incontinence. She has tried heat for the symptoms.    Past Medical History:  Diagnosis Date  . Anxiety   . B12 deficiency anemia   . Chronic constipation   . Chronic constipation   . Chronic low back pain    lumbar radiculopathy  . COPD (chronic obstructive pulmonary disease) ([place])   . Diabetes mellitus   . DJD (degenerative joint disease)   . GERD (gastroesophageal reflux disease) 04/13/07   EGD Dr Rourk->patulous EG junction, HH, antral erosions  . H/O hiatal hernia   . Hyperplastic colon polyp 06/27/2010  . Hypertension   . Migraines   . Osteoarthritis   . Raspy voice   . Smokers' cough ([place])   . SUI (stress urinary incontinence, female)     Patient Active Problem List   Diagnosis Date Noted  . RUQ pain 04/10/2014  . Lower abdominal pain 04/10/2014  . Abnormal LFTs 04/10/2014  . Intrahepatic bile duct dilation 04/10/2014  . Common bile duct dilatation 04/10/2014  . Constipation 06/11/2011  . History of gastroesophageal reflux (GERD) 06/20/2010  . Family history of colon cancer 06/20/2010    Past Surgical History:  Procedure Laterality Date  .  APPENDECTOMY    . BLADDER SUSPENSION  10/07/2011   Procedure: [facility] PROCEDURE;  Surgeon: Malka So, MD;  Location: [facility];  Service: Urology;  Laterality: N/A;  1 hour requested for this case   . CARDIAC CATHETERIZATION  08-09-2004 DR Einar Gip   NORMAL CORONARY ARTERIES/  NORMAL LVF  . CARDIAC CATHETERIZATION  2006   normal coronary arteries  . COLONOSCOPY  3/31/9   Dr Rourk->friable anal canal  . COLONOSCOPY  0 06/27/10   Dr. Gala Romney hyperplastic polyp, prominent external hemorrhoid plexus. next tcs 06/2015.  . CYSTOSCOPY  10/07/2011   Procedure: CYSTOSCOPY;  Surgeon: Malka So, MD;  Location: [facility];  Service: Urology;  Laterality: N/A;  . ESOPHAGEAL MANOMETRY  2013   Dr. Derrill Kay at [facility]. incomplete bolus clearance with some breaks in contractions but there was peristalsis  . ESOPHAGOGASTRODUODENOSCOPY  06/27/10   Hiatal hernia, GERD,  . EXCISION VOLAR GANGLION LEFT WRIST  06-27-2005  . HEAD-UP TILT TABLE TEST  01-21-2007 &  11-04-2004  DR Rollene Fare   HX CHRONIC RECURRENT SYNCOPAL. EPICODES--  NEGATIVE RESULT INCLUDING ISUPREL INFUSION  . HEMORRHOID SURGERY  09-13-2003  . LAPAROSCOPIC CHOLECYSTECTOMY  02-23-2007  . LAPAROTOMY W/ APPENDCTOMY AND LEFT SALPINGO-OOPHECTOMY  AGE 27'S  . pH/impedence  2013   Dr. Derrill Kay at [facility]. Increased reflux off PPI with excellent correlation between reflux events and regurgitation. Patient advised to hold PPI for  study by Dr. Derrill Kay.   Marland Kitchen RIGHT SHOULDER ARTHROSCOPY/ DISTAL CLAVICLE RESECTION / DEBRIDEMENT ROTATOR CUFF AND LABRUM TEAR/ BURSECTOMY  06-28-2010   IMPINGEMENT SYNDROME/ AC JOINT ARTHRITIS/ ROTATOR CUFF TENDINOPATHY  . SHOULDER ARTHROSCOPY  08-13-2010   LEFT SHOULDER IMPINGEMENT SYNDROME/ DJD OF AC JOINT  . STOMACH SURGERY  2013   antireflux surgery at [place]  age 27's    OB History    No data available       Home Medications    Prior to Admission medications     Medication Sig Start Date End Date Taking? Authorizing Provider  amitriptyline (ELAVIL) 100 MG tablet Take 100 mg by mouth at bedtime.    Historical Provider, MD  aspirin EC 81 MG tablet Take 81 mg by mouth every other day.     Historical Provider, MD  baclofen (LIORESAL) 10 MG tablet  09/07/14   Historical Provider, MD  cephALEXin (KEFLEX) 500 MG capsule Take 1 capsule (500 mg total) by mouth 4 (four) times daily. For 7 days 07/01/14   Tammy Triplett, PA-C  cholecalciferol (VITAMIN D) 1000 UNITS tablet Take 1,000 Units by mouth daily.    Historical Provider, MD  citalopram (CELEXA) 20 MG tablet  09/07/14   Historical Provider, MD  cyanocobalamin (,VITAMIN B-12,) 1000 MCG/ML injection Inject 1,000 mcg into the muscle every 30 (thirty) days.     Historical Provider, MD  dexlansoprazole (DEXILANT) 60 MG capsule Take 1 capsule (60 mg total) by mouth 2 (two) times daily before a meal. Patient taking differently: Take 60 mg by mouth daily.  08/19/12   Annitta Needs, NP  docusate sodium (COLACE) 100 MG capsule Take 1 capsule (100 mg total) by mouth 2 (two) times daily. Patient taking differently: Take 100 mg by mouth daily.  10/07/11   Irine Seal, MD  enalapril (VASOTEC) 2.5 MG tablet Take 2.5 mg by mouth every morning.  06/11/10   Historical Provider, MD  furosemide (LASIX) 40 MG tablet Take 40 mg by mouth every morning.    Historical Provider, MD  ibuprofen (ADVIL,MOTRIN) 200 MG tablet Take 400 mg by mouth every 6 (six) hours as needed for pain.     Historical Provider, MD  Rolan Lipa 290 MCG CAPS capsule TAKE ONE (1) CAPSULE EACH DAY 03/14/13   Annitta Needs, NP  pravastatin (PRAVACHOL) 20 MG tablet Take 1 tablet by mouth daily. 12/17/12   Historical Provider, MD  PROAIR HFA 108 (90 BASE) MCG/ACT inhaler Inhale 1 puff into the lungs every 4 (four) hours as needed.  05/13/10   Historical Provider, MD  traMADol (ULTRAM) 50 MG tablet Take 1 tablet (50 mg total) by mouth every 6 (six) hours as needed. 07/01/14   Tammy  Triplett, PA-C  triamcinolone cream (KENALOG) 0.1 %  09/07/14   Historical Provider, MD  zonisamide (ZONEGRAN) 25 MG capsule Take 1 tablet daily for two weeks, then increase to 2 tablets daily. 09/28/14   Alda Berthold, DO    Family History Family History  Problem Relation Age of Onset  . Colon cancer Mother 51  . Hypertension Mother   . Alzheimer's disease Father   . Hypertension Father   . Healthy Daughter     Social History Social History  Substance Use Topics  . Smoking status: Current Every Day Smoker    Packs/day: 1.00    Years: 35.00    Types: Cigarettes  . Smokeless tobacco: Never Used  . Alcohol use No  Allergies   Sulfa antibiotics; Amicinonide-benzyl alcohol [cyclocort]; Amoxicillin; Doxycycline; Paroxetine hcl; Penicillins; and Trimethoprim   Review of Systems Review of Systems  Gastrointestinal: Positive for constipation. Negative for bowel incontinence.  Genitourinary: Negative for bladder incontinence.  Musculoskeletal: Positive for arthralgias and back pain.  Neurological: Positive for tingling. Negative for numbness.  All other systems reviewed and are negative.    Physical Exam Updated Vital Signs BP 119/73 (BP Location: Left Arm)   Pulse 84   Temp 97.8 F (36.6 C) (Oral)   Resp 21   Ht 5\' 2"  (1.575 m)   Wt 59 kg   SpO2 98%   BMI 23.78 kg/m   Physical Exam  Constitutional: She is oriented to person, place, and time. She appears well-developed and well-nourished.  Non-toxic appearance.  HENT:  Head: Normocephalic.  Right Ear: Tympanic membrane and external ear normal.  Left Ear: Tympanic membrane and external ear normal.  Eyes: EOM and lids are normal. Pupils are equal, round, and reactive to light.  Neck: Normal range of motion. Neck supple. Carotid bruit is not present.  Cardiovascular: Normal rate, regular rhythm, normal heart sounds, intact distal pulses and normal pulses.   Pulmonary/Chest: Breath sounds normal. No respiratory  distress.  Abdominal: Soft. Bowel sounds are normal. There is no tenderness. There is no guarding.  Musculoskeletal:       Lumbar back: She exhibits decreased range of motion, pain and spasm.  SLR pain at 30 degrees on the left.  Lymphadenopathy:       Head (right side): No submandibular adenopathy present.       Head (left side): No submandibular adenopathy present.    She has no cervical adenopathy.  Neurological: She is alert and oriented to person, place, and time. She has normal strength. No cranial nerve deficit or sensory deficit.  Skin: Skin is warm and dry.  Psychiatric: She has a normal mood and affect. Her speech is normal.  Nursing note and vitals reviewed.    ED Treatments / Results  Labs (all labs ordered are listed, but only abnormal results are displayed) Labs Reviewed - No data to display  EKG  EKG Interpretation None       Radiology No results found.  Procedures Procedures (including critical care time)  Medications Ordered in ED Medications - No data to display   Initial Impression / Assessment and Plan / ED Course  I have reviewed the triage vital signs and the nursing notes.  Pertinent labs & imaging results that were available during my care of the patient were reviewed by me and considered in my medical decision making (see chart for details).  Clinical Course     **I have reviewed nursing notes, vital signs, and all appropriate lab and imaging results for this patient.*  Final Clinical Impressions(s) / ED Diagnoses  Vital signs reviewed. Exam favors sciatica. Review of previous xrays and scans suggest DDD at the L4L5 level. No evidence for caudal equina at this time. Pt to see Dr Cindie Laroche on Monday. Rx for flexeril, decadron, diclofenac given to the patient.   Final diagnoses:  Sciatica of left side    New Prescriptions New Prescriptions   No medications on file     Lily Kocher, PA-C 12/15/15 [place],  MD 12/15/15 [phone]

## 2015-12-15 NOTE — ED Triage Notes (Signed)
Several week  history of back pain with radiation to her L hip and leg

## 2015-12-19 ENCOUNTER — Other Ambulatory Visit (HOSPITAL_COMMUNITY): Payer: Self-pay | Admitting: Family Medicine

## 2015-12-19 DIAGNOSIS — R5382 Chronic fatigue, unspecified: Secondary | ICD-10-CM | POA: Diagnosis not present

## 2015-12-19 DIAGNOSIS — M545 Low back pain: Secondary | ICD-10-CM | POA: Diagnosis not present

## 2015-12-19 DIAGNOSIS — M2550 Pain in unspecified joint: Secondary | ICD-10-CM | POA: Diagnosis not present

## 2015-12-25 ENCOUNTER — Ambulatory Visit (HOSPITAL_COMMUNITY): Payer: Commercial Managed Care - HMO

## 2016-01-01 ENCOUNTER — Ambulatory Visit (HOSPITAL_COMMUNITY)
Admission: RE | Admit: 2016-01-01 | Discharge: 2016-01-01 | Disposition: A | Discharge status: Home / Self Care | Payer: Commercial Managed Care - HMO | Source: Ambulatory Visit | Attending: Family Medicine | Admitting: Family Medicine

## 2016-01-01 DIAGNOSIS — M545 Low back pain: Secondary | ICD-10-CM | POA: Insufficient documentation

## 2016-01-01 DIAGNOSIS — M5136 Other intervertebral disc degeneration, lumbar region: Secondary | ICD-10-CM | POA: Insufficient documentation

## 2016-01-01 DIAGNOSIS — Z9049 Acquired absence of other specified parts of digestive tract: Secondary | ICD-10-CM | POA: Insufficient documentation

## 2016-01-01 DIAGNOSIS — M48061 Spinal stenosis, lumbar region without neurogenic claudication: Secondary | ICD-10-CM | POA: Diagnosis not present

## 2016-01-03 DIAGNOSIS — E784 Other hyperlipidemia: Secondary | ICD-10-CM | POA: Diagnosis not present

## 2016-01-03 DIAGNOSIS — M545 Low back pain: Secondary | ICD-10-CM | POA: Diagnosis not present

## 2016-01-03 DIAGNOSIS — J45909 Unspecified asthma, uncomplicated: Secondary | ICD-10-CM | POA: Diagnosis not present

## 2016-01-03 DIAGNOSIS — M2550 Pain in unspecified joint: Secondary | ICD-10-CM | POA: Diagnosis not present

## 2016-02-04 DIAGNOSIS — R5382 Chronic fatigue, unspecified: Secondary | ICD-10-CM | POA: Diagnosis not present

## 2016-02-04 DIAGNOSIS — M545 Low back pain: Secondary | ICD-10-CM | POA: Diagnosis not present

## 2016-02-04 DIAGNOSIS — M2550 Pain in unspecified joint: Secondary | ICD-10-CM | POA: Diagnosis not present

## 2016-02-13 DIAGNOSIS — M5127 Other intervertebral disc displacement, lumbosacral region: Secondary | ICD-10-CM | POA: Diagnosis not present

## 2016-02-13 DIAGNOSIS — M5126 Other intervertebral disc displacement, lumbar region: Secondary | ICD-10-CM | POA: Diagnosis not present

## 2016-02-25 DIAGNOSIS — Z6826 Body mass index (BMI) 26.0-26.9, adult: Secondary | ICD-10-CM | POA: Diagnosis not present

## 2016-02-25 DIAGNOSIS — M5126 Other intervertebral disc displacement, lumbar region: Secondary | ICD-10-CM | POA: Diagnosis not present

## 2016-02-25 DIAGNOSIS — I10 Essential (primary) hypertension: Secondary | ICD-10-CM | POA: Diagnosis not present

## 2016-02-25 DIAGNOSIS — M5416 Radiculopathy, lumbar region: Secondary | ICD-10-CM | POA: Diagnosis not present

## 2016-02-27 DIAGNOSIS — Z6826 Body mass index (BMI) 26.0-26.9, adult: Secondary | ICD-10-CM | POA: Diagnosis not present

## 2016-02-27 DIAGNOSIS — M5416 Radiculopathy, lumbar region: Secondary | ICD-10-CM | POA: Diagnosis not present

## 2016-02-27 DIAGNOSIS — I10 Essential (primary) hypertension: Secondary | ICD-10-CM | POA: Diagnosis not present

## 2016-02-27 DIAGNOSIS — M5126 Other intervertebral disc displacement, lumbar region: Secondary | ICD-10-CM | POA: Diagnosis not present

## 2016-03-06 DIAGNOSIS — E784 Other hyperlipidemia: Secondary | ICD-10-CM | POA: Diagnosis not present

## 2016-03-06 DIAGNOSIS — M2550 Pain in unspecified joint: Secondary | ICD-10-CM | POA: Diagnosis not present

## 2016-03-06 DIAGNOSIS — K219 Gastro-esophageal reflux disease without esophagitis: Secondary | ICD-10-CM | POA: Diagnosis not present

## 2016-03-06 DIAGNOSIS — M545 Low back pain: Secondary | ICD-10-CM | POA: Diagnosis not present

## 2016-04-02 DIAGNOSIS — K219 Gastro-esophageal reflux disease without esophagitis: Secondary | ICD-10-CM | POA: Diagnosis not present

## 2016-04-02 DIAGNOSIS — E784 Other hyperlipidemia: Secondary | ICD-10-CM | POA: Diagnosis not present

## 2016-04-02 DIAGNOSIS — M2550 Pain in unspecified joint: Secondary | ICD-10-CM | POA: Diagnosis not present

## 2016-04-02 DIAGNOSIS — M545 Low back pain: Secondary | ICD-10-CM | POA: Diagnosis not present

## 2016-05-01 DIAGNOSIS — M2550 Pain in unspecified joint: Secondary | ICD-10-CM | POA: Diagnosis not present

## 2016-05-01 DIAGNOSIS — J45909 Unspecified asthma, uncomplicated: Secondary | ICD-10-CM | POA: Diagnosis not present

## 2016-05-01 DIAGNOSIS — M545 Low back pain: Secondary | ICD-10-CM | POA: Diagnosis not present

## 2016-05-01 DIAGNOSIS — K219 Gastro-esophageal reflux disease without esophagitis: Secondary | ICD-10-CM | POA: Diagnosis not present

## 2016-05-16 DIAGNOSIS — M5126 Other intervertebral disc displacement, lumbar region: Secondary | ICD-10-CM | POA: Diagnosis not present

## 2016-05-22 ENCOUNTER — Other Ambulatory Visit: Payer: Self-pay | Admitting: Neurosurgery

## 2016-05-22 DIAGNOSIS — M5126 Other intervertebral disc displacement, lumbar region: Secondary | ICD-10-CM

## 2016-05-29 ENCOUNTER — Ambulatory Visit
Admission: RE | Admit: 2016-05-29 | Discharge: 2016-05-29 | Disposition: A | Discharge status: Home / Self Care | Payer: Medicare HMO | Source: Ambulatory Visit | Attending: Neurosurgery | Admitting: Neurosurgery

## 2016-05-29 DIAGNOSIS — M5126 Other intervertebral disc displacement, lumbar region: Secondary | ICD-10-CM

## 2016-05-29 MED ORDER — DIAZEPAM 5 MG PO TABS
10.0000 mg | ORAL_TABLET | Freq: Once | ORAL | Status: AC
  Administered 2016-05-29: 10 mg via ORAL

## 2016-05-29 MED ORDER — ONDANSETRON HCL 4 MG/2ML IJ SOLN
4.0000 mg | Freq: Once | INTRAMUSCULAR | Status: AC
  Administered 2016-05-29: 4 mg via INTRAMUSCULAR

## 2016-05-29 MED ORDER — MEPERIDINE HCL 100 MG/ML IJ SOLN
75.0000 mg | Freq: Once | INTRAMUSCULAR | Status: AC
  Administered 2016-05-29: 75 mg via INTRAMUSCULAR

## 2016-05-29 MED ORDER — IOPAMIDOL (ISOVUE-M 200) INJECTION 41%
15.0000 mL | Freq: Once | INTRAMUSCULAR | Status: AC
  Administered 2016-05-29: 15 mL via INTRATHECAL

## 2016-05-29 NOTE — Discharge Instructions (Signed)
Myelogram Discharge Instructions  1. Go home and rest quietly for the next 24 hours.  It is important to lie flat for the next 24 hours.  Get up only to go to the restroom.  You may lie in the bed or on a couch on your back, your stomach, your left side or your right side.  You may have one pillow under your head.  You may have pillows between your knees while you are on your side or under your knees while you are on your back.  2. DO NOT drive today.  Recline the seat as far back as it will go, while still wearing your seat belt, on the way home.  3. You may get up to go to the bathroom as needed.  You may sit up for 10 minutes to eat.  You may resume your normal diet and medications unless otherwise indicated.  Drink lots of extra fluids today and tomorrow.  4. The incidence of headache, nausea, or vomiting is about 5% (one in 20 patients).  If you develop a headache, lie flat and drink plenty of fluids until the headache goes away.  Caffeinated beverages may be helpful.  If you develop severe nausea and vomiting or a headache that does not go away with flat bed rest, call [phone].  5. You may resume normal activities after your 24 hours of bed rest is over; however, do not exert yourself strongly or do any heavy lifting tomorrow. If when you get up you have a headache when standing, go back to bed and force fluids for another 24 hours.  6. Call your physician for a follow-up appointment.  The results of your myelogram will be sent directly to your physician by the following day.  7. If you have any questions or if complications develop after you arrive home, please call [phone].  Discharge instructions have been explained to the patient.  The patient, or the person responsible for the patient, fully understands these instructions.        May resume Elavil, Ultram, Olanzopine, Quetiapine and Trintillix on May 30, 2016, after 8:00 am.

## 2016-05-29 NOTE — Progress Notes (Signed)
Pt states she has been off the 5 medications since Monday, as requested.

## 2016-05-30 DIAGNOSIS — M5126 Other intervertebral disc displacement, lumbar region: Secondary | ICD-10-CM | POA: Diagnosis not present

## 2016-05-30 DIAGNOSIS — M545 Low back pain: Secondary | ICD-10-CM | POA: Diagnosis not present

## 2016-05-30 DIAGNOSIS — E784 Other hyperlipidemia: Secondary | ICD-10-CM | POA: Diagnosis not present

## 2016-05-30 DIAGNOSIS — M2550 Pain in unspecified joint: Secondary | ICD-10-CM | POA: Diagnosis not present

## 2016-05-30 DIAGNOSIS — K219 Gastro-esophageal reflux disease without esophagitis: Secondary | ICD-10-CM | POA: Diagnosis not present

## 2016-06-06 DIAGNOSIS — M2550 Pain in unspecified joint: Secondary | ICD-10-CM | POA: Diagnosis not present

## 2016-06-06 DIAGNOSIS — J45909 Unspecified asthma, uncomplicated: Secondary | ICD-10-CM | POA: Diagnosis not present

## 2016-06-06 DIAGNOSIS — E784 Other hyperlipidemia: Secondary | ICD-10-CM | POA: Diagnosis not present

## 2016-06-06 DIAGNOSIS — K219 Gastro-esophageal reflux disease without esophagitis: Secondary | ICD-10-CM | POA: Diagnosis not present

## 2016-06-13 DIAGNOSIS — M5416 Radiculopathy, lumbar region: Secondary | ICD-10-CM | POA: Diagnosis not present

## 2016-06-13 DIAGNOSIS — M5126 Other intervertebral disc displacement, lumbar region: Secondary | ICD-10-CM | POA: Diagnosis not present

## 2016-06-13 DIAGNOSIS — F1120 Opioid dependence, uncomplicated: Secondary | ICD-10-CM | POA: Diagnosis not present

## 2016-06-13 DIAGNOSIS — M47816 Spondylosis without myelopathy or radiculopathy, lumbar region: Secondary | ICD-10-CM | POA: Diagnosis not present

## 2016-06-13 IMAGING — US US ABDOMEN COMPLETE
1 series · 14 of 25 positions shown · non-contrast
Comparison: 01/01/2007

CLINICAL DATA: Liver dysfunction. Elevated liver function tests.
Prior cholecystectomy.

EXAM:
ULTRASOUND ABDOMEN COMPLETE

[Series 1: us abdomen complete · 0.20mm/px · 14 of 127 slices shown]
[im 1/127]
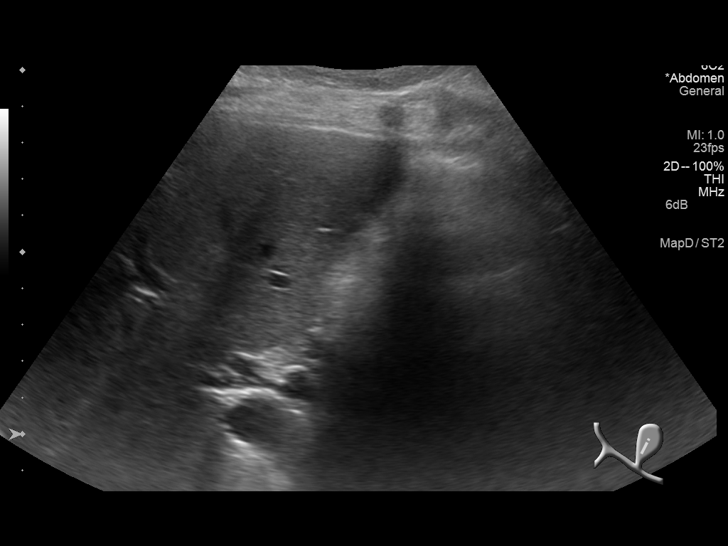
[im 11/127]
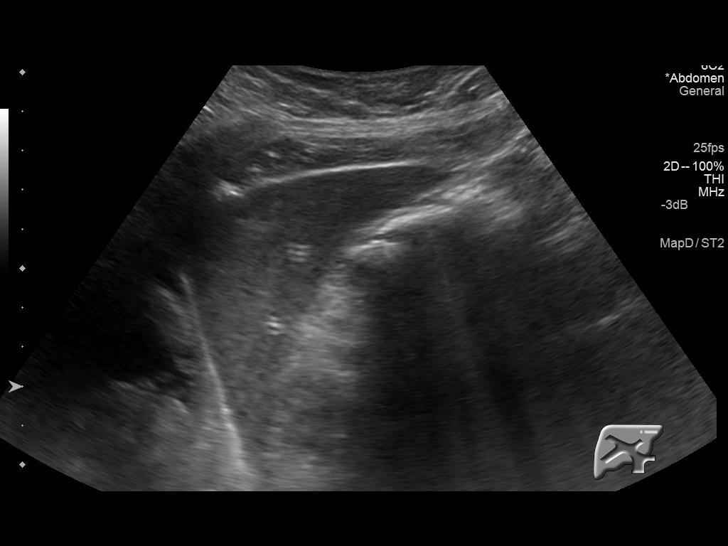
[im 22/127]
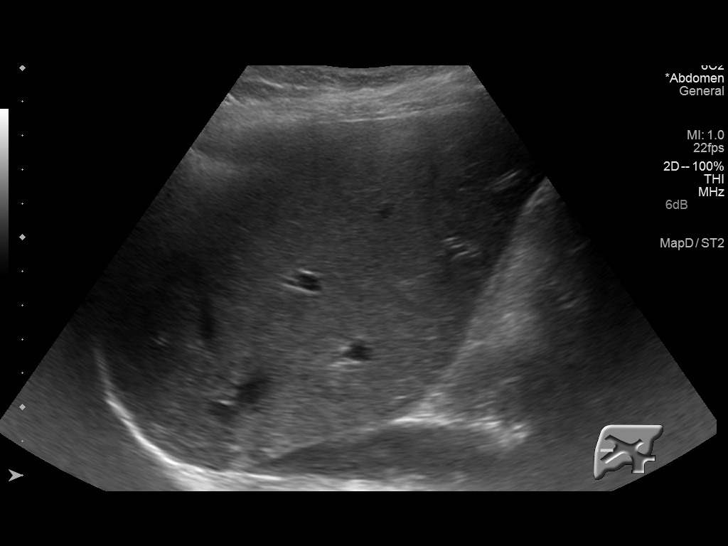
[im 32/127]
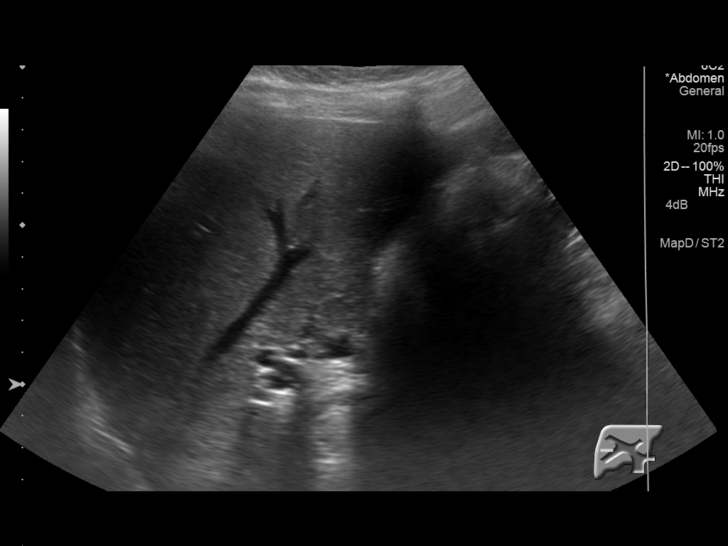
[im 43/127]
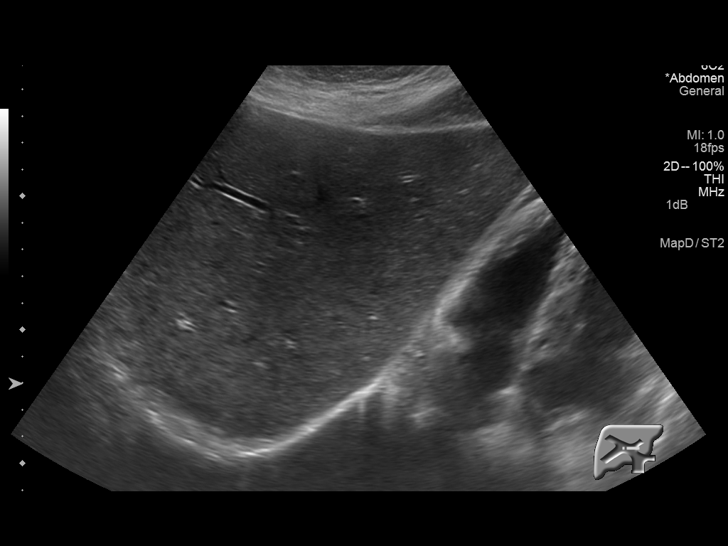
[im 48/127]
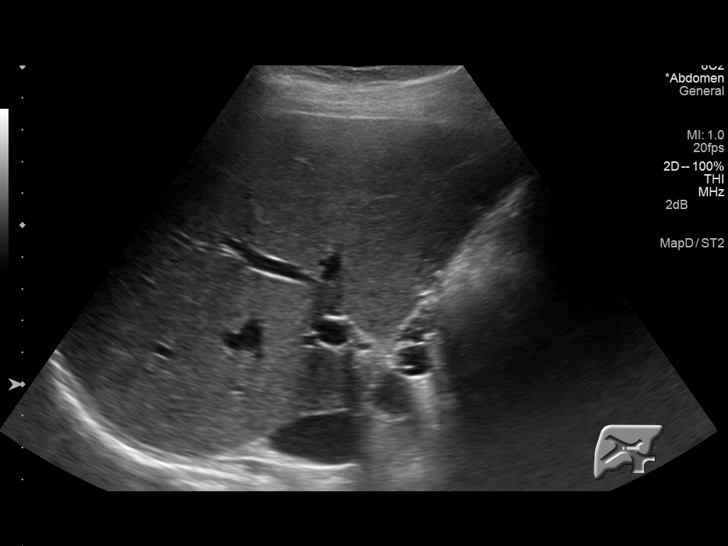
[im 58/127]
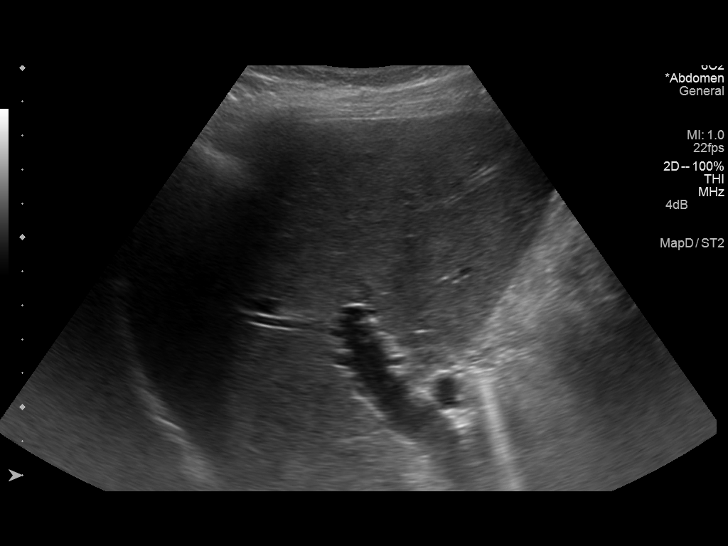
[im 69/127]
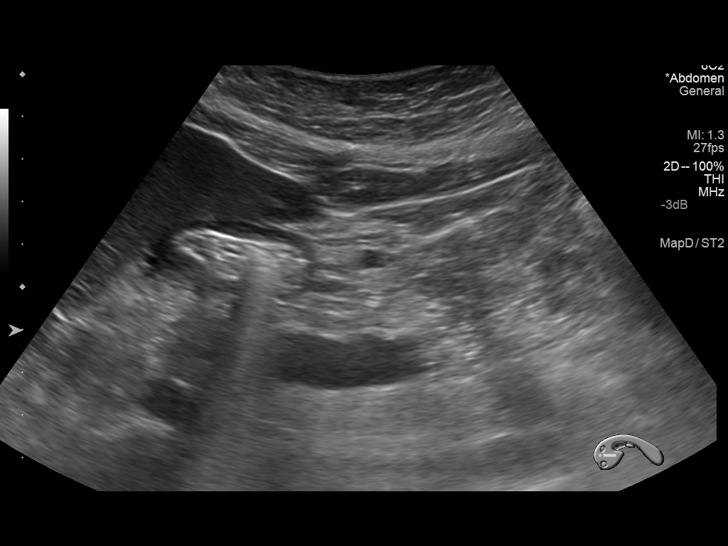
[im 79/127]
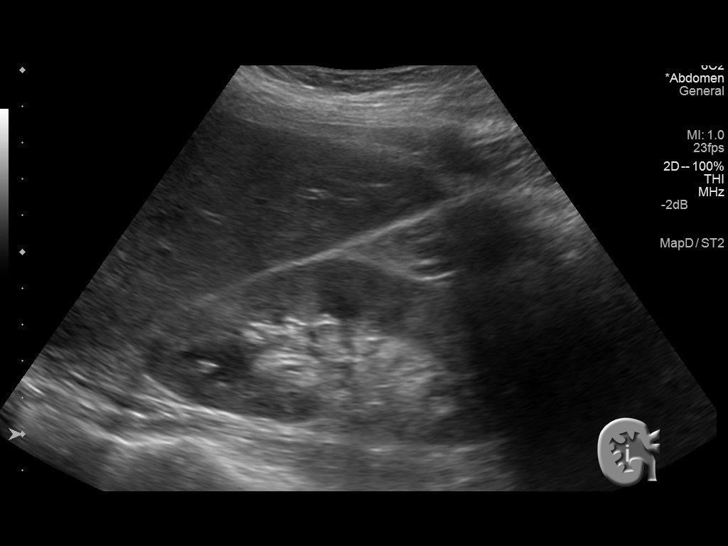
[im 85/127]
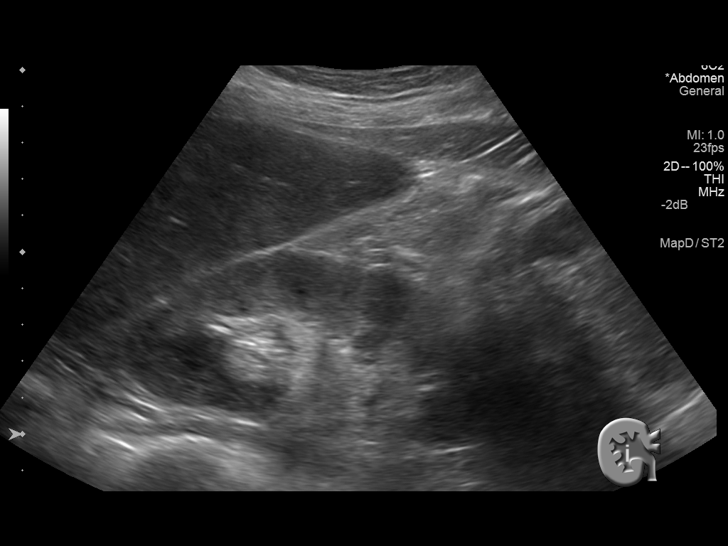
[im 95/127]
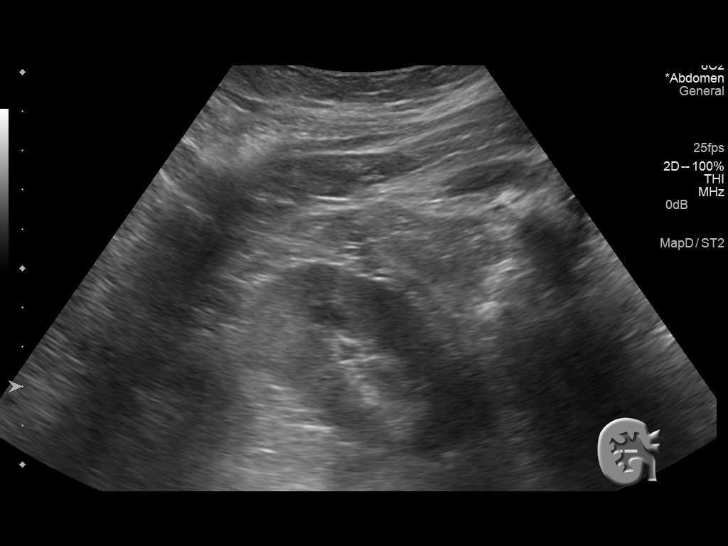
[im 106/127]
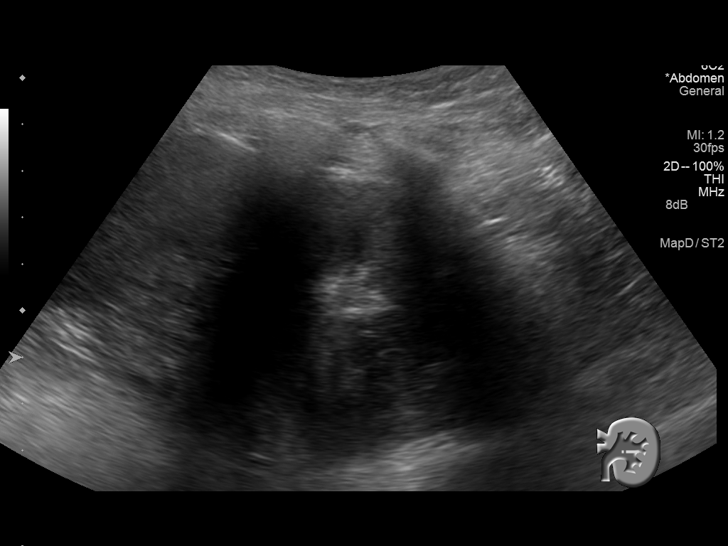
[im 116/127]
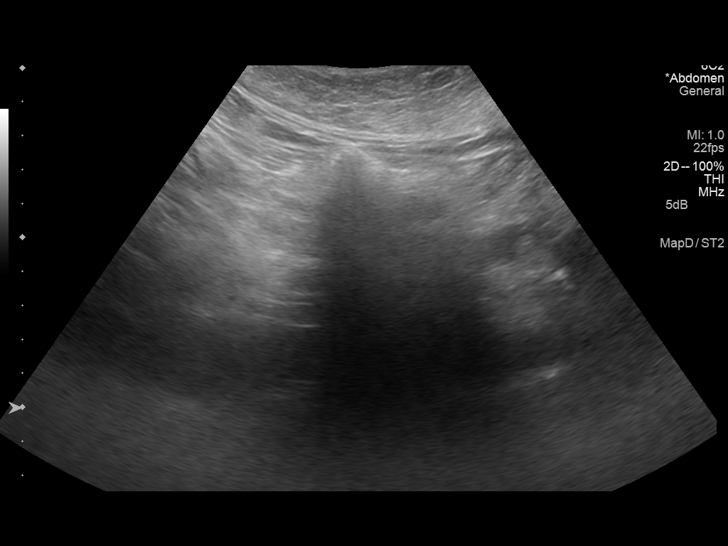
[im 127/127]
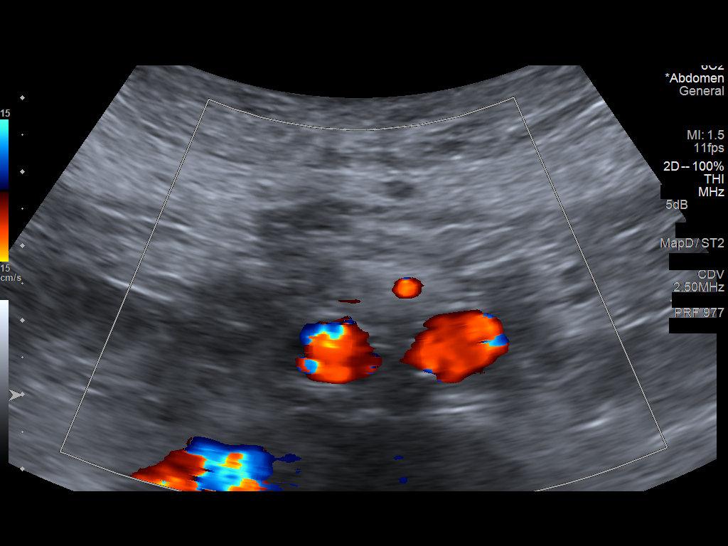

[14 of 25 positions shown; findings below may reference images not displayed]

FINDINGS: Gallbladder: Surgically absent.

Common bile duct: Diameter: 9 mm, with mild dilatation of
intrahepatic bile ducts also seen.

Liver: No focal lesion identified. Within normal limits in
parenchymal echogenicity.

IVC: No abnormality visualized.

Pancreas: Not well visualized due to overlying bowel gas.

Spleen: Size and appearance within normal limits.

Right Kidney: Length: 11.6 cm. Echogenicity within normal limits. No
mass or hydronephrosis visualized.

Left Kidney: Length: 11.8 cm. Echogenicity within normal limits. No
mass or hydronephrosis visualized.

Abdominal aorta: No aneurysm visualized.

Other findings: None.
IMPRESSION: Previous cholecystectomy. Borderline dilatation of common bile duct
measuring 9 mm and mild dilatation of intrahepatic bile ducts.
Recommend correlation with liver function tests, and consider MRCP
for further evaluation if clinically warranted.

No other significant abnormality identified.

## 2016-06-30 DIAGNOSIS — M2550 Pain in unspecified joint: Secondary | ICD-10-CM | POA: Diagnosis not present

## 2016-06-30 DIAGNOSIS — R5382 Chronic fatigue, unspecified: Secondary | ICD-10-CM | POA: Diagnosis not present

## 2016-06-30 DIAGNOSIS — M545 Low back pain: Secondary | ICD-10-CM | POA: Diagnosis not present

## 2016-06-30 IMAGING — DX DG CHEST 2V
2 series · 2 of 2 positions shown · non-contrast
Comparison: August 31, 2012.

CLINICAL DATA: Dizziness for 1 week.

EXAM:
CHEST  2 VIEW

[chest pa]
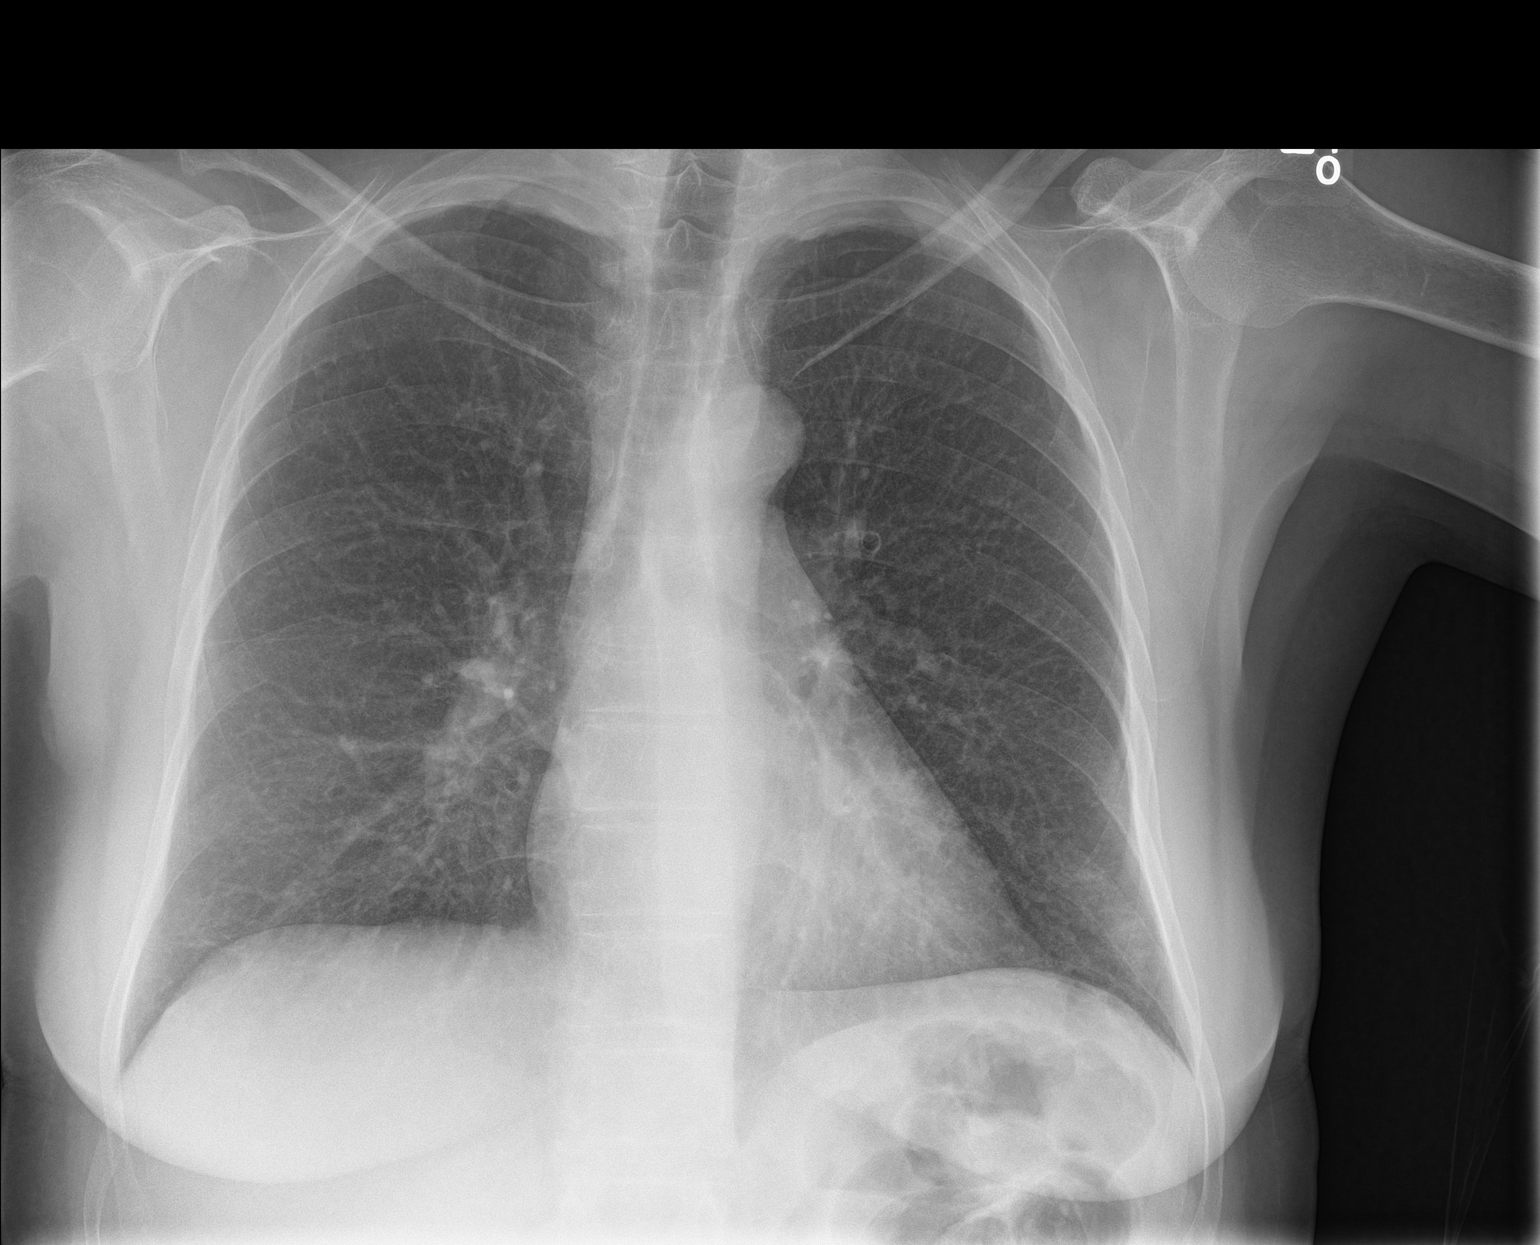

[chest lat]
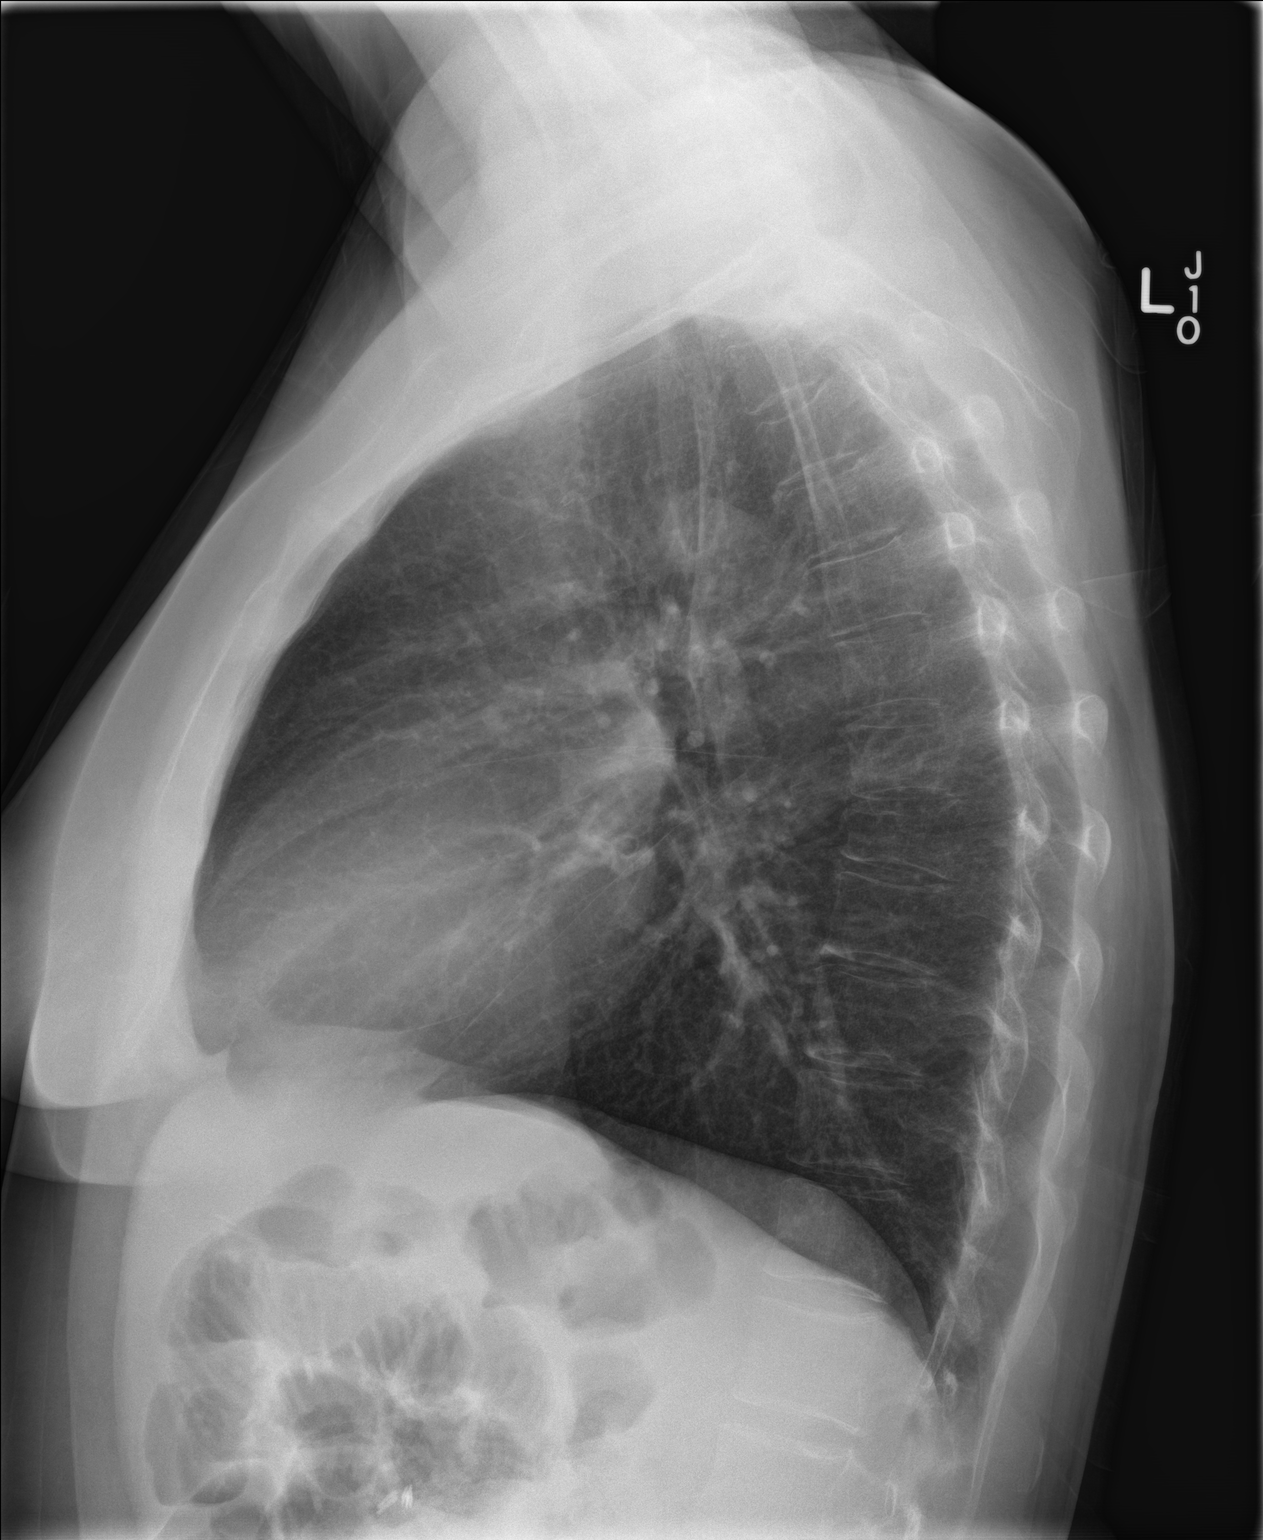

[2 of 2 positions shown; findings below may reference images not displayed]

FINDINGS: The heart size and mediastinal contours are within normal limits.
Both lungs are clear. No pneumothorax or pleural plural effusion is
noted. The visualized skeletal structures are unremarkable.
IMPRESSION: No acute cardiopulmonary abnormality seen.

## 2016-07-02 DIAGNOSIS — M47816 Spondylosis without myelopathy or radiculopathy, lumbar region: Secondary | ICD-10-CM | POA: Diagnosis not present

## 2016-07-29 DIAGNOSIS — R5382 Chronic fatigue, unspecified: Secondary | ICD-10-CM | POA: Diagnosis not present

## 2016-07-29 DIAGNOSIS — M2550 Pain in unspecified joint: Secondary | ICD-10-CM | POA: Diagnosis not present

## 2016-07-29 DIAGNOSIS — I110 Hypertensive heart disease with heart failure: Secondary | ICD-10-CM | POA: Diagnosis not present

## 2016-07-29 DIAGNOSIS — D510 Vitamin B12 deficiency anemia due to intrinsic factor deficiency: Secondary | ICD-10-CM | POA: Diagnosis not present

## 2016-07-29 DIAGNOSIS — M545 Low back pain: Secondary | ICD-10-CM | POA: Diagnosis not present

## 2016-07-29 DIAGNOSIS — E559 Vitamin D deficiency, unspecified: Secondary | ICD-10-CM | POA: Diagnosis not present

## 2016-07-29 DIAGNOSIS — R5383 Other fatigue: Secondary | ICD-10-CM | POA: Diagnosis not present

## 2016-08-04 DIAGNOSIS — M47816 Spondylosis without myelopathy or radiculopathy, lumbar region: Secondary | ICD-10-CM | POA: Diagnosis not present

## 2016-08-04 DIAGNOSIS — M5416 Radiculopathy, lumbar region: Secondary | ICD-10-CM | POA: Diagnosis not present

## 2016-08-04 DIAGNOSIS — Z79899 Other long term (current) drug therapy: Secondary | ICD-10-CM | POA: Diagnosis not present

## 2016-08-04 DIAGNOSIS — F1120 Opioid dependence, uncomplicated: Secondary | ICD-10-CM | POA: Diagnosis not present

## 2016-08-13 DIAGNOSIS — M461 Sacroiliitis, not elsewhere classified: Secondary | ICD-10-CM | POA: Diagnosis not present

## 2016-08-28 DIAGNOSIS — M2550 Pain in unspecified joint: Secondary | ICD-10-CM | POA: Diagnosis not present

## 2016-08-28 DIAGNOSIS — M545 Low back pain: Secondary | ICD-10-CM | POA: Diagnosis not present

## 2016-08-28 DIAGNOSIS — J45909 Unspecified asthma, uncomplicated: Secondary | ICD-10-CM | POA: Diagnosis not present

## 2016-08-28 DIAGNOSIS — K219 Gastro-esophageal reflux disease without esophagitis: Secondary | ICD-10-CM | POA: Diagnosis not present

## 2016-09-25 DIAGNOSIS — M545 Low back pain: Secondary | ICD-10-CM | POA: Diagnosis not present

## 2016-09-25 DIAGNOSIS — J45909 Unspecified asthma, uncomplicated: Secondary | ICD-10-CM | POA: Diagnosis not present

## 2016-09-25 DIAGNOSIS — M2550 Pain in unspecified joint: Secondary | ICD-10-CM | POA: Diagnosis not present

## 2016-09-25 DIAGNOSIS — K219 Gastro-esophageal reflux disease without esophagitis: Secondary | ICD-10-CM | POA: Diagnosis not present

## 2016-10-23 DIAGNOSIS — E7849 Other hyperlipidemia: Secondary | ICD-10-CM | POA: Diagnosis not present

## 2016-10-23 DIAGNOSIS — M2550 Pain in unspecified joint: Secondary | ICD-10-CM | POA: Diagnosis not present

## 2016-10-23 DIAGNOSIS — M545 Low back pain: Secondary | ICD-10-CM | POA: Diagnosis not present

## 2016-10-23 DIAGNOSIS — J45909 Unspecified asthma, uncomplicated: Secondary | ICD-10-CM | POA: Diagnosis not present

## 2016-11-13 DIAGNOSIS — M461 Sacroiliitis, not elsewhere classified: Secondary | ICD-10-CM | POA: Diagnosis not present

## 2016-11-13 DIAGNOSIS — M5126 Other intervertebral disc displacement, lumbar region: Secondary | ICD-10-CM | POA: Diagnosis not present

## 2016-11-13 DIAGNOSIS — M5127 Other intervertebral disc displacement, lumbosacral region: Secondary | ICD-10-CM | POA: Diagnosis not present

## 2016-11-13 DIAGNOSIS — M5416 Radiculopathy, lumbar region: Secondary | ICD-10-CM | POA: Diagnosis not present

## 2016-11-24 DIAGNOSIS — J45909 Unspecified asthma, uncomplicated: Secondary | ICD-10-CM | POA: Diagnosis not present

## 2016-11-24 DIAGNOSIS — M545 Low back pain: Secondary | ICD-10-CM | POA: Diagnosis not present

## 2016-11-24 DIAGNOSIS — G894 Chronic pain syndrome: Secondary | ICD-10-CM | POA: Diagnosis not present

## 2016-11-24 DIAGNOSIS — K219 Gastro-esophageal reflux disease without esophagitis: Secondary | ICD-10-CM | POA: Diagnosis not present

## 2016-11-28 DIAGNOSIS — Z6828 Body mass index (BMI) 28.0-28.9, adult: Secondary | ICD-10-CM | POA: Diagnosis not present

## 2016-11-28 DIAGNOSIS — M47816 Spondylosis without myelopathy or radiculopathy, lumbar region: Secondary | ICD-10-CM | POA: Diagnosis not present

## 2016-11-28 DIAGNOSIS — I10 Essential (primary) hypertension: Secondary | ICD-10-CM | POA: Diagnosis not present

## 2016-12-24 DIAGNOSIS — M545 Low back pain: Secondary | ICD-10-CM | POA: Diagnosis not present

## 2016-12-24 DIAGNOSIS — J45909 Unspecified asthma, uncomplicated: Secondary | ICD-10-CM | POA: Diagnosis not present

## 2016-12-24 DIAGNOSIS — K219 Gastro-esophageal reflux disease without esophagitis: Secondary | ICD-10-CM | POA: Diagnosis not present

## 2016-12-24 DIAGNOSIS — M2550 Pain in unspecified joint: Secondary | ICD-10-CM | POA: Diagnosis not present

## 2017-01-23 DIAGNOSIS — M2550 Pain in unspecified joint: Secondary | ICD-10-CM | POA: Diagnosis not present

## 2017-01-23 DIAGNOSIS — J45909 Unspecified asthma, uncomplicated: Secondary | ICD-10-CM | POA: Diagnosis not present

## 2017-01-23 DIAGNOSIS — M545 Low back pain: Secondary | ICD-10-CM | POA: Diagnosis not present

## 2017-01-23 DIAGNOSIS — E7849 Other hyperlipidemia: Secondary | ICD-10-CM | POA: Diagnosis not present

## 2017-01-31 ENCOUNTER — Emergency Department (HOSPITAL_COMMUNITY): Payer: Medicare HMO

## 2017-01-31 ENCOUNTER — Emergency Department (HOSPITAL_COMMUNITY)
Admission: EM | Admit: 2017-01-31 | Discharge: 2017-01-31 | Disposition: A | Discharge status: Home / Self Care | Payer: Medicare HMO | Attending: Emergency Medicine | Admitting: Emergency Medicine

## 2017-01-31 ENCOUNTER — Encounter (HOSPITAL_COMMUNITY): Payer: Self-pay | Admitting: *Deleted

## 2017-01-31 DIAGNOSIS — Z7982 Long term (current) use of aspirin: Secondary | ICD-10-CM | POA: Insufficient documentation

## 2017-01-31 DIAGNOSIS — J449 Chronic obstructive pulmonary disease, unspecified: Secondary | ICD-10-CM | POA: Diagnosis not present

## 2017-01-31 DIAGNOSIS — Z79899 Other long term (current) drug therapy: Secondary | ICD-10-CM | POA: Insufficient documentation

## 2017-01-31 DIAGNOSIS — F17210 Nicotine dependence, cigarettes, uncomplicated: Secondary | ICD-10-CM | POA: Insufficient documentation

## 2017-01-31 DIAGNOSIS — R05 Cough: Secondary | ICD-10-CM | POA: Diagnosis not present

## 2017-01-31 DIAGNOSIS — R0602 Shortness of breath: Secondary | ICD-10-CM | POA: Diagnosis not present

## 2017-01-31 DIAGNOSIS — J40 Bronchitis, not specified as acute or chronic: Secondary | ICD-10-CM | POA: Insufficient documentation

## 2017-01-31 DIAGNOSIS — E119 Type 2 diabetes mellitus without complications: Secondary | ICD-10-CM | POA: Diagnosis not present

## 2017-01-31 DIAGNOSIS — I10 Essential (primary) hypertension: Secondary | ICD-10-CM | POA: Insufficient documentation

## 2017-01-31 MED ORDER — IPRATROPIUM-ALBUTEROL 0.5-2.5 (3) MG/3ML IN SOLN
3.0000 mL | Freq: Once | RESPIRATORY_TRACT | Status: AC
  Administered 2017-01-31: 3 mL via RESPIRATORY_TRACT
  Filled 2017-01-31: qty 3

## 2017-01-31 MED ORDER — PREDNISONE 50 MG PO TABS
60.0000 mg | ORAL_TABLET | Freq: Once | ORAL | Status: AC
  Administered 2017-01-31: 60 mg via ORAL
  Filled 2017-01-31: qty 1

## 2017-01-31 MED ORDER — ONDANSETRON HCL 4 MG PO TABS: 4.0000 mg | ORAL_TABLET | Freq: Four times a day (QID) | ORAL | 0 refills | Status: DC

## 2017-01-31 MED ORDER — ONDANSETRON 4 MG PO TBDP
4.0000 mg | ORAL_TABLET | Freq: Once | ORAL | Status: AC
  Administered 2017-01-31: 4 mg via ORAL
  Filled 2017-01-31: qty 1

## 2017-01-31 MED ORDER — ALBUTEROL SULFATE HFA 108 (90 BASE) MCG/ACT IN AERS
1.0000 | INHALATION_SPRAY | Freq: Four times a day (QID) | RESPIRATORY_TRACT | Status: DC | PRN
  Administered 2017-01-31: 2 via RESPIRATORY_TRACT
  Filled 2017-01-31: qty 6.7

## 2017-01-31 MED ORDER — PREDNISONE 50 MG PO TABS
50.0000 mg | ORAL_TABLET | Freq: Every day | ORAL | 0 refills | Status: DC
Start: 2017-01-31 — End: 2017-05-31

## 2017-01-31 MED ORDER — PSEUDOEPHEDRINE HCL ER 120 MG PO TB12: 120.0000 mg | ORAL_TABLET | Freq: Two times a day (BID) | ORAL | 0 refills | Status: DC

## 2017-01-31 NOTE — ED Provider Notes (Signed)
[facility] EMERGENCY DEPARTMENT Provider Note   CSN: [id number] Arrival date & time: 01/31/17  1334     History   Chief Complaint Chief Complaint  Patient presents with  . Shortness of Breath    HPI Tanya Velazquez is a 57 y.o. female.  HPI Patient presented to the emergency room for evaluation of cough, and congestion that started about 2 weeks ago.  She has had nasal congestion as well as coughing.  She has had a couple episodes of vomiting and diarrhea but not persistent.  Patient does have a history of COPD.  She continues to smoke.  She thinks she has been wheezing occasionally.  She denies any fevers.  No trouble with chest pain.  No abdominal pain.  No definite fevers. Past Medical History:  Diagnosis Date  . Anxiety   . B12 deficiency anemia   . Chronic constipation   . Chronic constipation   . Chronic low back pain    lumbar radiculopathy  . COPD (chronic obstructive pulmonary disease) ([place])   . Diabetes mellitus   . DJD (degenerative joint disease)   . GERD (gastroesophageal reflux disease) 04/13/07   EGD Dr Rourk->patulous EG junction, HH, antral erosions  . H/O hiatal hernia   . Hyperplastic colon polyp 06/27/2010  . Hypertension   . Migraines   . Osteoarthritis   . Raspy voice   . Smokers' cough ([place])   . SUI (stress urinary incontinence, female)     Patient Active Problem List   Diagnosis Date Noted  . RUQ pain 04/10/2014  . Lower abdominal pain 04/10/2014  . Abnormal LFTs 04/10/2014  . Intrahepatic bile duct dilation 04/10/2014  . Common bile duct dilatation 04/10/2014  . Constipation 06/11/2011  . History of gastroesophageal reflux (GERD) 06/20/2010  . Family history of colon cancer 06/20/2010    Past Surgical History:  Procedure Laterality Date  . APPENDECTOMY    . BLADDER SUSPENSION  10/07/2011   Procedure: [facility] PROCEDURE;  Surgeon: Malka So, MD;  [facility];  Service: Urology;  Laterality: N/A;  1 hour  requested for this case   . CARDIAC CATHETERIZATION  08-09-2004 DR Einar Gip   NORMAL CORONARY ARTERIES/  NORMAL LVF  . CARDIAC CATHETERIZATION  2006   normal coronary arteries  . COLONOSCOPY  3/31/9   Dr Rourk->friable anal canal  . COLONOSCOPY  0 06/27/10   Dr. Gala Romney hyperplastic polyp, prominent external hemorrhoid plexus. next tcs 06/2015.  . CYSTOSCOPY  10/07/2011   Procedure: CYSTOSCOPY;  Surgeon: Malka So, MD;  Location: [facility];  Service: Urology;  Laterality: N/A;  . ESOPHAGEAL MANOMETRY  2013   Dr. Derrill Kay at [facility]. incomplete bolus clearance with some breaks in contractions but there was peristalsis  . ESOPHAGOGASTRODUODENOSCOPY  06/27/10   Hiatal hernia, GERD,  . EXCISION VOLAR GANGLION LEFT WRIST  06-27-2005  . HEAD-UP TILT TABLE TEST  01-21-2007 &  11-04-2004  DR Rollene Fare   HX CHRONIC RECURRENT SYNCOPAL. EPICODES--  NEGATIVE RESULT INCLUDING ISUPREL INFUSION  . HEMORRHOID SURGERY  09-13-2003  . LAPAROSCOPIC CHOLECYSTECTOMY  02-23-2007  . LAPAROTOMY W/ APPENDCTOMY AND LEFT SALPINGO-OOPHECTOMY  AGE 11'S  . pH/impedence  2013   Dr. Derrill Kay at [facility]. Increased reflux off PPI with excellent correlation between reflux events and regurgitation. Patient advised to hold PPI for study by Dr. Derrill Kay.   Marland Kitchen RIGHT SHOULDER ARTHROSCOPY/ DISTAL CLAVICLE RESECTION / DEBRIDEMENT ROTATOR CUFF AND LABRUM TEAR/ BURSECTOMY  06-28-2010   IMPINGEMENT SYNDROME/ AC  JOINT ARTHRITIS/ ROTATOR CUFF TENDINOPATHY  . SHOULDER ARTHROSCOPY  08-13-2010   LEFT SHOULDER IMPINGEMENT SYNDROME/ DJD OF AC JOINT  . STOMACH SURGERY  2013   antireflux surgery at [place]  age 38's    OB History    No data available       Home Medications    Prior to Admission medications   Medication Sig Start Date End Date Taking? Authorizing Provider  aspirin EC 81 MG tablet Take 81 mg by mouth every other day.    Yes [provider]  cholecalciferol (VITAMIN D) 1000 UNITS  tablet Take 1,000 Units by mouth daily.   Yes [provider]  cyanocobalamin (,VITAMIN B-12,) 1000 MCG/ML injection Inject 1,000 mcg into the muscle every 30 (thirty) days.    Yes [provider]  cyclobenzaprine (FLEXERIL) 10 MG tablet Take 1 tablet (10 mg total) by mouth 3 (three) times daily. 12/15/15  Yes Lily Kocher, PA-C  dexlansoprazole (DEXILANT) 60 MG capsule Take 1 capsule (60 mg total) by mouth 2 (two) times daily before a meal. Patient taking differently: Take 60 mg by mouth daily.  08/19/12  Yes Annitta Needs, NP  gabapentin (NEURONTIN) 300 MG capsule Take 300 mg by mouth 3 (three) times daily.   Yes [provider]  hydrOXYzine (VISTARIL) 25 MG capsule Take 1 capsule by mouth 3 (three) times daily. 11/10/16  Yes [provider]  ibuprofen (ADVIL,MOTRIN) 200 MG tablet Take 400 mg by mouth every 6 (six) hours as needed for pain.    Yes [provider]  lisinopril (PRINIVIL,ZESTRIL) 20 MG tablet Take 1 tablet by mouth daily. 01/23/17  Yes [provider]  LORazepam (ATIVAN) 1 MG tablet Take 1 tablet by mouth daily. 01/23/17  Yes [provider]  OLANZapine (ZYPREXA) 5 MG tablet Take 5 mg by mouth at bedtime.   Yes [provider]  Oxycodone HCl 10 MG TABS Take 10 mg by mouth 4 (four) times daily. 1 01/23/17  Yes [provider]  pravastatin (PRAVACHOL) 20 MG tablet Take 1 tablet by mouth daily. 12/17/12  Yes [provider]  PROAIR HFA 108 (90 BASE) MCG/ACT inhaler Inhale 1 puff into the lungs every 4 (four) hours as needed.  05/13/10  Yes [provider]  QUEtiapine (SEROQUEL) 25 MG tablet Take 25 mg by mouth at bedtime.   Yes [provider]  traMADol (ULTRAM) 50 MG tablet Take 1 tablet (50 mg total) by mouth every 6 (six) hours as needed. 07/01/14  Yes Triplett, Tammy, PA-C  vortioxetine HBr (TRINTELLIX) 10 MG TABS Take 10 mg by mouth daily.    Yes [provider]    ondansetron (ZOFRAN) 4 MG tablet Take 1 tablet (4 mg total) by mouth every 6 (six) hours. 01/31/17   Dorie Rank, MD  predniSONE (DELTASONE) 50 MG tablet Take 1 tablet (50 mg total) by mouth daily. 01/31/17   Dorie Rank, MD  pseudoephedrine (SUDAFED 12 HOUR) 120 MG 12 hr tablet Take 1 tablet (120 mg total) by mouth every 12 (twelve) hours. 01/31/17   Dorie Rank, MD    Family History Family History  Problem Relation Age of Onset  . Colon cancer Mother 63  . Hypertension Mother   . Alzheimer's disease Father   . Hypertension Father   . Healthy Daughter     Social History Social History   Tobacco Use  . Smoking status: Current Every Day Smoker    Packs/day: 1.00  Years: 35.00    Pack years: 35.00    Types: Cigarettes  . Smokeless tobacco: Never Used  Substance Use Topics  . Alcohol use: No    Alcohol/week: 0.0 oz  . Drug use: No     Allergies   Sulfa antibiotics; Amicinonide-benzyl alcohol [cyclocort]; Amoxicillin; Doxycycline; Paxil [paroxetine hcl]; Penicillins; and Trimethoprim   Review of Systems Review of Systems  All other systems reviewed and are negative.    Physical Exam Updated Vital Signs BP 108/63 (BP Location: Left Arm)   Pulse 91   Temp 98.2 F (36.8 C) (Oral)   Resp 17   Ht 1.575 m (5\' 2" )   Wt 63.5 kg (140 lb)   SpO2 98%   BMI 25.61 kg/m   Physical Exam  Constitutional: No distress.  HENT:  Head: Normocephalic and atraumatic.  Right Ear: External ear normal.  Left Ear: External ear normal.  Nasal congestion  Eyes: Conjunctivae are normal. Right eye exhibits no discharge. Left eye exhibits no discharge. No scleral icterus.  Neck: Neck supple. No tracheal deviation present.  Cardiovascular: Normal rate, regular rhythm and intact distal pulses.  Pulmonary/Chest: Effort normal and breath sounds normal. No stridor. No respiratory distress. She has no decreased breath sounds. She has no wheezes. She has no rales.  Abdominal: Soft. Bowel sounds  are normal. She exhibits no distension. There is no tenderness. There is no rebound and no guarding.  Musculoskeletal: She exhibits no edema or tenderness.  Neurological: She is alert. She has normal strength. No cranial nerve deficit (no facial droop, extraocular movements intact, no slurred speech) or sensory deficit. She exhibits normal muscle tone. She displays no seizure activity. Coordination normal.  Skin: Skin is warm and dry. No rash noted.  Psychiatric: She has a normal mood and affect.  Nursing note and vitals reviewed.    ED Treatments / Results    EKG Interpretation  Date/Time:  Saturday January 31 2017 15:25:21 EST Ventricular Rate:  74 PR Interval:    QRS Duration: 91 QT Interval:  399 QTC Calculation: 443 R Axis:   26 Text Interpretation:  Sinus rhythm Low voltage, extremity and precordial leads No significant change since last tracing Confirmed by Dorie Rank [id number]) on 01/31/2017 3:28:09 PM        Radiology Dg Chest 2 View  Result Date: 01/31/2017 CLINICAL DATA:  Shortness of breath and cough. EXAM: CHEST  2 VIEW COMPARISON:  08/12/2015 FINDINGS: Cardiomediastinal silhouette is normal. Mediastinal contours appear intact. Tortuosity of the aorta. There is no evidence of focal airspace consolidation, pleural effusion or pneumothorax. Osseous structures are without acute abnormality. Soft tissues are grossly normal. IMPRESSION: No active cardiopulmonary disease. Electronically Signed   By: Fidela Salisbury M.D.   On: 01/31/2017 14:35    Procedures Procedures (including critical care time)  Medications Ordered in ED Medications  ipratropium-albuterol (DUONEB) 0.5-2.5 (3) MG/3ML nebulizer solution 3 mL (3 mLs Nebulization Given 01/31/17 1513)  predniSONE (DELTASONE) tablet 60 mg (60 mg Oral Given 01/31/17 1441)  ondansetron (ZOFRAN-ODT) disintegrating tablet 4 mg (4 mg Oral Given 01/31/17 1508)     Initial Impression / Assessment and Plan / ED Course  I have  reviewed the triage vital signs and the nursing notes.  Pertinent labs & imaging results that were available during my care of the patient were reviewed by me and considered in my medical decision making (see chart for details).   Patient presented to the emergency room with complaints of cough congestion that started a  couple weeks ago.  CXR without PNA.  Suspect bronchitis, copd, viral etiology.  Doubt cardiac, chf..  Will dc home with oral steroids and pt was given an inhlaer in the ED.  Final Clinical Impressions(s) / ED Diagnoses   Final diagnoses:  Bronchitis    ED Discharge Orders        Ordered    ondansetron (ZOFRAN) 4 MG tablet  Every 6 hours     01/31/17 1511    pseudoephedrine (SUDAFED 12 HOUR) 120 MG 12 hr tablet  Every 12 hours     01/31/17 1511    predniSONE (DELTASONE) 50 MG tablet  Daily     01/31/17 1511       Dorie Rank, MD 01/31/17 2135

## 2017-01-31 NOTE — ED Triage Notes (Signed)
Pt with N/V/D and cough for 2 weeks per pt. Pt c/o chest tightness to mid chest.

## 2017-01-31 NOTE — Discharge Instructions (Signed)
Take the medications as prescribed, follow-up with a primary care doctor next week to make sure you are improving

## 2017-01-31 NOTE — ED Notes (Signed)
EDP at bedside  

## 2017-02-23 DIAGNOSIS — M2550 Pain in unspecified joint: Secondary | ICD-10-CM | POA: Diagnosis not present

## 2017-02-23 DIAGNOSIS — M545 Low back pain: Secondary | ICD-10-CM | POA: Diagnosis not present

## 2017-02-23 DIAGNOSIS — F418 Other specified anxiety disorders: Secondary | ICD-10-CM | POA: Diagnosis not present

## 2017-02-23 DIAGNOSIS — R5382 Chronic fatigue, unspecified: Secondary | ICD-10-CM | POA: Diagnosis not present

## 2017-03-23 DIAGNOSIS — K219 Gastro-esophageal reflux disease without esophagitis: Secondary | ICD-10-CM | POA: Diagnosis not present

## 2017-03-23 DIAGNOSIS — M2550 Pain in unspecified joint: Secondary | ICD-10-CM | POA: Diagnosis not present

## 2017-03-23 DIAGNOSIS — M545 Low back pain: Secondary | ICD-10-CM | POA: Diagnosis not present

## 2017-03-23 DIAGNOSIS — E7849 Other hyperlipidemia: Secondary | ICD-10-CM | POA: Diagnosis not present

## 2017-05-21 DIAGNOSIS — E7849 Other hyperlipidemia: Secondary | ICD-10-CM | POA: Diagnosis not present

## 2017-05-21 DIAGNOSIS — M2550 Pain in unspecified joint: Secondary | ICD-10-CM | POA: Diagnosis not present

## 2017-05-21 DIAGNOSIS — M545 Low back pain: Secondary | ICD-10-CM | POA: Diagnosis not present

## 2017-05-21 DIAGNOSIS — K219 Gastro-esophageal reflux disease without esophagitis: Secondary | ICD-10-CM | POA: Diagnosis not present

## 2017-05-31 ENCOUNTER — Emergency Department (HOSPITAL_COMMUNITY)
Admission: EM | Admit: 2017-05-31 | Discharge: 2017-05-31 | Disposition: A | Discharge status: Home / Self Care | Payer: Medicare HMO | Attending: Emergency Medicine | Admitting: Emergency Medicine

## 2017-05-31 ENCOUNTER — Encounter (HOSPITAL_COMMUNITY): Payer: Self-pay | Admitting: Emergency Medicine

## 2017-05-31 ENCOUNTER — Emergency Department (HOSPITAL_COMMUNITY): Payer: Medicare HMO

## 2017-05-31 ENCOUNTER — Other Ambulatory Visit: Payer: Self-pay

## 2017-05-31 DIAGNOSIS — Z7902 Long term (current) use of antithrombotics/antiplatelets: Secondary | ICD-10-CM | POA: Diagnosis not present

## 2017-05-31 DIAGNOSIS — W182XXA Fall in (into) shower or empty bathtub, initial encounter: Secondary | ICD-10-CM | POA: Diagnosis not present

## 2017-05-31 DIAGNOSIS — Y998 Other external cause status: Secondary | ICD-10-CM | POA: Diagnosis not present

## 2017-05-31 DIAGNOSIS — W19XXXA Unspecified fall, initial encounter: Secondary | ICD-10-CM

## 2017-05-31 DIAGNOSIS — Y92012 Bathroom of single-family (private) house as the place of occurrence of the external cause: Secondary | ICD-10-CM | POA: Diagnosis not present

## 2017-05-31 DIAGNOSIS — E119 Type 2 diabetes mellitus without complications: Secondary | ICD-10-CM | POA: Diagnosis not present

## 2017-05-31 DIAGNOSIS — S79912A Unspecified injury of left hip, initial encounter: Secondary | ICD-10-CM | POA: Diagnosis not present

## 2017-05-31 DIAGNOSIS — I10 Essential (primary) hypertension: Secondary | ICD-10-CM | POA: Insufficient documentation

## 2017-05-31 DIAGNOSIS — M545 Low back pain, unspecified: Secondary | ICD-10-CM

## 2017-05-31 DIAGNOSIS — F17210 Nicotine dependence, cigarettes, uncomplicated: Secondary | ICD-10-CM | POA: Diagnosis not present

## 2017-05-31 DIAGNOSIS — Z79899 Other long term (current) drug therapy: Secondary | ICD-10-CM | POA: Insufficient documentation

## 2017-05-31 DIAGNOSIS — J449 Chronic obstructive pulmonary disease, unspecified: Secondary | ICD-10-CM | POA: Diagnosis not present

## 2017-05-31 DIAGNOSIS — Y93E1 Activity, personal bathing and showering: Secondary | ICD-10-CM | POA: Diagnosis not present

## 2017-05-31 DIAGNOSIS — M25552 Pain in left hip: Secondary | ICD-10-CM | POA: Diagnosis not present

## 2017-05-31 MED ORDER — NAPROXEN 500 MG PO TABS: 500.0000 mg | ORAL_TABLET | Freq: Two times a day (BID) | ORAL | 0 refills | Status: DC

## 2017-05-31 MED ORDER — CYCLOBENZAPRINE HCL 10 MG PO TABS: 10.0000 mg | ORAL_TABLET | Freq: Two times a day (BID) | ORAL | 0 refills | Status: DC | PRN

## 2017-05-31 NOTE — Discharge Instructions (Addendum)
X-rays were good.  You will be sore for several days.  Prescription for muscle relaxer and anti-inflammatory medicine.

## 2017-05-31 NOTE — ED Provider Notes (Signed)
[facility] EMERGENCY DEPARTMENT Provider Note   CSN: [id number] Arrival date & time: 05/31/17  1043     History   Chief Complaint Chief Complaint  Patient presents with  . Fall    HPI Tanya Velazquez is a 57 y.o. female.  Complains of left lower back pain after slipping and falling in the shower yesterday.  She apparently hit her head but there is no scalp hematoma or history of loss of consciousness.  Patient reports no dysuria/hematuria to me.  Severity of pain is mild to moderate.  Palpation and ambulation make pain worse.     Past Medical History:  Diagnosis Date  . Anxiety   . B12 deficiency anemia   . Chronic constipation   . Chronic constipation   . Chronic low back pain    lumbar radiculopathy  . COPD (chronic obstructive pulmonary disease) ([place])   . Diabetes mellitus   . DJD (degenerative joint disease)   . GERD (gastroesophageal reflux disease) 04/13/07   EGD Dr Rourk->patulous EG junction, HH, antral erosions  . H/O hiatal hernia   . Hyperplastic colon polyp 06/27/2010  . Hypertension   . Migraines   . Osteoarthritis   . Raspy voice   . Smokers' cough ([place])   . SUI (stress urinary incontinence, female)     Patient Active Problem List   Diagnosis Date Noted  . RUQ pain 04/10/2014  . Lower abdominal pain 04/10/2014  . Abnormal LFTs 04/10/2014  . Intrahepatic bile duct dilation 04/10/2014  . Common bile duct dilatation 04/10/2014  . Constipation 06/11/2011  . History of gastroesophageal reflux (GERD) 06/20/2010  . Family history of colon cancer 06/20/2010    Past Surgical History:  Procedure Laterality Date  . APPENDECTOMY    . BLADDER SUSPENSION  10/07/2011   Procedure: [facility] PROCEDURE;  Surgeon: Malka So, MD;  Location: [facility];  Service: Urology;  Laterality: N/A;  1 hour requested for this case   . CARDIAC CATHETERIZATION  08-09-2004 DR Einar Gip   NORMAL CORONARY ARTERIES/  NORMAL LVF  . CARDIAC CATHETERIZATION  2006   normal coronary arteries  . COLONOSCOPY  3/31/9   Dr Rourk->friable anal canal  . COLONOSCOPY  0 06/27/10   Dr. Gala Romney hyperplastic polyp, prominent external hemorrhoid plexus. next tcs 06/2015.  . CYSTOSCOPY  10/07/2011   Procedure: CYSTOSCOPY;  Surgeon: Malka So, MD;  Location: [facility];  Service: Urology;  Laterality: N/A;  . ESOPHAGEAL MANOMETRY  2013   Dr. Derrill [facility] at [facility]. incomplete bolus clearance with some breaks in contractions but there was peristalsis  . ESOPHAGOGASTRODUODENOSCOPY  06/27/10   Hiatal hernia, GERD,  . EXCISION VOLAR GANGLION LEFT WRIST  06-27-2005  . HEAD-UP TILT TABLE TEST  01-21-2007 &  11-04-2004  DR Rollene Fare   HX CHRONIC RECURRENT SYNCOPAL. EPICODES--  NEGATIVE RESULT INCLUDING ISUPREL INFUSION  . HEMORRHOID SURGERY  09-13-2003  . LAPAROSCOPIC CHOLECYSTECTOMY  02-23-2007  . LAPAROTOMY W/ APPENDCTOMY AND LEFT SALPINGO-OOPHECTOMY  AGE 26'S  . pH/impedence  2013   Dr. Derrill Kay at [facility]. Increased reflux off PPI with excellent correlation between reflux events and regurgitation. Patient advised to hold PPI for study by Dr. Derrill Kay.   Marland Kitchen RIGHT SHOULDER ARTHROSCOPY/ DISTAL CLAVICLE RESECTION / DEBRIDEMENT ROTATOR CUFF AND LABRUM TEAR/ BURSECTOMY  06-28-2010   IMPINGEMENT SYNDROME/ AC JOINT ARTHRITIS/ ROTATOR CUFF TENDINOPATHY  . SHOULDER ARTHROSCOPY  08-13-2010   LEFT SHOULDER IMPINGEMENT SYNDROME/ DJD OF AC JOINT  . STOMACH SURGERY  2013  antireflux surgery at [place]  age 10's     OB History   None      Home Medications    Prior to Admission medications   Medication Sig Start Date End Date Taking? Authorizing Provider  aspirin EC 81 MG tablet Take 81 mg by mouth every other day.    Yes [provider]  cholecalciferol (VITAMIN D) 1000 UNITS tablet Take 1,000 Units by mouth daily.   Yes [provider]  dexlansoprazole (DEXILANT) 60 MG capsule Take 1 capsule (60 mg total) by mouth 2 (two) times  daily before a meal. Patient taking differently: Take 60 mg by mouth daily.  08/19/12  Yes Annitta Needs, NP  gabapentin (NEURONTIN) 300 MG capsule Take 300 mg by mouth 3 (three) times daily.   Yes [provider]  hydrOXYzine (VISTARIL) 25 MG capsule Take 1 capsule by mouth 3 (three) times daily. 11/10/16  Yes [provider]  ibuprofen (ADVIL,MOTRIN) 200 MG tablet Take 400 mg by mouth every 6 (six) hours as needed for pain.    Yes [provider]  lisinopril (PRINIVIL,ZESTRIL) 20 MG tablet Take 1 tablet by mouth daily. 01/23/17  Yes [provider]  LORazepam (ATIVAN) 1 MG tablet Take 1 tablet by mouth daily as needed.  01/23/17  Yes [provider]  OLANZapine (ZYPREXA) 5 MG tablet Take 5 mg by mouth at bedtime.   Yes [provider]  Oxycodone HCl 10 MG TABS Take 5 mg by mouth 3 (three) times daily. 1 01/23/17  Yes [provider]  pravastatin (PRAVACHOL) 20 MG tablet Take 1 tablet by mouth daily. 12/17/12  Yes [provider]  PROAIR HFA 108 (90 BASE) MCG/ACT inhaler Inhale 1 puff into the lungs every 4 (four) hours as needed.  05/13/10  Yes [provider]  QUEtiapine (SEROQUEL) 25 MG tablet Take 25 mg by mouth at bedtime.   Yes [provider]  cyclobenzaprine (FLEXERIL) 10 MG tablet Take 1 tablet (10 mg total) by mouth 2 (two) times daily as needed for muscle spasms. 05/31/17   Nat Christen, MD  naproxen (NAPROSYN) 500 MG tablet Take 1 tablet (500 mg total) by mouth 2 (two) times daily. 05/31/17   Nat Christen, MD  ondansetron (ZOFRAN) 4 MG tablet Take 1 tablet (4 mg total) by mouth every 6 (six) hours. Patient not taking: Reported on 05/31/2017 01/31/17   Dorie Rank, MD  pseudoephedrine (SUDAFED 12 HOUR) 120 MG 12 hr tablet Take 1 tablet (120 mg total) by mouth every 12 (twelve) hours. Patient not taking: Reported on 05/31/2017 01/31/17   Dorie Rank, MD    Family History Family History  Problem Relation Age of  Onset  . Colon cancer Mother 31  . Hypertension Mother   . Alzheimer's disease Father   . Hypertension Father   . Healthy Daughter     Social History Social History   Tobacco Use  . Smoking status: Current Every Day Smoker    Packs/day: 1.00    Years: 35.00    Pack years: 35.00    Types: Cigarettes  . Smokeless tobacco: Never Used  Substance Use Topics  . Alcohol use: No    Alcohol/week: 0.0 oz  . Drug use: No     Allergies   Sulfa antibiotics; Amicinonide-benzyl alcohol [cyclocort]; Amoxicillin; Doxycycline; Paxil [paroxetine hcl]; Penicillins; and Trimethoprim   Review of Systems Review of Systems  All other systems reviewed and are negative.    Physical  Exam Updated Vital Signs BP (!) 157/95 (BP Location: Left Arm)   Pulse 67   Temp 98.1 F (36.7 C) (Oral)   Resp 14   Ht 5\' 2"  (1.575 m)   Wt 63.5 kg (140 lb)   SpO2 99%   BMI 25.61 kg/m   Physical Exam  Constitutional: She is oriented to person, place, and time. She appears well-developed and well-nourished.  HENT:  Head: Normocephalic and atraumatic.  Eyes: Conjunctivae are normal.  Neck: Neck supple.  Cardiovascular: Normal rate and regular rhythm.  Pulmonary/Chest: Effort normal and breath sounds normal.  Abdominal: Soft. Bowel sounds are normal.  Genitourinary:  Genitourinary Comments: No CVAT.  Musculoskeletal:  Patient most tender in the left superior posterior pelvis area.  Neurological: She is alert and oriented to person, place, and time.  Skin: Skin is warm and dry.  Psychiatric: She has a normal mood and affect. Her behavior is normal.  Nursing note and vitals reviewed.    ED Treatments / Results  Labs (all labs ordered are listed, but only abnormal results are displayed) Labs Reviewed - No data to display  EKG None  Radiology Dg Lumbar Spine Complete  Result Date: 05/31/2017 CLINICAL DATA:  Fall, back pain EXAM: LUMBAR SPINE - COMPLETE 4+ VIEW COMPARISON:  CT myelogram  05/29/2016 FINDINGS: Normal alignment. Mild disc degeneration and disc space narrowing on the right L4-5 Negative for fracture or mass.  SI joints normal Adynamic ileus pattern IMPRESSION: Negative for fracture. Electronically Signed   By: Franchot Gallo M.D.   On: 05/31/2017 13:29   Dg Hip Unilat With Pelvis 2-3 Views Left  Result Date: 05/31/2017 CLINICAL DATA:  Fall, hip pain EXAM: DG HIP (WITH OR WITHOUT PELVIS) 2-3V LEFT COMPARISON:  None. FINDINGS: There is no evidence of hip fracture or dislocation. There is no evidence of arthropathy or other focal bone abnormality. IMPRESSION: Negative. Electronically Signed   By: Franchot Gallo M.D.   On: 05/31/2017 13:23    Procedures Procedures (including critical care time)  Medications Ordered in ED Medications - No data to display   Initial Impression / Assessment and Plan / ED Course  I have reviewed the triage vital signs and the nursing notes.  Pertinent labs & imaging results that were available during my care of the patient were reviewed by me and considered in my medical decision making (see chart for details).     History and physical most compatible with musculoskeletal traumatic pain.  Plain films of left hip and lumbar spine negative.  Patient is slightly limping at discharge.  Discharge medication Naprosyn 500 mg and Flexeril 10 mg  Final Clinical Impressions(s) / ED Diagnoses   Final diagnoses:  Lumbar back pain    ED Discharge Orders        Ordered    naproxen (NAPROSYN) 500 MG tablet  2 times daily     05/31/17 1406    cyclobenzaprine (FLEXERIL) 10 MG tablet  2 times daily PRN     05/31/17 1406       Nat Christen, MD 05/31/17 1457

## 2017-05-31 NOTE — ED Triage Notes (Addendum)
Patient c/o lower back pain and left flank pain after slipping and falling in shower yesterday. Per patient hit head but did not have LOC. Denies any blurred vision or dizziness. Patient does report nausea without vomiting. Patient does not take blood thinners. Denies any complications with BMs. CNS intact. Per patient dysuria with some blood noted. No hx of kidney stones. Patient took motrin without relief last night, no medication taken today for pain.

## 2017-05-31 NOTE — ED Notes (Signed)
Patient transported to X-ray 

## 2017-06-18 DIAGNOSIS — K219 Gastro-esophageal reflux disease without esophagitis: Secondary | ICD-10-CM | POA: Diagnosis not present

## 2017-06-18 DIAGNOSIS — E7849 Other hyperlipidemia: Secondary | ICD-10-CM | POA: Diagnosis not present

## 2017-06-18 DIAGNOSIS — M545 Low back pain: Secondary | ICD-10-CM | POA: Diagnosis not present

## 2017-06-18 DIAGNOSIS — M2550 Pain in unspecified joint: Secondary | ICD-10-CM | POA: Diagnosis not present

## 2017-07-17 DIAGNOSIS — J45909 Unspecified asthma, uncomplicated: Secondary | ICD-10-CM | POA: Diagnosis not present

## 2017-07-17 DIAGNOSIS — K219 Gastro-esophageal reflux disease without esophagitis: Secondary | ICD-10-CM | POA: Diagnosis not present

## 2017-07-17 DIAGNOSIS — E7849 Other hyperlipidemia: Secondary | ICD-10-CM | POA: Diagnosis not present

## 2017-07-17 DIAGNOSIS — J449 Chronic obstructive pulmonary disease, unspecified: Secondary | ICD-10-CM | POA: Diagnosis not present

## 2017-07-29 DIAGNOSIS — M545 Low back pain: Secondary | ICD-10-CM | POA: Diagnosis not present

## 2017-07-29 DIAGNOSIS — G8929 Other chronic pain: Secondary | ICD-10-CM | POA: Diagnosis not present

## 2017-07-29 DIAGNOSIS — B880 Other acariasis: Secondary | ICD-10-CM | POA: Diagnosis not present

## 2017-08-10 DIAGNOSIS — F4310 Post-traumatic stress disorder, unspecified: Secondary | ICD-10-CM | POA: Diagnosis not present

## 2017-11-12 ENCOUNTER — Ambulatory Visit: Payer: Medicare HMO | Admitting: Orthopaedic Surgery

## 2017-11-17 ENCOUNTER — Ambulatory Visit (INDEPENDENT_AMBULATORY_CARE_PROVIDER_SITE_OTHER): Payer: Medicare HMO | Admitting: Orthopaedic Surgery

## 2017-11-17 ENCOUNTER — Encounter: Payer: Self-pay | Admitting: Orthopaedic Surgery

## 2017-11-17 VITALS — BP 120/80 | HR 78 | Ht 62.0 in | Wt 158.0 lb

## 2017-11-17 DIAGNOSIS — M7660 Achilles tendinitis, unspecified leg: Secondary | ICD-10-CM

## 2017-11-17 DIAGNOSIS — F17210 Nicotine dependence, cigarettes, uncomplicated: Secondary | ICD-10-CM

## 2017-11-17 NOTE — Patient Instructions (Signed)
Steps to Quit Smoking Smoking tobacco can be bad for your health. It can also affect almost every organ in your body. Smoking puts you and people around you at risk for many serious long-lasting (chronic) diseases. Quitting smoking is hard, but it is one of the best things that you can do for your health. It is never too late to quit. What are the benefits of quitting smoking? When you quit smoking, you lower your risk for getting serious diseases and conditions. They can include:  Lung cancer or lung disease.  Heart disease.  Stroke.  Heart attack.  Not being able to have children (infertility).  Weak bones (osteoporosis) and broken bones (fractures).  If you have coughing, wheezing, and shortness of breath, those symptoms may get better when you quit. You may also get sick less often. If you are pregnant, quitting smoking can help to lower your chances of having a baby of low birth weight. What can I do to help me quit smoking? Talk with your doctor about what can help you quit smoking. Some things you can do (strategies) include:  Quitting smoking totally, instead of slowly cutting back how much you smoke over a period of time.  Going to in-person counseling. You are more likely to quit if you go to many counseling sessions.  Using resources and support systems, such as: ? Online chats with a counselor. ? Phone quitlines. ? Printed self-help materials. ? Support groups or group counseling. ? Text messaging programs. ? Mobile phone apps or applications.  Taking medicines. Some of these medicines may have nicotine in them. If you are pregnant or breastfeeding, do not take any medicines to quit smoking unless your doctor says it is okay. Talk with your doctor about counseling or other things that can help you.  Talk with your doctor about using more than one strategy at the same time, such as taking medicines while you are also going to in-person counseling. This can help make  quitting easier. What things can I do to make it easier to quit? Quitting smoking might feel very hard at first, but there is a lot that you can do to make it easier. Take these steps:  Talk to your family and friends. Ask them to support and encourage you.  Call phone quitlines, reach out to support groups, or work with a counselor.  Ask people who smoke to not smoke around you.  Avoid places that make you want (trigger) to smoke, such as: ? Bars. ? Parties. ? Smoke-break areas at work.  Spend time with people who do not smoke.  Lower the stress in your life. Stress can make you want to smoke. Try these things to help your stress: ? Getting regular exercise. ? Deep-breathing exercises. ? Yoga. ? Meditating. ? Doing a body scan. To do this, close your eyes, focus on one area of your body at a time from head to toe, and notice which parts of your body are tense. Try to relax the muscles in those areas.  Download or buy apps on your mobile phone or tablet that can help you stick to your quit plan. There are many free apps, such as QuitGuide from the CDC (Centers for Disease Control and Prevention). You can find more support from smokefree.gov and other websites.  This information is not intended to replace advice given to you by your health care provider. Make sure you discuss any questions you have with your health care provider. Document Released: 10/26/2008 Document   Revised: 08/28/2015 Document Reviewed: 05/16/2014 Elsevier Interactive Patient Education  2018 Elsevier Inc.  

## 2017-11-17 NOTE — Progress Notes (Signed)
Subjective:    Patient ID: Tanya Velazquez, female    DOB: 04-30-1960, 57 y.o.   MRN: [id number]  HPI She stepped in a small hole unexpectedly about a month to five weeks ago and began having pain in the right ankle area posteriorly since then.  She has seen her family doctor at [facility] in [place].  She has elevated, used ice and heat and Advil but still has some pain. She takes Naprosyn, gabapentin and oxycodone daily for lower back pain.  X-rays done of the ankle were negative.  I have reviewed the notes from [facility], the x-rays and x-ray report.   Review of Systems  Constitutional: Positive for activity change.  Musculoskeletal: Positive for arthralgias, back pain and gait problem.  Neurological: Positive for headaches.  Psychiatric/Behavioral: The patient is nervous/anxious.   All other systems reviewed and are negative.  For Review of Systems, all other systems reviewed and are negative.  The following is a summary of the past history medically, past history surgically, known current medicines, social history and family history.  This information is gathered electronically by the computer from prior information and documentation.  I review this each visit and have found including this information at this point in the chart is beneficial and informative.   Past Medical History:  Diagnosis Date  . Anxiety   . B12 deficiency anemia   . Chronic constipation   . Chronic constipation   . Chronic low back pain    lumbar radiculopathy  . COPD (chronic obstructive pulmonary disease) ([place])   . Diabetes mellitus   . DJD (degenerative joint disease)   . GERD (gastroesophageal reflux disease) 04/13/07   EGD Dr Rourk->patulous EG junction, HH, antral erosions  . H/O hiatal hernia   . Hyperplastic colon polyp 06/27/2010  . Hypertension   . Migraines   . Osteoarthritis   . Raspy voice   . Smokers' cough ([place])   . SUI (stress urinary incontinence, female)     Past Surgical History:    Procedure Laterality Date  . APPENDECTOMY    . BLADDER SUSPENSION  10/07/2011   Procedure: [facility] PROCEDURE;  Surgeon: Malka So, MD;  Location: [facility];  Service: Urology;  Laterality: N/A;  1 hour requested for this case   . CARDIAC CATHETERIZATION  08-09-2004 DR Einar Gip   NORMAL CORONARY ARTERIES/  NORMAL LVF  . CARDIAC CATHETERIZATION  2006   normal coronary arteries  . COLONOSCOPY  3/31/9   Dr Rourk->friable anal canal  . COLONOSCOPY  0 06/27/10   Dr. Gala Romney hyperplastic polyp, prominent external hemorrhoid plexus. next tcs 06/2015.  . CYSTOSCOPY  10/07/2011   Procedure: CYSTOSCOPY;  Surgeon: Malka So, MD;  Location: [facility];  Service: Urology;  Laterality: N/A;  . ESOPHAGEAL MANOMETRY  2013   Dr. Derrill Kay at [facility]. incomplete bolus clearance with some breaks in contractions but there was peristalsis  . ESOPHAGOGASTRODUODENOSCOPY  06/27/10   Hiatal hernia, GERD,  . EXCISION VOLAR GANGLION LEFT WRIST  06-27-2005  . HEAD-UP TILT TABLE TEST  01-21-2007 &  11-04-2004  DR Rollene Fare   HX CHRONIC RECURRENT SYNCOPAL. EPICODES--  NEGATIVE RESULT INCLUDING ISUPREL INFUSION  . HEMORRHOID SURGERY  09-13-2003  . LAPAROSCOPIC CHOLECYSTECTOMY  02-23-2007  . LAPAROTOMY W/ APPENDCTOMY AND LEFT SALPINGO-OOPHECTOMY  AGE 15'S  . pH/impedence  2013   Dr. Derrill Kay at [facility]. Increased reflux off PPI with excellent correlation between reflux events and regurgitation. Patient advised to hold PPI for study  by Dr. Derrill Kay.   Marland Kitchen RIGHT SHOULDER ARTHROSCOPY/ DISTAL CLAVICLE RESECTION / DEBRIDEMENT ROTATOR CUFF AND LABRUM TEAR/ BURSECTOMY  06-28-2010   IMPINGEMENT SYNDROME/ AC JOINT ARTHRITIS/ ROTATOR CUFF TENDINOPATHY  . SHOULDER ARTHROSCOPY  08-13-2010   LEFT SHOULDER IMPINGEMENT SYNDROME/ DJD OF AC JOINT  . STOMACH SURGERY  2013   antireflux surgery at [place]  age 9's    Current Outpatient Medications on File Prior to Visit  Medication Sig  Dispense Refill  . aspirin EC 81 MG tablet Take 81 mg by mouth every other day.     . cholecalciferol (VITAMIN D) 1000 UNITS tablet Take 1,000 Units by mouth daily.    . cyclobenzaprine (FLEXERIL) 10 MG tablet Take 1 tablet (10 mg total) by mouth 2 (two) times daily as needed for muscle spasms. 20 tablet 0  . dexlansoprazole (DEXILANT) 60 MG capsule Take 1 capsule (60 mg total) by mouth 2 (two) times daily before a meal. 60 capsule 3  . gabapentin (NEURONTIN) 300 MG capsule Take 300 mg by mouth 3 (three) times daily.    . hydrOXYzine (VISTARIL) 25 MG capsule Take 1 capsule by mouth 3 (three) times daily.    Marland Kitchen ibuprofen (ADVIL,MOTRIN) 200 MG tablet Take 400 mg by mouth every 6 (six) hours as needed for pain.     Marland Kitchen lisinopril (PRINIVIL,ZESTRIL) 20 MG tablet Take 1 tablet by mouth daily.    Marland Kitchen LORazepam (ATIVAN) 1 MG tablet Take 1 tablet by mouth daily as needed.     . naproxen (NAPROSYN) 500 MG tablet Take 1 tablet (500 mg total) by mouth 2 (two) times daily. 20 tablet 0  . Oxycodone HCl 10 MG TABS Take 5 mg by mouth 3 (three) times daily. 1    . pravastatin (PRAVACHOL) 20 MG tablet Take 1 tablet by mouth daily.    Marland Kitchen PROAIR HFA 108 (90 BASE) MCG/ACT inhaler Inhale 1 puff into the lungs every 4 (four) hours as needed.     Marland Kitchen QUEtiapine (SEROQUEL) 300 MG tablet Take by mouth.    . OLANZapine (ZYPREXA) 5 MG tablet Take 5 mg by mouth at bedtime.    . vortioxetine HBr (TRINTELLIX) 10 MG TABS tablet Take by mouth.     No current facility-administered medications on file prior to visit.     Social History   Socioeconomic History  . Marital status: Divorced    Spouse name: Not on file  . Number of children: 3  . Years of education: Not on file  . Highest education level: Not on file  Occupational History  . Occupation: disabled    Fish farm manager: UNEMPLOYED  Social Needs  . Financial resource strain: Not on file  . Food insecurity:    Worry: Not on file    Inability: Not on file  . Transportation  needs:    Medical: Not on file    Non-medical: Not on file  Tobacco Use  . Smoking status: Current Every Day Smoker    Packs/day: 1.00    Years: 35.00    Pack years: 35.00    Types: Cigarettes  . Smokeless tobacco: Never Used  Substance and Sexual Activity  . Alcohol use: No    Alcohol/week: 0.0 standard drinks  . Drug use: No  . Sexual activity: Yes    Birth control/protection: Surgical  Lifestyle  . Physical activity:    Days per week: Not on file    Minutes per session: Not on file  . Stress: Not on  file  Relationships  . Social connections:    Talks on phone: Not on file    Gets together: Not on file    Attends religious service: Not on file    Active member of club or organization: Not on file    Attends meetings of clubs or organizations: Not on file    Relationship status: Not on file  . Intimate partner violence:    Fear of current or ex partner: Not on file    Emotionally abused: Not on file    Physically abused: Not on file    Forced sexual activity: Not on file  Other Topics Concern  . Not on file  Social History Narrative   Lives alone in a one story home.  Has 2 children.     On disability since 2007 for syncope.  Did work in [employer] fundraiser.     Education: high school.    Family History  Problem Relation Age of Onset  . Colon cancer Mother 13  . Hypertension Mother   . Heart disease Mother   . Alzheimer's disease Father   . Hypertension Father   . Healthy Daughter     BP 120/80   Pulse 78   Ht 5\' 2"  (1.575 m)   Wt 158 lb (71.7 kg)   BMI 28.90 kg/m   Body mass index is 28.9 kg/m.      Objective:   Physical Exam  Constitutional: She is oriented to person, place, and time. She appears well-developed and well-nourished.  HENT:  Head: Normocephalic and atraumatic.  Eyes: Pupils are equal, round, and reactive to light. Conjunctivae and EOM are normal.  Neck: Normal range of motion. Neck supple.  Cardiovascular: Normal rate, regular rhythm and  intact distal pulses.  Pulmonary/Chest: Effort normal.  Abdominal: Soft.  Musculoskeletal:       Feet:  Neurological: She is alert and oriented to person, place, and time. She has normal reflexes. She displays normal reflexes. No cranial nerve deficit. She exhibits normal muscle tone. Coordination normal.  Skin: Skin is warm and dry.  Psychiatric: She has a normal mood and affect. Her behavior is normal. Judgment and thought content normal.          Assessment & Plan:   Encounter Diagnoses  Name Primary?  . Achilles tendon pain Yes  . Cigarette nicotine dependence without complication    I think she has an Achilles strain.  I will give CAM walker.  Use ice, heat, Aspercreme or BioFreeze to the area.  Return in two weeks.  Call if any problem.  Precautions discussed.   Electronically Signed Sanjuana Kava, MD 11/5/20191:51 PM

## 2017-12-01 ENCOUNTER — Ambulatory Visit (INDEPENDENT_AMBULATORY_CARE_PROVIDER_SITE_OTHER): Payer: Medicare HMO | Admitting: Orthopaedic Surgery

## 2017-12-01 ENCOUNTER — Telehealth: Payer: Self-pay | Admitting: Radiology

## 2017-12-01 ENCOUNTER — Encounter: Payer: Self-pay | Admitting: Orthopaedic Surgery

## 2017-12-01 VITALS — BP 117/77 | HR 91 | Ht 62.0 in | Wt 158.0 lb

## 2017-12-01 DIAGNOSIS — M7660 Achilles tendinitis, unspecified leg: Secondary | ICD-10-CM

## 2017-12-01 DIAGNOSIS — F17210 Nicotine dependence, cigarettes, uncomplicated: Secondary | ICD-10-CM

## 2017-12-01 MED ORDER — HYDROCODONE-ACETAMINOPHEN 5-325 MG PO TABS: ORAL_TABLET | ORAL | 0 refills | Status: DC

## 2017-12-01 NOTE — Patient Instructions (Signed)
Steps to Quit Smoking Smoking tobacco can be bad for your health. It can also affect almost every organ in your body. Smoking puts you and people around you at risk for many serious long-lasting (chronic) diseases. Quitting smoking is hard, but it is one of the best things that you can do for your health. It is never too late to quit. What are the benefits of quitting smoking? When you quit smoking, you lower your risk for getting serious diseases and conditions. They can include:  Lung cancer or lung disease.  Heart disease.  Stroke.  Heart attack.  Not being able to have children (infertility).  Weak bones (osteoporosis) and broken bones (fractures).  If you have coughing, wheezing, and shortness of breath, those symptoms may get better when you quit. You may also get sick less often. If you are pregnant, quitting smoking can help to lower your chances of having a baby of low birth weight. What can I do to help me quit smoking? Talk with your doctor about what can help you quit smoking. Some things you can do (strategies) include:  Quitting smoking totally, instead of slowly cutting back how much you smoke over a period of time.  Going to in-person counseling. You are more likely to quit if you go to many counseling sessions.  Using resources and support systems, such as: ? Online chats with a counselor. ? Phone quitlines. ? Printed self-help materials. ? Support groups or group counseling. ? Text messaging programs. ? Mobile phone apps or applications.  Taking medicines. Some of these medicines may have nicotine in them. If you are pregnant or breastfeeding, do not take any medicines to quit smoking unless your doctor says it is okay. Talk with your doctor about counseling or other things that can help you.  Talk with your doctor about using more than one strategy at the same time, such as taking medicines while you are also going to in-person counseling. This can help make  quitting easier. What things can I do to make it easier to quit? Quitting smoking might feel very hard at first, but there is a lot that you can do to make it easier. Take these steps:  Talk to your family and friends. Ask them to support and encourage you.  Call phone quitlines, reach out to support groups, or work with a counselor.  Ask people who smoke to not smoke around you.  Avoid places that make you want (trigger) to smoke, such as: ? Bars. ? Parties. ? Smoke-break areas at work.  Spend time with people who do not smoke.  Lower the stress in your life. Stress can make you want to smoke. Try these things to help your stress: ? Getting regular exercise. ? Deep-breathing exercises. ? Yoga. ? Meditating. ? Doing a body scan. To do this, close your eyes, focus on one area of your body at a time from head to toe, and notice which parts of your body are tense. Try to relax the muscles in those areas.  Download or buy apps on your mobile phone or tablet that can help you stick to your quit plan. There are many free apps, such as QuitGuide from the CDC (Centers for Disease Control and Prevention). You can find more support from smokefree.gov and other websites.  This information is not intended to replace advice given to you by your health care provider. Make sure you discuss any questions you have with your health care provider. Document Released: 10/26/2008 Document   Revised: 08/28/2015 Document Reviewed: 05/16/2014 Elsevier Interactive Patient Education  2018 Elsevier Inc.  

## 2017-12-01 NOTE — Progress Notes (Signed)
Patient Tanya Velazquez, female DOB:November 24, 1960, 57 y.o. CBJ:[id number]  Chief Complaint  Patient presents with  . Ankle Pain    right achilles still painful     HPI  Tanya Velazquez is a 57 y.o. female who has continued pain of the right achilles.  She has been in the CAM walker. She still has pain and swelling.  I will get a MRI to further evaluate it.   Body mass index is 28.9 kg/m.  ROS  Review of Systems  Constitutional: Positive for activity change.  Musculoskeletal: Positive for arthralgias, back pain and gait problem.  Neurological: Positive for headaches.  Psychiatric/Behavioral: The patient is nervous/anxious.   All other systems reviewed and are negative.   All other systems reviewed and are negative.  The following is a summary of the past history medically, past history surgically, known current medicines, social history and family history.  This information is gathered electronically by the computer from prior information and documentation.  I review this each visit and have found including this information at this point in the chart is beneficial and informative.    Past Medical History:  Diagnosis Date  . Anxiety   . B12 deficiency anemia   . Chronic constipation   . Chronic constipation   . Chronic low back pain    lumbar radiculopathy  . COPD (chronic obstructive pulmonary disease) ([place])   . Diabetes mellitus   . DJD (degenerative joint disease)   . GERD (gastroesophageal reflux disease) 04/13/07   EGD Dr Rourk->patulous EG junction, HH, antral erosions  . H/O hiatal hernia   . Hyperplastic colon polyp 06/27/2010  . Hypertension   . Migraines   . Osteoarthritis   . Raspy voice   . Smokers' cough ([place])   . SUI (stress urinary incontinence, female)     Past Surgical History:  Procedure Laterality Date  . APPENDECTOMY    . BLADDER SUSPENSION  10/07/2011   Procedure: [facility] PROCEDURE;  Surgeon: Malka So, MD;  Location: [facility];  Service: Urology;  Laterality: N/A;  1 hour requested for this case   . CARDIAC CATHETERIZATION  08-09-2004 DR Einar Gip   NORMAL CORONARY ARTERIES/  NORMAL LVF  . CARDIAC CATHETERIZATION  2006   normal coronary arteries  . COLONOSCOPY  3/31/9   Dr Rourk->friable anal canal  . COLONOSCOPY  0 06/27/10   Dr. Gala Romney hyperplastic polyp, prominent external hemorrhoid plexus. next tcs 06/2015.  . CYSTOSCOPY  10/07/2011   Procedure: CYSTOSCOPY;  Surgeon: Malka So, MD;  [facility]: [facility];  Service: Urology;  Laterality: N/A;  . ESOPHAGEAL MANOMETRY  2013   Dr. Derrill Kay at [facility]. incomplete bolus clearance with some breaks in contractions but there was peristalsis  . ESOPHAGOGASTRODUODENOSCOPY  06/27/10   Hiatal hernia, GERD,  . EXCISION VOLAR GANGLION LEFT WRIST  06-27-2005  . HEAD-UP TILT TABLE TEST  01-21-2007 &  11-04-2004  DR Rollene Fare   HX CHRONIC RECURRENT SYNCOPAL. EPICODES--  NEGATIVE RESULT INCLUDING ISUPREL INFUSION  . HEMORRHOID SURGERY  09-13-2003  . LAPAROSCOPIC CHOLECYSTECTOMY  02-23-2007  . LAPAROTOMY W/ APPENDCTOMY AND LEFT SALPINGO-OOPHECTOMY  AGE 59'S  . pH/impedence  2013   [facility] Derrill [facility]. Increased reflux off PPI with excellent correlation between reflux events and regurgitation. Patient advised to hold PPI for study by Dr. Derrill Kay.   Marland Kitchen RIGHT SHOULDER ARTHROSCOPY/ DISTAL CLAVICLE RESECTION / DEBRIDEMENT ROTATOR CUFF AND LABRUM TEAR/ BURSECTOMY  06-28-2010   IMPINGEMENT SYNDROME/ [facility] JOINT ARTHRITIS/  ROTATOR CUFF TENDINOPATHY  . SHOULDER ARTHROSCOPY  08-13-2010   LEFT SHOULDER IMPINGEMENT SYNDROME/ DJD OF AC JOINT  . STOMACH SURGERY  2013   [facility]flux surgery at [facility]  . VAGINAL HYSTERECTOMY  age 38's    Family History  Problem Relation Age of Onset  . Colon cancer Mother 62  . Hypertension Mother   . Heart disease Mother   . Alzheimer's disease Father   . Hypertension Father   . Healthy Daughter     Social History Social History    Tobacco Use  . Smoking status: Current Every Day Smoker    Packs/day: 1.00    Years: 35.00    Pack years: 35.00    Types: Cigarettes  . Smokeless tobacco: Never Used  Substance Use Topics  . Alcohol use: No    Alcohol/week: 0.0 standard drinks  . Drug use: No    Allergies  Allergen Reactions  . Sulfa Antibiotics   . Amicinonide-Benzyl Alcohol [Cyclocort] Rash  . Amoxicillin Rash  . Doxycycline Rash  . Paxil [Paroxetine Hcl] Rash  . Penicillins Rash    Has patient had a PCN reaction causing immediate rash, facial/tongue/throat swelling, SOB or lightheadedness with hypotension: Yes Has patient had a PCN reaction causing severe rash involving mucus membranes or skin necrosis: Yes Has patient had a PCN reaction that required hospitalization: No Has patient had a PCN reaction occurring within the last 10 years: No If all of the above answers are "NO", then may proceed with Cephalosporin use.   . Trimethoprim Rash    Current Outpatient Medications  Medication Sig Dispense Refill  . aspirin EC 81 MG tablet Take 81 mg by mouth every other day.     . cholecalciferol (VITAMIN D) 1000 UNITS tablet Take 1,000 Units by mouth daily.    . cyclobenzaprine (FLEXERIL) 10 MG tablet Take 1 tablet (10 mg total) by mouth 2 (two) times daily as needed for muscle spasms. 20 tablet 0  . dexlansoprazole (DEXILANT) 60 MG capsule Take 1 capsule (60 mg total) by mouth 2 (two) times daily before a meal. 60 capsule 3  . gabapentin (NEURONTIN) 300 MG capsule Take 300 mg by mouth 3 (three) times daily.    . hydrOXYzine (VISTARIL) 25 MG capsule Take 1 capsule by mouth 3 (three) times daily.    Marland Kitchen ibuprofen (ADVIL,MOTRIN) 200 MG tablet Take 400 mg by mouth every 6 (six) hours as needed for pain.     Marland Kitchen lisinopril (PRINIVIL,ZESTRIL) 20 MG tablet Take 1 tablet by mouth daily.    Marland Kitchen LORazepam (ATIVAN) 1 MG tablet Take 1 tablet by mouth daily as needed.     . naproxen (NAPROSYN) 500 MG tablet Take 1 tablet (500  mg total) by mouth 2 (two) times daily. 20 tablet 0  . OLANZapine (ZYPREXA) 5 MG tablet Take 5 mg by mouth at bedtime.    . pravastatin (PRAVACHOL) 20 MG tablet Take 1 tablet by mouth daily.    Marland Kitchen PROAIR HFA 108 (90 BASE) MCG/ACT inhaler Inhale 1 puff into the lungs every 4 (four) hours as needed.     Marland Kitchen QUEtiapine (SEROQUEL) 300 MG tablet Take by mouth.    . vortioxetine HBr (TRINTELLIX) 10 MG TABS tablet Take by mouth.    Marland Kitchen HYDROcodone-acetaminophen (NORCO/VICODIN) 5-325 MG tablet One tablet every four hours as needed for acute pain.  Limit of five days per [place] statue. 30 tablet 0   No current facility-administered medications for this visit.  Physical Exam  Blood pressure 117/77, pulse 91, height 5\' 2"  (1.575 m), weight 158 lb (71.7 kg).  Constitutional: overall normal hygiene, normal nutrition, well developed, normal grooming, normal body habitus. Assistive device:CAM walker right  Musculoskeletal: gait and station Limp right, muscle tone and strength are normal, no tremors or atrophy is present.  .  Neurological: coordination overall normal.  Deep tendon reflex/nerve stretch intact.  Sensation normal.  Cranial nerves II-XII intact.   Skin:   Normal overall no scars, lesions, ulcers or rashes. No psoriasis.  Psychiatric: Alert and oriented x 3.  Recent memory intact, remote memory unclear.  Normal mood and affect. Well groomed.  Good eye contact.  Cardiovascular: overall no swelling, no varicosities, no edema bilaterally, normal temperatures of the legs and arms, no clubbing, cyanosis and good capillary refill.  Lymphatic: palpation is normal.  Right Achilles remains tender and with some swelling distally. I feel no defect.  All other systems reviewed and are negative   The patient has been educated about the nature of the problem(s) and counseled on treatment options.  The patient appeared to understand what I have discussed and is in agreement with it.  Encounter  Diagnoses  Name Primary?  . Achilles tendon pain Yes  . Cigarette nicotine dependence without complication     I am concerned about a partial tear of the Achilles. She is not getting any better.  I will get a MRI.  PLAN Call if any problems.  Precautions discussed.  Continue current medications.   Return to clinic 2 weeks  I have reviewed the [place] web site prior to prescribing narcotic medicine for this patient.     Get MRI of the right Achilles.  Electronically Signed Sanjuana Kava, MD 11/19/201910:12 AM

## 2017-12-01 NOTE — Telephone Encounter (Signed)
Have gotten auth from [facility] called to schedule Tanya Velazquez Monday Nov 25th 3 pm arrive 230.

## 2017-12-07 ENCOUNTER — Ambulatory Visit (HOSPITAL_COMMUNITY)
Admission: RE | Admit: 2017-12-07 | Discharge: 2017-12-07 | Disposition: A | Discharge status: Home / Self Care | Payer: Medicare HMO | Source: Ambulatory Visit | Attending: Orthopaedic Surgery | Admitting: Orthopaedic Surgery

## 2017-12-07 ENCOUNTER — Other Ambulatory Visit (HOSPITAL_COMMUNITY): Payer: Self-pay | Admitting: Family Medicine

## 2017-12-07 DIAGNOSIS — S86011A Strain of right Achilles tendon, initial encounter: Secondary | ICD-10-CM | POA: Diagnosis not present

## 2017-12-07 DIAGNOSIS — X58XXXA Exposure to other specified factors, initial encounter: Secondary | ICD-10-CM | POA: Diagnosis not present

## 2017-12-07 DIAGNOSIS — M7661 Achilles tendinitis, right leg: Secondary | ICD-10-CM | POA: Diagnosis not present

## 2017-12-07 DIAGNOSIS — Z1231 Encounter for screening mammogram for malignant neoplasm of breast: Secondary | ICD-10-CM

## 2017-12-07 DIAGNOSIS — M7660 Achilles tendinitis, unspecified leg: Secondary | ICD-10-CM

## 2017-12-15 ENCOUNTER — Encounter: Payer: Self-pay | Admitting: Orthopaedic Surgery

## 2017-12-15 ENCOUNTER — Ambulatory Visit (INDEPENDENT_AMBULATORY_CARE_PROVIDER_SITE_OTHER): Payer: Medicare HMO | Admitting: Orthopaedic Surgery

## 2017-12-15 VITALS — BP 119/81 | HR 74 | Ht 62.0 in | Wt 155.0 lb

## 2017-12-15 DIAGNOSIS — M7660 Achilles tendinitis, unspecified leg: Secondary | ICD-10-CM | POA: Diagnosis not present

## 2017-12-15 DIAGNOSIS — F17210 Nicotine dependence, cigarettes, uncomplicated: Secondary | ICD-10-CM

## 2017-12-15 NOTE — Progress Notes (Signed)
Patient Tanya Velazquez, female DOB:[birth year], 57 y.o. JAS:[id number]  Chief Complaint  Patient presents with  . Ankle Pain    Right achilles tendon pain.    HPI  Tanya Velazquez is a 57 y.o. female who has right achilles pain.  She has been in a CAM walker.  MRI showed: IMPRESSION: 1. Moderate Achilles tendinosis with small focal partial tear of the mid to distal tendon.  I have explained the findings to her.  She is to wear the CAM walker.  A new one is given as the one she has had a strap failure.  It is replaced.   Body mass index is 28.35 kg/m.  ROS  Review of Systems  Constitutional: Positive for activity change.  Musculoskeletal: Positive for arthralgias, back pain and gait problem.  Neurological: Positive for headaches.  Psychiatric/Behavioral: The patient is nervous/anxious.   All other systems reviewed and are negative.   All other systems reviewed and are negative.  The following is a summary of the past history medically, past history surgically, known current medicines, social history and family history.  This information is gathered electronically by the computer from prior information and documentation.  I review this each visit and have found including this information at this point in the chart is beneficial and informative.    Past Medical History:  Diagnosis Date  . Anxiety   . B12 deficiency anemia   . Chronic constipation   . Chronic constipation   . Chronic low back pain    lumbar radiculopathy  . COPD (chronic obstructive pulmonary disease) ([place])   . Diabetes mellitus   . DJD (degenerative joint disease)   . GERD (gastroesophageal reflux disease) 04/13/07   EGD Dr Rourk->patulous EG junction, HH, antral erosions  . H/O hiatal hernia   . Hyperplastic colon polyp 06/27/2010  . Hypertension   . Migraines   . Osteoarthritis   . Raspy voice   . Smokers' cough ([place])   . SUI (stress urinary incontinence, female)     Past Surgical History:   Procedure Laterality Date  . APPENDECTOMY    . BLADDER SUSPENSION  10/07/2011   Procedure: [facility] PROCEDURE;  Surgeon: Malka So, MD;  Location: [facility];  Service: Urology;  Laterality: N/A;  1 hour requested for this case   . CARDIAC CATHETERIZATION  08-09-2004 DR Einar Gip   NORMAL CORONARY ARTERIES/  NORMAL LVF  . CARDIAC CATHETERIZATION  2006   normal coronary arteries  . COLONOSCOPY  3/31/9   Dr Rourk->friable anal canal  . COLONOSCOPY  0 06/27/10   Dr. Gala Romney hyperplastic polyp, prominent external hemorrhoid plexus. next tcs 06/2015.  . CYSTOSCOPY  10/07/2011   Procedure: CYSTOSCOPY;  Surgeon: Malka So, MD;  Location: [facility];  Service: Urology;  Laterality: N/A;  . ESOPHAGEAL MANOMETRY  2013   Dr. Derrill Kay at [facility]. incomplete bolus clearance with some breaks in contractions but there was peristalsis  . ESOPHAGOGASTRODUODENOSCOPY  06/27/10   Hiatal hernia, GERD,  . EXCISION VOLAR GANGLION LEFT WRIST  06-27-2005  . HEAD-UP TILT TABLE TEST  01-21-2007 &  11-04-2004  DR Rollene Fare   HX CHRONIC RECURRENT SYNCOPAL. EPICODES--  NEGATIVE RESULT INCLUDING ISUPREL INFUSION  . HEMORRHOID SURGERY  09-13-2003  . LAPAROSCOPIC CHOLECYSTECTOMY  02-23-2007  . LAPAROTOMY W/ APPENDCTOMY AND LEFT SALPINGO-OOPHECTOMY  AGE 43'S  . pH/impedence  2013   Dr. Derrill [facility] at [facility]. Increased reflux off PPI with excellent correlation between reflux events and regurgitation. Patient advised  to hold PPI for study by Dr. Derrill Kay.   Marland Kitchen RIGHT SHOULDER ARTHROSCOPY/ DISTAL CLAVICLE RESECTION / DEBRIDEMENT ROTATOR CUFF AND LABRUM TEAR/ BURSECTOMY  06-28-2010   IMPINGEMENT SYNDROME/ AC JOINT ARTHRITIS/ ROTATOR CUFF TENDINOPATHY  . SHOULDER ARTHROSCOPY  08-13-2010   LEFT SHOULDER IMPINGEMENT SYNDROME/ DJD OF AC JOINT  . STOMACH SURGERY  2013   antireflux surgery at [facility]  . VAGINAL HYSTERECTOMY  age 79's    Family History  Problem Relation Age of Onset  . Colon cancer Mother 47   . Hypertension Mother   . Heart disease Mother   . Alzheimer's disease Father   . Hypertension Father   . Healthy Daughter     Social History Social History   Tobacco Use  . Smoking status: Current Every Day Smoker    Packs/day: 1.00    Years: 35.00    Pack years: 35.00    Types: Cigarettes  . Smokeless tobacco: Never Used  Substance Use Topics  . Alcohol use: No    Alcohol/week: 0.0 standard drinks  . Drug use: No    Allergies  Allergen Reactions  . Sulfa Antibiotics   . Amicinonide-Benzyl Alcohol [Cyclocort] Rash  . Amoxicillin Rash  . Doxycycline Rash  . Paxil [Paroxetine Hcl] Rash  . Penicillins Rash    Has patient had a PCN reaction causing immediate rash, facial/tongue/throat swelling, SOB or lightheadedness with hypotension: Yes Has patient had a PCN reaction causing severe rash involving mucus membranes or skin necrosis: Yes Has patient had a PCN reaction that required hospitalization: No Has patient had a PCN reaction occurring within the last 10 years: No If all of the above answers are "NO", then may proceed with Cephalosporin use.   . Trimethoprim Rash    Current Outpatient Medications  Medication Sig Dispense Refill  . aspirin EC 81 MG tablet Take 81 mg by mouth every other day.     . cholecalciferol (VITAMIN D) 1000 UNITS tablet Take 1,000 Units by mouth daily.    . cyclobenzaprine (FLEXERIL) 10 MG tablet Take 1 tablet (10 mg total) by mouth 2 (two) times daily as needed for muscle spasms. 20 tablet 0  . dexlansoprazole (DEXILANT) 60 MG capsule Take 1 capsule (60 mg total) by mouth 2 (two) times daily before a meal. 60 capsule 3  . gabapentin (NEURONTIN) 300 MG capsule Take 300 mg by mouth 3 (three) times daily.    Marland Kitchen HYDROcodone-acetaminophen (NORCO/VICODIN) 5-325 MG tablet One tablet every four hours as needed for acute pain.  Limit of five days per [place] statue. 30 tablet 0  . hydrOXYzine (VISTARIL) 25 MG capsule Take 1 capsule by mouth 3 (three)  times daily.    Marland Kitchen ibuprofen (ADVIL,MOTRIN) 200 MG tablet Take 400 mg by mouth every 6 (six) hours as needed for pain.     Marland Kitchen lisinopril (PRINIVIL,ZESTRIL) 20 MG tablet Take 1 tablet by mouth daily.    Marland Kitchen LORazepam (ATIVAN) 1 MG tablet Take 1 tablet by mouth daily as needed.     . naproxen (NAPROSYN) 500 MG tablet Take 1 tablet (500 mg total) by mouth 2 (two) times daily. 20 tablet 0  . OLANZapine (ZYPREXA) 5 MG tablet Take 5 mg by mouth at bedtime.    . pravastatin (PRAVACHOL) 20 MG tablet Take 1 tablet by mouth daily.    Marland Kitchen PROAIR HFA 108 (90 BASE) MCG/ACT inhaler Inhale 1 puff into the lungs every 4 (four) hours as needed.     Marland Kitchen QUEtiapine (SEROQUEL) 300 MG  tablet Take by mouth.    . vortioxetine HBr (TRINTELLIX) 10 MG TABS tablet Take by mouth.     No current facility-administered medications for this visit.      Physical Exam  Blood pressure 119/81, pulse 74, height 5\' 2"  (1.575 m), weight 155 lb (70.3 kg).  Constitutional: overall normal hygiene, normal nutrition, well developed, normal grooming, normal body habitus. Assistive device:CAM walker  Musculoskeletal: gait and station Limp Right, muscle tone and strength are normal, no tremors or atrophy is present.  .  Neurological: coordination overall normal.  Deep tendon reflex/nerve stretch intact.  Sensation normal.  Cranial nerves II-XII intact.   Skin:   Normal overall no scars, lesions, ulcers or rashes. No psoriasis.  Psychiatric: Alert and oriented x 3.  Recent memory intact, remote memory unclear.  Normal mood and affect. Well groomed.  Good eye contact.  Cardiovascular: overall no swelling, no varicosities, no edema bilaterally, normal temperatures of the legs and arms, no clubbing, cyanosis and good capillary refill.  Lymphatic: palpation is normal.  Right mid Achilles with tenderness, but no defect. ROM is full.  NV intact.  There is no redness. All other systems reviewed and are negative   The patient has been  educated about the nature of the problem(s) and counseled on treatment options.  The patient appeared to understand what I have discussed and is in agreement with it.  Encounter Diagnoses  Name Primary?  . Achilles tendon pain Yes  . Cigarette nicotine dependence without complication     PLAN Call if any problems.  Precautions discussed.  Continue current medications.   Return to clinic 6 weeks   Electronically Signed Sanjuana Kava, MD 12/3/20198:49 AM

## 2017-12-23 ENCOUNTER — Ambulatory Visit (HOSPITAL_COMMUNITY): Payer: Self-pay

## 2017-12-30 ENCOUNTER — Ambulatory Visit (HOSPITAL_COMMUNITY)
Admission: RE | Admit: 2017-12-30 | Discharge: 2017-12-30 | Disposition: A | Discharge status: Home / Self Care | Payer: Medicare HMO | Source: Ambulatory Visit | Attending: Family Medicine | Admitting: Family Medicine

## 2017-12-30 ENCOUNTER — Encounter (HOSPITAL_COMMUNITY): Payer: Self-pay

## 2017-12-30 DIAGNOSIS — Z1231 Encounter for screening mammogram for malignant neoplasm of breast: Secondary | ICD-10-CM | POA: Diagnosis present

## 2017-12-31 ENCOUNTER — Ambulatory Visit (HOSPITAL_COMMUNITY): Payer: Self-pay

## 2018-01-19 ENCOUNTER — Encounter: Payer: Self-pay | Admitting: Orthopaedic Surgery

## 2018-01-19 ENCOUNTER — Ambulatory Visit (INDEPENDENT_AMBULATORY_CARE_PROVIDER_SITE_OTHER): Payer: Medicare HMO | Admitting: Orthopaedic Surgery

## 2018-01-19 VITALS — BP 136/89 | HR 82 | Ht 62.0 in | Wt 155.0 lb

## 2018-01-19 DIAGNOSIS — M7661 Achilles tendinitis, right leg: Secondary | ICD-10-CM | POA: Diagnosis not present

## 2018-01-19 DIAGNOSIS — M7660 Achilles tendinitis, unspecified leg: Secondary | ICD-10-CM

## 2018-01-19 DIAGNOSIS — F17210 Nicotine dependence, cigarettes, uncomplicated: Secondary | ICD-10-CM

## 2018-01-19 NOTE — Progress Notes (Signed)
CC:  My Achilles is slightly better  She still has more medial distal Achilles tendon pain on the right.  She has no new trauma.  She has been using the CAM walker.  NV intact. ROM is full but tender, she has no defect.  Encounter Diagnoses  Name Primary?  . Achilles tendon pain Yes  . Cigarette nicotine dependence without complication    Procedure note:  After permission from the patient the area just to the medial side of the distal Right Achilles was injected by sterile technique tolerated well with 1 % xylocaine plain and 1 cc of DepoMedrol 40.  Return in one month.  Continue the CAM walker.  Call if any problem.  Precautions discussed.   Electronically Signed Sanjuana Kava, MD 1/7/202010:23 AM

## 2018-02-16 ENCOUNTER — Ambulatory Visit: Payer: Medicare HMO | Admitting: Orthopaedic Surgery

## 2018-02-18 ENCOUNTER — Ambulatory Visit: Payer: Medicare HMO | Admitting: Orthopaedic Surgery

## 2018-02-23 ENCOUNTER — Other Ambulatory Visit: Payer: Self-pay

## 2018-02-23 ENCOUNTER — Emergency Department (HOSPITAL_COMMUNITY)
Admission: EM | Admit: 2018-02-23 | Discharge: 2018-02-23 | Disposition: A | Discharge status: Home / Self Care | Payer: Medicare HMO | Attending: Emergency Medicine | Admitting: Emergency Medicine

## 2018-02-23 ENCOUNTER — Encounter (HOSPITAL_COMMUNITY): Payer: Self-pay | Admitting: Emergency Medicine

## 2018-02-23 ENCOUNTER — Emergency Department (HOSPITAL_COMMUNITY): Payer: Medicare HMO

## 2018-02-23 ENCOUNTER — Ambulatory Visit: Payer: Medicare HMO | Admitting: Orthopaedic Surgery

## 2018-02-23 DIAGNOSIS — Y92410 Unspecified street and highway as the place of occurrence of the external cause: Secondary | ICD-10-CM | POA: Insufficient documentation

## 2018-02-23 DIAGNOSIS — S5011XA Contusion of right forearm, initial encounter: Secondary | ICD-10-CM | POA: Diagnosis not present

## 2018-02-23 DIAGNOSIS — J449 Chronic obstructive pulmonary disease, unspecified: Secondary | ICD-10-CM | POA: Insufficient documentation

## 2018-02-23 DIAGNOSIS — Y998 Other external cause status: Secondary | ICD-10-CM | POA: Insufficient documentation

## 2018-02-23 DIAGNOSIS — E119 Type 2 diabetes mellitus without complications: Secondary | ICD-10-CM | POA: Insufficient documentation

## 2018-02-23 DIAGNOSIS — S20212A Contusion of left front wall of thorax, initial encounter: Secondary | ICD-10-CM | POA: Diagnosis not present

## 2018-02-23 DIAGNOSIS — I10 Essential (primary) hypertension: Secondary | ICD-10-CM | POA: Insufficient documentation

## 2018-02-23 DIAGNOSIS — S299XXA Unspecified injury of thorax, initial encounter: Secondary | ICD-10-CM | POA: Diagnosis present

## 2018-02-23 DIAGNOSIS — F17210 Nicotine dependence, cigarettes, uncomplicated: Secondary | ICD-10-CM | POA: Insufficient documentation

## 2018-02-23 DIAGNOSIS — F419 Anxiety disorder, unspecified: Secondary | ICD-10-CM | POA: Diagnosis not present

## 2018-02-23 DIAGNOSIS — Z79899 Other long term (current) drug therapy: Secondary | ICD-10-CM | POA: Insufficient documentation

## 2018-02-23 DIAGNOSIS — Y9389 Activity, other specified: Secondary | ICD-10-CM | POA: Diagnosis not present

## 2018-02-23 DIAGNOSIS — Z7982 Long term (current) use of aspirin: Secondary | ICD-10-CM | POA: Diagnosis not present

## 2018-02-23 DIAGNOSIS — Z9049 Acquired absence of other specified parts of digestive tract: Secondary | ICD-10-CM | POA: Diagnosis not present

## 2018-02-23 MED ORDER — KETOROLAC TROMETHAMINE 30 MG/ML IJ SOLN
15.0000 mg | Freq: Once | INTRAMUSCULAR | Status: AC
  Administered 2018-02-23: 15 mg via INTRAMUSCULAR
  Filled 2018-02-23: qty 1

## 2018-02-23 MED ORDER — CYCLOBENZAPRINE HCL 10 MG PO TABS: 10.0000 mg | ORAL_TABLET | Freq: Two times a day (BID) | ORAL | 0 refills | Status: AC | PRN

## 2018-02-23 MED ORDER — IBUPROFEN 400 MG PO TABS: 400.0000 mg | ORAL_TABLET | Freq: Three times a day (TID) | ORAL | 0 refills | Status: AC | PRN
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
## 2018-02-23 NOTE — Discharge Instructions (Signed)
Take the medications as needed for pain, try applying ice to help with swelling and discomfort, expect to be stiff and sore for the next several days.  Return as needed for concerning symptoms
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
## 2018-02-24 ENCOUNTER — Encounter: Payer: Self-pay | Admitting: Orthopaedic Surgery

## 2018-03-22 ENCOUNTER — Telehealth: Payer: Self-pay | Admitting: Orthopaedic Surgery

## 2018-03-22 NOTE — Telephone Encounter (Signed)
Patient called to ask about scheduling appointment with Dr Luna Glasgow for back pain radiating to leg, which would be a new problem. Discussed that Dr would need to review notes prior to scheduling, however, if not yet treated for this problem by primary care, then to address with primary care provider. If after treating, PCP recommends referral, Dr would then review notes and advise about scheduling.

## 2018-04-01 ENCOUNTER — Other Ambulatory Visit (HOSPITAL_COMMUNITY): Payer: Self-pay | Admitting: Family Medicine

## 2018-04-01 ENCOUNTER — Other Ambulatory Visit: Payer: Self-pay

## 2018-04-01 ENCOUNTER — Ambulatory Visit (HOSPITAL_COMMUNITY)
Admission: RE | Admit: 2018-04-01 | Discharge: 2018-04-01 | Disposition: A | Discharge status: Home / Self Care | Payer: Medicare HMO | Source: Ambulatory Visit | Attending: Family Medicine | Admitting: Family Medicine

## 2018-04-01 DIAGNOSIS — M479 Spondylosis, unspecified: Secondary | ICD-10-CM

## 2018-08-09 ENCOUNTER — Other Ambulatory Visit: Payer: Self-pay

## 2018-08-09 ENCOUNTER — Emergency Department (HOSPITAL_COMMUNITY): Payer: 59

## 2018-08-09 ENCOUNTER — Observation Stay (HOSPITAL_COMMUNITY)
Admission: EM | Admit: 2018-08-09 | Discharge: 2018-08-10 | Disposition: A | Discharge status: Home / Self Care | Payer: 59 | Attending: Internal Medicine | Admitting: Internal Medicine

## 2018-08-09 ENCOUNTER — Encounter (HOSPITAL_COMMUNITY): Payer: Self-pay | Admitting: Emergency Medicine

## 2018-08-09 DIAGNOSIS — F17210 Nicotine dependence, cigarettes, uncomplicated: Secondary | ICD-10-CM | POA: Diagnosis not present

## 2018-08-09 DIAGNOSIS — N183 Chronic kidney disease, stage 3 unspecified: Secondary | ICD-10-CM

## 2018-08-09 DIAGNOSIS — K219 Gastro-esophageal reflux disease without esophagitis: Secondary | ICD-10-CM

## 2018-08-09 DIAGNOSIS — Z20828 Contact with and (suspected) exposure to other viral communicable diseases: Secondary | ICD-10-CM | POA: Diagnosis not present

## 2018-08-09 DIAGNOSIS — Z7982 Long term (current) use of aspirin: Secondary | ICD-10-CM | POA: Insufficient documentation

## 2018-08-09 DIAGNOSIS — N182 Chronic kidney disease, stage 2 (mild): Secondary | ICD-10-CM

## 2018-08-09 DIAGNOSIS — E1122 Type 2 diabetes mellitus with diabetic chronic kidney disease: Secondary | ICD-10-CM | POA: Insufficient documentation

## 2018-08-09 DIAGNOSIS — R42 Dizziness and giddiness: Secondary | ICD-10-CM | POA: Diagnosis not present

## 2018-08-09 DIAGNOSIS — F329 Major depressive disorder, single episode, unspecified: Secondary | ICD-10-CM | POA: Insufficient documentation

## 2018-08-09 DIAGNOSIS — R0789 Other chest pain: Secondary | ICD-10-CM | POA: Diagnosis present

## 2018-08-09 DIAGNOSIS — R079 Chest pain, unspecified: Secondary | ICD-10-CM

## 2018-08-09 DIAGNOSIS — I10 Essential (primary) hypertension: Secondary | ICD-10-CM

## 2018-08-09 DIAGNOSIS — N179 Acute kidney failure, unspecified: Secondary | ICD-10-CM | POA: Diagnosis not present

## 2018-08-09 DIAGNOSIS — Z79899 Other long term (current) drug therapy: Secondary | ICD-10-CM | POA: Diagnosis not present

## 2018-08-09 DIAGNOSIS — J449 Chronic obstructive pulmonary disease, unspecified: Secondary | ICD-10-CM | POA: Insufficient documentation

## 2018-08-09 DIAGNOSIS — F419 Anxiety disorder, unspecified: Secondary | ICD-10-CM | POA: Diagnosis not present

## 2018-08-09 DIAGNOSIS — F418 Other specified anxiety disorders: Secondary | ICD-10-CM

## 2018-08-09 DIAGNOSIS — I129 Hypertensive chronic kidney disease with stage 1 through stage 4 chronic kidney disease, or unspecified chronic kidney disease: Secondary | ICD-10-CM | POA: Insufficient documentation

## 2018-08-09 LAB — COMPREHENSIVE METABOLIC PANEL
ALT: 15 U/L (ref 0–44)
AST: 15 U/L (ref 15–41)
Albumin: 5 g/dL (ref 3.5–5.0)
Alkaline Phosphatase: 89 U/L (ref 38–126)
Anion gap: 9 (ref 5–15)
BUN: 18 mg/dL (ref 6–20)
CO2: 24 mmol/L (ref 22–32)
Calcium: 10.3 mg/dL (ref 8.9–10.3)
Chloride: 106 mmol/L (ref 98–111)
Creatinine, Ser: 2.22 mg/dL — ABNORMAL HIGH (ref 0.44–1.00)
GFR calc Af Amer: 27 mL/min — ABNORMAL LOW (ref >60)
GFR calc non Af Amer: 24 mL/min — ABNORMAL LOW (ref >60)
Glucose, Bld: 94 mg/dL (ref 70–99)
Potassium: 3.9 mmol/L (ref 3.5–5.1)
Sodium: 139 mmol/L (ref 135–145)
Total Bilirubin: 0.6 mg/dL (ref 0.3–1.2)
Total Protein: 8.2 g/dL — ABNORMAL HIGH (ref 6.5–8.1)

## 2018-08-09 LAB — CBC WITH DIFFERENTIAL/PLATELET
Abs Immature Granulocytes: 0.05 10*3/uL (ref 0.00–0.07)
Basophils Absolute: 0 10*3/uL (ref 0.0–0.1)
Basophils Relative: 0 %
Eosinophils Absolute: 0.1 10*3/uL (ref 0.0–0.5)
Eosinophils Relative: 1 %
HCT: 41.8 % (ref 36.0–46.0)
Hemoglobin: 13.5 g/dL (ref 12.0–15.0)
Immature Granulocytes: 0 %
Lymphocytes Relative: 12 %
Lymphs Abs: 1.5 10*3/uL (ref 0.7–4.0)
MCH: 31.6 pg (ref 26.0–34.0)
MCHC: 32.3 g/dL (ref 30.0–36.0)
MCV: 97.9 fL (ref 80.0–100.0)
Monocytes Absolute: 0.7 10*3/uL (ref 0.1–1.0)
Monocytes Relative: 6 %
Neutro Abs: 9.9 10*3/uL — ABNORMAL HIGH (ref 1.7–7.7)
Neutrophils Relative %: 81 %
Platelets: 221 10*3/uL (ref 150–400)
RBC: 4.27 MIL/uL (ref 3.87–5.11)
RDW: 13.8 % (ref 11.5–15.5)
WBC: 12.1 10*3/uL — ABNORMAL HIGH (ref 4.0–10.5)
nRBC: 0 % (ref 0.0–0.2)

## 2018-08-09 LAB — D-DIMER, QUANTITATIVE: D-Dimer, Quant: <0.27 ug/mL-FEU (ref 0.00–0.50)

## 2018-08-09 LAB — TROPONIN I (HIGH SENSITIVITY)
Troponin I (High Sensitivity): <2 ng/L (ref <18)
Troponin I (High Sensitivity): <2 ng/L (ref <18)

## 2018-08-09 LAB — SARS CORONAVIRUS 2 BY RT PCR (HOSPITAL ORDER, PERFORMED IN ~~LOC~~ HOSPITAL LAB): SARS Coronavirus 2: NEGATIVE

## 2018-08-09 MED ORDER — HEPARIN SODIUM (PORCINE) 5000 UNIT/ML IJ SOLN
5000.0000 [IU] | Freq: Three times a day (TID) | INTRAMUSCULAR | Status: DC
  Administered 2018-08-09 – 2018-08-10 (×2): 5000 [IU] via SUBCUTANEOUS
  Filled 2018-08-09 (×2): qty 1

## 2018-08-09 MED ORDER — LIDOCAINE VISCOUS HCL 2 % MT SOLN
15.0000 mL | Freq: Once | OROMUCOSAL | Status: AC
  Administered 2018-08-09: 15 mL via ORAL
  Filled 2018-08-09: qty 15

## 2018-08-09 MED ORDER — ASPIRIN EC 81 MG PO TBEC
81.0000 mg | DELAYED_RELEASE_TABLET | ORAL | Status: DC
  Administered 2018-08-10: 81 mg via ORAL
  Filled 2018-08-09: qty 1

## 2018-08-09 MED ORDER — ONDANSETRON HCL 4 MG/2ML IJ SOLN: 4.0000 mg | Freq: Four times a day (QID) | INTRAMUSCULAR | Status: DC | PRN

## 2018-08-09 MED ORDER — LORAZEPAM 1 MG PO TABS: 1.0000 mg | ORAL_TABLET | Freq: Every day | ORAL | Status: DC | PRN

## 2018-08-09 MED ORDER — HYDROXYZINE PAMOATE 25 MG PO CAPS
25.0000 mg | ORAL_CAPSULE | Freq: Three times a day (TID) | ORAL | Status: DC
  Administered 2018-08-09: 23:00:00 25 mg via ORAL
  Filled 2018-08-09 (×5): qty 1

## 2018-08-09 MED ORDER — ACETAMINOPHEN 325 MG PO TABS
650.0000 mg | ORAL_TABLET | Freq: Four times a day (QID) | ORAL | Status: DC | PRN
  Administered 2018-08-09 – 2018-08-10 (×2): 650 mg via ORAL
  Filled 2018-08-09 (×2): qty 2

## 2018-08-09 MED ORDER — ALUM & MAG HYDROXIDE-SIMETH 200-200-20 MG/5ML PO SUSP
30.0000 mL | Freq: Once | ORAL | Status: AC
  Administered 2018-08-09: 30 mL via ORAL
  Filled 2018-08-09: qty 30

## 2018-08-09 MED ORDER — PRAVASTATIN SODIUM 10 MG PO TABS
20.0000 mg | ORAL_TABLET | Freq: Every day | ORAL | Status: DC
  Administered 2018-08-10: 20 mg via ORAL
  Filled 2018-08-09: qty 2

## 2018-08-09 MED ORDER — ACETAMINOPHEN 500 MG PO TABS
1000.0000 mg | ORAL_TABLET | Freq: Once | ORAL | Status: AC
  Administered 2018-08-09: 1000 mg via ORAL
  Filled 2018-08-09: qty 2

## 2018-08-09 MED ORDER — ALBUTEROL SULFATE (2.5 MG/3ML) 0.083% IN NEBU: 2.5000 mg | INHALATION_SOLUTION | RESPIRATORY_TRACT | Status: DC | PRN

## 2018-08-09 MED ORDER — ACETAMINOPHEN 650 MG RE SUPP: 650.0000 mg | Freq: Four times a day (QID) | RECTAL | Status: DC | PRN

## 2018-08-09 MED ORDER — VORTIOXETINE HBR 10 MG PO TABS
10.0000 mg | ORAL_TABLET | Freq: Every day | ORAL | Status: DC
  Administered 2018-08-10: 10 mg via ORAL
  Filled 2018-08-09 (×2): qty 1

## 2018-08-09 MED ORDER — ORAL CARE MOUTH RINSE
15.0000 mL | Freq: Two times a day (BID) | OROMUCOSAL | Status: DC
  Administered 2018-08-09: 23:00:00 15 mL via OROMUCOSAL

## 2018-08-09 MED ORDER — QUETIAPINE FUMARATE 100 MG PO TABS: 300.0000 mg | ORAL_TABLET | Freq: Every day | ORAL | Status: DC

## 2018-08-09 MED ORDER — ONDANSETRON HCL 4 MG PO TABS: 4.0000 mg | ORAL_TABLET | Freq: Four times a day (QID) | ORAL | Status: DC | PRN

## 2018-08-09 MED ORDER — SODIUM CHLORIDE 0.9 % IV SOLN
INTRAVENOUS | Status: DC
  Administered 2018-08-09 – 2018-08-10 (×2): via INTRAVENOUS

## 2018-08-09 MED ORDER — SODIUM CHLORIDE 0.9 % IV BOLUS
1000.0000 mL | Freq: Once | INTRAVENOUS | Status: AC
  Administered 2018-08-09: 1000 mL via INTRAVENOUS

## 2018-08-09 MED ORDER — GABAPENTIN 300 MG PO CAPS
300.0000 mg | ORAL_CAPSULE | Freq: Three times a day (TID) | ORAL | Status: DC
  Administered 2018-08-09 – 2018-08-10 (×2): 300 mg via ORAL
  Filled 2018-08-09 (×2): qty 1

## 2018-08-09 MED ORDER — OLANZAPINE 5 MG PO TABS
5.0000 mg | ORAL_TABLET | Freq: Every day | ORAL | Status: DC
  Administered 2018-08-09: 5 mg via ORAL
  Filled 2018-08-09: qty 1

## 2018-08-09 NOTE — ED Notes (Signed)
Pt ambulated to bathroom well.

## 2018-08-09 NOTE — ED Provider Notes (Signed)
[facility] EMERGENCY DEPARTMENT Provider Note   CSN: [id number] Arrival date & time: 08/09/18  1652    History   Chief Complaint Chief Complaint  Patient presents with  . Chest Pain    HPI Tanya Velazquez is a 58 y.o. female.     HPI   Tanya Velazquez is a 58 y.o. female, with a history of COPD, anxiety, GERD, HTN, DM, presenting to the ED with chest pain beginning around 3 PM today.  Patient was mowing her lawn using a riding lawnmower when she suddenly felt pain in the left anterior chest, describes it as a stabbing, waxing and waning, currently 8/10, worse with deep breathing or movement, nonradiating.  Accompanied by dizziness and some shortness of breath.   She also notes a nagging and sometimes stabbing pain in the left lateral chest present for the last 3 days. She admits to poor oral intake today.  EMS reports patient hyperventilating upon their arrival.  She had also been complaining of tingling in her bilateral upper extremities, which resolved once her breathing rate improved.  Denies fever/chills, acute cough, abdominal pain, syncope, falls/trauma, lower extremity edema/pain, or any other complaints.    Past Medical History:  Diagnosis Date  . Anxiety   . B12 deficiency anemia   . Chronic constipation   . Chronic constipation   . Chronic low back pain    lumbar radiculopathy  . COPD (chronic obstructive pulmonary disease) ([place])   . Diabetes mellitus   . DJD (degenerative joint disease)   . GERD (gastroesophageal reflux disease) 04/13/07   EGD Dr Rourk->patulous EG junction, HH, antral erosions  . H/O hiatal hernia   . Hyperplastic colon polyp 06/27/2010  . Hypertension   . Migraines   . Osteoarthritis   . Raspy voice   . Smokers' cough ([place])   . SUI (stress urinary incontinence, female)     Patient Active Problem List   Diagnosis Date Noted  . AKI (acute kidney injury) ([place]) 08/09/2018  . RUQ pain 04/10/2014  . Lower abdominal pain 04/10/2014  .  Abnormal LFTs 04/10/2014  . Intrahepatic bile duct dilation 04/10/2014  . Common bile duct dilatation 04/10/2014  . Constipation 06/11/2011  . History of gastroesophageal reflux (GERD) 06/20/2010  . Family history of colon cancer 06/20/2010    Past Surgical History:  Procedure Laterality Date  . APPENDECTOMY    . BLADDER SUSPENSION  10/07/2011   Procedure: [facility] PROCEDURE;  Surgeon: Malka So, MD;  Location: [facility];  Service: Urology;  Laterality: N/A;  1 hour requested for this case   . CARDIAC CATHETERIZATION  08-09-2004 DR Einar Gip   NORMAL CORONARY ARTERIES/  NORMAL LVF  . CARDIAC CATHETERIZATION  2006   normal coronary arteries  . COLONOSCOPY  3/31/9   Dr Rourk->friable anal canal  . COLONOSCOPY  0 06/27/10   Dr. Gala Romney hyperplastic polyp, prominent external hemorrhoid plexus. next tcs 06/2015.  . CYSTOSCOPY  10/07/2011   Procedure: CYSTOSCOPY;  Surgeon: Malka So, MD;  Location: [facility];  Service: Urology;  Laterality: N/A;  . ESOPHAGEAL MANOMETRY  2013   Dr. Derrill Kay at [facility]. incomplete bolus clearance with some breaks in contractions but there was peristalsis  . ESOPHAGOGASTRODUODENOSCOPY  06/27/10   Hiatal hernia, GERD,  . EXCISION VOLAR GANGLION LEFT WRIST  06-27-2005  . HEAD-UP TILT TABLE TEST  01-21-2007 &  11-04-2004  DR Rollene Fare   HX CHRONIC RECURRENT SYNCOPAL. EPICODES--  NEGATIVE RESULT INCLUDING ISUPREL INFUSION  .  HEMORRHOID SURGERY  09-13-2003  . LAPAROSCOPIC CHOLECYSTECTOMY  02-23-2007  . LAPAROTOMY W/ APPENDCTOMY AND LEFT SALPINGO-OOPHECTOMY  AGE 22'S  . pH/impedence  2013   Dr. Derrill Kay at [facility]. Increased reflux off PPI with excellent correlation between reflux events and regurgitation. Patient advised to hold PPI for study by Dr. Derrill Kay.   Marland Kitchen RIGHT SHOULDER ARTHROSCOPY/ DISTAL CLAVICLE RESECTION / DEBRIDEMENT ROTATOR CUFF AND LABRUM TEAR/ BURSECTOMY  06-28-2010   IMPINGEMENT SYNDROME/ AC JOINT ARTHRITIS/ ROTATOR CUFF  TENDINOPATHY  . SHOULDER ARTHROSCOPY  08-13-2010   LEFT SHOULDER IMPINGEMENT SYNDROME/ DJD OF AC JOINT  . STOMACH SURGERY  2013   antireflux surgery at [place]  age 48's     OB History    Gravida      Para      Term      Preterm      AB      Living  2     SAB      TAB      Ectopic      Multiple      Live Births               Home Medications    Prior to Admission medications   Medication Sig Start Date End Date Taking? Authorizing Provider  aspirin EC 81 MG tablet Take 81 mg by mouth every other day.     [provider]  cholecalciferol (VITAMIN D) 1000 UNITS tablet Take 1,000 Units by mouth daily.    [provider]  dexlansoprazole (DEXILANT) 60 MG capsule Take 1 capsule (60 mg total) by mouth 2 (two) times daily before a meal. 08/19/12   Annitta Needs, NP  gabapentin (NEURONTIN) 300 MG capsule Take 300 mg by mouth 3 (three) times daily.    [provider]  HYDROcodone-acetaminophen (NORCO/VICODIN) 5-325 MG tablet One tablet every four hours as needed for acute pain.  Limit of five days per [place]. 12/01/17   Sanjuana Kava, MD  hydrOXYzine (VISTARIL) 25 MG capsule Take 1 capsule by mouth 3 (three) times daily. 11/10/16   [provider]  lisinopril (PRINIVIL,ZESTRIL) 20 MG tablet Take 1 tablet by mouth daily. 01/23/17   [provider]  LORazepam (ATIVAN) 1 MG tablet Take 1 tablet by mouth daily as needed.  01/23/17   [provider]  naproxen (NAPROSYN) 500 MG tablet Take 1 tablet (500 mg total) by mouth 2 (two) times daily. 05/31/17   Nat Christen, MD  OLANZapine (ZYPREXA) 5 MG tablet Take 5 mg by mouth at bedtime.    [provider]  oxyCODONE (OXY IR/ROXICODONE) 5 MG immediate release tablet  12/08/17   [provider]  pravastatin (PRAVACHOL) 20 MG tablet Take 1 tablet by mouth daily. 12/17/12   [provider]  PROAIR HFA 108 (90 BASE) MCG/ACT  inhaler Inhale 1 puff into the lungs every 4 (four) hours as needed.  05/13/10   [provider]  QUEtiapine (SEROQUEL) 300 MG tablet Take by mouth.    [provider]  vortioxetine HBr (TRINTELLIX) 10 MG TABS tablet Take by mouth.    [provider]    Family History Family History  Problem Relation Age of Onset  . Colon cancer Mother 77  . Hypertension Mother   . Heart disease Mother 56       CABG  . Alzheimer's disease Father   . Hypertension Father   . Healthy Daughter   . Heart failure Sister   .  Heart Problems Sister        Cardiac stent    Social History Social History   Tobacco Use  . Smoking status: Current Every Day Smoker    Packs/day: 1.00    Years: 35.00    Pack years: 35.00    Types: Cigarettes  . Smokeless tobacco: Never Used  Substance Use Topics  . Alcohol use: No    Alcohol/week: 0.0 standard drinks  . Drug use: No     Allergies   Sulfa antibiotics, Amicinonide-benzyl alcohol [cyclocort], Amoxicillin, Doxycycline, Paxil [paroxetine hcl], Penicillins, and Trimethoprim   Review of Systems Review of Systems  Constitutional: Negative for chills, diaphoresis and fever.  Respiratory: Positive for shortness of breath. Negative for cough.   Cardiovascular: Positive for chest pain. Negative for palpitations and leg swelling.  Gastrointestinal: Negative for abdominal pain, blood in stool, diarrhea, nausea and vomiting.  Neurological: Positive for dizziness. Negative for syncope, weakness and numbness.  All other systems reviewed and are negative.    Physical Exam Updated Vital Signs BP 93/67 (BP Location: Right Arm)   Pulse 76   Temp 99.3 F (37.4 C) (Oral)   Resp 18   Ht 5\' 2"  (1.575 m)   Wt 68.5 kg   SpO2 97%   BMI 27.62 kg/m   Physical Exam Vitals signs and nursing note reviewed.  Constitutional:      General: She is not in acute distress.    Appearance: She is well-developed. She is not diaphoretic.  HENT:      Head: Normocephalic and atraumatic.     Mouth/Throat:     Mouth: Mucous membranes are moist.     Pharynx: Oropharynx is clear.  Eyes:     Conjunctiva/sclera: Conjunctivae normal.  Neck:     Musculoskeletal: Neck supple.  Cardiovascular:     Rate and Rhythm: Normal rate and regular rhythm.     Pulses: Normal pulses.          Radial pulses are 2+ on the right side and 2+ on the left side.       Posterior tibial pulses are 2+ on the right side and 2+ on the left side.     Heart sounds: Normal heart sounds.     Comments: Tactile temperature in the extremities appropriate and equal bilaterally. Pulmonary:     Effort: Pulmonary effort is normal. No respiratory distress.     Breath sounds: Normal breath sounds.  Chest:     Chest wall: Tenderness present. No deformity or crepitus.       Comments: Patient's chest tenderness is mostly to the left lateral ribs, as shown.  No noted deformity, instability, wounds, or swelling. She does have some mild tenderness to the sternum and left upper chest, but this tenderness seems to be minor compared to the tenderness in the left lateral. Abdominal:     Palpations: Abdomen is soft.     Tenderness: There is no abdominal tenderness. There is no guarding.  Musculoskeletal:     Right lower leg: No edema.     Left lower leg: No edema.  Lymphadenopathy:     Cervical: No cervical adenopathy.  Skin:    General: Skin is warm and dry.  Neurological:     Mental Status: She is alert.  Psychiatric:        Mood and Affect: Mood and affect normal.        Speech: Speech normal.        Behavior: Behavior normal.  ED Treatments / Results  Labs (all labs ordered are listed, but only abnormal results are displayed) Labs Reviewed  CBC WITH DIFFERENTIAL/PLATELET - Abnormal; Notable for the following components:      Result Value   WBC 12.1 (*)    Neutro Abs 9.9 (*)    All other components within normal limits  COMPREHENSIVE METABOLIC PANEL -  Abnormal; Notable for the following components:   Creatinine, Ser 2.22 (*)    Total Protein 8.2 (*)    GFR calc non Af Amer 24 (*)    GFR calc Af Amer 27 (*)    All other components within normal limits  SARS CORONAVIRUS 2 (HOSPITAL ORDER, [facility])  D-DIMER, QUANTITATIVE (NOT AT [facility])  URINALYSIS, ROUTINE W REFLEX MICROSCOPIC  TROPONIN I (HIGH SENSITIVITY)  TROPONIN I (HIGH SENSITIVITY)    BUN  Date Value Ref Range Status  08/09/2018 18 6 - 20 mg/dL Final  08/12/2015 14 6 - 20 mg/dL Final  07/01/2014 7 6 - 20 mg/dL Final  04/10/2014 8 6 - 23 mg/dL Final   Creat  Date Value Ref Range Status  04/10/2014 0.81 0.50 - 1.10 mg/dL Final   Creatinine, Ser  Date Value Ref Range Status  08/09/2018 2.22 (H) 0.44 - 1.00 mg/dL Final  08/12/2015 1.13 (H) 0.44 - 1.00 mg/dL Final  07/01/2014 1.00 0.44 - 1.00 mg/dL Final  03/27/2014 0.88 0.50 - 1.10 mg/dL Final     EKG EKG Interpretation  Date/Time:  Monday August 09 2018 17:06:49 EDT Ventricular Rate:  72 PR Interval:    QRS Duration: 81 QT Interval:  388 QTC Calculation: 425 R Axis:   38 Text Interpretation:  Sinus rhythm Low voltage, precordial leads Confirmed by Virgel Manifold [phone]) on 08/09/2018 5:27:01 PM   Radiology Dg Chest Portable 1 View  Result Date: 08/09/2018 CLINICAL DATA:  Chest pain and heavy breathing, history of COPD and smoker EXAM: PORTABLE CHEST 1 VIEW COMPARISON:  February 23, 2018 FINDINGS: The lungs are clear without focal consolidation, edema, effusion or pneumothorax. Cardiomediastinal silhouette is within normal limits for size. No acute osseous findings. IMPRESSION: No acute cardiopulmonary process. Electronically Signed   By: Prudencio Pair M.D.   On: 08/09/2018 18:07       Procedures Procedures (including critical care time)  Medications Ordered in ED Medications  sodium chloride 0.9 % bolus 1,000 mL (1,000 mLs Intravenous New Bag/Given 08/09/18 1814)  acetaminophen  (TYLENOL) tablet 1,000 mg (1,000 mg Oral Given 08/09/18 2001)     Initial Impression / Assessment and Plan / ED Course  I have reviewed the triage vital signs and the nursing notes.  Pertinent labs & imaging results that were available during my care of the patient were reviewed by me and considered in my medical decision making (see chart for details).  Clinical Course as of Aug 08 2128  Mon Aug 09, 2018  1025 Discussed findings with patient.  Patient states her central chest pain has resolved.  She complains of global headache. No neuro deficits.   [SJ]  2013 Spoke with Dr. Darrick Meigs, hospitalist. Agrees to admit the patient.   [SJ]    Clinical Course User Index [SJ] Joy, Shawn C, PA-C       Patient presents with original complaint of chest pain.  Based on the patient's description of her complaint, combined with EMS findings upon their arrival, these may be suggestive of anxiety attack.  Her pain resolved without intervention here in the ED other  than hydration. Mild leukocytosis is nonspecific in this setting.  No acute abnormalities on chest x-ray.  Delta troponin is negative.  D-dimer negative. Patient does have evidence of AKI.  She will require admission for continued hydration.  Findings and plan of care discussed with Virgel Manifold, MD.    Final Clinical Impressions(s) / ED Diagnoses   Final diagnoses:  AKI (acute kidney injury) [facility])    ED Discharge Orders    None       Layla Maw 08/09/18 2240    Virgel Manifold, MD 08/10/18 1704

## 2018-08-09 NOTE — H&P (Addendum)
TRH H&P    Patient Demographics:    Tanya Velazquez, is a 58 y.o. female  MRN: [id number]  DOB - 1961/01/09  Admit Date - 08/09/2018  Referring MD/NP/PA: Caryl Asp  Outpatient Primary MD for the patient is Lucia Gaskins, MD  Patient coming from: Home  Chief complaint-dizziness, chest pain   HPI:    Tanya Velazquez  is a 58 y.o. female, with history of COPD, anxiety, GERD, tobacco abuse, hypertension today came to hospital with complaints of chest pain, dizziness which started on 3 PM.  Patient says that from 11 AM to 3 PM she was in her lawn riding a lawnmower out in the hot sun.  Patient said that she became dizzy and did not feel good she came home and took a shower and also sat in the bathtub.  After that she started feeling dizzy but did not pass out.  Patient started having chest pain and shortness of breath.  Chest pain is located on left side.  Patient was hyperventilating as per EMS upon their arrival.  Also has been complaining of tingling in bilateral upper extremities which resolved once her breathing rate improved. She denies fever or chills. Denies nausea vomiting or diarrhea. Denies abdominal pain or dysuria. Patient still making urine. No previous history of stroke or seizures. No history of CAD  In the ED, creatinine is 2.22.  Initial troponin is negative Review of home medications - patient was taking naproxen for neuropathy which was stopped by her PCP last month.  Patient says that she took Naprosyn for at least 3 to 4 months every day.  Patient also takes lisinopril for hypertension.    Review of systems:    In addition to the HPI above,    All other systems reviewed and are negative.    Past History of the following :    Past Medical History:  Diagnosis Date  . Anxiety   . B12 deficiency anemia   . Chronic constipation   . Chronic constipation   . Chronic low back pain     lumbar radiculopathy  . COPD (chronic obstructive pulmonary disease) ([place])   . Diabetes mellitus   . DJD (degenerative joint disease)   . GERD (gastroesophageal reflux disease) 04/13/07   EGD Dr Rourk->patulous EG junction, HH, antral erosions  . H/O hiatal hernia   . Hyperplastic colon polyp 06/27/2010  . Hypertension   . Migraines   . Osteoarthritis   . Raspy voice   . Smokers' cough ([place])   . SUI (stress urinary incontinence, female)       Past Surgical History:  Procedure Laterality Date  . APPENDECTOMY    . BLADDER SUSPENSION  10/07/2011   Procedure: [facility] PROCEDURE;  Surgeon: Malka So, MD;  Location: [facility];  Service: Urology;  Laterality: N/A;  1 hour requested for this case   . CARDIAC CATHETERIZATION  08-09-2004 DR Einar Gip   NORMAL CORONARY ARTERIES/  NORMAL LVF  . CARDIAC CATHETERIZATION  2006   normal coronary arteries  . COLONOSCOPY  3/31/9  Dr Rourk->friable anal canal  . COLONOSCOPY  0 06/27/10   Dr. Gala Romney hyperplastic polyp, prominent external hemorrhoid plexus. next tcs 06/2015.  . CYSTOSCOPY  10/07/2011   Procedure: CYSTOSCOPY;  Surgeon: Malka So, MD;  Location: [facility];  Service: Urology;  Laterality: N/A;  . ESOPHAGEAL MANOMETRY  2013   Dr. Derrill Kay at [facility]. incomplete bolus clearance with some breaks in contractions but there was peristalsis  . ESOPHAGOGASTRODUODENOSCOPY  06/27/10   Hiatal hernia, GERD,  . EXCISION VOLAR GANGLION LEFT WRIST  06-27-2005  . HEAD-UP TILT TABLE TEST  01-21-2007 &  11-04-2004  DR Rollene Fare   HX CHRONIC RECURRENT SYNCOPAL. EPICODES--  NEGATIVE RESULT INCLUDING ISUPREL INFUSION  . HEMORRHOID SURGERY  09-13-2003  . LAPAROSCOPIC CHOLECYSTECTOMY  02-23-2007  . LAPAROTOMY W/ APPENDCTOMY AND LEFT SALPINGO-OOPHECTOMY  AGE 75'S  . pH/impedence  2013   Dr. Derrill Kay at [facility]. Increased reflux off PPI with excellent correlation between reflux events and regurgitation. Patient advised to hold PPI for  study by Dr. Derrill Kay.   Marland Kitchen RIGHT SHOULDER ARTHROSCOPY/ DISTAL CLAVICLE RESECTION / DEBRIDEMENT ROTATOR CUFF AND LABRUM TEAR/ BURSECTOMY  06-28-2010   IMPINGEMENT SYNDROME/ AC JOINT ARTHRITIS/ ROTATOR CUFF TENDINOPATHY  . SHOULDER ARTHROSCOPY  08-13-2010   LEFT SHOULDER IMPINGEMENT SYNDROME/ DJD OF AC JOINT  . STOMACH SURGERY  2013   antireflux surgery at [place]  age 43's      Social History:      Social History   Tobacco Use  . Smoking status: Current Every Day Smoker    Packs/day: 1.00    Years: 35.00    Pack years: 35.00    Types: Cigarettes  . Smokeless tobacco: Never Used  Substance Use Topics  . Alcohol use: No    Alcohol/week: 0.0 standard drinks       Family History :     Family History  Problem Relation Age of Onset  . Colon cancer Mother 14  . Hypertension Mother   . Heart disease Mother 60       CABG  . Alzheimer's disease Father   . Hypertension Father   . Healthy Daughter   . Heart failure Sister   . Heart Problems Sister        Cardiac stent      Home Medications:   Prior to Admission medications   Medication Sig Start Date End Date Taking? Authorizing Provider  aspirin EC 81 MG tablet Take 81 mg by mouth every other day.     [provider]  cholecalciferol (VITAMIN D) 1000 UNITS tablet Take 1,000 Units by mouth daily.    [provider]  dexlansoprazole (DEXILANT) 60 MG capsule Take 1 capsule (60 mg total) by mouth 2 (two) times daily before a meal. 08/19/12   Annitta Needs, NP  gabapentin (NEURONTIN) 300 MG capsule Take 300 mg by mouth 3 (three) times daily.    [provider]  HYDROcodone-acetaminophen (NORCO/VICODIN) 5-325 MG tablet One tablet every four hours as needed for acute pain.  Limit of five days per [place]. 12/01/17   Sanjuana Kava, MD  hydrOXYzine (VISTARIL) 25 MG capsule Take 1 capsule by mouth 3 (three) times daily. 11/10/16   [provider]  lisinopril  (PRINIVIL,ZESTRIL) 20 MG tablet Take 1 tablet by mouth daily. 01/23/17   [provider]  LORazepam (ATIVAN) 1 MG tablet Take 1 tablet by mouth daily as needed.  01/23/17   [provider]  naproxen (NAPROSYN) 500  MG tablet Take 1 tablet (500 mg total) by mouth 2 (two) times daily. 05/31/17   Nat Christen, MD  OLANZapine (ZYPREXA) 5 MG tablet Take 5 mg by mouth at bedtime.    [provider]  oxyCODONE (OXY IR/ROXICODONE) 5 MG immediate release tablet  12/08/17   [provider]  pravastatin (PRAVACHOL) 20 MG tablet Take 1 tablet by mouth daily. 12/17/12   [provider]  PROAIR HFA 108 (90 BASE) MCG/ACT inhaler Inhale 1 puff into the lungs every 4 (four) hours as needed.  05/13/10   [provider]  QUEtiapine (SEROQUEL) 300 MG tablet Take by mouth.    [provider]  vortioxetine HBr (TRINTELLIX) 10 MG TABS tablet Take by mouth.    [provider]     Allergies:     Allergies  Allergen Reactions  . Sulfa Antibiotics   . Amicinonide-Benzyl Alcohol [Cyclocort] Rash  . Amoxicillin Rash  . Doxycycline Rash  . Paxil [Paroxetine Hcl] Rash  . Penicillins Rash    Has patient had a PCN reaction causing immediate rash, facial/tongue/throat swelling, SOB or lightheadedness with hypotension: Yes Has patient had a PCN reaction causing severe rash involving mucus membranes or skin necrosis: Yes Has patient had a PCN reaction that required hospitalization: No Has patient had a PCN reaction occurring within the last 10 years: No If all of the above answers are "NO", then may proceed with Cephalosporin use.   . Trimethoprim Rash     Physical Exam:   Vitals  Blood pressure 132/70, pulse 72, temperature 99.3 F (37.4 C), temperature source Oral, resp. rate 17, height 5\' 2"  (1.575 m), weight 68.5 kg, SpO2 98 %.  1.  General: Appears in no acute distress  2. Psychiatric: Alert, oriented x3, intact insight and judgment  3.  Neurologic: Cranial nerves II through XII grossly intact, motor strength 5/5 in all extremities  4. HEENMT:  Atraumatic normocephalic, extraocular muscles are intact oral mucosa is pink and moist  5. Respiratory : Clear to auscultation bilaterally, no wheezing or crackles  6. Cardiovascular : S1-S2, regular, no murmur auscultated  7. Gastrointestinal:  Abdomen is soft, nontender, no organomegaly  8. Skin:  No rashes noted     Data Review:    CBC Recent Labs  Lab 08/09/18 1811  WBC 12.1*  HGB 13.5  HCT 41.8  PLT 221  MCV 97.9  MCH 31.6  MCHC 32.3  RDW 13.8  LYMPHSABS 1.5  MONOABS 0.7  EOSABS 0.1  BASOSABS 0.0   ------------------------------------------------------------------------------------------------------------------  Results for orders placed or performed during the hospital encounter of 08/09/18 (from the past 48 hour(s))  CBC with Differential     Status: Abnormal   Collection Time: 08/09/18  6:11 PM  Result Value Ref Range   WBC 12.1 (H) 4.0 - 10.5 K/uL   RBC 4.27 3.87 - 5.11 MIL/uL   Hemoglobin 13.5 12.0 - 15.0 g/dL   HCT 41.8 36.0 - 46.0 %   MCV 97.9 80.0 - 100.0 fL   MCH 31.6 26.0 - 34.0 pg   MCHC 32.3 30.0 - 36.0 g/dL   RDW 13.8 11.5 - 15.5 %   Platelets 221 150 - 400 K/uL   nRBC 0.0 0.0 - 0.2 %   Neutrophils Relative % 81 %   Neutro Abs 9.9 (H) 1.7 - 7.7 K/uL   Lymphocytes Relative 12 %   Lymphs Abs 1.5 0.7 - 4.0 K/uL   Monocytes Relative 6 %   Monocytes Absolute 0.7 0.1 -  1.0 K/uL   Eosinophils Relative 1 %   Eosinophils Absolute 0.1 0.0 - 0.5 K/uL   Basophils Relative 0 %   Basophils Absolute 0.0 0.0 - 0.1 K/uL   Immature Granulocytes 0 %   Abs Immature Granulocytes 0.05 0.00 - 0.07 K/uL    Comment: Performed at [facility], [address], [place], [place] [address]  Comprehensive metabolic panel     Status: Abnormal   Collection Time: 08/09/18  6:11 PM  Result Value Ref Range   Sodium 139 135 - 145 mmol/L   Potassium 3.9 3.5 -  5.1 mmol/L   Chloride 106 98 - 111 mmol/L   CO2 24 22 - 32 mmol/L   Glucose, Bld 94 70 - 99 mg/dL   BUN 18 6 - 20 mg/dL   Creatinine, Ser 2.22 (H) 0.44 - 1.00 mg/dL   Calcium 10.3 8.9 - 10.3 mg/dL   Total Protein 8.2 (H) 6.5 - 8.1 g/dL   Albumin 5.0 3.5 - 5.0 g/dL   AST 15 15 - 41 U/L   ALT 15 0 - 44 U/L   Alkaline Phosphatase 89 38 - 126 U/L   Total Bilirubin 0.6 0.3 - 1.2 mg/dL   GFR calc non Af Amer 24 (L) >60 mL/min   GFR calc Af Amer 27 (L) >60 mL/min   Anion gap 9 5 - 15    Comment: Performed at [facility], [address], [place], [place] [address]  Troponin I (High Sensitivity)     Status: None   Collection Time: 08/09/18  6:11 PM  Result Value Ref Range   Troponin I (High Sensitivity) <2.0 <18 ng/L    Comment: Performed at [facility], [address] [place], [place] [address]  D-dimer, quantitative     Status: None   Collection Time: 08/09/18  6:11 PM  Result Value Ref Range   D-Dimer, Quant <0.27 0.00 - 0.50 ug/mL-FEU    Comment: (NOTE) At the manufacturer cut-off of 0.50 ug/mL FEU, this assay has been documented to exclude PE with a sensitivity and negative predictive value of 97 to 99%.  At this time, this assay has not been approved by the FDA to exclude DVT/VTE. Results should be correlated with clinical presentation. Performed at [facility], [address] [place], [place] [address]     Chemistries  Recent Labs  Lab 08/09/18 1811  NA 139  K 3.9  CL 106  CO2 24  GLUCOSE 94  BUN 18  CREATININE 2.22*  CALCIUM 10.3  AST 15  ALT 15  ALKPHOS 89  BILITOT 0.6   ------------------------------------------------------------------------------------------------------------------  ------------------------------------------------------------------------------------------------------------------ GFR: Estimated Creatinine Clearance: 25.1 mL/min (A) (by C-G formula based on SCr of 2.22 mg/dL (H)). Liver Function Tests: Recent Labs  Lab 08/09/18 1811   AST 15  ALT 15  ALKPHOS 89  BILITOT 0.6  PROT 8.2*  ALBUMIN 5.0    --------------------------------------------------------------------------------------------------------------- Urine analysis:    Component Value Date/Time   COLORURINE YELLOW 07/01/2014 1135   APPEARANCEUR HAZY (A) 07/01/2014 1135   LABSPEC 1.015 07/01/2014 1135   PHURINE 6.5 07/01/2014 1135   GLUCOSEU NEGATIVE 07/01/2014 1135   HGBUR NEGATIVE 07/01/2014 1135   BILIRUBINUR NEGATIVE 07/01/2014 1135   KETONESUR NEGATIVE 07/01/2014 1135   PROTEINUR 30 (A) 07/01/2014 1135   UROBILINOGEN 2.0 (H) 07/01/2014 1135   NITRITE NEGATIVE 07/01/2014 1135   LEUKOCYTESUR SMALL (A) 07/01/2014 1135      Imaging Results:    Dg Chest Portable 1 View  Result Date: 08/09/2018  CLINICAL DATA:  Chest pain and heavy breathing, history of COPD and smoker EXAM: PORTABLE CHEST 1 VIEW COMPARISON:  February 23, 2018 FINDINGS: The lungs are clear without focal consolidation, edema, effusion or pneumothorax. Cardiomediastinal silhouette is within normal limits for size. No acute osseous findings. IMPRESSION: No acute cardiopulmonary process. Electronically Signed   By: Prudencio Pair M.D.   On: 08/09/2018 18:07    My personal review of EKG: Rhythm NSR, no ST changes.   Assessment & Plan:    Active Problems:   AKI (acute kidney injury) ([place])   1. Acute nonoliguric kidney injury versus CKD stage III-patient presented with creatinine of 2.22, her last creatinine is 1.13 from 2017.  No other lab available since that time.  Differential includes prerenal due to dehydration, lisinopril, NSAIDs versus renal parenchymal disease, likely drug-induced. Patient has been taking Naprosyn continuously for 3 to 4 months for neuropathy, which was recently stopped by her PCP last month.  Patient is making urine, will obtain a renal ultrasound, urine sodium, urine creatinine, urine analysis.  Avoid nephrotoxins.  Continue IV normal saline at 100 mL/h.   Follow renal function in a.m.  2. Hypertension-blood pressure stable, hold lisinopril due to above.  3. COPD-not in exacerbation, continue albuterol as needed.  4. Chest pain-likely atypical from hyperventilation/panic attack.  Resolved, EKG is unremarkable.  Initial troponin is negative.  Continue Ativan as needed.  Will cycle troponin x2.  5. We will continue other psychotropic medications including Seroquel, olanzapine, hydroxyzine   DVT Prophylaxis-   Heparin  AM Labs Ordered, also please review Full Orders  Family Communication: Admission, patients condition and plan of care including tests being ordered have been discussed with the patient  who indicate understanding and agree with the plan and Code Status.  Code Status: Full code  Admission status: Observation: Based on patients clinical presentation and evaluation of above clinical data, I have made determination that patient meets Inpatient criteria at this time.  Time spent in minutes : 60 minutes   Oswald Hillock M.D on 08/09/2018 at 8:46 PM

## 2018-08-09 NOTE — ED Triage Notes (Signed)
PT states she mowed the yard today and started feeling slightly dizzy so she came inside and took a luke warm shower but started having some left sided chest pain and was breathing heavy. PT states hx of panic attacks and states chest pain is much better and numbness and tingling to both hands is subsided upon ED arrival.

## 2018-08-10 ENCOUNTER — Observation Stay (HOSPITAL_COMMUNITY): Payer: 59

## 2018-08-10 DIAGNOSIS — I10 Essential (primary) hypertension: Secondary | ICD-10-CM | POA: Diagnosis not present

## 2018-08-10 DIAGNOSIS — R42 Dizziness and giddiness: Secondary | ICD-10-CM

## 2018-08-10 DIAGNOSIS — N183 Chronic kidney disease, stage 3 unspecified: Secondary | ICD-10-CM

## 2018-08-10 DIAGNOSIS — N182 Chronic kidney disease, stage 2 (mild): Secondary | ICD-10-CM

## 2018-08-10 DIAGNOSIS — K219 Gastro-esophageal reflux disease without esophagitis: Secondary | ICD-10-CM

## 2018-08-10 DIAGNOSIS — N179 Acute kidney failure, unspecified: Secondary | ICD-10-CM | POA: Diagnosis not present

## 2018-08-10 DIAGNOSIS — F418 Other specified anxiety disorders: Secondary | ICD-10-CM

## 2018-08-10 DIAGNOSIS — R079 Chest pain, unspecified: Secondary | ICD-10-CM | POA: Diagnosis not present

## 2018-08-10 LAB — COMPREHENSIVE METABOLIC PANEL
ALT: 11 U/L (ref 0–44)
AST: 11 U/L — ABNORMAL LOW (ref 15–41)
Albumin: 3.7 g/dL (ref 3.5–5.0)
Alkaline Phosphatase: 70 U/L (ref 38–126)
Anion gap: 7 (ref 5–15)
BUN: 21 mg/dL — ABNORMAL HIGH (ref 6–20)
CO2: 24 mmol/L (ref 22–32)
Calcium: 8.9 mg/dL (ref 8.9–10.3)
Chloride: 110 mmol/L (ref 98–111)
Creatinine, Ser: 1.92 mg/dL — ABNORMAL HIGH (ref 0.44–1.00)
GFR calc Af Amer: 33 mL/min — ABNORMAL LOW (ref >60)
GFR calc non Af Amer: 28 mL/min — ABNORMAL LOW (ref >60)
Glucose, Bld: 91 mg/dL (ref 70–99)
Potassium: 3.9 mmol/L (ref 3.5–5.1)
Sodium: 141 mmol/L (ref 135–145)
Total Bilirubin: 0.7 mg/dL (ref 0.3–1.2)
Total Protein: 6.2 g/dL — ABNORMAL LOW (ref 6.5–8.1)

## 2018-08-10 LAB — SODIUM, URINE, RANDOM: Sodium, Ur: 40 mmol/L

## 2018-08-10 LAB — URINALYSIS, ROUTINE W REFLEX MICROSCOPIC
Bilirubin Urine: NEGATIVE
Glucose, UA: NEGATIVE mg/dL
Hgb urine dipstick: NEGATIVE
Ketones, ur: NEGATIVE mg/dL
Leukocytes,Ua: NEGATIVE
Nitrite: NEGATIVE
Protein, ur: NEGATIVE mg/dL
Specific Gravity, Urine: 1.011 (ref 1.005–1.030)
pH: 5 (ref 5.0–8.0)

## 2018-08-10 LAB — CBC
HCT: 34.9 % — ABNORMAL LOW (ref 36.0–46.0)
Hemoglobin: 11.1 g/dL — ABNORMAL LOW (ref 12.0–15.0)
MCH: 31.6 pg (ref 26.0–34.0)
MCHC: 31.8 g/dL (ref 30.0–36.0)
MCV: 99.4 fL (ref 80.0–100.0)
Platelets: 184 10*3/uL (ref 150–400)
RBC: 3.51 MIL/uL — ABNORMAL LOW (ref 3.87–5.11)
RDW: 13.9 % (ref 11.5–15.5)
WBC: 6.5 10*3/uL (ref 4.0–10.5)
nRBC: 0 % (ref 0.0–0.2)

## 2018-08-10 LAB — CREATININE, URINE, RANDOM: Creatinine, Urine: 136.01 mg/dL

## 2018-08-10 MED ORDER — VORTIOXETINE HBR 10 MG PO TABS: 10.0000 mg | ORAL_TABLET | Freq: Every day | ORAL | Status: DC

## 2018-08-10 MED ORDER — HYDROXYZINE HCL 25 MG PO TABS
25.0000 mg | ORAL_TABLET | Freq: Three times a day (TID) | ORAL | Status: DC
  Administered 2018-08-10: 25 mg via ORAL
  Filled 2018-08-10: qty 1

## 2018-08-10 MED ORDER — OXYCODONE HCL 5 MG PO TABS: 5.0000 mg | ORAL_TABLET | Freq: Four times a day (QID) | ORAL | Status: DC | PRN

## 2018-08-10 MED ORDER — LISINOPRIL 20 MG PO TABS: 20.0000 mg | ORAL_TABLET | Freq: Every day | ORAL | Status: DC

## 2018-08-10 NOTE — Discharge Summary (Signed)
Physician Discharge Summary  Tanya Velazquez ZOX:[id number] DOB: February 04, 1960 DOA: 08/09/2018  PCP: Lucia Gaskins, MD  Admit date: 08/09/2018 Discharge date: 08/10/2018  Time spent: 35 minutes  Recommendations for Outpatient Follow-up:  1. Reassess blood pressure and [place]'s renal function stable 2. Repeat basic metabolic panel to follow renal function and electrolytes   Discharge Diagnoses:  AKI (acute kidney injury) ([place]) Dizziness Essential hypertension Chest pain Gastroesophageal reflux disease Depression with anxiety Chronic renal failure, stage 3 (moderate) (HCC)   Discharge Condition: Stable and improved.  Patient discharged home with instructions to follow-up with PCP in 10 days.  Diet recommendation: Heart healthy diet.  Filed Weights   08/09/18 1716  Weight: 68.5 kg    History of present illness:  As per H&P written by Dr. Darrick Meigs on 08/09/2018 58 y.o. female, with history of COPD, anxiety, GERD, tobacco abuse, hypertension today came to hospital with complaints of chest pain, dizziness which started on 3 PM.  Patient says that from 11 AM to 3 PM she was in her lawn riding a lawnmower out in the hot sun.  Patient said that she became dizzy and did not feel good she came home and took a shower and also sat in the bathtub.  After that she started feeling dizzy but did not pass out.  Patient started having chest pain and shortness of breath.  Chest pain is located on left side.  Patient was hyperventilating as per EMS upon their arrival.  Also has been complaining of tingling in bilateral upper extremities which resolved once her breathing rate improved. She denies fever or chills. Denies nausea vomiting or diarrhea. Denies abdominal pain or dysuria. Patient still making urine. No previous history of stroke or seizures. No history of CAD  In the ED, creatinine is 2.22.  Initial troponin is negative Review of home medications - patient was taking naproxen for  neuropathy which was stopped by her PCP last month.  Patient says that she took Naprosyn for at least 3 to 4 months every day.  Patient also takes lisinopril for hypertension.  Hospital Course:  1-noncardiac chest pain -Most likely associated with reflux disease and anxiety -Patient advised to continue taking PPI -Instructed to stop the use of NSAIDs over-the-counter -Continue baby aspirin, statins and her healthy diet -Troponin negative x3, no acute ischemic changes on EKG or telemetry -Resolution of chest discomfort. -Continue hydroxyzine as needed for anxiety.  2-acute on chronic renal failure in the setting of chronic kidney disease: -GFR demonstrating baseline renal failure of a stage III -Creatinine significantly improved after holding nephrotoxic agents and providing fluid resuscitation -Patient advised to stop the use of NSAIDs and will continue holding lisinopril at discharge and to follow-up with PCP -Patient instructed to keep her self well-hydrated.  3-essential hypertension -Patient blood pressure soft on admission -Stable currently without the use of antihypertensive agents -Will continue holding lisinopril at time of discharge -Patient advised to follow heart healthy diet.  4-gastroesophageal reflux disease -As mentioned above we will continue the use of PPI.  5-COPD -Good air movement bilaterally and no complaints of shortness of breath -Continue home as needed albuterol and the use trelegy Ellipta  6-depression and/anxiety -No suicidal ideation or hallucination -Continue current anxiolytic and antidepressant medications as previously prescribed.  7-HLD -continue statins.  Procedures:  See below for x-ray reports  Consultations:  None  Discharge Exam: Vitals:   08/10/18 0518 08/10/18 0522  BP: (!) 81/46 100/65  Pulse: (!) 58   Resp: 16  Temp: 98.4 F (36.9 C)   SpO2: 99%     General: Afebrile, no chest pain, no nausea, no vomiting, reports no  further chest pain and feeling stable. Cardiovascular: S1 and S2, no rubs, no gallops, no murmurs, no JVD. Respiratory: Clear to auscultation bilaterally, normal respiratory effort. Abdomen: Soft, nontender, nondistended, positive bowel sounds Extremities: No edema, no cyanosis, no clubbing.  Discharge Instructions   Discharge Instructions    Diet - low sodium heart healthy   Complete by: As directed    Discharge instructions   Complete by: As directed    Take medications as prescribed Maintain adequate hydration Avoid the use of NSAIDs Follow-up with PCP in 10 days.     Allergies as of 08/10/2018      Reactions   Amicinonide-benzyl Alcohol [cyclocort] Rash   Amoxicillin Rash   Doxycycline Rash   Paxil [paroxetine Hcl] Rash   Penicillins Rash   Has patient had a PCN reaction causing immediate rash, facial/tongue/throat swelling, SOB or lightheadedness with hypotension: Yes Has patient had a PCN reaction causing severe rash involving mucus membranes or skin necrosis: Yes Has patient had a PCN reaction that required hospitalization: No Has patient had a PCN reaction occurring within the last 10 years: No If all of the above answers are "NO", then may proceed with Cephalosporin use.   Sulfa Antibiotics Rash   Trimethoprim Rash      Medication List    TAKE these medications   aspirin EC 81 MG tablet Take 81 mg by mouth every other day.   cholecalciferol 1000 units tablet Commonly known as: VITAMIN D Take 1,000 Units by mouth daily.   cyclobenzaprine 10 MG tablet Commonly known as: FLEXERIL Take 10 mg by mouth 2 (two) times a day.   dexlansoprazole 60 MG capsule Commonly known as: DEXILANT Take 1 capsule (60 mg total) by mouth 2 (two) times daily before a meal.   gabapentin 300 MG capsule Commonly known as: NEURONTIN Take 300 mg by mouth 3 (three) times daily.   hydrOXYzine 25 MG capsule Commonly known as: VISTARIL Take 1 capsule by mouth 3 (three) times daily.    lisinopril 20 MG tablet Commonly known as: ZESTRIL Take 1 tablet (20 mg total) by mouth daily. Hold until follow up with PCP What changed: additional instructions   OLANZapine 5 MG tablet Commonly known as: ZYPREXA Take 5 mg by mouth at bedtime.   oxyCODONE 5 MG immediate release tablet Commonly known as: Oxy IR/ROXICODONE Take 1 tablet (5 mg total) by mouth every 6 (six) hours as needed for severe pain. What changed:   when to take this  reasons to take this   pravastatin 20 MG tablet Commonly known as: PRAVACHOL Take 1 tablet by mouth daily.   ProAir HFA 108 (90 Base) MCG/ACT inhaler Generic drug: albuterol Inhale 1 puff into the lungs every 4 (four) hours as needed.   QUEtiapine 400 MG tablet Commonly known as: SEROQUEL Take 400 mg by mouth at bedtime.   Trelegy Ellipta 100-62.5-25 MCG/INH Aepb Generic drug: Fluticasone-Umeclidin-Vilant Inhale 1 puff into the lungs daily.   venlafaxine XR 75 MG 24 hr capsule Commonly known as: EFFEXOR-XR Take 225 mg by mouth every morning.   vortioxetine HBr 10 MG Tabs tablet Commonly known as: Trintellix Take 1 tablet (10 mg total) by mouth daily. What changed:   how much to take  when to take this      Allergies  Allergen Reactions  . Amicinonide-Benzyl Alcohol [Cyclocort] Rash  .  Amoxicillin Rash  . Doxycycline Rash  . Paxil [Paroxetine Hcl] Rash  . Penicillins Rash    Has patient had a PCN reaction causing immediate rash, facial/tongue/throat swelling, SOB or lightheadedness with hypotension: Yes Has patient had a PCN reaction causing severe rash involving mucus membranes or skin necrosis: Yes Has patient had a PCN reaction that required hospitalization: No Has patient had a PCN reaction occurring within the last 10 years: No If all of the above answers are "NO", then may proceed with Cephalosporin use.   . Sulfa Antibiotics Rash  . Trimethoprim Rash   Follow-up Information    Lucia Gaskins, MD. Schedule  an appointment as soon as possible for a visit in 10 day(s).   Specialty: Internal Medicine Contact information: [place] [address] [phone]           The results of significant diagnostics from this hospitalization (including imaging, microbiology, ancillary and laboratory) are listed below for reference.    Significant Diagnostic Studies: US Renal  Result Date: 08/10/2018 CLINICAL DATA:  Acute renal insufficiency. EXAM: RENAL / URINARY TRACT ULTRASOUND COMPLETE COMPARISON:  None. FINDINGS: Right Kidney: Renal measurements: 12.4 x 4.1 x 5.2 cm = volume: 140.3 mL . Normal renal cortical thickness and echogenicity without focal lesions or hydronephrosis. Left Kidney: Renal measurements: 11.0 x 4.6 x 4.9 cm = volume: 129.5 mL. Normal renal cortical thickness and echogenicity without focal lesions or hydronephrosis. Bladder: Appears normal for degree of bladder distention. IMPRESSION: Unremarkable renal ultrasound examination. No worrisome renal lesions or hydronephrosis. Electronically Signed   By: Marijo Sanes M.D.   On: 08/10/2018 09:49   Dg Chest Portable 1 View  Result Date: 08/09/2018 CLINICAL DATA:  Chest pain and heavy breathing, history of COPD and smoker EXAM: PORTABLE CHEST 1 VIEW COMPARISON:  February 23, 2018 FINDINGS: The lungs are clear without focal consolidation, edema, effusion or pneumothorax. Cardiomediastinal silhouette is within normal limits for size. No acute osseous findings. IMPRESSION: No acute cardiopulmonary process. Electronically Signed   By: Prudencio Pair M.D.   On: 08/09/2018 18:07    Microbiology: Recent Results (from the past 240 hour(s))  SARS Coronavirus 2 (CEPHEID - Performed in [place] hospital lab), Hosp Order     Status: None   Collection Time: 08/09/18  7:18 PM   Specimen: Nasopharyngeal Swab  Result Value Ref Range Status   SARS Coronavirus 2 NEGATIVE NEGATIVE Final    Comment: (NOTE) If result is  NEGATIVE SARS-CoV-2 target nucleic acids are NOT DETECTED. The SARS-CoV-2 RNA is generally detectable in upper and lower  respiratory specimens during the acute phase of infection. The lowest  concentration of SARS-CoV-2 viral copies this assay can detect is 250  copies / mL. A negative result does not preclude SARS-CoV-2 infection  and should not be used as the sole basis for treatment or other  patient management decisions.  A negative result may occur with  improper specimen collection / handling, submission of specimen other  than nasopharyngeal swab, presence of viral mutation(s) within the  areas targeted by this assay, and inadequate number of viral copies  (<250 copies / mL). A negative result must be combined with clinical  observations, patient history, and epidemiological information. If result is POSITIVE SARS-CoV-2 target nucleic acids are DETECTED. The SARS-CoV-2 RNA is generally detectable in upper and lower  respiratory specimens dur ing the acute phase of infection.  Positive  results are indicative of active infection with SARS-CoV-2.  Clinical  correlation with  patient history and other diagnostic information is  necessary to determine patient infection status.  Positive results do  not rule out bacterial infection or co-infection with other viruses. If result is PRESUMPTIVE POSTIVE SARS-CoV-2 nucleic acids MAY BE PRESENT.   A presumptive positive result was obtained on the submitted specimen  and confirmed on repeat testing.  While 2019 novel coronavirus  (SARS-CoV-2) nucleic acids may be present in the submitted sample  additional confirmatory testing may be necessary for epidemiological  and / or clinical management purposes  to differentiate between  SARS-CoV-2 and other Sarbecovirus currently known to infect humans.  If clinically indicated additional testing with an alternate test  methodology [phone]) is advised. The SARS-CoV-2 RNA is generally  detectable  in upper and lower respiratory sp ecimens during the acute  phase of infection. The expected result is Negative. Fact Sheet for Patients:  StrictlyIdeas.no Fact Sheet for Healthcare Providers: BankingDealers.co.za This test is not yet approved or cleared by the [place] FDA and has been authorized for detection and/or diagnosis of SARS-CoV-2 by FDA under an Emergency Use Authorization (EUA).  This EUA will remain in effect (meaning this test can be used) for the duration of the COVID-19 declaration under Section 564(b)(1) of the Act, 21 U.S.C. section 360bbb-3(b)(1), unless the authorization is terminated or revoked sooner. Performed at [facility], [address] [place], [place] [address]      Labs: Basic Metabolic Panel: Recent Labs  Lab 08/09/18 1811 08/10/18 0437  NA 139 141  K 3.9 3.9  CL 106 110  CO2 24 24  GLUCOSE 94 91  BUN 18 21*  CREATININE 2.22* 1.92*  CALCIUM 10.3 8.9   Liver Function Tests: Recent Labs  Lab 08/09/18 1811 08/10/18 0437  AST 15 11*  ALT 15 11  ALKPHOS 89 70  BILITOT 0.6 0.7  PROT 8.2* 6.2*  ALBUMIN 5.0 3.7   CBC: Recent Labs  Lab 08/09/18 1811 08/10/18 0437  WBC 12.1* 6.5  NEUTROABS 9.9*  --   HGB 13.5 11.1*  HCT 41.8 34.9*  MCV 97.9 99.4  PLT 221 184    Signed:  Barton Dubois MD.  [facility] 08/10/2018, 12:07 PM

## 2018-08-10 NOTE — Progress Notes (Signed)
Nsg Discharge Note  Admit Date:  08/09/2018 Discharge date: 08/10/2018   Tanya Velazquez to be D/C'd Home per MD order.  AVS completed.  Copy for chart, and copy for patient signed, and dated. Patient/caregiver able to verbalize understanding.  Discharge Medication: Allergies as of 08/10/2018      Reactions   Amicinonide-benzyl Alcohol [cyclocort] Rash   Amoxicillin Rash   Doxycycline Rash   Paxil [paroxetine Hcl] Rash   Penicillins Rash   Has patient had a PCN reaction causing immediate rash, facial/tongue/throat swelling, SOB or lightheadedness with hypotension: Yes Has patient had a PCN reaction causing severe rash involving mucus membranes or skin necrosis: Yes Has patient had a PCN reaction that required hospitalization: No Has patient had a PCN reaction occurring within the last 10 years: No If all of the above answers are "NO", then may proceed with Cephalosporin use.   Sulfa Antibiotics Rash   Trimethoprim Rash      Medication List    TAKE these medications   aspirin EC 81 MG tablet Take 81 mg by mouth every other day.   cholecalciferol 1000 units tablet Commonly known as: VITAMIN D Take 1,000 Units by mouth daily.   cyclobenzaprine 10 MG tablet Commonly known as: FLEXERIL Take 10 mg by mouth 2 (two) times a day.   dexlansoprazole 60 MG capsule Commonly known as: DEXILANT Take 1 capsule (60 mg total) by mouth 2 (two) times daily before a meal.   gabapentin 300 MG capsule Commonly known as: NEURONTIN Take 300 mg by mouth 3 (three) times daily.   hydrOXYzine 25 MG capsule Commonly known as: VISTARIL Take 1 capsule by mouth 3 (three) times daily.   lisinopril 20 MG tablet Commonly known as: ZESTRIL Take 1 tablet (20 mg total) by mouth daily. Hold until follow up with PCP What changed: additional instructions   OLANZapine 5 MG tablet Commonly known as: ZYPREXA Take 5 mg by mouth at bedtime.   oxyCODONE 5 MG immediate release tablet Commonly known as: Oxy  IR/ROXICODONE Take 1 tablet (5 mg total) by mouth every 6 (six) hours as needed for severe pain. What changed:   when to take this  reasons to take this   pravastatin 20 MG tablet Commonly known as: PRAVACHOL Take 1 tablet by mouth daily.   ProAir HFA 108 (90 Base) MCG/ACT inhaler Generic drug: albuterol Inhale 1 puff into the lungs every 4 (four) hours as needed.   QUEtiapine 400 MG tablet Commonly known as: SEROQUEL Take 400 mg by mouth at bedtime.   Trelegy Ellipta 100-62.5-25 MCG/INH Aepb Generic drug: Fluticasone-Umeclidin-Vilant Inhale 1 puff into the lungs daily.   venlafaxine XR 75 MG 24 hr capsule Commonly known as: EFFEXOR-XR Take 225 mg by mouth every morning.   vortioxetine HBr 10 MG Tabs tablet Commonly known as: Trintellix Take 1 tablet (10 mg total) by mouth daily. What changed:   how much to take  when to take this       Discharge Assessment: Vitals:   08/10/18 0518 08/10/18 0522  BP: (!) 81/46 100/65  Pulse: (!) 58   Resp: 16   Temp: 98.4 F (36.9 C)   SpO2: 99%    Skin clean, dry and intact without evidence of skin break down, no evidence of skin tears noted. IV catheter discontinued intact. Site without signs and symptoms of complications - no redness or edema noted at insertion site, patient denies c/o pain - only slight tenderness at site.  Dressing with slight pressure  applied.  D/c Instructions-Education: Discharge instructions given to patient/family with verbalized understanding. D/c education completed with patient/family including follow up instructions, medication list, d/c activities limitations if indicated, with other d/c instructions as indicated by MD - patient able to verbalize understanding, all questions fully answered. Patient instructed to return to ED, call 911, or call MD for any changes in condition.  Patient escorted via [place], and D/C home via private auto.  Loa Socks, RN 08/10/2018 12:26 PM

## 2018-08-11 LAB — HIV ANTIBODY (ROUTINE TESTING W REFLEX): HIV Screen 4th Generation wRfx: NONREACTIVE

## 2018-09-13 ENCOUNTER — Ambulatory Visit (INDEPENDENT_AMBULATORY_CARE_PROVIDER_SITE_OTHER): Payer: 59 | Admitting: Otolaryngology

## 2018-10-07 ENCOUNTER — Emergency Department (HOSPITAL_COMMUNITY): Payer: Medicare Other

## 2018-10-07 ENCOUNTER — Observation Stay (HOSPITAL_COMMUNITY)
Admission: EM | Admit: 2018-10-07 | Discharge: 2018-10-10 | Disposition: A | Discharge status: Home / Self Care | Payer: Medicare Other | Attending: Family Medicine | Admitting: Family Medicine

## 2018-10-07 ENCOUNTER — Other Ambulatory Visit: Payer: Self-pay

## 2018-10-07 ENCOUNTER — Encounter (HOSPITAL_COMMUNITY): Payer: Self-pay | Admitting: Emergency Medicine

## 2018-10-07 DIAGNOSIS — R2689 Other abnormalities of gait and mobility: Secondary | ICD-10-CM | POA: Insufficient documentation

## 2018-10-07 DIAGNOSIS — J449 Chronic obstructive pulmonary disease, unspecified: Secondary | ICD-10-CM | POA: Insufficient documentation

## 2018-10-07 DIAGNOSIS — Z882 Allergy status to sulfonamides status: Secondary | ICD-10-CM | POA: Diagnosis not present

## 2018-10-07 DIAGNOSIS — G8929 Other chronic pain: Secondary | ICD-10-CM | POA: Insufficient documentation

## 2018-10-07 DIAGNOSIS — R42 Dizziness and giddiness: Secondary | ICD-10-CM | POA: Insufficient documentation

## 2018-10-07 DIAGNOSIS — Z79899 Other long term (current) drug therapy: Secondary | ICD-10-CM | POA: Diagnosis not present

## 2018-10-07 DIAGNOSIS — W19XXXA Unspecified fall, initial encounter: Secondary | ICD-10-CM | POA: Diagnosis not present

## 2018-10-07 DIAGNOSIS — M6281 Muscle weakness (generalized): Secondary | ICD-10-CM | POA: Diagnosis not present

## 2018-10-07 DIAGNOSIS — Z20828 Contact with and (suspected) exposure to other viral communicable diseases: Secondary | ICD-10-CM | POA: Diagnosis not present

## 2018-10-07 DIAGNOSIS — E785 Hyperlipidemia, unspecified: Secondary | ICD-10-CM | POA: Insufficient documentation

## 2018-10-07 DIAGNOSIS — I10 Essential (primary) hypertension: Secondary | ICD-10-CM | POA: Diagnosis not present

## 2018-10-07 DIAGNOSIS — Z880 Allergy status to penicillin: Secondary | ICD-10-CM | POA: Insufficient documentation

## 2018-10-07 DIAGNOSIS — R55 Syncope and collapse: Secondary | ICD-10-CM | POA: Diagnosis not present

## 2018-10-07 DIAGNOSIS — Z23 Encounter for immunization: Secondary | ICD-10-CM | POA: Insufficient documentation

## 2018-10-07 DIAGNOSIS — F17210 Nicotine dependence, cigarettes, uncomplicated: Secondary | ICD-10-CM | POA: Diagnosis not present

## 2018-10-07 DIAGNOSIS — Z9181 History of falling: Secondary | ICD-10-CM | POA: Diagnosis not present

## 2018-10-07 DIAGNOSIS — S92301A Fracture of unspecified metatarsal bone(s), right foot, initial encounter for closed fracture: Secondary | ICD-10-CM | POA: Insufficient documentation

## 2018-10-07 DIAGNOSIS — E119 Type 2 diabetes mellitus without complications: Secondary | ICD-10-CM | POA: Diagnosis not present

## 2018-10-07 DIAGNOSIS — S92309A Fracture of unspecified metatarsal bone(s), unspecified foot, initial encounter for closed fracture: Secondary | ICD-10-CM | POA: Diagnosis present

## 2018-10-07 DIAGNOSIS — K219 Gastro-esophageal reflux disease without esophagitis: Secondary | ICD-10-CM | POA: Diagnosis not present

## 2018-10-07 LAB — URINALYSIS, ROUTINE W REFLEX MICROSCOPIC
Bilirubin Urine: NEGATIVE
Glucose, UA: NEGATIVE mg/dL
Hgb urine dipstick: NEGATIVE
Ketones, ur: NEGATIVE mg/dL
Leukocytes,Ua: NEGATIVE
Nitrite: NEGATIVE
Protein, ur: NEGATIVE mg/dL
Specific Gravity, Urine: 1.006 (ref 1.005–1.030)
pH: 6 (ref 5.0–8.0)

## 2018-10-07 LAB — BASIC METABOLIC PANEL
Anion gap: 12 (ref 5–15)
BUN: 16 mg/dL (ref 6–20)
CO2: 25 mmol/L (ref 22–32)
Calcium: 10.9 mg/dL — ABNORMAL HIGH (ref 8.9–10.3)
Chloride: 96 mmol/L — ABNORMAL LOW (ref 98–111)
Creatinine, Ser: 1.25 mg/dL — ABNORMAL HIGH (ref 0.44–1.00)
GFR calc Af Amer: 55 mL/min — ABNORMAL LOW (ref >60)
GFR calc non Af Amer: 47 mL/min — ABNORMAL LOW (ref >60)
Glucose, Bld: 172 mg/dL — ABNORMAL HIGH (ref 70–99)
Potassium: 3.9 mmol/L (ref 3.5–5.1)
Sodium: 133 mmol/L — ABNORMAL LOW (ref 135–145)

## 2018-10-07 LAB — CBC
HCT: 44.4 % (ref 36.0–46.0)
Hemoglobin: 14.2 g/dL (ref 12.0–15.0)
MCH: 31.7 pg (ref 26.0–34.0)
MCHC: 32 g/dL (ref 30.0–36.0)
MCV: 99.1 fL (ref 80.0–100.0)
Platelets: 242 10*3/uL (ref 150–400)
RBC: 4.48 MIL/uL (ref 3.87–5.11)
RDW: 13.2 % (ref 11.5–15.5)
WBC: 9.1 10*3/uL (ref 4.0–10.5)
nRBC: 0 % (ref 0.0–0.2)

## 2018-10-07 LAB — D-DIMER, QUANTITATIVE: D-Dimer, Quant: <0.27 ug/mL-FEU (ref 0.00–0.50)

## 2018-10-07 MED ORDER — ONDANSETRON HCL 4 MG/2ML IJ SOLN: 4.0000 mg | Freq: Four times a day (QID) | INTRAMUSCULAR | Status: DC | PRN

## 2018-10-07 MED ORDER — VENLAFAXINE HCL ER 75 MG PO CP24
225.0000 mg | ORAL_CAPSULE | Freq: Every morning | ORAL | Status: DC
  Administered 2018-10-09 – 2018-10-10 (×2): 225 mg via ORAL
  Filled 2018-10-07 (×2): qty 3

## 2018-10-07 MED ORDER — ALBUTEROL SULFATE HFA 108 (90 BASE) MCG/ACT IN AERS
1.0000 | INHALATION_SPRAY | RESPIRATORY_TRACT | Status: DC | PRN
  Filled 2018-10-07: qty 6.7

## 2018-10-07 MED ORDER — SODIUM CHLORIDE 0.9 % IV SOLN
INTRAVENOUS | Status: AC
  Administered 2018-10-08: via INTRAVENOUS

## 2018-10-07 MED ORDER — ENOXAPARIN SODIUM 40 MG/0.4ML ~~LOC~~ SOLN
40.0000 mg | SUBCUTANEOUS | Status: DC
  Administered 2018-10-08: 40 mg via SUBCUTANEOUS
  Filled 2018-10-07: qty 0.4

## 2018-10-07 MED ORDER — ACETAMINOPHEN 325 MG PO TABS
650.0000 mg | ORAL_TABLET | Freq: Four times a day (QID) | ORAL | Status: DC | PRN
  Administered 2018-10-08: 650 mg via ORAL
  Filled 2018-10-07 (×3): qty 2

## 2018-10-07 MED ORDER — LISINOPRIL 10 MG PO TABS
20.0000 mg | ORAL_TABLET | Freq: Every day | ORAL | Status: DC
  Filled 2018-10-07: qty 2

## 2018-10-07 MED ORDER — HYDROCODONE-ACETAMINOPHEN 5-325 MG PO TABS
1.0000 | ORAL_TABLET | Freq: Once | ORAL | Status: AC
  Administered 2018-10-07: 21:00:00 1 via ORAL
  Filled 2018-10-07: qty 1

## 2018-10-07 MED ORDER — VITAMIN D 25 MCG (1000 UNIT) PO TABS
1000.0000 [IU] | ORAL_TABLET | Freq: Every day | ORAL | Status: DC
  Administered 2018-10-08 – 2018-10-09 (×2): 1000 [IU] via ORAL
  Filled 2018-10-07 (×6): qty 1

## 2018-10-07 MED ORDER — HYDROMORPHONE HCL 1 MG/ML IJ SOLN
0.5000 mg | INTRAMUSCULAR | Status: DC | PRN
  Administered 2018-10-08 – 2018-10-10 (×6): 0.5 mg via INTRAVENOUS
  Filled 2018-10-07 (×2): qty 0.5
  Filled 2018-10-07 (×2): qty 1
  Filled 2018-10-07 (×2): qty 0.5

## 2018-10-07 MED ORDER — FLUTICASONE-UMECLIDIN-VILANT 100-62.5-25 MCG/INH IN AEPB: 1.0000 | INHALATION_SPRAY | Freq: Every day | RESPIRATORY_TRACT | Status: DC

## 2018-10-07 MED ORDER — OLANZAPINE 5 MG PO TABS
5.0000 mg | ORAL_TABLET | Freq: Every day | ORAL | Status: DC
  Administered 2018-10-08 – 2018-10-09 (×3): 5 mg via ORAL
  Filled 2018-10-07 (×3): qty 1

## 2018-10-07 MED ORDER — QUETIAPINE FUMARATE 100 MG PO TABS
400.0000 mg | ORAL_TABLET | Freq: Every day | ORAL | Status: DC
  Administered 2018-10-08 – 2018-10-09 (×3): 400 mg via ORAL
  Filled 2018-10-07 (×3): qty 4

## 2018-10-07 MED ORDER — GABAPENTIN 300 MG PO CAPS
300.0000 mg | ORAL_CAPSULE | Freq: Three times a day (TID) | ORAL | Status: DC
  Administered 2018-10-08 – 2018-10-10 (×7): 300 mg via ORAL
  Filled 2018-10-07 (×7): qty 1

## 2018-10-07 MED ORDER — SODIUM CHLORIDE 0.9% FLUSH
3.0000 mL | Freq: Once | INTRAVENOUS | Status: AC
  Administered 2018-10-07: 3 mL via INTRAVENOUS

## 2018-10-07 MED ORDER — ACETAMINOPHEN 650 MG RE SUPP: 650.0000 mg | Freq: Four times a day (QID) | RECTAL | Status: DC | PRN

## 2018-10-07 MED ORDER — SODIUM CHLORIDE 0.9 % IV BOLUS
1000.0000 mL | Freq: Once | INTRAVENOUS | Status: AC
  Administered 2018-10-07: 23:00:00 1000 mL via INTRAVENOUS

## 2018-10-07 MED ORDER — FENTANYL CITRATE (PF) 100 MCG/2ML IJ SOLN
50.0000 ug | Freq: Once | INTRAMUSCULAR | Status: AC
  Administered 2018-10-08: 50 ug via INTRAVENOUS
  Filled 2018-10-07: qty 2

## 2018-10-07 MED ORDER — ONDANSETRON HCL 4 MG/2ML IJ SOLN
4.0000 mg | Freq: Once | INTRAMUSCULAR | Status: AC
  Administered 2018-10-08: 4 mg via INTRAVENOUS
  Filled 2018-10-07: qty 2

## 2018-10-07 MED ORDER — PRAVASTATIN SODIUM 10 MG PO TABS
20.0000 mg | ORAL_TABLET | Freq: Every morning | ORAL | Status: DC
  Administered 2018-10-09 – 2018-10-10 (×2): 20 mg via ORAL
  Filled 2018-10-07: qty 2
  Filled 2018-10-07: qty 1
  Filled 2018-10-07 (×4): qty 2

## 2018-10-07 NOTE — ED Provider Notes (Signed)
[facility] EMERGENCY DEPARTMENT Provider Note   CSN: [id number] Arrival date & time: 10/07/18  1359     History   Chief Complaint Chief Complaint  Patient presents with  . Fall    HPI Tanya Velazquez is a 58 y.o. female with a history of COPD, type 2 diabetes, chronic low back pain, GERD, hypertension, migraine headaches and osteoarthritis with a distant history of episodic syncope of unclear etiology, developed lightheadedness followed by syncope yesterday evening while she was at home.  She describes walking from her bedroom to her kitchen to get a snack, felt lightheaded but kept walking and had a syncopal event in her kitchen.  She has had significant pain in her right foot since this event.  She also hit her posterior head during the injury and has kept a headache which has not responded to Tylenol.  She was seen by an urgent care center today and an x-ray of her foot showed significant fractures of her second third and fourth metatarsals of her right foot.  She was referred here for further evaluation of this injury and her syncope.  She denies chest pain, palpitations, she does endorse shortness of breath which is somewhat chronic with her COPD, but worse over the past several days.  She has had no fevers, chills or increased cough, she does endorse of early morning smoker's cough which generally resolves.  She also denies palpitations, no nausea, vomiting, abdominal pain, no ear pain, tinnitus, does not describe vertigo.  She has had persistent lightheadedness with standing and walking throughout today.  She denies any reasons for dehydration.  She has found no alleviators for her symptoms.     The history is provided by the patient.    Past Medical History:  Diagnosis Date  . Anxiety   . B12 deficiency anemia   . Chronic constipation   . Chronic constipation   . Chronic low back pain    lumbar radiculopathy  . COPD (chronic obstructive pulmonary disease) ([place])   . Diabetes  mellitus   . DJD (degenerative joint disease)   . GERD (gastroesophageal reflux disease) 04/13/07   EGD Dr Rourk->patulous EG junction, HH, antral erosions  . H/O hiatal hernia   . Hyperplastic colon polyp 06/27/2010  . Hypertension   . Migraines   . Osteoarthritis   . Raspy voice   . Smokers' cough ([place])   . SUI (stress urinary incontinence, female)     Patient Active Problem List   Diagnosis Date Noted  . Dizziness   . Essential hypertension   . Chest pain   . Gastroesophageal reflux disease   . Depression with anxiety   . CKD (chronic kidney disease), symptom management only, stage 2 (mild)   . Chronic renal failure, stage 3 (moderate) (HCC)   . AKI (acute kidney injury) ([place]) 08/09/2018  . RUQ pain 04/10/2014  . Lower abdominal pain 04/10/2014  . Abnormal LFTs 04/10/2014  . Intrahepatic bile duct dilation 04/10/2014  . Common bile duct dilatation 04/10/2014  . Constipation 06/11/2011  . History of gastroesophageal reflux (GERD) 06/20/2010  . Family history of colon cancer 06/20/2010    Past Surgical History:  Procedure Laterality Date  . APPENDECTOMY    . BLADDER SUSPENSION  10/07/2011   Procedure: [facility] PROCEDURE;  Surgeon: Malka So, MD;  Location: [facility];  Service: Urology;  Laterality: N/A;  1 hour requested for this case   . CARDIAC CATHETERIZATION  08-09-2004 DR Einar Gip   NORMAL  CORONARY ARTERIES/  NORMAL LVF  . CARDIAC CATHETERIZATION  2006   normal coronary arteries  . COLONOSCOPY  3/31/9   Dr Rourk->friable anal canal  . COLONOSCOPY  0 06/27/10   Dr. Gala Romney hyperplastic polyp, prominent external hemorrhoid plexus. next tcs 06/2015.  . CYSTOSCOPY  10/07/2011   Procedure: CYSTOSCOPY;  Surgeon: Malka So, MD;  Location: [facility];  Service: Urology;  Laterality: N/A;  . ESOPHAGEAL MANOMETRY  2013   Dr. Derrill Kay at [facility]. incomplete bolus clearance with some breaks in contractions but there was peristalsis  .  ESOPHAGOGASTRODUODENOSCOPY  06/27/10   Hiatal hernia, GERD,  . EXCISION VOLAR GANGLION LEFT WRIST  06-27-2005  . HEAD-UP TILT TABLE TEST  01-21-2007 &  11-04-2004  DR Rollene Fare   HX CHRONIC RECURRENT SYNCOPAL. EPICODES--  NEGATIVE RESULT INCLUDING ISUPREL INFUSION  . HEMORRHOID SURGERY  09-13-2003  . LAPAROSCOPIC CHOLECYSTECTOMY  02-23-2007  . LAPAROTOMY W/ APPENDCTOMY AND LEFT SALPINGO-OOPHECTOMY  AGE 2'S  . pH/impedence  2013   Dr. Derrill Kay at [facility]. Increased reflux off PPI with excellent correlation between reflux events and regurgitation. Patient advised to hold PPI for study by Dr. Derrill Kay.   Marland Kitchen RIGHT SHOULDER ARTHROSCOPY/ DISTAL CLAVICLE RESECTION / DEBRIDEMENT ROTATOR CUFF AND LABRUM TEAR/ BURSECTOMY  06-28-2010   IMPINGEMENT SYNDROME/ AC JOINT ARTHRITIS/ ROTATOR CUFF TENDINOPATHY  . SHOULDER ARTHROSCOPY  08-13-2010   LEFT SHOULDER IMPINGEMENT SYNDROME/ DJD OF AC JOINT  . STOMACH SURGERY  2013   antireflux surgery at [facility]  age 1's     OB History    Gravida      Para      Term      Preterm      AB      Living  2     SAB      TAB      Ectopic      Multiple      Live Births               Home Medications    Prior to Admission medications   Medication Sig Start Date End Date Taking? Authorizing Provider  cholecalciferol (VITAMIN D) 1000 UNITS tablet Take 1,000 Units by mouth daily.   Yes [provider]  gabapentin (NEURONTIN) 300 MG capsule Take 300 mg by mouth 3 (three) times daily.   Yes [provider]  lisinopril (ZESTRIL) 20 MG tablet Take 1 tablet (20 mg total) by mouth daily. Hold until follow up with PCP Patient taking differently: Take 20 mg by mouth daily.  08/10/18  Yes Barton Dubois, MD  OLANZapine (ZYPREXA) 5 MG tablet Take 5 mg by mouth at bedtime.   Yes [provider]  oxyCODONE (OXY IR/ROXICODONE) 5 MG immediate release tablet Take 1 tablet (5 mg total) by mouth every 6 (six) hours as  needed for severe pain. 08/10/18  Yes Barton Dubois, MD  pravastatin (PRAVACHOL) 20 MG tablet Take 1 tablet by mouth every morning.  12/17/12  Yes [provider]  PROAIR HFA 108 (90 BASE) MCG/ACT inhaler Inhale 1 puff into the lungs every 4 (four) hours as needed for wheezing or shortness of breath.  05/13/10  Yes [provider]  QUEtiapine (SEROQUEL) 400 MG tablet Take 400 mg by mouth at bedtime. 02/25/18  Yes [provider]  TRELEGY ELLIPTA 100-62.5-25 MCG/INH AEPB Inhale 1 puff into the lungs daily. 03/11/18  Yes [provider]  venlafaxine XR (EFFEXOR-XR) 75 MG 24 hr capsule Take  225 mg by mouth every morning.   Yes [provider]  vortioxetine HBr (TRINTELLIX) 10 MG TABS tablet Take 1 tablet (10 mg total) by mouth daily. Patient not taking: Reported on 10/07/2018 08/10/18   Barton Dubois, MD    Family History Family History  Problem Relation Age of Onset  . Colon cancer Mother 103  . Hypertension Mother   . Heart disease Mother 54       CABG  . Alzheimer's disease Father   . Hypertension Father   . Healthy Daughter   . Heart failure Sister   . Heart Problems Sister        Cardiac stent    Social History Social History   Tobacco Use  . Smoking status: Current Every Day Smoker    Packs/day: 1.00    Years: 35.00    Pack years: 35.00    Types: Cigarettes  . Smokeless tobacco: Never Used  Substance Use Topics  . Alcohol use: No    Alcohol/week: 0.0 standard drinks  . Drug use: No     Allergies   Amicinonide-benzyl alcohol [cyclocort], Amoxicillin, Doxycycline, Paxil [paroxetine hcl], Penicillins, Sulfa antibiotics, and Trimethoprim   Review of Systems Review of Systems  Constitutional: Negative for chills and fever.  HENT: Negative for congestion, ear pain, sore throat and tinnitus.   Eyes: Negative.   Respiratory: Positive for cough and shortness of breath. Negative for chest tightness.   Cardiovascular: Negative for  chest pain.  Gastrointestinal: Negative for abdominal pain, nausea and vomiting.  Genitourinary: Negative.   Musculoskeletal: Positive for arthralgias. Negative for joint swelling and neck pain.  Skin: Negative.  Negative for rash and wound.  Neurological: Positive for light-headedness and headaches. Negative for dizziness, weakness and numbness.  Psychiatric/Behavioral: Negative.      Physical Exam Updated Vital Signs BP 120/72   Pulse 64   Temp 98.2 F (36.8 C) (Oral)   Resp 18   Ht 5\' 2"  (1.575 m)   Wt 67.1 kg   SpO2 97%   BMI 27.07 kg/m   Physical Exam Vitals signs and nursing note reviewed.  Constitutional:      Appearance: She is well-developed.  HENT:     Head: Normocephalic and atraumatic.     Right Ear: Tympanic membrane normal.     Left Ear: Tympanic membrane normal.  Eyes:     Extraocular Movements: Extraocular movements intact.     Conjunctiva/sclera: Conjunctivae normal.     Pupils: Pupils are equal, round, and reactive to light.  Neck:     Musculoskeletal: Normal range of motion.  Cardiovascular:     Rate and Rhythm: Normal rate and regular rhythm.     Heart sounds: Normal heart sounds.  Pulmonary:     Effort: Pulmonary effort is normal. No respiratory distress.     Breath sounds: Normal breath sounds. No wheezing.     Comments: Distant breath sounds throughout. Abdominal:     General: Bowel sounds are normal. There is no distension.     Palpations: Abdomen is soft.     Tenderness: There is no abdominal tenderness. There is no guarding.  Musculoskeletal: Normal range of motion.     Right foot: Normal range of motion and normal capillary refill. Bony tenderness and swelling present.  Skin:    General: Skin is warm and dry.  Neurological:     Mental Status: She is alert.      ED Treatments / Results  Labs (all labs ordered are listed, but  only abnormal results are displayed) Labs Reviewed  BASIC METABOLIC PANEL - Abnormal; Notable for the  following components:      Result Value   Sodium 133 (*)    Chloride 96 (*)    Glucose, Bld 172 (*)    Creatinine, Ser 1.25 (*)    Calcium 10.9 (*)    GFR calc non Af Amer 47 (*)    GFR calc Af Amer 55 (*)    All other components within normal limits  URINALYSIS, ROUTINE W REFLEX MICROSCOPIC - Abnormal; Notable for the following components:   Color, Urine STRAW (*)    All other components within normal limits  CBC  D-DIMER, QUANTITATIVE (NOT AT [facility])    Orthostatic VS for the past 24 hrs:  BP- Lying Pulse- Lying BP- Sitting Pulse- Sitting BP- Standing at 0 minutes Pulse- Standing at 0 minutes  10/07/18 2046 103/68 76 111/67 85 91/67 89       EKG None  Radiology Dg Chest 2 View  Result Date: 10/07/2018 CLINICAL DATA:  Shortness of breath. EXAM: CHEST - 2 VIEW COMPARISON:  Radiograph 08/09/2018 FINDINGS: The cardiomediastinal contours are normal. Chronic hyperinflation. Minimal subsegmental bibasilar atelectasis or scarring. Pulmonary vasculature is normal. No consolidation, pleural effusion, or pneumothorax. No acute osseous abnormalities are seen. IMPRESSION: 1. No acute abnormality. 2. Chronic hyperinflation. Electronically Signed   By: Keith Rake M.D.   On: 10/07/2018 21:57   Ct Head Wo Contrast  Result Date: 10/07/2018 CLINICAL DATA:  Last night pt got dizzy, passed out and fell. Broke 2 toes on RT foot. Sent by [place] center for evaul of syncopal episode. EXAM: CT HEAD WITHOUT CONTRAST TECHNIQUE: Contiguous axial images were obtained from the base of the skull through the vertex without intravenous contrast. COMPARISON:  None. FINDINGS: Brain: No evidence of acute infarction, hemorrhage, hydrocephalus, extra-axial collection or mass lesion/mass effect. Vascular: No hyperdense vessel or unexpected calcification. Skull: Normal. Negative for fracture or focal lesion. Sinuses/Orbits: Visualized globes and orbits are unremarkable. The visualized sinuses and mastoid  air cells are clear. Other: None. IMPRESSION: Normal unenhanced CT scan of the brain. Electronically Signed   By: Lajean Manes M.D.   On: 10/07/2018 21:58   Dg Foot Complete Right  Result Date: 10/07/2018 CLINICAL DATA:  Syncopal episode with fall today. Right foot pain. EXAM: RIGHT FOOT COMPLETE - 3+ VIEW COMPARISON:  None. FINDINGS: Displaced fractures of the second, third, and fourth distal metatarsals at the diaphyseal neck junction. Lateral displacement of distal fracture fragments. No intra-articular extension. Most frank alignment is grossly maintained. Dorsal soft tissue edema overlies the forefoot. IMPRESSION: Displaced second, third, and fourth distal metatarsal fractures. No intra-articular extension. Electronically Signed   By: Keith Rake M.D.   On: 10/07/2018 21:59    Procedures Procedures (including critical care time)  Medications Ordered in ED Medications  fentaNYL (SUBLIMAZE) injection 50 mcg (has no administration in time range)  ondansetron (ZOFRAN) injection 4 mg (has no administration in time range)  sodium chloride flush (NS) 0.9 % injection 3 mL (3 mLs Intravenous Given 10/07/18 2054)  HYDROcodone-acetaminophen (NORCO/VICODIN) 5-325 MG per tablet 1 tablet (1 tablet Oral Given 10/07/18 2053)  sodium chloride 0.9 % bolus 1,000 mL (1,000 mLs Intravenous New Bag/Given 10/07/18 2249)     Initial Impression / Assessment and Plan / ED Course  I have reviewed the triage vital signs and the nursing notes.  Pertinent labs & imaging results that were available during my care of the patient were  reviewed by me and considered in my medical decision making (see chart for details).        Pt with lightheadedness since yesterday, orthostasis, persistent headache since fall with 2nd, 3rd and 4th right metatarsal head fractures, displaced.    Pt will not be able to tolerate ambulation, especially using crutches until orthostasis is improved.  She is getting IV fluids.     Discussed injury with Dr. Aline Brochure.  Asked for the hospitalist to call for consult in am - he will consult pt in hospital.   Discussed with Dr. Maudie Mercury who will admit pt.    Final Clinical Impressions(s) / ED Diagnoses   Final diagnoses:  Orthostatic lightheadedness  Syncope, unspecified syncope type  Multiple closed fractures of metatarsal bone of right foot, initial encounter    ED Discharge Orders    None       Landis Martins 10/07/18 [place], [place], DO 10/12/18 1115

## 2018-10-07 NOTE — H&P (Signed)
TRH H&P    Patient Demographics:    Tanya Velazquez, is a 58 y.o. female  MRN: [id number]  DOB - 1960/03/07  Admit Date - 10/07/2018  Referring MD/NP/PA: Evalee Jefferson  Outpatient Primary MD for the patient is Lucia Gaskins, MD  Patient coming from:  home  Chief complaint- syncope   HPI:    Tanya Velazquez  is a 58 y.o. female, w hypertension, Dm2, Gerd, Migraines, chronic lower back pain,  smoker,  Copd not on home o2, apparently presents with c/o syncope while trying to get something out of cabinet in the kitchen.  When she bent down and got up had syncope preceded by dizziness, no vertigo.  Pt denies cp, palp, sob, n/v,  numbness, tingling , weakness, incontinence.  Pt injured her right foot when fell. Pt was sent by [facility] to ER.   In ED,  T 98.2, P 75  R 18, Bp 123/90  Pox 97% on RA Wt 67.1kg  CT brain  IMPRESSION: Normal unenhanced CT scan of the brain.  CXR IMPRESSION: 1. No acute abnormality. 2. Chronic hyperinflation. Urinalysis negative Na 133, K 3.9, Bun 16, Creatinine 1.25 Glucose 172 Wbc 6.5, Hgb 11.1, Plt 184 D dimer <0.27   Xray R foot IMPRESSION: Displaced second, third, and fourth distal metatarsal fractures. No intra-articular extension.  ED consulted Dr. Aline Brochure who will be by to look at the patient in the AM.   Pt will be admitted for syncope as well as R metatarsal fractures.      Review of systems:    In addition to the HPI above,  No Fever-chills, No Headache, No changes with Vision or hearing, No problems swallowing food or Liquids, No Chest pain, Cough or Shortness of Breath, No Abdominal pain, No Nausea or Vomiting, bowel movements are regular, No Blood in stool or Urine, No dysuria, No new skin rashes or bruises,   No new weakness, tingling, numbness in any extremity, No recent weight gain or loss, No polyuria, polydypsia or  polyphagia, No significant Mental Stressors.  All other systems reviewed and are negative.    Past History of the following :    Past Medical History:  Diagnosis Date  . Anxiety   . B12 deficiency anemia   . Chronic constipation   . Chronic constipation   . Chronic low back pain    lumbar radiculopathy  . COPD (chronic obstructive pulmonary disease) ([place])   . Diabetes mellitus   . DJD (degenerative joint disease)   . GERD (gastroesophageal reflux disease) 04/13/07   EGD Dr Rourk->patulous EG junction, HH, antral erosions  . H/O hiatal hernia   . Hyperplastic colon polyp 06/27/2010  . Hypertension   . Migraines   . Osteoarthritis   . Raspy voice   . Smokers' cough ([place])   . SUI (stress urinary incontinence, female)       Past Surgical History:  Procedure Laterality Date  . APPENDECTOMY    . BLADDER SUSPENSION  10/07/2011   Procedure: [facility] PROCEDURE;  Surgeon: Marshall Cork  Jeffie Pollock, MD;  Location: [facility];  Service: Urology;  Laterality: N/A;  1 hour requested for this case   . CARDIAC CATHETERIZATION  08-09-2004 DR Einar Gip   NORMAL CORONARY ARTERIES/  NORMAL LVF  . CARDIAC CATHETERIZATION  2006   normal coronary arteries  . COLONOSCOPY  3/31/9   Dr Rourk->friable anal canal  . COLONOSCOPY  0 06/27/10   Dr. Gala Romney hyperplastic polyp, prominent external hemorrhoid plexus. next tcs 06/2015.  . CYSTOSCOPY  10/07/2011   Procedure: CYSTOSCOPY;  Surgeon: Malka So, MD;  Location[facility];  Service: Urology;  Laterality: N/A;  . ESOPHAGEAL MANOMETRY  2013   Dr. Derrill Kay at [facility]. incomplete bolus clearance with some breaks in contractions but there was peristalsis  . ESOPHAGOGASTRODUODENOSCOPY  06/27/10   Hiatal hernia, GERD,  . EXCISION VOLAR GANGLION LEFT WRIST  06-27-2005  . HEAD-UP TILT TABLE TEST  01-21-2007 &  11-04-2004  DR Rollene Fare   HX CHRONIC RECURRENT SYNCOPAL. EPICODES--  NEGATIVE RESULT INCLUDING ISUPREL INFUSION  . HEMORRHOID SURGERY   09-13-2003  . LAPAROSCOPIC CHOLECYSTECTOMY  02-23-2007  . LAPAROTOMY W/ APPENDCTOMY AND LEFT SALPINGO-OOPHECTOMY  AGE 64'S  . pH/impedence  2013   Dr. Derrill Kay at [facility]. Increased reflux off PPI with excellent correlation between reflux events and regurgitation. Patient advised to hold PPI for study by Dr. Derrill Kay.   Marland Kitchen RIGHT SHOULDER ARTHROSCOPY/ DISTAL CLAVICLE RESECTION / DEBRIDEMENT ROTATOR CUFF AND LABRUM TEAR/ BURSECTOMY  06-28-2010   IMPINGEMENT SYNDROME/ AC JOINT ARTHRITIS/ ROTATOR CUFF TENDINOPATHY  . SHOULDER ARTHROSCOPY  08-13-2010   LEFT SHOULDER IMPINGEMENT SYNDROME/ DJD OF AC JOINT  . STOMACH SURGERY  2013   antireflux surgery at [place]  age 34's      Social History:      Social History   Tobacco Use  . Smoking status: Current Every Day Smoker    Packs/day: 1.00    Years: 35.00    Pack years: 35.00    Types: Cigarettes  . Smokeless tobacco: Never Used  Substance Use Topics  . Alcohol use: No    Alcohol/week: 0.0 standard drinks       Family History :     Family History  Problem Relation Age of Onset  . Colon cancer Mother 61  . Hypertension Mother   . Heart disease Mother 13       CABG  . Alzheimer's disease Father   . Hypertension Father   . Healthy Daughter   . Heart failure Sister   . Heart Problems Sister        Cardiac stent       Home Medications:   Prior to Admission medications   Medication Sig Start Date End Date Taking? Authorizing Provider  cholecalciferol (VITAMIN D) 1000 UNITS tablet Take 1,000 Units by mouth daily.   Yes [provider]  gabapentin (NEURONTIN) 300 MG capsule Take 300 mg by mouth 3 (three) times daily.   Yes [provider]  lisinopril (ZESTRIL) 20 MG tablet Take 1 tablet (20 mg total) by mouth daily. Hold until follow up with PCP Patient taking differently: Take 20 mg by mouth daily.  08/10/18  Yes Barton Dubois, MD  OLANZapine (ZYPREXA) 5 MG tablet Take 5 mg by mouth at  bedtime.   Yes [provider]  oxyCODONE (OXY IR/ROXICODONE) 5 MG immediate release tablet Take 1 tablet (5 mg total) by mouth every 6 (six) hours as needed for severe pain. 08/10/18  Yes Barton Dubois,  MD  pravastatin (PRAVACHOL) 20 MG tablet Take 1 tablet by mouth every morning.  12/17/12  Yes [provider]  PROAIR HFA 108 (90 BASE) MCG/ACT inhaler Inhale 1 puff into the lungs every 4 (four) hours as needed for wheezing or shortness of breath.  05/13/10  Yes [provider]  QUEtiapine (SEROQUEL) 400 MG tablet Take 400 mg by mouth at bedtime. 02/25/18  Yes [provider]  TRELEGY ELLIPTA 100-62.5-25 MCG/INH AEPB Inhale 1 puff into the lungs daily. 03/11/18  Yes [provider]  venlafaxine XR (EFFEXOR-XR) 75 MG 24 hr capsule Take 225 mg by mouth every morning.   Yes [provider]  vortioxetine HBr (TRINTELLIX) 10 MG TABS tablet Take 1 tablet (10 mg total) by mouth daily. Patient not taking: Reported on 10/07/2018 08/10/18   Barton Dubois, MD     Allergies:     Allergies  Allergen Reactions  . Amicinonide-Benzyl Alcohol [Cyclocort] Rash  . Amoxicillin Rash  . Doxycycline Rash  . Paxil [Paroxetine Hcl] Rash  . Penicillins Rash    Has patient had a PCN reaction causing immediate rash, facial/tongue/throat swelling, SOB or lightheadedness with hypotension: Yes Has patient had a PCN reaction causing severe rash involving mucus membranes or skin necrosis: Yes Has patient had a PCN reaction that required hospitalization: No Has patient had a PCN reaction occurring within the last 10 years: No If all of the above answers are "NO", then may proceed with Cephalosporin use.   . Sulfa Antibiotics Rash  . Trimethoprim Rash     Physical Exam:   Vitals  Blood pressure 120/72, pulse 64, temperature 98.2 F (36.8 C), temperature source Oral, resp. rate 18, height 5\' 2"  (1.575 m), weight 67.1 kg, SpO2 97 %.  1.  General: axoxo3  2.  Psychiatric: euthymic  3. Neurologic: cn2-12 intact, reflexes 2+ symmetric, diffuse with no clonus, motor 5/5 in all 4 ext  4. HEENMT:  Anicteric, pupils 1.20mm symmetric, direct, consensual, near intact Neck: no jvd  5. Respiratory : CTAB  6. Cardiovascular : rrr s1, s2, no m/g/r    7. Gastrointestinal:  Abd: soft, nt, nd, +bs  8. Skin:  Ext: no c/c,  Slight swelling of the dorsum of the right foot  9.Musculoskeletal:  Good ROM    Data Review:    CBC Recent Labs  Lab 10/07/18 1530  WBC 9.1  HGB 14.2  HCT 44.4  PLT 242  MCV 99.1  MCH 31.7  MCHC 32.0  RDW 13.2   ------------------------------------------------------------------------------------------------------------------  Results for orders placed or performed during the hospital encounter of 10/07/18 (from the past 48 hour(s))  Urinalysis, Routine w reflex microscopic     Status: Abnormal   Collection Time: 10/07/18  2:20 PM  Result Value Ref Range   Color, Urine STRAW (A) YELLOW   APPearance CLEAR CLEAR   Specific Gravity, Urine 1.006 1.005 - 1.030   pH 6.0 5.0 - 8.0   Glucose, UA NEGATIVE NEGATIVE mg/dL   Hgb urine dipstick NEGATIVE NEGATIVE   Bilirubin Urine NEGATIVE NEGATIVE   Ketones, ur NEGATIVE NEGATIVE mg/dL   Protein, ur NEGATIVE NEGATIVE mg/dL   Nitrite NEGATIVE NEGATIVE   Leukocytes,Ua NEGATIVE NEGATIVE    Comment: Performed at [facility], [address], [place], [place] [id number]  Basic metabolic panel     Status: Abnormal   Collection Time: 10/07/18  3:30 PM  Result Value Ref Range   Sodium 133 (L) 135 - 145 mmol/L   Potassium 3.9 3.5 -  5.1 mmol/L   Chloride 96 (L) 98 - 111 mmol/L   CO2 25 22 - 32 mmol/L   Glucose, Bld 172 (H) 70 - 99 mg/dL   BUN 16 6 - 20 mg/dL   Creatinine, Ser 1.25 (H) 0.44 - 1.00 mg/dL   Calcium 10.9 (H) 8.9 - 10.3 mg/dL   GFR calc non Af Amer 47 (L) >60 mL/min   GFR calc Af Amer 55 (L) >60 mL/min   Anion gap 12 5 - 15    Comment: Performed at [facility], [address], [place], [place] [address]  CBC     Status: None   Collection Time: 10/07/18  3:30 PM  Result Value Ref Range   WBC 9.1 4.0 - 10.5 K/uL   RBC 4.48 3.87 - 5.11 MIL/uL   Hemoglobin 14.2 12.0 - 15.0 g/dL   HCT 44.4 36.0 - 46.0 %   MCV 99.1 80.0 - 100.0 fL   MCH 31.7 26.0 - 34.0 pg   MCHC 32.0 30.0 - 36.0 g/dL   RDW 13.2 11.5 - 15.5 %   Platelets 242 150 - 400 K/uL   nRBC 0.0 0.0 - 0.2 %    Comment: Performed at [facility], [address], [place], [place] [address]  D-dimer, quantitative (not at [facility])     Status: None   Collection Time: 10/07/18  3:30 PM  Result Value Ref Range   D-Dimer, Quant <0.27 0.00 - 0.50 ug/mL-FEU    Comment: (NOTE) At the manufacturer cut-off of 0.50 ug/mL FEU, this assay has been documented to exclude PE with a sensitivity and negative predictive value of 97 to 99%.  At this time, this assay has not been approved by the FDA to exclude DVT/VTE. Results should be correlated with clinical presentation. Performed at [facility], [address], [place], [place] [address]     Chemistries  Recent Labs  Lab 10/07/18 1530  NA 133*  K 3.9  CL 96*  CO2 25  GLUCOSE 172*  BUN 16  CREATININE 1.25*  CALCIUM 10.9*   ------------------------------------------------------------------------------------------------------------------  ------------------------------------------------------------------------------------------------------------------ GFR: Estimated Creatinine Clearance: 44.1 mL/min (A) (by C-G formula based on SCr of 1.25 mg/dL (H)). Liver Function Tests: No results for input(s): AST, ALT, ALKPHOS, BILITOT, PROT, ALBUMIN in the last 168 hours. No results for input(s): LIPASE, AMYLASE in the last 168 hours. No results for input(s): AMMONIA in the last 168 hours. Coagulation Profile: No results for input(s): INR, PROTIME in the last 168 hours. Cardiac Enzymes: No results for input(s): CKTOTAL, CKMB, CKMBINDEX, TROPONINI in the  last 168 hours. BNP (last 3 results) No results for input(s): PROBNP in the last 8760 hours. HbA1C: No results for input(s): HGBA1C in the last 72 hours. CBG: No results for input(s): GLUCAP in the last 168 hours. Lipid Profile: No results for input(s): CHOL, HDL, LDLCALC, TRIG, CHOLHDL, LDLDIRECT in the last 72 hours. Thyroid Function Tests: No results for input(s): TSH, T4TOTAL, FREET4, T3FREE, THYROIDAB in the last 72 hours. Anemia Panel: No results for input(s): VITAMINB12, FOLATE, FERRITIN, TIBC, IRON, RETICCTPCT in the last 72 hours.  --------------------------------------------------------------------------------------------------------------- Urine analysis:    Component Value Date/Time   COLORURINE STRAW (A) 10/07/2018 1420   APPEARANCEUR CLEAR 10/07/2018 1420   LABSPEC 1.006 10/07/2018 1420   PHURINE 6.0 10/07/2018 1420   GLUCOSEU NEGATIVE 10/07/2018 1420   HGBUR NEGATIVE 10/07/2018 1420   BILIRUBINUR NEGATIVE 10/07/2018 1420   KETONESUR NEGATIVE 10/07/2018 1420   PROTEINUR NEGATIVE 10/07/2018 1420   UROBILINOGEN 2.0 (H)  07/01/2014 1135   NITRITE NEGATIVE 10/07/2018 1420   LEUKOCYTESUR NEGATIVE 10/07/2018 1420      Imaging Results:    Dg Chest 2 View  Result Date: 10/07/2018 CLINICAL DATA:  Shortness of breath. EXAM: CHEST - 2 VIEW COMPARISON:  Radiograph 08/09/2018 FINDINGS: The cardiomediastinal contours are normal. Chronic hyperinflation. Minimal subsegmental bibasilar atelectasis or scarring. Pulmonary vasculature is normal. No consolidation, pleural effusion, or pneumothorax. No acute osseous abnormalities are seen. IMPRESSION: 1. No acute abnormality. 2. Chronic hyperinflation. Electronically Signed   By: Keith Rake M.D.   On: 10/07/2018 21:57   Ct Head Wo Contrast  Result Date: 10/07/2018 CLINICAL DATA:  Last night pt got dizzy, passed out and fell. Broke 2 toes on RT foot. Sent by [place] [facility] for evaul of syncopal episode. EXAM: CT HEAD  WITHOUT CONTRAST TECHNIQUE: Contiguous axial images were obtained from the base of the skull through the vertex without intravenous contrast. COMPARISON:  None. FINDINGS: Brain: No evidence of acute infarction, hemorrhage, hydrocephalus, extra-axial collection or mass lesion/mass effect. Vascular: No hyperdense vessel or unexpected calcification. Skull: Normal. Negative for fracture or focal lesion. Sinuses/Orbits: Visualized globes and orbits are unremarkable. The visualized sinuses and mastoid air cells are clear. Other: None. IMPRESSION: Normal unenhanced CT scan of the brain. Electronically Signed   By: Lajean Manes M.D.   On: 10/07/2018 21:58   Dg Foot Complete Right  Result Date: 10/07/2018 CLINICAL DATA:  Syncopal episode with fall today. Right foot pain. EXAM: RIGHT FOOT COMPLETE - 3+ VIEW COMPARISON:  None. FINDINGS: Displaced fractures of the second, third, and fourth distal metatarsals at the diaphyseal neck junction. Lateral displacement of distal fracture fragments. No intra-articular extension. Most frank alignment is grossly maintained. Dorsal soft tissue edema overlies the forefoot. IMPRESSION: Displaced second, third, and fourth distal metatarsal fractures. No intra-articular extension. Electronically Signed   By: Keith Rake M.D.   On: 10/07/2018 21:59    nsr at 39, nl axis, q in 2,3, avf, early R progression    Assessment & Plan:    Principal Problem:   Syncope Active Problems:   Essential hypertension   Metatarsal fracture  Syncope secondary to orthostatic hypotension Tele Trop I  Check carotid ultrasound Check cardiac echo  Metatarsal fractures NPO after 2am Check INR in am Orthopedics has been consulted, appreciated  Hypercalcemia ? Check cmp in am  Hypertension  Cont Lisinopril 20mg  po qday  Hyperlipidemia Cont Pravastatin 20mg  po qhs  Copd Cont Trelegy 1puff qday  Anxiety/ ? Cont Seroquel 400mg  po qhs Cont Zyprexa 5mg  po qhs Cont Effexor XR  225mg  po qday Cont Trintellix (note Effexor and Trintellix are very similar, and Seroquel and Zyprexa are both atypical antipsychotics, may need to discuss with her pcp prior to discharge if she really needs both)  Chronic back pain Cont Gabapentin 300mg  po tid    DVT Prophylaxis-   Lovenox - SCDs  AM Labs Ordered, also please review Full Orders  Family Communication: Admission, patients condition and plan of care including tests being ordered have been discussed with the patient  who indicate understanding and agree with the plan and Code Status.  Code Status:  FULL CODE per patient, notified daughter that patient will be admitted to [facility]   Admission status: Inpatient: Based on patients clinical presentation and evaluation of above clinical data, I have made determination that patient meets Inpatient criteria at this time. Pt requires admission for syncope evaluation as well as evaluation of multiple metatarsal fractures which may  require pinning. Pt has high risk of clinical deterioration and will require > 2 nites stay.   Time spent in minutes :  70 minutes   Jani Gravel M.D on 10/07/2018 at 11:34 PM

## 2018-10-07 NOTE — ED Notes (Signed)
Pt helped to the restroom

## 2018-10-07 NOTE — ED Triage Notes (Addendum)
Last night pt got dizzy, passed out and fell.  Broke 2 toes on RT foot.  Sent by [place] [facility] for evaul of syncopal episode. CBG 150 per ems.

## 2018-10-08 ENCOUNTER — Inpatient Hospital Stay (HOSPITAL_BASED_OUTPATIENT_CLINIC_OR_DEPARTMENT_OTHER): Payer: Medicare Other

## 2018-10-08 ENCOUNTER — Other Ambulatory Visit (HOSPITAL_COMMUNITY): Payer: Medicare Other

## 2018-10-08 ENCOUNTER — Inpatient Hospital Stay (HOSPITAL_COMMUNITY): Payer: Medicare Other

## 2018-10-08 DIAGNOSIS — S92301A Fracture of unspecified metatarsal bone(s), right foot, initial encounter for closed fracture: Secondary | ICD-10-CM

## 2018-10-08 DIAGNOSIS — R55 Syncope and collapse: Secondary | ICD-10-CM | POA: Diagnosis not present

## 2018-10-08 DIAGNOSIS — I10 Essential (primary) hypertension: Secondary | ICD-10-CM

## 2018-10-08 LAB — COMPREHENSIVE METABOLIC PANEL
ALT: 11 U/L (ref 0–44)
AST: 11 U/L — ABNORMAL LOW (ref 15–41)
Albumin: 3.9 g/dL (ref 3.5–5.0)
Alkaline Phosphatase: 73 U/L (ref 38–126)
Anion gap: 7 (ref 5–15)
BUN: 13 mg/dL (ref 6–20)
CO2: 26 mmol/L (ref 22–32)
Calcium: 9.6 mg/dL (ref 8.9–10.3)
Chloride: 104 mmol/L (ref 98–111)
Creatinine, Ser: 1.01 mg/dL — ABNORMAL HIGH (ref 0.44–1.00)
GFR calc Af Amer: >60 mL/min (ref >60)
GFR calc non Af Amer: >60 mL/min (ref >60)
Glucose, Bld: 98 mg/dL (ref 70–99)
Potassium: 4.2 mmol/L (ref 3.5–5.1)
Sodium: 137 mmol/L (ref 135–145)
Total Bilirubin: 0.6 mg/dL (ref 0.3–1.2)
Total Protein: 6.8 g/dL (ref 6.5–8.1)

## 2018-10-08 LAB — CBC
HCT: 37.4 % (ref 36.0–46.0)
Hemoglobin: 12 g/dL (ref 12.0–15.0)
MCH: 32.2 pg (ref 26.0–34.0)
MCHC: 32.1 g/dL (ref 30.0–36.0)
MCV: 100.3 fL — ABNORMAL HIGH (ref 80.0–100.0)
Platelets: 189 10*3/uL (ref 150–400)
RBC: 3.73 MIL/uL — ABNORMAL LOW (ref 3.87–5.11)
RDW: 13.2 % (ref 11.5–15.5)
WBC: 5.1 10*3/uL (ref 4.0–10.5)
nRBC: 0 % (ref 0.0–0.2)

## 2018-10-08 LAB — APTT: aPTT: 28 seconds (ref 24–36)

## 2018-10-08 LAB — PROTIME-INR
INR: 1 (ref 0.8–1.2)
Prothrombin Time: 13.5 seconds (ref 11.4–15.2)

## 2018-10-08 LAB — SARS CORONAVIRUS 2 (TAT 6-24 HRS): SARS Coronavirus 2: NEGATIVE

## 2018-10-08 LAB — ECHOCARDIOGRAM COMPLETE
Height: 62 in
Weight: 2368 oz

## 2018-10-08 MED ORDER — ALBUTEROL SULFATE (2.5 MG/3ML) 0.083% IN NEBU: 2.5000 mg | INHALATION_SOLUTION | RESPIRATORY_TRACT | Status: DC | PRN

## 2018-10-08 MED ORDER — UMECLIDINIUM BROMIDE 62.5 MCG/INH IN AEPB
1.0000 | INHALATION_SPRAY | Freq: Every day | RESPIRATORY_TRACT | Status: DC
  Administered 2018-10-08 – 2018-10-10 (×3): 1 via RESPIRATORY_TRACT
  Filled 2018-10-08: qty 7

## 2018-10-08 MED ORDER — FLUTICASONE FUROATE-VILANTEROL 100-25 MCG/INH IN AEPB
1.0000 | INHALATION_SPRAY | Freq: Every day | RESPIRATORY_TRACT | Status: DC
  Administered 2018-10-08 – 2018-10-10 (×3): 1 via RESPIRATORY_TRACT
  Filled 2018-10-08: qty 28

## 2018-10-08 MED ORDER — ACETAMINOPHEN 325 MG PO TABS: 650.0000 mg | ORAL_TABLET | Freq: Four times a day (QID) | ORAL | 0 refills | Status: DC | PRN

## 2018-10-08 MED ORDER — INFLUENZA VAC SPLIT QUAD 0.5 ML IM SUSY
0.5000 mL | PREFILLED_SYRINGE | INTRAMUSCULAR | Status: AC
  Administered 2018-10-09: 0.5 mL via INTRAMUSCULAR
  Filled 2018-10-08: qty 0.5

## 2018-10-08 NOTE — Discharge Summary (Signed)
Tanya Velazquez, is a 58 y.o. female  DOB 06/23/1960  MRN [id number].  Admission date:  10/07/2018  Admitting Physician  Jani Gravel, MD  Discharge Date:  10/10/2018   Primary MD  Lucia Gaskins, MD  Recommendations for primary care physician for things to follow:   - 1)Rt foot fractures-- Needs walker and home Physical therapy 2) follow-up with orthopedic surgeon Dr. Arther Abbott at Address: [address] [place], [place] [address] Phone: [phone]--- on 10/15/2018  for recheck  Admission Diagnosis  Syncope and collapse [R55] Orthostatic lightheadedness [R42] Multiple closed fractures of metatarsal bone of right foot, initial encounter [S92.301A] Syncope, unspecified syncope type [R55]   Discharge Diagnosis  Syncope and collapse [R55] Orthostatic lightheadedness [R42] Multiple closed fractures of metatarsal bone of right foot, initial encounter [S92.301A] Syncope, unspecified syncope type [R55]    Principal Problem:   Syncope Active Problems:   Essential hypertension   Metatarsal fracture      Past Medical History:  Diagnosis Date  . Anxiety   . B12 deficiency anemia   . Chronic constipation   . Chronic constipation   . Chronic low back pain    lumbar radiculopathy  . COPD (chronic obstructive pulmonary disease) ([place])   . Diabetes mellitus   . DJD (degenerative joint disease)   . GERD (gastroesophageal reflux disease) 04/13/07   EGD Dr Rourk->patulous EG junction, HH, antral erosions  . H/O hiatal hernia   . Hyperplastic colon polyp 06/27/2010  . Hypertension   . Migraines   . Osteoarthritis   . Raspy voice   . Smokers' cough ([place])   . SUI (stress urinary incontinence, female)     Past Surgical History:  Procedure Laterality Date  . APPENDECTOMY    . BLADDER SUSPENSION  10/07/2011   Procedure: [facility] PROCEDURE;  Surgeon: Malka So, MD;  Location: [facility];  Service: Urology;  Laterality: N/A;  1 hour requested for this case   . CARDIAC CATHETERIZATION  08-09-2004 DR Einar Gip   NORMAL CORONARY ARTERIES/  NORMAL LVF  . CARDIAC CATHETERIZATION  2006   normal coronary arteries  . COLONOSCOPY  3/31/9   Dr Rourk->friable anal canal  . COLONOSCOPY  0 06/27/10   Dr. Gala Romney hyperplastic polyp, prominent external hemorrhoid plexus. next tcs 06/2015.  . CYSTOSCOPY  10/07/2011   Procedure: CYSTOSCOPY;  Surgeon: Malka So, MD;  Location: [facility];  Service: Urology;  Laterality: N/A;  . ESOPHAGEAL MANOMETRY  2013   Dr. Derrill Kay at [facility]. incomplete bolus clearance with some breaks in contractions but there was peristalsis  . ESOPHAGOGASTRODUODENOSCOPY  06/27/10   Hiatal hernia, GERD,  . EXCISION VOLAR GANGLION LEFT WRIST  06-27-2005  . HEAD-UP TILT TABLE TEST  01-21-2007 &  11-04-2004  DR Rollene Fare   HX CHRONIC RECURRENT SYNCOPAL. EPICODES--  NEGATIVE RESULT INCLUDING ISUPREL INFUSION  . HEMORRHOID SURGERY  09-13-2003  . LAPAROSCOPIC CHOLECYSTECTOMY  02-23-2007  . LAPAROTOMY W/ APPENDCTOMY AND LEFT SALPINGO-OOPHECTOMY  AGE 71'S  . pH/impedence  2013   Dr. Derrill Kay at [facility]. Increased reflux off PPI with excellent correlation between reflux events and regurgitation. Patient advised to hold PPI for study by Dr. Derrill Kay.   Marland Kitchen RIGHT SHOULDER ARTHROSCOPY/ DISTAL CLAVICLE RESECTION / DEBRIDEMENT ROTATOR CUFF AND LABRUM TEAR/ BURSECTOMY  06-28-2010   IMPINGEMENT SYNDROME/ AC JOINT ARTHRITIS/ ROTATOR CUFF TENDINOPATHY  . SHOULDER ARTHROSCOPY  08-13-2010   LEFT SHOULDER IMPINGEMENT SYNDROME/ DJD OF AC JOINT  . STOMACH SURGERY  2013   antireflux surgery at [facility]  . [place]  age 42's       HPI  from the history and physical done on the day of admission:    -- Giannina Sarten  is a 58 y.o. female, w hypertension, Dm2, Gerd, Migraines, chronic lower back pain,  smoker,  Copd not on home o2, apparently presents with c/o syncope  while trying to get something out of cabinet in the kitchen.  When she bent down and got up had syncope preceded by dizziness, no vertigo.  Pt denies cp, palp, sob, n/v,  numbness, tingling , weakness, incontinence.  Pt injured her right foot when fell. Pt was sent by [facility] to ER.   In ED,  T 98.2, P 75  R 18, Bp 123/90  Pox 97% on RA Wt 67.1kg  CT brain  IMPRESSION: Normal unenhanced CT scan of the brain.  CXR IMPRESSION: 1. No acute abnormality. 2. Chronic hyperinflation. Urinalysis negative Na 133, K 3.9, Bun 16, Creatinine 1.25 Glucose 172 Wbc 6.5, Hgb 11.1, Plt 184 D dimer <0.27   Xray R foot IMPRESSION: Displaced second, third, and fourth distal metatarsal fractures. No intra-articular extension.  ED consulted Dr. Aline Brochure who will be by to look at the patient in the AM.   Pt will be admitted for syncope as well as R metatarsal fractures.    Hospital Course:     1)Syncope-  on telemetry monitored unit no significant arrhythmias, patient has ruled out for ACS already by serial troponins and EKG ,  echocardiogram without significant aortic stenosis or other outflow obstruction, EF is 60 to 65% without segmental/Regional wall motion abnormalities.  carotid artery Dopplers without hemodynamically significant stenosis   2)Orthostatic Dizziness-systolic BP was initially in the 70s to 80s, resolved after IV fluids  -Patient advised to not to resume lisinopril  --No further dizziness or palpitations  --Suspect hypotension and orthostatic issues may have contributed to syncope PTA, -A.m. cortisol level was 13.2 ---??  Antipsychotic medications may be contributing to orthostatic symptoms  2)Rt Footfractures-patient with fractures of second third and fourth metatarsal bones in the right foot, discussed with orthopedic surgeon Dr Aline Brochure, patient remains neurovascularly intact, toes appears to be aligned normally, follow-up as outpatienton  10/15/2018 WBAT- use walker  -Outpatient physical therapy as advised  3)Neuropsych--stable, continue home Rx including Effexor 225 mg daily, Seroquel 400 mg nightly, Zyprexa 5 mg nightly, gabapentin 200 mg 3 times daily  4)COPD-stable, continue bronchodilators no acute flareup at this   Discharge Condition: Stable  Follow UP--- orthopedic surgeon  Follow-up Information    Schedule an appointment as soon as possible for a visit  with Carole Civil, MD.   Specialties: Orthopedic Surgery, Radiology Why: Friday appointment Contact information: [place] [address] [phone]          Diet and Activity recommendation:  As advised  Discharge Instructions    Discharge Instructions    Call MD for:  difficulty breathing, headache or visual disturbances  Complete by: As directed    Call MD for:  persistant dizziness or light-headedness   Complete by: As directed    Call MD for:  persistant nausea and vomiting   Complete by: As directed    Call MD for:  severe uncontrolled pain   Complete by: As directed    Call MD for:  temperature >100.4   Complete by: As directed    Diet - low sodium heart healthy   Complete by: As directed    Discharge instructions   Complete by: As directed    1)Rt foot fractures-- Needs walker and home Physical therapy 2) follow-up with orthopedic surgeon Dr. Arther Abbott at Address: [address] [place], [place] [address] Phone: [phone]--- on 10/15/2018  for recheck   Increase activity slowly   Complete by: As directed    Use walker for ambulation and standing        Discharge Medications     Allergies as of 10/10/2018      Reactions   Amicinonide-benzyl Alcohol [cyclocort] Rash   Amoxicillin Rash   Doxycycline Rash   Paxil [paroxetine Hcl] Rash   Penicillins Rash   Has patient had a PCN reaction causing immediate rash, facial/tongue/throat swelling, SOB or lightheadedness with hypotension: Yes Has  patient had a PCN reaction causing severe rash involving mucus membranes or skin necrosis: Yes Has patient had a PCN reaction that required hospitalization: No Has patient had a PCN reaction occurring within the last 10 years: No If all of the above answers are "NO", then may proceed with Cephalosporin use.   Sulfa Antibiotics Rash   Trimethoprim Rash      Medication List    STOP taking these medications   lisinopril 20 MG tablet Commonly known as: ZESTRIL     TAKE these medications   acetaminophen 325 MG tablet Commonly known as: TYLENOL Take 2 tablets (650 mg total) by mouth every 6 (six) hours as needed for mild pain (or Fever >/= 101).   cholecalciferol 1000 units tablet Commonly known as: VITAMIN D Take 1,000 Units by mouth daily.   gabapentin 300 MG capsule Commonly known as: NEURONTIN Take 300 mg by mouth 3 (three) times daily.   OLANZapine 5 MG tablet Commonly known as: ZYPREXA Take 5 mg by mouth at bedtime.   oxyCODONE 5 MG immediate release tablet Commonly known as: Oxy IR/ROXICODONE Take 1 tablet (5 mg total) by mouth every 6 (six) hours as needed for severe pain.   pravastatin 20 MG tablet Commonly known as: PRAVACHOL Take 1 tablet by mouth every morning.   ProAir HFA 108 (90 Base) MCG/ACT inhaler Generic drug: albuterol Inhale 1 puff into the lungs every 4 (four) hours as needed for wheezing or shortness of breath.   QUEtiapine 400 MG tablet Commonly known as: SEROQUEL Take 400 mg by mouth at bedtime.   Trelegy Ellipta 100-62.5-25 MCG/INH Aepb Generic drug: Fluticasone-Umeclidin-Vilant Inhale 1 puff into the lungs daily.   venlafaxine XR 75 MG 24 hr capsule Commonly known as: EFFEXOR-XR Take 225 mg by mouth every morning.   vortioxetine HBr 10 MG Tabs tablet Commonly known as: Trintellix Take 1 tablet (10 mg total) by mouth daily.            Durable Medical Equipment  (From admission, onward)         Start     Ordered   10/08/18 1832   For home use only DME Gilford Rile  [facility])  Once    Question:  Patient needs a walker to treat with the following condition  Answer:  Fracture, foot, right, closed, initial encounter   10/08/18 1835          Major procedures and Radiology Reports - PLEASE review detailed and final reports for all details, in brief -    Dg Chest 2 View  Result Date: 10/07/2018 CLINICAL DATA:  Shortness of breath. EXAM: CHEST - 2 VIEW COMPARISON:  Radiograph 08/09/2018 FINDINGS: The cardiomediastinal contours are normal. Chronic hyperinflation. Minimal subsegmental bibasilar atelectasis or scarring. Pulmonary vasculature is normal. No consolidation, pleural effusion, or pneumothorax. No acute osseous abnormalities are seen. IMPRESSION: 1. No acute abnormality. 2. Chronic hyperinflation. Electronically Signed   By: Keith Rake M.D.   On: 10/07/2018 21:57   Ct Head Wo Contrast  Result Date: 10/07/2018 CLINICAL DATA:  Last night pt got dizzy, passed out and fell. Broke 2 toes on RT foot. Sent by [facility] for evaul of syncopal episode. EXAM: CT HEAD WITHOUT CONTRAST TECHNIQUE: Contiguous axial images were obtained from the base of the skull through the vertex without intravenous contrast. COMPARISON:  None. FINDINGS: Brain: No evidence of acute infarction, hemorrhage, hydrocephalus, extra-axial collection or mass lesion/mass effect. Vascular: No hyperdense vessel or unexpected calcification. Skull: Normal. Negative for fracture or focal lesion. Sinuses/Orbits: Visualized globes and orbits are unremarkable. The visualized sinuses and mastoid air cells are clear. Other: None. IMPRESSION: Normal unenhanced CT scan of the brain. Electronically Signed   By: Lajean Manes M.D.   On: 10/07/2018 21:58   US Carotid Bilateral  Result Date: 10/08/2018 CLINICAL DATA:  Visual disturbance. History of hypertension and diabetes. EXAM: BILATERAL CAROTID DUPLEX ULTRASOUND TECHNIQUE: Pearline Cables scale imaging, color Doppler  and duplex ultrasound were performed of bilateral carotid and vertebral arteries in the neck. COMPARISON:  None. FINDINGS: Criteria: Quantification of carotid stenosis is based on velocity parameters that correlate the residual internal carotid diameter with NASCET-based stenosis levels, using the diameter of the distal internal carotid lumen as the denominator for stenosis measurement. The following velocity measurements were obtained: RIGHT ICA: 102/43 cm/sec CCA: [id number] cm/sec SYSTOLIC ICA/CCA RATIO:  1.1 ECA: 81 cm/sec LEFT ICA: 116/39 cm/sec CCA: [id number] cm/sec SYSTOLIC ICA/CCA RATIO:  1.1 ECA: 88 cm/sec RIGHT CAROTID ARTERY: There is a minimal amount of eccentric hypoechoic plaque within the right carotid bulb (image 24), not resulting in elevated peak systolic velocities within the interrogated course of the right internal carotid artery to suggest a hemodynamically significant stenosis. RIGHT VERTEBRAL ARTERY:  Antegrade flow LEFT CAROTID ARTERY: There is no grayscale evidence of significant intimal thickening or atherosclerotic plaque affecting the interrogated portions of the left carotid system. There are no elevated peak systolic velocities within the interrogated course of the left internal carotid artery to suggest a hemodynamically significant stenosis. LEFT VERTEBRAL ARTERY:  Antegrade flow IMPRESSION: 1. Very minimal amount of right-sided atherosclerotic plaque, not resulting in a hemodynamically significant stenosis. 2. Normal sonographic evaluation of the left carotid system. Electronically Signed   By: Sandi Mariscal M.D.   On: 10/08/2018 14:48   Dg Foot Complete Right  Result Date: 10/07/2018 CLINICAL DATA:  Syncopal episode with fall today. Right foot pain. EXAM: RIGHT FOOT COMPLETE - 3+ VIEW COMPARISON:  None. FINDINGS: Displaced fractures of the second, third, and fourth distal metatarsals at the diaphyseal neck junction. Lateral displacement of distal fracture fragments. No intra-articular  extension. Most frank alignment is grossly maintained. Dorsal soft tissue edema overlies the forefoot. IMPRESSION: Displaced second, third, and fourth distal  metatarsal fractures. No intra-articular extension. Electronically Signed   By: Keith Rake M.D.   On: 10/07/2018 21:59    Micro Results    Recent Results (from the past 240 hour(s))  SARS CORONAVIRUS 2 (TAT 6-24 HRS) Nasopharyngeal Nasopharyngeal Swab     Status: None   Collection Time: 10/07/18 12:10 AM   Specimen: Nasopharyngeal Swab  Result Value Ref Range Status   SARS Coronavirus 2 NEGATIVE NEGATIVE Final    Comment: (NOTE) SARS-CoV-2 target nucleic acids are NOT DETECTED. The SARS-CoV-2 RNA is generally detectable in upper and lower respiratory specimens during the acute phase of infection. Negative results do not preclude SARS-CoV-2 infection, do not rule out co-infections with other pathogens, and should not be used as the sole basis for treatment or other patient management decisions. Negative results must be combined with clinical observations, patient history, and epidemiological information. The expected result is Negative. Fact Sheet for Patients: SugarRoll.be Fact Sheet for Healthcare Providers: https://www.woods-mathews.com/ This test is not yet approved or cleared by the [place] FDA and  has been authorized for detection and/or diagnosis of SARS-CoV-2 by FDA under an Emergency Use Authorization (EUA). This EUA will remain  in effect (meaning this test can be used) for the duration of the COVID-19 declaration under Section 56 4(b)(1) of the Act, 21 U.S.C. section 360bbb-3(b)(1), unless the authorization is terminated or revoked sooner. Performed at [facility], [place] [address] [place], [place] [address]        Today   Subjective    Kenly Deluise today has no new concerns.  --No further dizziness or palpitation          Patient has been  seen and examined prior to discharge   Objective   Blood pressure (!) 90/59, pulse 66, temperature 97.6 F (36.4 C), temperature source Oral, resp. rate 16, height 5\' 2"  (1.575 m), weight 65.8 kg, SpO2 99 %.   Intake/Output Summary (Last 24 hours) at 10/10/2018 1502 Last data filed at 10/10/2018 0700 Gross per 24 hour  Intake 2640 ml  Output -  Net 2640 ml   Exam Gen:- Awake Alert, no acute distress  HEENT:- [place].AT, No sclera icterus Neck-Supple Neck,No JVD,.  Lungs-  CTAB , good air movement bilaterally  CV- S1, S2 normal, regular, no further dizziness Abd-  +ve B.Sounds, Abd Soft, No tenderness,    Extremity/Skin:- No  edema,   good pulses Psych-affect is appropriate, oriented x3 Neuro-no new focal deficits, no tremors  MSK-right foot in splint, good capillary refill   Data Review   CBC w Diff:  Lab Results  Component Value Date   WBC 5.1 10/08/2018   HGB 12.0 10/08/2018   HCT 37.4 10/08/2018   PLT 189 10/08/2018   LYMPHOPCT 12 08/09/2018   MONOPCT 6 08/09/2018   EOSPCT 1 08/09/2018   BASOPCT 0 08/09/2018   CMP:  Lab Results  Component Value Date   NA 137 10/08/2018   K 4.2 10/08/2018   CL 104 10/08/2018   CO2 26 10/08/2018   BUN 13 10/08/2018   CREATININE 1.01 (H) 10/08/2018   CREATININE 0.81 04/10/2014   PROT 6.8 10/08/2018   ALBUMIN 3.9 10/08/2018   BILITOT 0.6 10/08/2018   ALKPHOS 73 10/08/2018   AST 11 (L) 10/08/2018   ALT 11 10/08/2018    Total Discharge time is about 33 minutes  Roxan Hockey M.D on 10/10/2018 at 3:02 PM  Go to [email or web address] -  for contact info  [facility] - Office  [phone]   

## 2018-10-08 NOTE — Progress Notes (Signed)
*  PRELIMINARY RESULTS* Echocardiogram 2D Echocardiogram has been performed.  Tanya Velazquez 10/08/2018, 4:50 PM

## 2018-10-08 NOTE — Progress Notes (Signed)
Patient Demographics:    Tanya Velazquez, is a 58 y.o. female, DOB - 1960-02-17, IC:[id number]  Admit date - 10/07/2018   Admitting Physician Jani Gravel, MD  Outpatient Primary MD for the patient is Lucia Gaskins, MD  LOS - 1   Chief Complaint  Patient presents with  . Fall        Subjective:    Tanya Velazquez today has no fevers, no emesis,  No chest pain, resting comfortably, complains of right foot pain and dizziness  Assessment  & Plan :    Principal Problem:   Syncope Active Problems:   Essential hypertension   Metatarsal fracture   1)Syncope-  on telemetry monitored unit no significant arrhythmias, patient has ruled out for ACS already by serial troponins and EKG ,  echocardiogram without significant aortic stenosis or other outflow obstruction, EF is 60 to 65% without segmental/Regional wall motion abnormalities.  carotid artery Dopplers without hemodynamically significant stenosis -Patient continues to complain of dizziness, especially orthostatic dizziness, blood pressures are soft -Check a.m. cortisol level  2)Rt Foot fractures-patient with fractures of second third and fourth metatarsal bones in the right foot, discussed with orthopedic surgeon, patient remains neurovascularly intact, toes appears to be aligned normally, follow-up as outpatient on 10/15/2018 --Patient needs walker for ambulation  3)Neuropsych--stable, continue home Rx  Disposition/Need for in-Hospital Stay- patient unable to be discharged at this time due to -orthostatic symptoms, soft BP, syncope with risk of further syncopal episodes  Code Status : full  Family Communication:   NA (patient is alert, awake and coherent)   Disposition Plan  : home   Consults  :  na  DVT Prophylaxis  :  Lovenox - SCDs   Lab Results  Component Value Date   PLT 189 10/08/2018    Inpatient Medications  Scheduled Meds: .  cholecalciferol  1,000 Units Oral Daily  . enoxaparin (LOVENOX) injection  40 mg Subcutaneous Q24H  . umeclidinium bromide  1 puff Inhalation Daily   And  . fluticasone furoate-vilanterol  1 puff Inhalation Daily  . gabapentin  300 mg Oral TID  . [START ON 10/09/2018] influenza vac split quadrivalent PF  0.5 mL Intramuscular Tomorrow-1000  . lisinopril  20 mg Oral Daily  . OLANZapine  5 mg Oral QHS  . pravastatin  20 mg Oral q morning - 10a  . QUEtiapine  400 mg Oral QHS  . venlafaxine XR  225 mg Oral q morning - 10a   Continuous Infusions: PRN Meds:.acetaminophen **OR** acetaminophen, albuterol, HYDROmorphone (DILAUDID) injection, ondansetron (ZOFRAN) IV    Anti-infectives (From admission, onward)   None        Objective:   Vitals:   10/08/18 1009 10/08/18 1010 10/08/18 1505 10/08/18 1602  BP:   90/61 93/63  Pulse:    71  Resp:    16  Temp:      TempSrc:      SpO2: 96% 96%  93%  Weight:      Height:        Wt Readings from Last 3 Encounters:  10/07/18 67.1 kg  08/09/18 68.5 kg  02/23/18 69.6 kg     Intake/Output Summary (Last 24 hours) at 10/08/2018 1838 Last data filed at 10/08/2018 1800 Gross per 24  hour  Intake 243 ml  Output -  Net 243 ml     Physical Exam  Gen:- Awake Alert,  In no apparent distress  HEENT:- [place].AT, No sclera icterus Neck-Supple Neck,No JVD,.  Lungs-  CTAB , fair symmetrical air movement CV- S1, S2 normal, regular , dizziness Abd-  +ve B.Sounds, Abd Soft, No tenderness,    Extremity/Skin:- No  edema, pedal pulses present  Psych-affect is appropriate, oriented x3 Neuro-no new focal deficits, no tremors -MSK-right foot in splint, good capillary refill   Data Review:   Micro Results Recent Results (from the past 240 hour(s))  SARS CORONAVIRUS 2 (TAT 6-24 HRS) Nasopharyngeal Nasopharyngeal Swab     Status: None   Collection Time: 10/07/18 12:10 AM   Specimen: Nasopharyngeal Swab  Result Value Ref Range Status   SARS Coronavirus  2 NEGATIVE NEGATIVE Final    Comment: (NOTE) SARS-CoV-2 target nucleic acids are NOT DETECTED. The SARS-CoV-2 RNA is generally detectable in upper and lower respiratory specimens during the acute phase of infection. Negative results do not preclude SARS-CoV-2 infection, do not rule out co-infections with other pathogens, and should not be used as the sole basis for treatment or other patient management decisions. Negative results must be combined with clinical observations, patient history, and epidemiological information. The expected result is Negative. Fact Sheet for Patients: SugarRoll.be Fact Sheet for Healthcare Providers: https://www.woods-mathews.com/ This test is not yet approved or cleared by the [place] FDA and  has been authorized for detection and/or diagnosis of SARS-CoV-2 by FDA under an Emergency Use Authorization (EUA). This EUA will remain  in effect (meaning this test can be used) for the duration of the COVID-19 declaration under Section 56 4(b)(1) of the Act, 21 U.S.C. section 360bbb-3(b)(1), unless the authorization is terminated or revoked sooner. Performed at [facility], [place] [address] [place], [place] [address]     Radiology Reports Dg Chest 2 View  Result Date: 10/07/2018 CLINICAL DATA:  Shortness of breath. EXAM: CHEST - 2 VIEW COMPARISON:  Radiograph 08/09/2018 FINDINGS: The cardiomediastinal contours are normal. Chronic hyperinflation. Minimal subsegmental bibasilar atelectasis or scarring. Pulmonary vasculature is normal. No consolidation, pleural effusion, or pneumothorax. No acute osseous abnormalities are seen. IMPRESSION: 1. No acute abnormality. 2. Chronic hyperinflation. Electronically Signed   By: Keith Rake M.D.   On: 10/07/2018 21:57   Ct Head Wo Contrast  Result Date: 10/07/2018 CLINICAL DATA:  Last night pt got dizzy, passed out and fell. Broke 2 toes on RT foot. Sent by [facility] for evaul of syncopal episode. EXAM: CT HEAD WITHOUT CONTRAST TECHNIQUE: Contiguous axial images were obtained from the base of the skull through the vertex without intravenous contrast. COMPARISON:  None. FINDINGS: Brain: No evidence of acute infarction, hemorrhage, hydrocephalus, extra-axial collection or mass lesion/mass effect. Vascular: No hyperdense vessel or unexpected calcification. Skull: Normal. Negative for fracture or focal lesion. Sinuses/Orbits: Visualized globes and orbits are unremarkable. The visualized sinuses and mastoid air cells are clear. Other: None. IMPRESSION: Normal unenhanced CT scan of the brain. Electronically Signed   By: Lajean Manes M.D.   On: 10/07/2018 21:58   US Carotid Bilateral  Result Date: 10/08/2018 CLINICAL DATA:  Visual disturbance. History of hypertension and diabetes. EXAM: BILATERAL CAROTID DUPLEX ULTRASOUND TECHNIQUE: Pearline Cables scale imaging, color Doppler and duplex ultrasound were performed of bilateral carotid and vertebral arteries in the neck. COMPARISON:  None. FINDINGS: Criteria: Quantification of carotid stenosis is based on velocity parameters that correlate the residual internal  carotid diameter with NASCET-based stenosis levels, using the diameter of the distal internal carotid lumen as the denominator for stenosis measurement. The following velocity measurements were obtained: RIGHT ICA: 102/43 cm/sec CCA: [id number] cm/sec SYSTOLIC ICA/CCA RATIO:  1.1 ECA: 81 cm/sec LEFT ICA: 116/39 cm/sec CCA: [id number] cm/sec SYSTOLIC ICA/CCA RATIO:  1.1 ECA: 88 cm/sec RIGHT CAROTID ARTERY: There is a minimal amount of eccentric hypoechoic plaque within the right carotid bulb (image 24), not resulting in elevated peak systolic velocities within the interrogated course of the right internal carotid artery to suggest a hemodynamically significant stenosis. RIGHT VERTEBRAL ARTERY:  Antegrade flow LEFT CAROTID ARTERY: There is no grayscale evidence of significant  intimal thickening or atherosclerotic plaque affecting the interrogated portions of the left carotid system. There are no elevated peak systolic velocities within the interrogated course of the left internal carotid artery to suggest a hemodynamically significant stenosis. LEFT VERTEBRAL ARTERY:  Antegrade flow IMPRESSION: 1. Very minimal amount of right-sided atherosclerotic plaque, not resulting in a hemodynamically significant stenosis. 2. Normal sonographic evaluation of the left carotid system. Electronically Signed   By: Sandi Mariscal M.D.   On: 10/08/2018 14:48   Dg Foot Complete Right  Result Date: 10/07/2018 CLINICAL DATA:  Syncopal episode with fall today. Right foot pain. EXAM: RIGHT FOOT COMPLETE - 3+ VIEW COMPARISON:  None. FINDINGS: Displaced fractures of the second, third, and fourth distal metatarsals at the diaphyseal neck junction. Lateral displacement of distal fracture fragments. No intra-articular extension. Most frank alignment is grossly maintained. Dorsal soft tissue edema overlies the forefoot. IMPRESSION: Displaced second, third, and fourth distal metatarsal fractures. No intra-articular extension. Electronically Signed   By: Keith Rake M.D.   On: 10/07/2018 21:59     CBC Recent Labs  Lab 10/07/18 1530 10/08/18 0554  WBC 9.1 5.1  HGB 14.2 12.0  HCT 44.4 37.4  PLT 242 189  MCV 99.1 100.3*  MCH 31.7 32.2  MCHC 32.0 32.1  RDW 13.2 13.2    Chemistries  Recent Labs  Lab 10/07/18 1530 10/08/18 0554  NA 133* 137  K 3.9 4.2  CL 96* 104  CO2 25 26  GLUCOSE 172* 98  BUN 16 13  CREATININE 1.25* 1.01*  CALCIUM 10.9* 9.6  AST  --  11*  ALT  --  11  ALKPHOS  --  73  BILITOT  --  0.6   ------------------------------------------------------------------------------------------------------------------ No results for input(s): CHOL, HDL, LDLCALC, TRIG, CHOLHDL, LDLDIRECT in the last 72 hours.  No results found for:  HGBA1C ------------------------------------------------------------------------------------------------------------------ No results for input(s): TSH, T4TOTAL, T3FREE, THYROIDAB in the last 72 hours.  Invalid input(s): FREET3 ------------------------------------------------------------------------------------------------------------------ No results for input(s): VITAMINB12, FOLATE, FERRITIN, TIBC, IRON, RETICCTPCT in the last 72 hours.  Coagulation profile Recent Labs  Lab 10/08/18 0554  INR 1.0    Recent Labs    10/07/18 1530  DDIMER <0.27    Cardiac Enzymes No results for input(s): CKMB, TROPONINI, MYOGLOBIN in the last 168 hours.  Invalid input(s): CK ------------------------------------------------------------------------------------------------------------------ No results found for: BNP   Roxan Hockey M.D on 10/08/2018 at 6:38 PM  Go to [email or web address] - for contact info  [facility] - Office  [phone]

## 2018-10-08 NOTE — Care Management CC44 (Signed)
Condition Code 44 Documentation Completed  Patient Details  Name: Tanya Velazquez MRN: [id number] Date of Birth: 1960/11/01   Condition Code 44 given:  Yes Patient signature on Condition Code 44 notice:  Yes Documentation of 2 MD's agreement:  Yes Code 44 added to claim:  Yes    Ihor [name], LCSW 10/08/2018, 4:48 PM

## 2018-10-08 NOTE — Discharge Instructions (Addendum)
Tylenol as needed for pain and use ice every 4 hrs.   1)Rt foot fractures-- Needs walker and home Physical therapy 2)Follow-up with orthopedic surgeon Dr. Arther Abbott at Address: [address] [place], [place] [address] Phone: [phone]--- on 10/15/2018  for recheck

## 2018-10-08 NOTE — Progress Notes (Signed)
Patient ID: Tanya Velazquez, female   DOB: [birth year], 58 y.o.   MRN: [id number] Consult requested  Right foot metatarsal fractures 2, 3 4,   Neurovascular intact   Toes aligned normally  See no need for full consult charges   Follow in office oct 2

## 2018-10-08 NOTE — ED Provider Notes (Signed)
Patient being observed in emergency room for syncope and foot fracture.  Patient had an admission placed and initially hospitalist recommended outpatient follow-up in orthopedic evaluate the patient and arrange outpatient follow-up.  Unfortunately echo had not been done, patient still has plan for observation in the hospital.  Golda Acre, MD 10/08/18 1501

## 2018-10-08 NOTE — Care Management Obs Status (Signed)
[place] NOTIFICATION   Patient Details  Name: Tanya Velazquez MRN: [id number] Date of Birth: 1960-02-08   Medicare Observation Status Notification Given:  Yes    Ihor [name], LCSW 10/08/2018, 4:48 PM

## 2018-10-08 NOTE — Progress Notes (Signed)
Pt arrived to floor via stretcher from ED. Oriented to room. Pt was c/o pain rated 10/10, medicated per order. Pt also stated toes felt numb and tingly on right foot, dressing felt too tight. Ace wrap loosened on OCL splint. Pt states much better. Toes with brisk cap refill.

## 2018-10-09 DIAGNOSIS — R55 Syncope and collapse: Secondary | ICD-10-CM | POA: Diagnosis not present

## 2018-10-09 LAB — CORTISOL-AM, BLOOD: Cortisol - AM: 13.2 ug/dL (ref 6.7–22.6)

## 2018-10-09 MED ORDER — SODIUM CHLORIDE 0.9 % IV SOLN
INTRAVENOUS | Status: DC
  Administered 2018-10-09 – 2018-10-10 (×2): via INTRAVENOUS

## 2018-10-09 MED ORDER — OXYCODONE-ACETAMINOPHEN 5-325 MG PO TABS
1.0000 | ORAL_TABLET | Freq: Once | ORAL | Status: AC
  Administered 2018-10-09: 23:00:00 1 via ORAL
  Filled 2018-10-09: qty 1

## 2018-10-09 MED ORDER — SODIUM CHLORIDE 0.9 % IV BOLUS
1000.0000 mL | Freq: Once | INTRAVENOUS | Status: AC
  Administered 2018-10-09: 1000 mL via INTRAVENOUS

## 2018-10-09 MED ORDER — OXYCODONE-ACETAMINOPHEN 5-325 MG PO TABS
1.0000 | ORAL_TABLET | Freq: Once | ORAL | Status: AC
  Administered 2018-10-09: 14:00:00 1 via ORAL
  Filled 2018-10-09: qty 1

## 2018-10-09 NOTE — Progress Notes (Signed)
Fluid bolus completed. Pt states actually feels better than earlier this am. Orthostatic VS repeated, remains orthostatic but much less dizziness/lightheadedness. MD updated on pt's condition. No complaints from pt. Oral meds given per schedule.

## 2018-10-09 NOTE — TOC Transition Note (Signed)
Transition of Care [facility]) - CM/SW Discharge Note   Patient Details  Name: CLIFTON GRULLON MRN: [id number] Date of Birth: 1960-05-04  Transition of Care [facility] CM/SW Contact:  Latanya Maudlin, RN Phone Number: 10/09/2018, 1:10 PM   Clinical Narrative:   Patient to be discharged per MD order. Orders in place for home health services. CMS Medicare.gov Compare Post Acute Care list reviewed with patient and she has no preference of agency. Referral placed with Corene Cornea at [facility] care. Patient also with orders in place for DME rolling walker. Ordered from [place] with [employer] and planning for home delivery. Family to transport.     Final next level of care: Home w Home Health Services Barriers to Discharge: No Barriers Identified   Patient Goals and CMS Choice   CMS Medicare.gov Compare Post Acute Care list provided to:: Patient Choice offered to / list presented to : Patient  Discharge Placement                       Discharge Plan and Services                DME Arranged: Gilford Rile DME Agency: [employer] Date DME Agency Contacted: 10/09/18 Time DME Agency Contacted: 95 Representative spoke with at DME Agency: [place]: RN, PT [facility] Agency: [place] ([place]) Date [place]: 10/09/18 Time [place]: 1310 Representative spoke with at [place]: [place] ([place]) Interventions     Readmission Risk Interventions Readmission Risk Prevention Plan 10/09/2018  Post Dischage Appt Complete  Medication Screening Complete  Transportation Screening Complete  Some recent data might be hidden

## 2018-10-09 NOTE — Progress Notes (Signed)
Pt's VS rechecked due to low B/P reading recorded at 0630. Pt states she is lightheaded/dizzy with standing and ambulating to bathroom. Orthostatic vital signs check and pt is orthostatic with standing B/P 71/26 and HR 112. PT with no other complaints. Dr. Verdene Rio notified, awaiting further orders.

## 2018-10-09 NOTE — Progress Notes (Signed)
Patient Demographics:    Tanya Velazquez, is a 58 y.o. female, DOB - March 21, 1960, [id number]  Admit date - 10/07/2018   Admitting Physician Jani Gravel, MD  Outpatient Primary MD for the patient is Lucia Gaskins, MD  LOS - 1   Chief Complaint  Patient presents with  . Fall        Subjective:    Tanya Velazquez today has no fevers, no emesis,  No chest pain,   -Patient with dizziness, palpitations -Noted to have systolic blood pressures in the 70s to 80s very orthostatic  Assessment  & Plan :    Principal Problem:   Syncope Active Problems:   Essential hypertension   Metatarsal fracture  1)Syncope-  on telemetry monitored unit no significant arrhythmias, patient has ruled out for ACS already by serial troponins and EKG ,  echocardiogram without significant aortic stenosis or other outflow obstruction, EF is 60 to 65% without segmental/Regional wall motion abnormalities.  carotid artery Dopplers without hemodynamically significant stenosis - Patient with dizziness, palpitations -Noted to have systolic blood pressures in the 70s to 80s very orthostatic --Suspect hypotension and orthostatic issues may have contributed to syncope PTA, -A.m. cortisol level was 13.2 --Give normal saline bolus x1 L continue to aggressively hydrate -Monitor orthostatic vitals with hydration -Lisinopril previously discontinued --??  Antipsychotic medications may be contributing to orthostatic symptoms  2)Rt Foot fractures-patient with fractures of second third and fourth metatarsal bones in the right foot, discussed with orthopedic surgeon Dr Aline Brochure, patient remains neurovascularly intact, toes appears to be aligned normally, follow-up as outpatient on 10/15/2018 WBAT- use walker   3)Neuropsych--stable, continue home Rx including Effexor 225 mg daily, Seroquel 400 mg nightly, Zyprexa 5 mg nightly, gabapentin 200  mg 3 times daily  4)COPD-stable, continue bronchodilators no acute flareup at this time  Disposition/Need for in-Hospital Stay- patient unable to be discharged at this time due to --significant hypotension and orthostatic symptoms with high risk for recurrent syncope continue IV fluids*  Code Status : Full  Family Communication:   NA (patient is alert, awake and coherent)   Disposition Plan  : HH   Consults  :  ortho  DVT Prophylaxis  :  Lovenox -  - SCDs    Lab Results  Component Value Date   PLT 189 10/08/2018    Inpatient Medications  Scheduled Meds: . cholecalciferol  1,000 Units Oral Daily  . enoxaparin (LOVENOX) injection  40 mg Subcutaneous Q24H  . umeclidinium bromide  1 puff Inhalation Daily   And  . fluticasone furoate-vilanterol  1 puff Inhalation Daily  . gabapentin  300 mg Oral TID  . influenza vac split quadrivalent PF  0.5 mL Intramuscular Tomorrow-1000  . OLANZapine  5 mg Oral QHS  . pravastatin  20 mg Oral q morning - 10a  . QUEtiapine  400 mg Oral QHS  . venlafaxine XR  225 mg Oral q morning - 10a   Continuous Infusions: PRN Meds:.acetaminophen **OR** acetaminophen, albuterol, HYDROmorphone (DILAUDID) injection, ondansetron (ZOFRAN) IV    Anti-infectives (From admission, onward)   None        Objective:   Vitals:   10/08/18 1602 10/08/18 2133 10/09/18 0620 10/09/18 0700  BP: 93/63 (!) 128/114 (!) 75/47 (!) 82/52  Pulse:  71 76 86   Resp: 16 16 16    Temp:  98.2 F (36.8 C) 98.5 F (36.9 C)   TempSrc:  Oral Oral   SpO2: 93% 93% 90%   Weight:   65.8 kg   Height:        Wt Readings from Last 3 Encounters:  10/09/18 65.8 kg  08/09/18 68.5 kg  02/23/18 69.6 kg     Intake/Output Summary (Last 24 hours) at 10/09/2018 0924 Last data filed at 10/08/2018 1800 Gross per 24 hour  Intake 240 ml  Output -  Net 240 ml     Physical Exam  Gen:- Awake Alert,  In no apparent distress  HEENT:- [place].AT, No sclera icterus Neck-Supple Neck,No  JVD,.  Lungs-  CTAB , fair symmetrical air movement CV- S1, S2 normal, regular  Abd-  +ve B.Sounds, Abd Soft, No tenderness,    Extremity/Skin:- No  edema, pedal pulses present  Psych-affect is appropriate, oriented x3 Neuro-no new focal deficits, no tremors MSK--right foot splint, good cap refill   Data Review:   Micro Results Recent Results (from the past 240 hour(s))  SARS CORONAVIRUS 2 (TAT 6-24 HRS) Nasopharyngeal Nasopharyngeal Swab     Status: None   Collection Time: 10/07/18 12:10 AM   Specimen: Nasopharyngeal Swab  Result Value Ref Range Status   SARS Coronavirus 2 NEGATIVE NEGATIVE Final    Comment: (NOTE) SARS-CoV-2 target nucleic acids are NOT DETECTED. The SARS-CoV-2 RNA is generally detectable in upper and lower respiratory specimens during the acute phase of infection. Negative results do not preclude SARS-CoV-2 infection, do not rule out co-infections with other pathogens, and should not be used as the sole basis for treatment or other patient management decisions. Negative results must be combined with clinical observations, patient history, and epidemiological information. The expected result is Negative. Fact Sheet for Patients: SugarRoll.be Fact Sheet for Healthcare Providers: https://www.woods-mathews.com/ This test is not yet approved or cleared by the [place] FDA and  has been authorized for detection and/or diagnosis of SARS-CoV-2 by FDA under an Emergency Use Authorization (EUA). This EUA will remain  in effect (meaning this test can be used) for the duration of the COVID-19 declaration under Section 56 4(b)(1) of the Act, 21 U.S.C. section 360bbb-3(b)(1), unless the authorization is terminated or revoked sooner. Performed at [facility], [place] [address] [place], [place] [address]     Radiology Reports Dg Chest 2 View  Result Date: 10/07/2018 CLINICAL DATA:  Shortness of breath. EXAM: CHEST -  2 VIEW COMPARISON:  Radiograph 08/09/2018 FINDINGS: The cardiomediastinal contours are normal. Chronic hyperinflation. Minimal subsegmental bibasilar atelectasis or scarring. Pulmonary vasculature is normal. No consolidation, pleural effusion, or pneumothorax. No acute osseous abnormalities are seen. IMPRESSION: 1. No acute abnormality. 2. Chronic hyperinflation. Electronically Signed   By: Keith Rake M.D.   On: 10/07/2018 21:57   Ct Head Wo Contrast  Result Date: 10/07/2018 CLINICAL DATA:  Last night pt got dizzy, passed out and fell. Broke 2 toes on RT foot. Sent by [facility] for evaul of syncopal episode. EXAM: CT HEAD WITHOUT CONTRAST TECHNIQUE: Contiguous axial images were obtained from the base of the skull through the vertex without intravenous contrast. COMPARISON:  None. FINDINGS: Brain: No evidence of acute infarction, hemorrhage, hydrocephalus, extra-axial collection or mass lesion/mass effect. Vascular: No hyperdense vessel or unexpected calcification. Skull: Normal. Negative for fracture or focal lesion. Sinuses/Orbits: Visualized globes and orbits are unremarkable. The visualized sinuses and mastoid air cells are clear.  Other: None. IMPRESSION: Normal unenhanced CT scan of the brain. Electronically Signed   By: Lajean Manes M.D.   On: 10/07/2018 21:58   US Carotid Bilateral  Result Date: 10/08/2018 CLINICAL DATA:  Visual disturbance. History of hypertension and diabetes. EXAM: BILATERAL CAROTID DUPLEX ULTRASOUND TECHNIQUE: Pearline Cables scale imaging, color Doppler and duplex ultrasound were performed of bilateral carotid and vertebral arteries in the neck. COMPARISON:  None. FINDINGS: Criteria: Quantification of carotid stenosis is based on velocity parameters that correlate the residual internal carotid diameter with NASCET-based stenosis levels, using the diameter of the distal internal carotid lumen as the denominator for stenosis measurement. The following velocity measurements  were obtained: RIGHT ICA: 102/43 cm/sec CCA: [id number] cm/sec SYSTOLIC ICA/CCA RATIO:  1.1 ECA: 81 cm/sec LEFT ICA: 116/39 cm/sec CCA: [id number] cm/sec SYSTOLIC ICA/CCA RATIO:  1.1 ECA: 88 cm/sec RIGHT CAROTID ARTERY: There is a minimal amount of eccentric hypoechoic plaque within the right carotid bulb (image 24), not resulting in elevated peak systolic velocities within the interrogated course of the right internal carotid artery to suggest a hemodynamically significant stenosis. RIGHT VERTEBRAL ARTERY:  Antegrade flow LEFT CAROTID ARTERY: There is no grayscale evidence of significant intimal thickening or atherosclerotic plaque affecting the interrogated portions of the left carotid system. There are no elevated peak systolic velocities within the interrogated course of the left internal carotid artery to suggest a hemodynamically significant stenosis. LEFT VERTEBRAL ARTERY:  Antegrade flow IMPRESSION: 1. Very minimal amount of right-sided atherosclerotic plaque, not resulting in a hemodynamically significant stenosis. 2. Normal sonographic evaluation of the left carotid system. Electronically Signed   By: Sandi Mariscal M.D.   On: 10/08/2018 14:48   Dg Foot Complete Right  Result Date: 10/07/2018 CLINICAL DATA:  Syncopal episode with fall today. Right foot pain. EXAM: RIGHT FOOT COMPLETE - 3+ VIEW COMPARISON:  None. FINDINGS: Displaced fractures of the second, third, and fourth distal metatarsals at the diaphyseal neck junction. Lateral displacement of distal fracture fragments. No intra-articular extension. Most frank alignment is grossly maintained. Dorsal soft tissue edema overlies the forefoot. IMPRESSION: Displaced second, third, and fourth distal metatarsal fractures. No intra-articular extension. Electronically Signed   By: Keith Rake M.D.   On: 10/07/2018 21:59     CBC Recent Labs  Lab 10/07/18 1530 10/08/18 0554  WBC 9.1 5.1  HGB 14.2 12.0  HCT 44.4 37.4  PLT 242 189  MCV 99.1 100.3*  MCH  31.7 32.2  MCHC 32.0 32.1  RDW 13.2 13.2    Chemistries  Recent Labs  Lab 10/07/18 1530 10/08/18 0554  NA 133* 137  K 3.9 4.2  CL 96* 104  CO2 25 26  GLUCOSE 172* 98  BUN 16 13  CREATININE 1.25* 1.01*  CALCIUM 10.9* 9.6  AST  --  11*  ALT  --  11  ALKPHOS  --  73  BILITOT  --  0.6   ------------------------------------------------------------------------------------------------------------------ No results for input(s): CHOL, HDL, LDLCALC, TRIG, CHOLHDL, LDLDIRECT in the last 72 hours.  No results found for: HGBA1C ------------------------------------------------------------------------------------------------------------------ No results for input(s): TSH, T4TOTAL, T3FREE, THYROIDAB in the last 72 hours.  Invalid input(s): FREET3 ------------------------------------------------------------------------------------------------------------------ No results for input(s): VITAMINB12, FOLATE, FERRITIN, TIBC, IRON, RETICCTPCT in the last 72 hours.  Coagulation profile Recent Labs  Lab 10/08/18 0554  INR 1.0    Recent Labs    10/07/18 1530  DDIMER <0.27    Cardiac Enzymes No results for input(s): CKMB, TROPONINI, MYOGLOBIN in the last 168 hours.  Invalid input(s): CK ------------------------------------------------------------------------------------------------------------------  No results found for: BNP   Roxan Hockey M.D on 10/09/2018 at 9:24 AM  Go to [email or web address] - for contact info  [facility] - Office  [phone]

## 2018-10-09 NOTE — Plan of Care (Signed)
Pt orthostatic this am with standing B/p 71/26 and HR 112, dizziness and lightheadedness with standing. Advised to call for standby assist when ambulating to BR. MD notified of vitals signs.  Problem: Health Behavior/Discharge Planning: Goal: Ability to manage health-related needs will improve Outcome: Progressing   Problem: Clinical Measurements: Goal: Ability to maintain clinical measurements within normal limits will improve Outcome: Progressing Goal: Will remain free from infection Outcome: Progressing Goal: Diagnostic test results will improve Outcome: Progressing Goal: Respiratory complications will improve Outcome: Progressing Goal: Cardiovascular complication will be avoided Outcome: Progressing   Problem: Activity: Goal: Risk for activity intolerance will decrease Outcome: Progressing   Problem: Coping: Goal: Level of anxiety will decrease Outcome: Progressing   Problem: Pain Managment: Goal: General experience of comfort will improve Outcome: Progressing   Problem: Safety: Goal: Ability to remain free from injury will improve Outcome: Progressing   Problem: Skin Integrity: Goal: Risk for impaired skin integrity will decrease Outcome: Progressing

## 2018-10-10 DIAGNOSIS — R55 Syncope and collapse: Secondary | ICD-10-CM | POA: Diagnosis not present

## 2018-10-10 MED ORDER — OXYCODONE-ACETAMINOPHEN 7.5-325 MG PO TABS
1.0000 | ORAL_TABLET | Freq: Once | ORAL | Status: AC
  Administered 2018-10-10: 1 via ORAL
  Filled 2018-10-10: qty 1

## 2018-10-10 NOTE — Progress Notes (Signed)
PT in to work with patient. 

## 2018-10-10 NOTE — Evaluation (Signed)
Physical Therapy Evaluation Patient Details Name: Tanya Velazquez MRN: [id number] DOB: 06/16/1960 Today's Date: 10/10/2018   History of Present Illness  Tanya Velazquez  is a 58 y.o. female, w hypertension, Dm2, Gerd, Migraines, chronic lower back pain,  smoker,  Copd not on home o2, apparently presents with c/o syncope while trying to get something out of cabinet in the kitchen.  When she bent down and got up had syncope preceded by dizziness, no vertigo.  Pt denies cp, palp, sob, n/v,  numbness, tingling , weakness, incontinence.  Pt injured her right foot when fell. Pt was sent by [facility] to ER.    Clinical Impression  Patient found awake and alert in bed upon entering. Patient was independent with bed mobility. Patient able to ambulate 60 feet with RW and without cueing to maintain weightbearing status. Patient was left sitting in bedside chair with all needs met. Patient would benefit from skilled physical therapy while at the hospital and at recommended venue below in order to progress patient's strength, endurance, and overall functional mobility.     Follow Up Recommendations Outpatient PT    Equipment Recommendations  Rolling walker with 5" wheels;Other (comment)(Shower cahir and RW if patient has not already been ordered a RW)    Recommendations for Other Services       Precautions / Restrictions Precautions Precautions: Fall Restrictions Weight Bearing Restrictions: Yes RLE Weight Bearing: Non weight bearing      Mobility  Bed Mobility Overal bed mobility: Independent             General bed mobility comments: Patient sat up at EOB independently.  Transfers Overall transfer level: Modified independent Equipment used: Rolling walker (2 wheeled)             General transfer comment: Patient performed sit to stand transfer from bed with Mod I using RW and independently lifting RLE to maintain NWB without  cueing.  Ambulation/Gait Ambulation/Gait assistance: Modified independent (Device/Increase time) Gait Distance (Feet): 60 Feet Assistive device: Rolling walker (2 wheeled) Gait Pattern/deviations: Step-to pattern(Hop-to pattern) Gait velocity: Decreased   General Gait Details: Decreased due to NWB hop-to gait pattern  Stairs            Wheelchair Mobility    Modified Rankin (Stroke Patients Only)       Balance Overall balance assessment: Modified Independent                                           Pertinent Vitals/Pain Pain Assessment: 0-10 Pain Score: 8  Pain Location: Head, and foot Pain Descriptors / Indicators: Throbbing Pain Intervention(s): Limited activity within patient's tolerance;Monitored during session    [facility] expects to be discharged to:: Private residence Living Arrangements: Other relatives Available Help at Discharge: Family;Available 24 hours/day Type of Home: House Home Access: Level entry     Home Layout: One level Home Equipment: None;Other (comment)(Patient reported RW has been ordered)      Prior Function Level of Independence: Independent         Comments: Patient reported being independent walking long distances     Hand Dominance        Extremity/Trunk Assessment   Upper Extremity Assessment Upper Extremity Assessment: Overall WFL for tasks assessed    Lower Extremity Assessment Lower Extremity Assessment: Generalized weakness    Cervical / Trunk Assessment Cervical /  Trunk Assessment: Normal  Communication   Communication: No difficulties  Cognition Arousal/Alertness: Awake/alert Behavior During Therapy: WFL for tasks assessed/performed Overall Cognitive Status: Within Functional Limits for tasks assessed                                        General Comments      Exercises     Assessment/Plan    PT Assessment Patient needs continued PT  services  PT Problem List Decreased strength;Decreased mobility;Decreased activity tolerance;Pain       PT Treatment Interventions DME instruction;Gait training;Stair training;Functional mobility training;Therapeutic activities;Therapeutic exercise;Balance training;Neuromuscular re-education;Patient/family education;Manual techniques    PT Goals (Current goals can be found in the Care Plan section)  Acute Rehab PT Goals Patient Stated Goal: Return home PT Goal Formulation: With patient Time For Goal Achievement: 10/17/18 Potential to Achieve Goals: Good    Frequency Min 3X/week   Barriers to discharge        Co-evaluation               AM-PAC PT "6 Clicks" Mobility  Outcome Measure Help needed turning from your back to your side while in a flat bed without using bedrails?: None Help needed moving from lying on your back to sitting on the side of a flat bed without using bedrails?: None Help needed moving to and from a bed to a chair (including a wheelchair)?: None Help needed standing up from a chair using your arms (e.g., wheelchair or bedside chair)?: None Help needed to walk in hospital room?: None Help needed climbing 3-5 steps with a railing? : A Little 6 Click Score: 23    End of Session Equipment Utilized During Treatment: Gait belt Activity Tolerance: Patient tolerated treatment well;No increased pain Patient left: in chair Nurse Communication: Mobility status PT Visit Diagnosis: Unsteadiness on feet (R26.81);Other abnormalities of gait and mobility (R26.89);History of falling (Z91.81);Muscle weakness (generalized) (M62.81)    Time: 9784-7841 PT Time Calculation (min) (ACUTE ONLY): 14 min   Charges:   PT Evaluation $PT Eval Low Complexity: 1 Low        Clarene Critchley PT, DPT 9:45 AM, 10/10/18 [phone]

## 2018-10-10 NOTE — Plan of Care (Signed)
  Problem: Acute Rehab PT Goals(only PT should resolve) Goal: Pt Will Ambulate Outcome: Progressing Flowsheets (Taken 10/10/2018 0945) Pt will Ambulate:  > 125 feet  with least restrictive assistive device  with modified independence Goal: Pt Will Go Up/Down Stairs Outcome: Progressing Flowsheets (Taken 10/10/2018 0945) Pt will Go Up / Down Stairs:  3-5 stairs  with modified independence  with least restrictive assistive device   Clarene Critchley PT, DPT 9:47 AM, 10/10/18 [phone]

## 2018-10-11 ENCOUNTER — Telehealth: Payer: Self-pay | Admitting: Orthopedic Surgery

## 2018-10-11 DIAGNOSIS — S92901A Unspecified fracture of right foot, initial encounter for closed fracture: Secondary | ICD-10-CM

## 2018-10-11 NOTE — Telephone Encounter (Signed)
Call from [facility](voice message) [phone]; states she is seeing patient; aware patient has appointment with Dr Aline Brochure 10/15/18. Asking for prescription for walker for [facility], per patient's after visit summary from Emergency room.

## 2018-10-11 NOTE — Telephone Encounter (Signed)
Printed out order for the walker, on your desk for signature

## 2018-10-15 ENCOUNTER — Other Ambulatory Visit: Payer: Self-pay

## 2018-10-15 ENCOUNTER — Encounter: Payer: Self-pay | Admitting: Orthopedic Surgery

## 2018-10-15 ENCOUNTER — Ambulatory Visit (INDEPENDENT_AMBULATORY_CARE_PROVIDER_SITE_OTHER): Payer: Medicare Other | Admitting: Orthopedic Surgery

## 2018-10-15 ENCOUNTER — Ambulatory Visit: Payer: Medicare Other

## 2018-10-15 VITALS — BP 135/111 | HR 85 | Ht 62.0 in | Wt 145.0 lb

## 2018-10-15 DIAGNOSIS — S92334A Nondisplaced fracture of third metatarsal bone, right foot, initial encounter for closed fracture: Secondary | ICD-10-CM

## 2018-10-15 DIAGNOSIS — S92324A Nondisplaced fracture of second metatarsal bone, right foot, initial encounter for closed fracture: Secondary | ICD-10-CM

## 2018-10-15 DIAGNOSIS — S92344A Nondisplaced fracture of fourth metatarsal bone, right foot, initial encounter for closed fracture: Secondary | ICD-10-CM | POA: Diagnosis not present

## 2018-10-15 NOTE — Progress Notes (Signed)
Tanya Velazquez  10/15/2018  HISTORY SECTION :  Chief Complaint  Patient presents with  . Foot Injury    right foot 10/06/2018   58 year old female was seen in the hospital with a series of metatarsal fractures after fall from syncope.  She had x-rays taken on October 07, 2018 and I saw her briefly and advised that she come into the office for full work-up.  The pain is in the right foot second third and fourth metatarsal it is mild to moderate she has had it since the 24th it is associated with swelling and painful weightbearing  A posterior splint was applied and she has been nonweightbearing since then addendum patient says she has placed some weight on the foot so we will take another x-ray today   Review of Systems  Constitutional: Negative for chills and fever.  Skin: Negative for itching and rash.  Neurological: Positive for dizziness.     has a past medical history of Anxiety, B12 deficiency anemia, Chronic constipation, Chronic constipation, Chronic low back pain, COPD (chronic obstructive pulmonary disease) ([place]), Diabetes mellitus, DJD (degenerative joint disease), GERD (gastroesophageal reflux disease) (04/13/07), H/O hiatal hernia, Hyperplastic colon polyp (06/27/2010), Hypertension, Migraines, Osteoarthritis, Raspy voice, Smokers' cough ([place]), and SUI (stress urinary incontinence, female).   Past Surgical History:  Procedure Laterality Date  . APPENDECTOMY    . BLADDER SUSPENSION  10/07/2011   Procedure: [facility] PROCEDURE;  Surgeon: Malka So, MD;  [facility];  Service: Urology;  Laterality: N/A;  1 hour requested for this case   . CARDIAC CATHETERIZATION  08-09-2004 DR Einar Gip   NORMAL CORONARY ARTERIES/  NORMAL LVF  . CARDIAC CATHETERIZATION  2006   normal coronary arteries  . COLONOSCOPY  3/31/9   Dr Rourk->friable anal canal  . COLONOSCOPY  0 06/27/10   Dr. Gala Romney hyperplastic polyp, prominent external hemorrhoid plexus. next tcs 06/2015.   . CYSTOSCOPY  10/07/2011   Procedure: CYSTOSCOPY;  Surgeon: Malka So, MD;  Location: [facility];  Service: Urology;  Laterality: N/A;  . ESOPHAGEAL MANOMETRY  2013   Dr. Derrill Kay at [facility]. incomplete bolus clearance with some breaks in contractions but there was peristalsis  . ESOPHAGOGASTRODUODENOSCOPY  06/27/10   Hiatal hernia, GERD,  . EXCISION VOLAR GANGLION LEFT WRIST  06-27-2005  . HEAD-UP TILT TABLE TEST  01-21-2007 &  11-04-2004  DR Rollene Fare   HX CHRONIC RECURRENT SYNCOPAL. EPICODES--  NEGATIVE RESULT INCLUDING ISUPREL INFUSION  . HEMORRHOID SURGERY  09-13-2003  . LAPAROSCOPIC CHOLECYSTECTOMY  02-23-2007  . LAPAROTOMY W/ APPENDCTOMY AND LEFT SALPINGO-OOPHECTOMY  AGE 43'S  . pH/impedence  2013   Dr. Derrill Kay at [facility]. Increased reflux off PPI with excellent correlation between reflux events and regurgitation. Patient advised to hold PPI for study by Dr. Derrill Kay.   Marland Kitchen RIGHT SHOULDER ARTHROSCOPY/ DISTAL CLAVICLE RESECTION / DEBRIDEMENT ROTATOR CUFF AND LABRUM TEAR/ BURSECTOMY  06-28-2010   IMPINGEMENT SYNDROME/ AC JOINT ARTHRITIS/ ROTATOR CUFF TENDINOPATHY  . SHOULDER ARTHROSCOPY  08-13-2010   LEFT SHOULDER IMPINGEMENT SYNDROME/ DJD OF AC JOINT  . STOMACH SURGERY  2013   antireflux surgery at [facility]  age 58's    Body mass index is 26.52 kg/m.   Allergies  Allergen Reactions  . Amicinonide-Benzyl Alcohol [Cyclocort] Rash  . Amoxicillin Rash  . Doxycycline Rash  . Paxil [Paroxetine Hcl] Rash  . Penicillins Rash    Has patient had a PCN reaction causing immediate rash, facial/tongue/throat swelling, SOB or lightheadedness  with hypotension: Yes Has patient had a PCN reaction causing severe rash involving mucus membranes or skin necrosis: Yes Has patient had a PCN reaction that required hospitalization: No Has patient had a PCN reaction occurring within the last 10 years: No If all of the above answers are "NO", then may proceed with  Cephalosporin use.   . Sulfa Antibiotics Rash  . Trimethoprim Rash     Current Outpatient Medications:  .  acetaminophen (TYLENOL) 325 MG tablet, Take 2 tablets (650 mg total) by mouth every 6 (six) hours as needed for mild pain (or Fever >/= 101)., Disp: 12 tablet, Rfl: 0 .  cholecalciferol (VITAMIN D) 1000 UNITS tablet, Take 1,000 Units by mouth daily., Disp: , Rfl:  .  gabapentin (NEURONTIN) 300 MG capsule, Take 300 mg by mouth 3 (three) times daily., Disp: , Rfl:  .  OLANZapine (ZYPREXA) 5 MG tablet, Take 5 mg by mouth at bedtime., Disp: , Rfl:  .  oxyCODONE (OXY IR/ROXICODONE) 5 MG immediate release tablet, Take 1 tablet (5 mg total) by mouth every 6 (six) hours as needed for severe pain., Disp: , Rfl:  .  pravastatin (PRAVACHOL) 20 MG tablet, Take 1 tablet by mouth every morning. , Disp: , Rfl:  .  PROAIR HFA 108 (90 BASE) MCG/ACT inhaler, Inhale 1 puff into the lungs every 4 (four) hours as needed for wheezing or shortness of breath. , Disp: , Rfl:  .  QUEtiapine (SEROQUEL) 400 MG tablet, Take 400 mg by mouth at bedtime., Disp: , Rfl:  .  TRELEGY ELLIPTA 100-62.5-25 MCG/INH AEPB, Inhale 1 puff into the lungs daily., Disp: , Rfl:  .  venlafaxine XR (EFFEXOR-XR) 75 MG 24 hr capsule, Take 225 mg by mouth every morning., Disp: , Rfl:  .  vortioxetine HBr (TRINTELLIX) 10 MG TABS tablet, Take 1 tablet (10 mg total) by mouth daily., Disp: , Rfl:    PHYSICAL EXAM SECTION: 1) BP (!) 135/111   Pulse 85   Ht 5\' 2"  (1.575 m)   Wt 145 lb (65.8 kg)   BMI 26.52 kg/m   Body mass index is 26.52 kg/m. General appearance: Well-developed well-nourished no gross deformities  2) Cardiovascular normal pulse and perfusion in the lower extremities normal color without edema  3) Neurologically deep tendon reflexes are equal and normal, no sensation loss or deficits no pathologic reflexes  4) Psychological: Awake alert and oriented x3 mood and affect normal  5) Skin no lacerations or ulcerations no  nodularity no palpable masses, no erythema or nodularity  6) Musculoskeletal:   Right foot inspection and palpation reveals swelling and tenderness in the second third and fourth metatarsal regions with painful range of motion of those digits no atrophy is noted in the foot the skin other than some mild discoloration from subcutaneous bleeding is intact she has good pulses normal temperature normal sensation in the foot  Compared to the left foot second third and fourth digits are aligned properly  MEDICAL DECISION SECTION:  Encounter Diagnoses  Name Primary?  . Closed nondisplaced fracture of second metatarsal bone of right foot, initial encounter Yes  . Closed nondisplaced fracture of third metatarsal bone of right foot, initial encounter   . Closed nondisplaced fracture of fourth metatarsal bone of right foot, initial encounter     Imaging Outside images from the hospital 3 views right foot second third fourth metatarsal fractures with minimal displacement  Plan:  (Rx., Inj., surg., Frx, MRI/CT, XR:2) Repeat x-ray shows that the fractures are  in stable alignment  Short cam walker  Nonweightbearing  X-ray 6 weeks     11:01 AM Arther Abbott, MD  10/15/2018

## 2018-10-15 NOTE — Patient Instructions (Addendum)
elevate the foot apply ice 30 min 4 x a day until swelling goes down   Wear boot for protection    Try to keep weight off the foot      Metatarsal Fracture A metatarsal fracture is a break in one of the five bones that connect the toes to the rest of the foot. This may also be called a forefoot fracture. A metatarsal fracture may be:  A crack in the surface of the bone (stress fracture). This often occurs in athletes.  A break all the way through the bone (complete fracture). The bone that connects to the little toe (fifth metatarsal) is most commonly fractured. Ballet dancers often fracture this bone. What are the causes? A metatarsal fracture may be caused by:  Sudden twisting of the foot.  Falling onto the foot.  Something heavy falling onto the foot.  Overuse or repetitive exercise. What increases the risk? This condition is more likely to develop in people who:  Play contact sports.  Do ballet.  Have a condition that causes the bones to become thin and brittle (osteoporosis).  Have a low calcium level. What are the signs or symptoms? Symptoms of this condition include:  Pain that gets worse when walking or standing.  Pain when pressing on the foot or moving the toes.  Swelling.  Bruising on the top or bottom of the foot. How is this diagnosed? This condition may be diagnosed based on:  Your symptoms.  Any recent foot injuries you have had.  A physical exam.  An X-ray of your foot. If you have a stress fracture, it may not show up on an X-ray, and you may need other imaging tests, such as: ? A bone scan. ? CT scan. ? MRI. How is this treated? Treatment depends on how severe your fracture is and how the pieces of the broken bone line up with each other (alignment). Treatment may involve:  Wearing a cast, splint, or supportive boot on your foot.  Using crutches, and not putting any weight on your foot.  Having surgery to align broken bones (open  reduction and internal fixation, ORIF).  Physical therapy.  Follow-up visits and X-rays to make sure you are healing. Follow these instructions at home: If you have a splint or a supportive boot:  Wear the splint or boot as told by your health care provider. Remove it only as told by your health care provider.  Loosen the splint or boot if your toes tingle, become numb, or turn cold and blue.  Keep the splint or boot clean.  If your splint or boot is not waterproof: ? Do not let it get wet. ? Cover it with a watertight covering when you take a bath or a shower. If you have a cast:  Do not stick anything inside the cast to scratch your skin. Doing that increases your risk for infection.  Check the skin around the cast every day. Tell your health care provider about any concerns.  You may put lotion on dry skin around the edges of the cast. Do not put lotion on the skin underneath the cast.  Keep the cast clean.  If the cast is not waterproof: ? Do not let it get wet. ? Cover it with a watertight covering when you take a bath or a shower. Activity  Do not use your affected leg to support your body weight until your health care provider says that you can. Use crutches as directed.  Ask  your health care provider what activities are safe for you during recovery, and ask what activities you need to avoid.  Do physical therapy exercises as directed. Driving  Do not drive or use heavy machinery while taking pain medicine.  Do not drive while wearing a cast, splint, or boot on a foot that you use for driving. Managing pain, stiffness, and swelling   If directed, put ice on painful areas: ? Put ice in a plastic bag. ? Place a towel between your skin and the bag.  If you have a removable splint or boot, remove it as told by your health care provider.  If you have a cast, place a towel between your cast and the bag. ? Leave the ice on for 20 minutes, 2-3 times a day.  Move  your toes often to avoid stiffness and to lessen swelling.  Raise (elevate) your lower leg above the level of your heart while you are sitting or lying down. General instructions  Do not put pressure on any part of the cast or splint until it is fully hardened. This may take several hours.  Take over-the-counter and prescription medicines only as told by your health care provider.  Do not use any products that contain nicotine or tobacco, such as cigarettes and e-cigarettes. These can delay bone healing. If you need help quitting, ask your health care provider.  Do not take baths, swim, or use a hot tub until your health care provider approves. Ask your health care provider if you may take showers.  Keep all follow-up visits as told by your health care provider. This is important. Contact a health care provider if you have:  Pain that gets worse or does not get better with medicine.  A fever.  A bad smell coming from your cast or splint. Get help right away if you have:  Any of the following in your toes or your foot, even after loosening your splint (if applicable): ? Numbness. ? Tingling. ? Coldness. ? Blue skin.  Redness or swelling that gets worse.  Pain that suddenly becomes severe. Summary  A metatarsal fracture is a break in one of the five bones that connect the toes to the rest of the foot.  Treatment depends on how severe your fracture is and how the pieces of the broken bone line up with each other (alignment). This may include wearing a cast, splint, or supportive boot, or using crutches. Sometimes surgery is needed to align the bones.  Ice and elevate your foot to help lessen the pain and swelling.  Make sure you know what symptoms should cause you to get help right away. This information is not intended to replace advice given to you by your health care provider. Make sure you discuss any questions you have with your health care provider. Document Released:  09/21/2001 Document Revised: 04/22/2018 Document Reviewed: 01/26/2017 Elsevier Patient Education  2020 [employer].

## 2018-10-21 ENCOUNTER — Ambulatory Visit (INDEPENDENT_AMBULATORY_CARE_PROVIDER_SITE_OTHER): Payer: Medicare Other | Admitting: Otolaryngology

## 2018-10-27 ENCOUNTER — Telehealth: Payer: Self-pay | Admitting: Orthopedic Surgery

## 2018-10-27 NOTE — Telephone Encounter (Signed)
Call received via voice message today, 10/27/18, 3:40pm from [place] at [facility]. States patient has again declined appointments for home visits this week. Said she had declined last week aslo, states relayed reasons of either "boot got wet," or "everyone in the house has a fever". Her direct ph# [phone].  Patient is listed in their records under AKA Tanya Velazquez

## 2018-10-27 NOTE — Telephone Encounter (Signed)
Patient called to first inquire about being seen sooner than her scheduled appointment for her foot fracture, as her toes are "not seeming right".  I offered the first available 11/08/18 at 8:30am, which patient is concerned that may still be too far off.  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  She went on to say that her pinky toe and other toe don't look right. Please advise. Home# is best, ph [phone].

## 2018-10-27 NOTE — Telephone Encounter (Signed)
Done

## 2018-10-28 ENCOUNTER — Other Ambulatory Visit: Payer: Self-pay

## 2018-10-28 ENCOUNTER — Encounter: Payer: Self-pay | Admitting: Orthopedic Surgery

## 2018-10-28 ENCOUNTER — Ambulatory Visit: Payer: Medicare Other

## 2018-10-28 ENCOUNTER — Ambulatory Visit (INDEPENDENT_AMBULATORY_CARE_PROVIDER_SITE_OTHER): Payer: Medicare Other | Admitting: Orthopedic Surgery

## 2018-10-28 DIAGNOSIS — S92334D Nondisplaced fracture of third metatarsal bone, right foot, subsequent encounter for fracture with routine healing: Secondary | ICD-10-CM

## 2018-10-28 DIAGNOSIS — S92324D Nondisplaced fracture of second metatarsal bone, right foot, subsequent encounter for fracture with routine healing: Secondary | ICD-10-CM

## 2018-10-28 DIAGNOSIS — S92344D Nondisplaced fracture of fourth metatarsal bone, right foot, subsequent encounter for fracture with routine healing: Secondary | ICD-10-CM | POA: Diagnosis not present

## 2018-10-28 MED ORDER — NAPROXEN 500 MG PO TABS: 500.0000 mg | ORAL_TABLET | Freq: Two times a day (BID) | ORAL | 2 refills | Status: DC

## 2018-10-28 NOTE — Telephone Encounter (Signed)
When this afternoon?

## 2018-10-28 NOTE — Telephone Encounter (Signed)
ok 

## 2018-10-28 NOTE — Progress Notes (Signed)
Chief Complaint  Patient presents with  . Foot Pain    10/06/2018 right foot has gotten worse     58 year old female had a fracture on September 24, second third and fourth metacarpal fractures.  She says that she has had increased pain in her foot.  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

## 2018-10-28 NOTE — Telephone Encounter (Signed)
She has to come in this afternoon or it will be at regular appt

## 2018-10-29 ENCOUNTER — Telehealth: Payer: Self-pay

## 2018-10-29 NOTE — Telephone Encounter (Signed)
I called her, she is closing case, patient has declined therapy.

## 2018-10-29 NOTE — Telephone Encounter (Signed)
Margaretmary Eddy with [facility] left a message for you to please call her about this patient.

## 2018-11-03 ENCOUNTER — Telehealth: Payer: Self-pay | Admitting: Orthopedic Surgery

## 2018-11-03 NOTE — Telephone Encounter (Signed)
Done (patient seen as scheduled, 10/28/18).

## 2018-11-03 NOTE — Telephone Encounter (Signed)
Patient had left voice message this afternoon, called patient back; relays she must have stepped on something, as her boot has a hole in it now; said she also had a small cut where boot was broken open, which is fine. Please advise regarding boot replacement. Ph# [phone] or [phone] [facility])

## 2018-11-04 NOTE — Telephone Encounter (Signed)
Replace when we have someone in the office to replace it

## 2018-11-04 NOTE — Telephone Encounter (Signed)
I called her to see if she wants to come in now, or if she wants to come by tomorrow. Left message for her to call me back

## 2018-11-05 NOTE — Telephone Encounter (Signed)
Tanya Velazquez called back and asked about coming in.  She said she is unable to come today as she is at voting booth and will be unable to get here before we close at 12:00.  She states she will come on Monday in the late morning.

## 2018-11-05 NOTE — Telephone Encounter (Signed)
Monday is fine, here all day, just as long as she does not come in during lunch.

## 2018-11-26 ENCOUNTER — Other Ambulatory Visit: Payer: Self-pay

## 2018-11-26 ENCOUNTER — Ambulatory Visit: Payer: Medicare Other

## 2018-11-26 ENCOUNTER — Ambulatory Visit (INDEPENDENT_AMBULATORY_CARE_PROVIDER_SITE_OTHER): Payer: Medicare Other | Admitting: Orthopedic Surgery

## 2018-11-26 VITALS — BP 113/72 | HR 67 | Temp 97.2°F | Wt 151.8 lb

## 2018-11-26 DIAGNOSIS — S92324D Nondisplaced fracture of second metatarsal bone, right foot, subsequent encounter for fracture with routine healing: Secondary | ICD-10-CM | POA: Diagnosis not present

## 2018-11-26 DIAGNOSIS — S92334D Nondisplaced fracture of third metatarsal bone, right foot, subsequent encounter for fracture with routine healing: Secondary | ICD-10-CM

## 2018-11-26 DIAGNOSIS — S92344D Nondisplaced fracture of fourth metatarsal bone, right foot, subsequent encounter for fracture with routine healing: Secondary | ICD-10-CM | POA: Diagnosis not present

## 2018-11-26 NOTE — Progress Notes (Signed)
Chief Complaint  Patient presents with  . Foot Pain    right foot/toes fx f/u    Multiple fractures of the right foot second third and fourth metatarsal  Still having some lateral pain she is in a postop shoe able to walk a little bit better  X-rays were taken  Clinical exam shows that she does have normal alignment of the digits with tenderness over the fourth metatarsal second and third seem to be okay  X-rays of the right foot  There are multiple fractures in the foot second third and fourth metatarsals at the distal portion  The patient has made abundant callus around all of these fractures.  There is no malalignment of any significance.  Impression healed fractures of the distal metatarsals  Encounter Diagnoses  Name Primary?  . Closed nondisplaced fracture of second metatarsal bone of right foot with routine healing, subsequent encounter 10/06/2018 Yes  . Closed nondisplaced fracture of third metatarsal bone of right foot with routine healing, subsequent encounter   . Closed nondisplaced fracture of fourth metatarsal bone of right foot with routine healing, subsequent encounter     Recommend continued postop shoe okay to try to get into a regular shoe check again clinically in 4 weeks

## 2018-12-14 ENCOUNTER — Other Ambulatory Visit (HOSPITAL_COMMUNITY): Payer: Self-pay | Admitting: Family Medicine

## 2018-12-14 ENCOUNTER — Other Ambulatory Visit: Payer: Self-pay

## 2018-12-14 ENCOUNTER — Ambulatory Visit (HOSPITAL_COMMUNITY)
Admission: RE | Admit: 2018-12-14 | Discharge: 2018-12-14 | Disposition: A | Discharge status: Home / Self Care | Payer: Medicare Other | Source: Ambulatory Visit | Attending: Family Medicine | Admitting: Family Medicine

## 2018-12-14 DIAGNOSIS — S0590XA Unspecified injury of unspecified eye and orbit, initial encounter: Secondary | ICD-10-CM | POA: Diagnosis not present

## 2018-12-22 ENCOUNTER — Other Ambulatory Visit: Payer: Self-pay

## 2018-12-22 ENCOUNTER — Ambulatory Visit (INDEPENDENT_AMBULATORY_CARE_PROVIDER_SITE_OTHER): Payer: Medicare Other | Admitting: Orthopedic Surgery

## 2018-12-22 ENCOUNTER — Encounter: Payer: Self-pay | Admitting: Orthopedic Surgery

## 2018-12-22 VITALS — BP 135/80 | HR 76 | Temp 96.6°F | Ht 62.0 in | Wt 151.0 lb

## 2018-12-22 DIAGNOSIS — G5761 Lesion of plantar nerve, right lower limb: Secondary | ICD-10-CM

## 2018-12-22 DIAGNOSIS — S92324D Nondisplaced fracture of second metatarsal bone, right foot, subsequent encounter for fracture with routine healing: Secondary | ICD-10-CM | POA: Diagnosis not present

## 2018-12-22 DIAGNOSIS — S92344D Nondisplaced fracture of fourth metatarsal bone, right foot, subsequent encounter for fracture with routine healing: Secondary | ICD-10-CM

## 2018-12-22 DIAGNOSIS — S92334D Nondisplaced fracture of third metatarsal bone, right foot, subsequent encounter for fracture with routine healing: Secondary | ICD-10-CM

## 2018-12-22 NOTE — Patient Instructions (Signed)

## 2018-12-22 NOTE — Progress Notes (Signed)
Chief Complaint  Patient presents with  . Follow-up    Recheck on righ tfoot fracture, DOI 10-06-18.    Tanya Velazquez had a fracture of multiple bones in her foot and although the fracture is healed she continued to have pain so we kept her in a postop shoe.  She still having pain in the fourth and the third digit little numbness and tingling.  Fracture was in September 2020  Review of systems otherwise normal  Exam shows a normally aligned foot positive compression test of the digits positive metatarsal compression test with some reproduction of the symptoms between the third and fourth digit alignment looks normal no other neurovascular deficits there is no atrophy in the foot  She is otherwise awake alert and oriented x3 mood and affect are normal  Recommend injection for neuroma  Injection procedure note Verbal consent given to inject the third and fourth digit right foot Site confirmed Alcohol prep Ethyl chloride for anesthesia  25-gauge needle  3 cc of medication with Depo-Medrol 40 mg and lidocaine Injected no complications  Recommend follow-up if no improvement

## 2019-01-04 ENCOUNTER — Other Ambulatory Visit: Payer: Self-pay | Admitting: Orthopedic Surgery

## 2019-01-04 DIAGNOSIS — S92344D Nondisplaced fracture of fourth metatarsal bone, right foot, subsequent encounter for fracture with routine healing: Secondary | ICD-10-CM

## 2019-01-04 DIAGNOSIS — S92324D Nondisplaced fracture of second metatarsal bone, right foot, subsequent encounter for fracture with routine healing: Secondary | ICD-10-CM

## 2019-01-04 DIAGNOSIS — S92334D Nondisplaced fracture of third metatarsal bone, right foot, subsequent encounter for fracture with routine healing: Secondary | ICD-10-CM

## 2019-01-20 ENCOUNTER — Encounter: Payer: Self-pay | Admitting: Internal Medicine

## 2019-01-27 ENCOUNTER — Other Ambulatory Visit: Payer: Medicare Other

## 2019-01-28 ENCOUNTER — Other Ambulatory Visit: Payer: Self-pay

## 2019-01-28 ENCOUNTER — Ambulatory Visit: Payer: Medicare Other | Attending: Internal Medicine

## 2019-01-28 DIAGNOSIS — Z20822 Contact with and (suspected) exposure to covid-19: Secondary | ICD-10-CM

## 2019-01-29 LAB — NOVEL CORONAVIRUS, NAA: SARS-CoV-2, NAA: NOT DETECTED

## 2019-02-01 ENCOUNTER — Telehealth: Payer: Self-pay | Admitting: Family Medicine

## 2019-02-01 NOTE — Telephone Encounter (Signed)
Patient called in and received her negative covid test result  

## 2019-02-03 ENCOUNTER — Ambulatory Visit: Payer: Medicare Other | Admitting: Gastroenterology

## 2019-02-03 ENCOUNTER — Encounter: Payer: Self-pay | Admitting: Gastroenterology

## 2019-02-03 NOTE — Progress Notes (Signed)
Referring Provider: Lucia Gaskins, MD Primary Care Physician:  Lucia Gaskins, MD Primary Gastroenterologist:  Dr. Gala Romney  Chief Complaint  Patient presents with  . Rectal Bleeding    mother had colon cancer  . Rectal Pain    has to strain when starting to have BM    HPI:   Tanya Velazquez is a 59 y.o. female presenting today at the request of Lucia Gaskins, MD for rectal bleeding.  Last colonoscopy in 2012 with single hyperplastic polyp, prominent external hemorrhoid plexus likely source of hematochezia, long tortuous colon, otherwise normal.  Was due for repeat colonoscopy in 2017. GI history of chronic constipation, refractory GERD failing multiple PPIs.  S/p antireflux surgery which helped but still required PPI therapy thereafter.  Also with history of elevated LFTs and dilated CBD at 9 mm with mild dilation of intrahepatic ducts noted on ultrasound in 2016.    Last seen in our office in March 2016 for further evaluation of abnormal LFTs, CBD dilation, intrahepatic duct dilation, lower abdominal pain, RUQ pain, and follow-up of GERD.  GERD was stable with Dexilant twice daily.  Several week history of bilateral lower abdominal pain, constant, worse with meals.  Also with history of right upper quadrant abdominal pain.  Labs from PCP dated February 2016 remarkable for alk phos 165H, AST 75H, ALT 45H.  Labs completed on 04/10/2014 with CBC normal, CMP with LFTs returned to normal. Plans for CT A/P with contrast to allow diverticulitis and evaluate biliary dilation with elevated LFTs. CT was scheduled for 05/01/2014 but this was not completed.  LFTs have remained normal since 2016 with last labs on 10/08/2018.  Today: Rectal bleeding started 3 weeks ago. Bright red. In the toilet water and on toilet tissue. With every BM. Having 1-2 BM a day. When stools first start, it feels like it is hard, but once it starts feels like it will come on out. Having to strain to have a BM. This is  new within the last week. Stool looks hard. Has a lot of cracks in it. [place] 3. Has been taking senna and this isn't helping much. Has also taken a laxative. Was on Linzess 290 mcg in the past which worked well. PCP took her off of this about 1 year, not sure why. No diarrhea when on Linzess. BMs had remained fairly normal until about 1 month ago. Has some abdominal pain that feels like it goes down to her rectum intermittently. May go couple days without pain. Typically worse before a BM. Improves some after a BM.  Also with rectal burning and some sharp rectal pain when having a BM.  No nausea or vomiting. No reflux symptoms. Not on a PPI. As long as she avoids spicy foods, she has no trouble. No dysphagia.   Mom passed from colon cancer. Was diagnosed at 12 and passed at age 40.    Had labs on Friday with PCP. Not sure what was ordered.   Has some dizziness when getting up and she will be leaning sideways when walking. Will feel like the room is spinning. This is not new. Present for several months. Has some lightheadedness with this. No fever or chills. No cold or flu likely symptoms. No chest pain. Chronic intermittent heart palpitations. Some SOB with exertion, none at rest. Cough typically in the morning. No cold or flu like symptoms. No urinary symptoms.   Has been trying to lose weight. Walking more.   Takes Aleve about daily. Has chronic  back and shoulder pain.   Past Medical History:  Diagnosis Date  . Anxiety   . B12 deficiency anemia   . Chronic constipation   . Chronic constipation   . Chronic low back pain    lumbar radiculopathy  . COPD (chronic obstructive pulmonary disease) ([place])   . Diabetes mellitus   . DJD (degenerative joint disease)   . GERD (gastroesophageal reflux disease) 04/13/07   EGD Dr Rourk->patulous EG junction, HH, antral erosions  . H/O hiatal hernia   . Hyperplastic colon polyp 06/27/2010  . Hypertension   . Migraines   . Osteoarthritis   . Raspy  voice   . Smokers' cough ([place])   . SUI (stress urinary incontinence, female)     Past Surgical History:  Procedure Laterality Date  . APPENDECTOMY    . BLADDER SUSPENSION  10/07/2011   Procedure: [facility] PROCEDURE;  Surgeon: Malka So, MD;  Location: [facility];  Service: Urology;  Laterality: N/A;  1 hour requested for this case   . CARDIAC CATHETERIZATION  08-09-2004 DR Einar Gip   NORMAL CORONARY ARTERIES/  NORMAL LVF  . CARDIAC CATHETERIZATION  2006   normal coronary arteries  . COLONOSCOPY  3/31/9   Dr Rourk->friable anal canal  . COLONOSCOPY  0 06/27/10   Dr. Gala Romney hyperplastic polyp, prominent external hemorrhoid plexus likely source of hematochezia, long tortuous colon. next tcs 06/2015.  . CYSTOSCOPY  10/07/2011   Procedure: CYSTOSCOPY;  Surgeon: Malka So, MD;  Location: [facility];  Service: Urology;  Laterality: N/A;  . ESOPHAGEAL MANOMETRY  2013   Dr. Derrill Kay at [facility]. incomplete bolus clearance with some breaks in contractions but there was peristalsis  . ESOPHAGOGASTRODUODENOSCOPY  06/27/2010   Schatzki ring s/p dilation, small hiatal hernia, couple tiny antral erosions.  Marland Kitchen EXCISION VOLAR GANGLION LEFT WRIST  06-27-2005  . HEAD-UP TILT TABLE TEST  01-21-2007 &  11-04-2004  DR Rollene Fare   HX CHRONIC RECURRENT SYNCOPAL. EPICODES--  NEGATIVE RESULT INCLUDING ISUPREL INFUSION  . HEMORRHOID SURGERY  09-13-2003  . LAPAROSCOPIC CHOLECYSTECTOMY  02-23-2007  . LAPAROTOMY W/ APPENDCTOMY AND LEFT SALPINGO-OOPHECTOMY  AGE 66'S  . pH/impedence  2013   Dr. Derrill Kay at [facility]. Increased reflux off PPI with excellent correlation between reflux events and regurgitation. Patient advised to hold PPI for study by Dr. Derrill Kay.   Marland Kitchen RIGHT SHOULDER ARTHROSCOPY/ DISTAL CLAVICLE RESECTION / DEBRIDEMENT ROTATOR CUFF AND LABRUM TEAR/ BURSECTOMY  06-28-2010   IMPINGEMENT SYNDROME/ AC JOINT ARTHRITIS/ ROTATOR CUFF TENDINOPATHY  . SHOULDER ARTHROSCOPY  08-13-2010   LEFT  SHOULDER IMPINGEMENT SYNDROME/ DJD OF AC JOINT  . STOMACH SURGERY  2013   antireflux surgery at [place]  age 81's    Current Outpatient Medications  Medication Sig Dispense Refill  . acetaminophen (TYLENOL) 325 MG tablet Take 2 tablets (650 mg total) by mouth every 6 (six) hours as needed for mild pain (or Fever >/= 101). 12 tablet 0  . cholecalciferol (VITAMIN D) 1000 UNITS tablet Take 1,000 Units by mouth daily.    Marland Kitchen gabapentin (NEURONTIN) 300 MG capsule Take 300 mg by mouth 3 (three) times daily.    . naproxen (NAPROSYN) 500 MG tablet TAKE 1 TABLET BY MOUTH TWICE DAILY WITH A MEAL.. 60 tablet 0  . OLANZapine (ZYPREXA) 5 MG tablet Take 5 mg by mouth at bedtime.    Marland Kitchen oxyCODONE (OXY IR/ROXICODONE) 5 MG immediate release tablet Take 1 tablet (5 mg total) by mouth every 6 (six)  hours as needed for severe pain.    . pravastatin (PRAVACHOL) 20 MG tablet Take 1 tablet by mouth every morning.     Marland Kitchen PROAIR HFA 108 (90 BASE) MCG/ACT inhaler Inhale 1 puff into the lungs every 4 (four) hours as needed for wheezing or shortness of breath.     . QUEtiapine (SEROQUEL) 400 MG tablet Take 400 mg by mouth at bedtime.    . TRELEGY ELLIPTA 100-62.5-25 MCG/INH AEPB Inhale 1 puff into the lungs daily.    Marland Kitchen venlafaxine XR (EFFEXOR-XR) 75 MG 24 hr capsule Take 225 mg by mouth every morning.    . vitamin B-12 (CYANOCOBALAMIN) 100 MCG tablet Take 100 mcg by mouth daily.    . hydrocortisone (ANUSOL-HC) 2.5 % rectal cream Place 1 application rectally 2 (two) times daily. 30 g 1   No current facility-administered medications for this visit.    Allergies as of 02/04/2019 - Review Complete 02/04/2019  Allergen Reaction Noted  . Amicinonide-benzyl alcohol [cyclocort] Rash 06/20/2010  . Amoxicillin Rash 10/01/2011  . Doxycycline Rash 06/20/2010  . Paxil [paroxetine hcl] Rash 05/29/2016  . Penicillins Rash 03/10/2011  . Sulfa antibiotics Rash 04/10/2014  . Trimethoprim Rash 06/20/2010     Family History  Problem Relation Age of Onset  . Colon cancer Mother 74  . Hypertension Mother   . Heart disease Mother 1       CABG  . Alzheimer's disease Father   . Hypertension Father   . Healthy Daughter   . Heart failure Sister   . Heart Problems Sister        Cardiac stent    Social History   Socioeconomic History  . Marital status: Divorced    Spouse name: Not on file  . Number of children: 3  . Years of education: Not on file  . Highest education level: Not on file  Occupational History  . Occupation: disabled    Fish farm manager: UNEMPLOYED  Tobacco Use  . Smoking status: Current Some Day Smoker    Packs/day: 1.00    Years: 35.00    Pack years: 35.00    Types: Cigarettes  . Smokeless tobacco: Never Used  Substance and Sexual Activity  . Alcohol use: No    Alcohol/week: 0.0 standard drinks  . Drug use: No  . Sexual activity: Yes    Birth control/protection: Surgical  Other Topics Concern  . Not on file  Social History Narrative   Lives alone in a one story home.  Has 2 children.     On disability since 2007 for syncope.  Did work in Char[employer] fundraiser.     Education: high school.   Social Determinants of Health   Financial Resource Strain:   . Difficulty of Paying Living Expenses: Not on file  Food Insecurity:   . Worried About Charity fundraiser in the Last Year: Not on file  . Ran Out of Food in the Last Year: Not on file  Transportation Needs:   . Lack of Transportation (Medical): Not on file  . Lack of Transportation (Non-Medical): Not on file  Physical Activity:   . Days of Exercise per Week: Not on file  . Minutes of Exercise per Session: Not on file  Stress:   . Feeling of Stress : Not on file  Social Connections:   . Frequency of Communication with Friends and Family: Not on file  . Frequency of Social Gatherings with Friends and Family: Not on file  . Attends Religious Services: Not on  file  . Active Member of Clubs or Organizations: Not on file   . Attends Archivist Meetings: Not on file  . Marital Status: Not on file  Intimate Partner Violence:   . Fear of Current or Ex-Partner: Not on file  . Emotionally Abused: Not on file  . Physically Abused: Not on file  . Sexually Abused: Not on file    Review of Systems: Gen: See HPI CV: See HPI Resp: See HPI GI: See HPI GU : See HPI MS: See HPI Derm: Denies rash Psych: Some anxiety/depression. Has changed medications recently.  Heme: See HPI  Physical Exam: BP 106/74   Pulse 87   Temp (!) 96.9 F (36.1 C) (Temporal)   Ht '5\' 2"'  (1.575 m)   Wt 152 lb (68.9 kg)   BMI 27.80 kg/m  General:   Alert and oriented. Pleasant and cooperative. Well-nourished and well-developed.  Head:  Normocephalic and atraumatic. Eyes:  Without icterus, sclera clear and conjunctiva pink.  Ears:  Normal auditory acuity. Lungs:  Clear to auscultation bilaterally. No wheezes, rales, or rhonchi. No distress.  Heart:  S1, S2 present without murmurs appreciated.  Abdomen:  +BS, soft, non-tender and non-distended. No HSM noted. No guarding or rebound. No masses appreciated.  Rectal: Patient declined.  Msk:  Symmetrical without gross deformities. Normal posture. Extremities:  Without edema. Neurologic:  Alert and  oriented x4;  grossly normal neurologically. Skin:  Intact without significant lesions or rashes. Psych: Normal mood and affect.

## 2019-02-04 ENCOUNTER — Ambulatory Visit (INDEPENDENT_AMBULATORY_CARE_PROVIDER_SITE_OTHER): Payer: Medicare Other | Admitting: Gastroenterology

## 2019-02-04 ENCOUNTER — Encounter: Payer: Self-pay | Admitting: Gastroenterology

## 2019-02-04 ENCOUNTER — Other Ambulatory Visit: Payer: Self-pay

## 2019-02-04 VITALS — BP 106/74 | HR 87 | Temp 96.9°F | Ht 62.0 in | Wt 152.0 lb

## 2019-02-04 DIAGNOSIS — K625 Hemorrhage of anus and rectum: Secondary | ICD-10-CM | POA: Diagnosis not present

## 2019-02-04 DIAGNOSIS — K6289 Other specified diseases of anus and rectum: Secondary | ICD-10-CM | POA: Diagnosis not present

## 2019-02-04 DIAGNOSIS — R109 Unspecified abdominal pain: Secondary | ICD-10-CM | POA: Diagnosis not present

## 2019-02-04 DIAGNOSIS — K5900 Constipation, unspecified: Secondary | ICD-10-CM

## 2019-02-04 DIAGNOSIS — Z800 Family history of malignant neoplasm of digestive organs: Secondary | ICD-10-CM

## 2019-02-04 MED ORDER — HYDROCORTISONE (PERIANAL) 2.5 % EX CREA: 1.0000 "application " | TOPICAL_CREAM | Freq: Two times a day (BID) | CUTANEOUS | 1 refills | Status: DC

## 2019-02-04 NOTE — Assessment & Plan Note (Signed)
Addressed under rectal bleeding 

## 2019-02-04 NOTE — Assessment & Plan Note (Signed)
History of chronic constipation.  Used to be on Linzess 290 mcg daily.  PCP stopped this about 1 year ago.  Patient is not sure why.  Stools have remained fairly normal until about 1 month ago.  Currently having 1-2 BMs a day but stools are hard and require straining. Associated intermittent abdominal pain that radiates down to her rectum, worse prior to BMs and improves some thereafter. Not daily. Also with rectal bleeding with BMs starting 3 weeks ago. Some rectal pain/burning with BM.  Last colonoscopy in 2012 with single hyperplastic polyp, prominent external hemorrhoid plexus.  She was due for repeat colonoscopy in 2017.  Family history significant for mom with colon cancer diagnosed at age 56. Abdominal exam benign. Patient declined rectal exam.   Start Linzess 145 mcg daily on empty stomach, only 30 minutes prior to first meal.  Samples provided.  Patient is to call in 1 week with an update.  If this works well, we will send in a prescription. Suspect abdominal pain will improve as constipation improves. She was advised to call if pain became persistent or more severe.  Suspect rectal bleeding may be related to hemorrhoids.  We will send in Anusol cream.  Instructed to use this twice daily x7-10 days as needed. Proceed with TCS with propofol with Dr. Gala Romney in the near future. The risks, benefits, and alternatives have been discussed in detail with patient. They have stated understanding and desire to proceed. Propofol due to polypharmacy.  Request recent lab work from PCP.  Per patient, she completed labs 1 week ago. Follow-up after procedure.

## 2019-02-04 NOTE — Patient Instructions (Signed)
We will provide samples of Linzess 145 mcg today.  Take this daily on empty stomach, least 30 minutes before your first meal.  Call in 1 week with an update.  If this works well, I will send in a prescription.  If not, we can try the higher dose.  I suspect your abdominal pain will improve as her constipation improves.  If this becomes persistent or more severe, please let us know.  Drink enough water to keep urine pale yellow to clear.  For rectal bleeding, I will send in Anusol cream.  Apply this twice daily per your rectum for the next 7-10 days.  We will get you scheduled for a colonoscopy with Dr. Gala Romney in the near future to evaluate your rectal bleeding.  I am requesting your recent lab work from her PCP.  If you have profuse rectal bleeding, have significant fatigue, feel you will pass out, you should proceed to the emergency room.  We will follow up with you after your colonoscopy.  Call if you have questions or concerns prior.  Aliene Altes, PA-C [facility]

## 2019-02-04 NOTE — Assessment & Plan Note (Deleted)
Addressed under rectal bleeding 

## 2019-02-04 NOTE — Assessment & Plan Note (Addendum)
59 year old female with rectal bleeding x3 weeks in the setting of constipation.  Bright red blood in toilet water and on toilet tissue.  She has history of constipation but has not been on Linzess for about 1 year.  Bowels had been moving regularly until about 1 month ago when she developed hard stools requiring straining.  Associated intermittent abdominal pain that radiates down to her rectum, worse prior to BMs and improves some thereafter.  Also with rectal pain/burning with BMs. Abdominal exam is benign. Patient declined rectal exam. Last colonoscopy in 2012 with single hyperplastic polyp, prominent external hemorrhoid plexus which was suspected to be the source of hematochezia at that time. Was due for repeat TCS in 2017. Family history significant for mom with colon cancer diagnosed at age 34.  Suspect rectal bleeding is likely secondary to hemorrhoids in setting of constipation; however, cannot rule out polyps or malignancy. Abdominal pain likely secondary to constipation.   Proceed with TCS with propofol with Dr. Gala Romney in the near future. The risks, benefits, and alternatives have been discussed in detail with patient. They have stated understanding and desire to proceed.  Propofol due to polypharmacy. Start Linzess 145 mcg daily on empty stomach, 30 minutes for first meal.  Samples provided. Drink enough water to keep urine pale yellow to clear. Monitor abdominal pain. Call if worsening symptoms.  Start Anusol cream twice daily x7-10 days then as needed. Request recent labs from PCP.  Per patient, labs completed 1 week ago. Advised if she were to have profuse rectal bleeding, significant fatigue, feel she would pass out, she should proceed to the emergency room. Follow-up after procedure.

## 2019-02-04 NOTE — Assessment & Plan Note (Signed)
Addressed under constipation.  

## 2019-02-07 ENCOUNTER — Other Ambulatory Visit: Payer: Self-pay

## 2019-02-07 ENCOUNTER — Telehealth: Payer: Self-pay

## 2019-02-07 MED ORDER — CLENPIQ 10-3.5-12 MG-GM -GM/160ML PO SOLN: 1.0000 | Freq: Once | ORAL | 0 refills | Status: AC

## 2019-02-07 NOTE — Telephone Encounter (Signed)
Pre-op and COVID test 04/05/19. Appt letter mailed with procedure instructions.

## 2019-02-07 NOTE — Telephone Encounter (Signed)
Called pt, TCS w/Propofol w/RMR scheduled for 04/07/19 at 2::00pm. Rx for prep sent to pharmacy. Orders entered.

## 2019-02-09 ENCOUNTER — Telehealth: Payer: Self-pay | Admitting: Internal Medicine

## 2019-02-09 NOTE — Telephone Encounter (Signed)
[phone]  Patient called and said that the lizess samples are not working and would like to try something else.

## 2019-02-09 NOTE — Telephone Encounter (Signed)
Noted  

## 2019-02-09 NOTE — Telephone Encounter (Signed)
Spoke with pt. She hasn't passed any stool, hasn't had any n/v and pt is passing gas. Pt is going to start the Miralax purge with an enema. Pt is going to stop by the office tomorrow to get samples of Linzess 290 mcg.

## 2019-02-09 NOTE — Telephone Encounter (Signed)
Pt was seen on 02/04/2019. Pt is taking Linzess 145 mcg and states she hasn't had a bowel movement since the day before her apt. Pt is having some lower abdominal pain and feels she needs to have a bowel movement. Pt has been straining trying to get something out. Pt has noticed blood in the toilet and on her tissue when she wipes. Pt is using Anusol cream twice daily as directed and see's less than a tsp of blood in the toilet.

## 2019-02-09 NOTE — Telephone Encounter (Signed)
Routing to [facility] 

## 2019-02-09 NOTE — Telephone Encounter (Signed)
Has she passed any stool? Any nausea or vomiting? Is she passing gas?   If she is still passing gas and is without nausea or vomiting, she can try a MiraLAX purge as well as use an enema to help loosen the stool that is in the lower part of her colon. If she is having any of the above symptoms, we need to do an abdominal x-ray to rule out obstruction.   1. MiraLAX Purge: MiraLAX 1 capful (17g) in 8 oz of water every hour until her bowel are moving well up to 5 doses.  2. She needs to go ahead and use an enema at the same time she starts the MiraLAX purge.  3. She can also come by the office and pick up Linzess 290 mcg samples to see if these will work better.

## 2019-02-14 ENCOUNTER — Other Ambulatory Visit (HOSPITAL_COMMUNITY): Payer: Self-pay | Admitting: Family Medicine

## 2019-02-14 DIAGNOSIS — N63 Unspecified lump in unspecified breast: Secondary | ICD-10-CM

## 2019-02-15 ENCOUNTER — Other Ambulatory Visit (HOSPITAL_COMMUNITY): Payer: Self-pay | Admitting: Family Medicine

## 2019-02-15 DIAGNOSIS — N63 Unspecified lump in unspecified breast: Secondary | ICD-10-CM

## 2019-02-17 ENCOUNTER — Other Ambulatory Visit: Payer: Self-pay

## 2019-02-17 NOTE — Patient Instructions (Signed)
No PA needed for TCS per [facility] website. Decision ID# [id number].

## 2019-02-22 ENCOUNTER — Ambulatory Visit (HOSPITAL_COMMUNITY)
Admission: RE | Admit: 2019-02-22 | Discharge: 2019-02-22 | Disposition: A | Discharge status: Home / Self Care | Payer: Medicare Other | Source: Ambulatory Visit | Attending: Family Medicine | Admitting: Family Medicine

## 2019-02-22 ENCOUNTER — Other Ambulatory Visit: Payer: Self-pay

## 2019-02-22 ENCOUNTER — Ambulatory Visit (HOSPITAL_COMMUNITY): Payer: Medicare Other

## 2019-02-22 DIAGNOSIS — N63 Unspecified lump in unspecified breast: Secondary | ICD-10-CM

## 2019-02-22 DIAGNOSIS — N6310 Unspecified lump in the right breast, unspecified quadrant: Secondary | ICD-10-CM | POA: Diagnosis not present

## 2019-02-22 DIAGNOSIS — Z1239 Encounter for other screening for malignant neoplasm of breast: Secondary | ICD-10-CM | POA: Diagnosis not present

## 2019-03-31 NOTE — Patient Instructions (Signed)
Tanya Velazquez  03/31/2019     @PREFPERIOPPHARMACY @   Your procedure is scheduled on  04/07/2019 .  Report to [facility] at  1230  P.M.  Call this number if you have problems the morning of surgery:  [phone]   Remember:  Follow the diet and prep instructions given to you by Dr Roseanne Kaufman office.                      Take these medicines the morning of surgery with A SIP OF WATER  Gabapentin, lisinopril, oxycodone(if needed), effexor. Use your nebulizer and your inhaler before you come. DO NOT take any medications for diabetes the morning of your procedure.    Do not wear jewelry, make-up or nail polish.  Do not wear lotions, powders, or perfume. Please wear deodorant and brush your teeth.  Do not shave 48 hours prior to surgery.  Men may shave face and neck.  Do not bring valuables to the hospital.  [facility] is not responsible for any belongings or valuables.  Contacts, dentures or bridgework may not be worn into surgery.  Leave your suitcase in the car.  After surgery it may be brought to your room.  For patients admitted to the hospital, discharge time will be determined by your treatment team.  Patients discharged the day of surgery will not be allowed to drive home.   Name and phone number of your driver:   family Special instructions:  DO NOT smoke the morning of your procedure.  Please read over the following fact sheets that you were given. Anesthesia Post-op Instructions and Care and Recovery After Surgery       Colonoscopy, Adult, Care After This sheet gives you information about how to care for yourself after your procedure. Your health care provider may also give you more specific instructions. If you have problems or questions, contact your health care provider. What can I expect after the procedure? After the procedure, it is common to have:  A small amount of blood in your stool for 24 hours after the procedure.  Some gas.  Mild cramping or  bloating of your abdomen. Follow these instructions at home: Eating and drinking   Drink enough fluid to keep your urine pale yellow.  Follow instructions from your health care provider about eating or drinking restrictions.  Resume your normal diet as instructed by your health care provider. Avoid heavy or fried foods that are hard to digest. Activity  Rest as told by your health care provider.  Avoid sitting for a long time without moving. Get up to take short walks every 1-2 hours. This is important to improve blood flow and breathing. Ask for help if you feel weak or unsteady.  Return to your normal activities as told by your health care provider. Ask your health care provider what activities are safe for you. Managing cramping and bloating   Try walking around when you have cramps or feel bloated.  Apply heat to your abdomen as told by your health care provider. Use the heat source that your health care provider recommends, such as a moist heat pack or a heating pad. ? Place a towel between your skin and the heat source. ? Leave the heat on for 20-30 minutes. ? Remove the heat if your skin turns bright red. This is especially important if you are unable to feel pain, heat, or cold. You may have a greater risk  of getting burned. General instructions  For the first 24 hours after the procedure: ? Do not drive or use machinery. ? Do not sign important documents. ? Do not drink alcohol. ? Do your regular daily activities at a slower pace than normal. ? Eat soft foods that are easy to digest.  Take over-the-counter and prescription medicines only as told by your health care provider.  Keep all follow-up visits as told by your health care provider. This is important. Contact a health care provider if:  You have blood in your stool 2-3 days after the procedure. Get help right away if you have:  More than a small spotting of blood in your stool.  Large blood clots in your  stool.  Swelling of your abdomen.  Nausea or vomiting.  A fever.  Increasing pain in your abdomen that is not relieved with medicine. Summary  After the procedure, it is common to have a small amount of blood in your stool. You may also have mild cramping and bloating of your abdomen.  For the first 24 hours after the procedure, do not drive or use machinery, sign important documents, or drink alcohol.  Get help right away if you have a lot of blood in your stool, nausea or vomiting, a fever, or increased pain in your abdomen. This information is not intended to replace advice given to you by your health care provider. Make sure you discuss any questions you have with your health care provider. Document Revised: 07/26/2018 Document Reviewed: 07/26/2018 Elsevier Patient Education  [place] After These instructions provide you with information about caring for yourself after your procedure. Your health care provider may also give you more specific instructions. Your treatment has been planned according to current medical practices, but problems sometimes occur. Call your health care provider if you have any problems or questions after your procedure. What can I expect after the procedure? After your procedure, you may:  Feel sleepy for several hours.  Feel clumsy and have poor balance for several hours.  Feel forgetful about what happened after the procedure.  Have poor judgment for several hours.  Feel nauseous or vomit.  Have a sore throat if you had a breathing tube during the procedure. Follow these instructions at home: For at least 24 hours after the procedure:      Have a responsible adult stay with you. It is important to have someone help care for you until you are awake and alert.  Rest as needed.  Do not: ? Participate in activities in which you could fall or become injured. ? Drive. ? Use heavy machinery. ? Drink  alcohol. ? Take sleeping pills or medicines that cause drowsiness. ? Make important decisions or sign legal documents. ? Take care of children on your own. Eating and drinking  Follow the diet that is recommended by your health care provider.  If you vomit, drink water, juice, or soup when you can drink without vomiting.  Make sure you have little or no nausea before eating solid foods. General instructions  Take over-the-counter and prescription medicines only as told by your health care provider.  If you have sleep apnea, surgery and certain medicines can increase your risk for breathing problems. Follow instructions from your health care provider about wearing your sleep device: ? Anytime you are sleeping, including during daytime naps. ? While taking prescription pain medicines, sleeping medicines, or medicines that make you drowsy.  If you smoke, do  not smoke without supervision.  Keep all follow-up visits as told by your health care provider. This is important. Contact a health care provider if:  You keep feeling nauseous or you keep vomiting.  You feel light-headed.  You develop a rash.  You have a fever. Get help right away if:  You have trouble breathing. Summary  For several hours after your procedure, you may feel sleepy and have poor judgment.  Have a responsible adult stay with you for at least 24 hours or until you are awake and alert. This information is not intended to replace advice given to you by your health care provider. Make sure you discuss any questions you have with your health care provider. Document Revised: 03/30/2017 Document Reviewed: 04/22/2015 Elsevier Patient Education  [place].

## 2019-04-05 ENCOUNTER — Other Ambulatory Visit: Payer: Self-pay

## 2019-04-05 ENCOUNTER — Other Ambulatory Visit (HOSPITAL_COMMUNITY)
Admission: RE | Admit: 2019-04-05 | Discharge: 2019-04-05 | Disposition: A | Discharge status: Home / Self Care | Payer: Medicare Other | Source: Ambulatory Visit | Attending: Internal Medicine | Admitting: Internal Medicine

## 2019-04-05 ENCOUNTER — Encounter (HOSPITAL_COMMUNITY)
Admission: RE | Admit: 2019-04-05 | Discharge: 2019-04-05 | Disposition: A | Discharge status: Home / Self Care | Payer: Medicare Other | Source: Ambulatory Visit | Attending: Internal Medicine | Admitting: Internal Medicine

## 2019-04-05 DIAGNOSIS — Z800 Family history of malignant neoplasm of digestive organs: Secondary | ICD-10-CM | POA: Diagnosis not present

## 2019-04-05 DIAGNOSIS — F419 Anxiety disorder, unspecified: Secondary | ICD-10-CM | POA: Diagnosis not present

## 2019-04-05 DIAGNOSIS — D128 Benign neoplasm of rectum: Secondary | ICD-10-CM | POA: Diagnosis not present

## 2019-04-05 DIAGNOSIS — F329 Major depressive disorder, single episode, unspecified: Secondary | ICD-10-CM | POA: Diagnosis not present

## 2019-04-05 DIAGNOSIS — K5730 Diverticulosis of large intestine without perforation or abscess without bleeding: Secondary | ICD-10-CM | POA: Diagnosis not present

## 2019-04-05 DIAGNOSIS — J449 Chronic obstructive pulmonary disease, unspecified: Secondary | ICD-10-CM | POA: Diagnosis not present

## 2019-04-05 DIAGNOSIS — Z20822 Contact with and (suspected) exposure to covid-19: Secondary | ICD-10-CM | POA: Diagnosis not present

## 2019-04-05 DIAGNOSIS — Z8249 Family history of ischemic heart disease and other diseases of the circulatory system: Secondary | ICD-10-CM | POA: Diagnosis not present

## 2019-04-05 DIAGNOSIS — K649 Unspecified hemorrhoids: Secondary | ICD-10-CM | POA: Diagnosis not present

## 2019-04-05 DIAGNOSIS — I10 Essential (primary) hypertension: Secondary | ICD-10-CM | POA: Diagnosis not present

## 2019-04-05 DIAGNOSIS — F17210 Nicotine dependence, cigarettes, uncomplicated: Secondary | ICD-10-CM | POA: Diagnosis not present

## 2019-04-05 DIAGNOSIS — E119 Type 2 diabetes mellitus without complications: Secondary | ICD-10-CM | POA: Diagnosis not present

## 2019-04-05 DIAGNOSIS — K921 Melena: Secondary | ICD-10-CM | POA: Diagnosis present

## 2019-04-05 DIAGNOSIS — K219 Gastro-esophageal reflux disease without esophagitis: Secondary | ICD-10-CM | POA: Diagnosis not present

## 2019-04-05 DIAGNOSIS — Z560 Unemployment, unspecified: Secondary | ICD-10-CM | POA: Diagnosis not present

## 2019-04-05 LAB — BASIC METABOLIC PANEL
Anion gap: 8 (ref 5–15)
BUN: 14 mg/dL (ref 6–20)
CO2: 27 mmol/L (ref 22–32)
Calcium: 10 mg/dL (ref 8.9–10.3)
Chloride: 105 mmol/L (ref 98–111)
Creatinine, Ser: 1.1 mg/dL — ABNORMAL HIGH (ref 0.44–1.00)
GFR calc Af Amer: >60 mL/min (ref >60)
GFR calc non Af Amer: 55 mL/min — ABNORMAL LOW (ref >60)
Glucose, Bld: 133 mg/dL — ABNORMAL HIGH (ref 70–99)
Potassium: 3.8 mmol/L (ref 3.5–5.1)
Sodium: 140 mmol/L (ref 135–145)

## 2019-04-05 LAB — CBC WITH DIFFERENTIAL/PLATELET
Abs Immature Granulocytes: 0.01 10*3/uL (ref 0.00–0.07)
Basophils Absolute: 0 10*3/uL (ref 0.0–0.1)
Basophils Relative: 1 %
Eosinophils Absolute: 0.3 10*3/uL (ref 0.0–0.5)
Eosinophils Relative: 6 %
HCT: 37.6 % (ref 36.0–46.0)
Hemoglobin: 11.7 g/dL — ABNORMAL LOW (ref 12.0–15.0)
Immature Granulocytes: 0 %
Lymphocytes Relative: 31 %
Lymphs Abs: 1.5 10*3/uL (ref 0.7–4.0)
MCH: 31.1 pg (ref 26.0–34.0)
MCHC: 31.1 g/dL (ref 30.0–36.0)
MCV: 100 fL (ref 80.0–100.0)
Monocytes Absolute: 0.3 10*3/uL (ref 0.1–1.0)
Monocytes Relative: 7 %
Neutro Abs: 2.6 10*3/uL (ref 1.7–7.7)
Neutrophils Relative %: 55 %
Platelets: 219 10*3/uL (ref 150–400)
RBC: 3.76 MIL/uL — ABNORMAL LOW (ref 3.87–5.11)
RDW: 13.2 % (ref 11.5–15.5)
WBC: 4.8 10*3/uL (ref 4.0–10.5)
nRBC: 0 % (ref 0.0–0.2)

## 2019-04-05 LAB — SARS CORONAVIRUS 2 (TAT 6-24 HRS): SARS Coronavirus 2: NEGATIVE

## 2019-04-06 ENCOUNTER — Telehealth: Payer: Self-pay | Admitting: *Deleted

## 2019-04-06 NOTE — Telephone Encounter (Signed)
Called pt to see if she can move up on schedule for procedure tomorrow. She is going to call back to let me know.

## 2019-04-06 NOTE — Telephone Encounter (Signed)
Patient called back and not able to move up on schedule

## 2019-04-07 ENCOUNTER — Encounter (HOSPITAL_COMMUNITY): Payer: Self-pay | Admitting: Internal Medicine

## 2019-04-07 ENCOUNTER — Encounter: Payer: Self-pay | Admitting: Internal Medicine

## 2019-04-07 ENCOUNTER — Ambulatory Visit (HOSPITAL_COMMUNITY): Payer: Medicare Other | Admitting: Anesthesiology

## 2019-04-07 ENCOUNTER — Encounter (HOSPITAL_COMMUNITY)
Admission: RE | Disposition: A | Discharge status: Home / Self Care | Payer: Self-pay | Source: Home / Self Care | Attending: Internal Medicine

## 2019-04-07 ENCOUNTER — Ambulatory Visit (HOSPITAL_COMMUNITY)
Admission: RE | Admit: 2019-04-07 | Discharge: 2019-04-07 | Disposition: A | Discharge status: Home / Self Care | Payer: Medicare Other | Attending: Internal Medicine | Admitting: Internal Medicine

## 2019-04-07 DIAGNOSIS — D128 Benign neoplasm of rectum: Secondary | ICD-10-CM | POA: Insufficient documentation

## 2019-04-07 DIAGNOSIS — K635 Polyp of colon: Secondary | ICD-10-CM

## 2019-04-07 DIAGNOSIS — K5730 Diverticulosis of large intestine without perforation or abscess without bleeding: Secondary | ICD-10-CM | POA: Insufficient documentation

## 2019-04-07 DIAGNOSIS — K649 Unspecified hemorrhoids: Secondary | ICD-10-CM | POA: Diagnosis not present

## 2019-04-07 DIAGNOSIS — Z8249 Family history of ischemic heart disease and other diseases of the circulatory system: Secondary | ICD-10-CM | POA: Insufficient documentation

## 2019-04-07 DIAGNOSIS — Z20822 Contact with and (suspected) exposure to covid-19: Secondary | ICD-10-CM | POA: Diagnosis not present

## 2019-04-07 DIAGNOSIS — K621 Rectal polyp: Secondary | ICD-10-CM | POA: Diagnosis not present

## 2019-04-07 DIAGNOSIS — K219 Gastro-esophageal reflux disease without esophagitis: Secondary | ICD-10-CM | POA: Insufficient documentation

## 2019-04-07 DIAGNOSIS — F419 Anxiety disorder, unspecified: Secondary | ICD-10-CM | POA: Insufficient documentation

## 2019-04-07 DIAGNOSIS — F17210 Nicotine dependence, cigarettes, uncomplicated: Secondary | ICD-10-CM | POA: Insufficient documentation

## 2019-04-07 DIAGNOSIS — E119 Type 2 diabetes mellitus without complications: Secondary | ICD-10-CM | POA: Insufficient documentation

## 2019-04-07 DIAGNOSIS — Z560 Unemployment, unspecified: Secondary | ICD-10-CM | POA: Insufficient documentation

## 2019-04-07 DIAGNOSIS — I10 Essential (primary) hypertension: Secondary | ICD-10-CM | POA: Insufficient documentation

## 2019-04-07 DIAGNOSIS — F329 Major depressive disorder, single episode, unspecified: Secondary | ICD-10-CM | POA: Insufficient documentation

## 2019-04-07 DIAGNOSIS — Z800 Family history of malignant neoplasm of digestive organs: Secondary | ICD-10-CM | POA: Insufficient documentation

## 2019-04-07 DIAGNOSIS — J449 Chronic obstructive pulmonary disease, unspecified: Secondary | ICD-10-CM | POA: Insufficient documentation

## 2019-04-07 HISTORY — PX: COLONOSCOPY WITH PROPOFOL: SHX5780

## 2019-04-07 HISTORY — PX: POLYPECTOMY: SHX5525

## 2019-04-07 LAB — GLUCOSE, CAPILLARY: Glucose-Capillary: 92 mg/dL (ref 70–99)

## 2019-04-07 SURGERY — COLONOSCOPY WITH PROPOFOL
Anesthesia: General

## 2019-04-07 MED ORDER — KETAMINE HCL 10 MG/ML IJ SOLN
INTRAMUSCULAR | Status: DC | PRN
  Administered 2019-04-07: 20 mg via INTRAVENOUS

## 2019-04-07 MED ORDER — CHLORHEXIDINE GLUCONATE CLOTH 2 % EX PADS: 6.0000 | MEDICATED_PAD | Freq: Once | CUTANEOUS | Status: DC

## 2019-04-07 MED ORDER — PROPOFOL 500 MG/50ML IV EMUL
INTRAVENOUS | Status: DC | PRN
  Administered 2019-04-07 (×2): 20 mg via INTRAVENOUS
  Administered 2019-04-07: 50 mg via INTRAVENOUS
  Administered 2019-04-07 (×6): 20 mg via INTRAVENOUS

## 2019-04-07 MED ORDER — LACTATED RINGERS IV SOLN: INTRAVENOUS | Status: DC

## 2019-04-07 MED ORDER — KETAMINE HCL 50 MG/5ML IJ SOSY
PREFILLED_SYRINGE | INTRAMUSCULAR | Status: AC
  Filled 2019-04-07: qty 5

## 2019-04-07 NOTE — H&P (Signed)
@LOGO @   Primary Care Physician:  Lucia Gaskins, MD Primary Gastroenterologist:  Dr. Gala Romney  Pre-Procedure History & Physical: HPI:  Tanya Velazquez is a 59 y.o. female here for further evaluation of rectal bleeding via colonoscopy. Patient states Linzess has helped her constipation significantly.  Continues to have paper hematochezia.  Family history significant for mother diagnosed and succumbed to colon cancer in her 90s.  Past Medical History:  Diagnosis Date  . Anxiety   . B12 deficiency anemia   . Chronic constipation   . Chronic constipation   . Chronic low back pain    lumbar radiculopathy  . COPD (chronic obstructive pulmonary disease) ([place])   . Diabetes mellitus   . DJD (degenerative joint disease)   . GERD (gastroesophageal reflux disease) 04/13/07   EGD Dr Janavia Rottman->patulous EG junction, HH, antral erosions  . H/O hiatal hernia   . Hyperplastic colon polyp 06/27/2010  . Hypertension   . Migraines   . Osteoarthritis   . Raspy voice   . Smokers' cough ([place])   . SUI (stress urinary incontinence, female)     Past Surgical History:  Procedure Laterality Date  . APPENDECTOMY    . BLADDER SUSPENSION  10/07/2011   Procedure: [facility] PROCEDURE;  Surgeon: Malka So, MD;  Location: [facility];  Service: Urology;  Laterality: N/A;  1 hour requested for this case   . CARDIAC CATHETERIZATION  08-09-2004 DR Einar Gip   NORMAL CORONARY ARTERIES/  NORMAL LVF  . CARDIAC CATHETERIZATION  2006   normal coronary arteries  . COLONOSCOPY  3/31/9   Dr Eliberto Sole->friable anal canal  . COLONOSCOPY  0 06/27/10   Dr. Gala Romney hyperplastic polyp, prominent external hemorrhoid plexus likely source of hematochezia, long tortuous colon. next tcs 06/2015.  . CYSTOSCOPY  10/07/2011   Procedure: CYSTOSCOPY;  Surgeon: Malka So, MD;  Location: [facility];  Service: Urology;  Laterality: N/A;  . ESOPHAGEAL MANOMETRY  2013   Dr. Derrill Kay at [facility]. incomplete bolus  clearance with some breaks in contractions but there was peristalsis  . ESOPHAGOGASTRODUODENOSCOPY  06/27/2010   Schatzki ring s/p dilation, small hiatal hernia, couple tiny antral erosions.  Marland Kitchen EXCISION VOLAR GANGLION LEFT WRIST  06-27-2005  . HEAD-UP TILT TABLE TEST  01-21-2007 &  11-04-2004  DR Rollene Fare   HX CHRONIC RECURRENT SYNCOPAL. EPICODES--  NEGATIVE RESULT INCLUDING ISUPREL INFUSION  . HEMORRHOID SURGERY  09-13-2003  . LAPAROSCOPIC CHOLECYSTECTOMY  02-23-2007  . LAPAROTOMY W/ APPENDCTOMY AND LEFT SALPINGO-OOPHECTOMY  AGE 23'S  . pH/impedence  2013   Dr. Derrill Kay at [facility]. Increased reflux off PPI with excellent correlation between reflux events and regurgitation. Patient advised to hold PPI for study by Dr. Derrill Kay.   Marland Kitchen RIGHT SHOULDER ARTHROSCOPY/ DISTAL CLAVICLE RESECTION / DEBRIDEMENT ROTATOR CUFF AND LABRUM TEAR/ BURSECTOMY  06-28-2010   IMPINGEMENT SYNDROME/ AC JOINT ARTHRITIS/ ROTATOR CUFF TENDINOPATHY  . SHOULDER ARTHROSCOPY  08-13-2010   LEFT SHOULDER IMPINGEMENT SYNDROME/ DJD OF AC JOINT  . STOMACH SURGERY  2013   antireflux surgery at [place]  age 52's    Prior to Admission medications   Medication Sig Start Date End Date Taking? Authorizing Provider  acetaminophen (TYLENOL) 325 MG tablet Take 2 tablets (650 mg total) by mouth every 6 (six) hours as needed for mild pain (or Fever >/= 101). 10/08/18  Yes Emokpae, Courage, MD  Ascorbic Acid (VITAMIN C PO) Take 1 tablet by mouth daily.   Yes [provider]  Cholecalciferol (  VITAMIN D3) 50 MCG (2000 UT) TABS Take 2,000 Units by mouth daily.   Yes [provider]  ELDERBERRY PO Take 50 mg by mouth daily.   Yes [provider]  gabapentin (NEURONTIN) 300 MG capsule Take 900 mg by mouth daily.    Yes [provider]  hydrocortisone (ANUSOL-HC) 2.5 % rectal cream Place 1 application rectally 2 (two) times daily. Patient taking differently: Place 1 application rectally 2  (two) times daily as needed for hemorrhoids (discomfort).  02/04/19  Yes Erenest Rasher, PA-C  ipratropium-albuterol (DUONEB) 0.5-2.5 (3) MG/3ML SOLN Inhale 3 mLs into the lungs every 6 (six) hours as needed for wheezing or shortness of breath. 03/10/19  Yes [provider]  lisinopril (ZESTRIL) 20 MG tablet Take 20 mg by mouth daily.   Yes [provider]  Multiple Vitamin (MULTIVITAMIN WITH MINERALS) TABS tablet Take 1 tablet by mouth daily.   Yes [provider]  oxyCODONE (OXY IR/ROXICODONE) 5 MG immediate release tablet Take 1 tablet (5 mg total) by mouth every 6 (six) hours as needed for severe pain. Patient taking differently: Take 5 mg by mouth in the morning, at noon, in the evening, and at bedtime.  08/10/18  Yes Barton Dubois, MD  pravastatin (PRAVACHOL) 20 MG tablet Take 20 mg by mouth daily.  12/17/12  Yes [provider]  PROAIR HFA 108 (90 BASE) MCG/ACT inhaler Inhale 1 puff into the lungs every 4 (four) hours as needed for wheezing or shortness of breath.  05/13/10  Yes [provider]  QUEtiapine (SEROQUEL) 400 MG tablet Take 600 mg by mouth at bedtime.  02/25/18  Yes [provider]  TRELEGY ELLIPTA 100-62.5-25 MCG/INH AEPB Inhale 1 puff into the lungs daily. 03/11/18  Yes [provider]  venlafaxine XR (EFFEXOR-XR) 75 MG 24 hr capsule Take 225 mg by mouth daily with breakfast.    Yes [provider]  vitamin B-12 (CYANOCOBALAMIN) 500 MCG tablet Take 500 mcg by mouth daily.   Yes [provider]  CLENPIQ 10-3.5-12 MG-GM -GM/160ML SOLN Take 320 mLs by mouth once. 02/07/19   [provider]  naproxen (NAPROSYN) 500 MG tablet TAKE 1 TABLET BY MOUTH TWICE DAILY WITH A MEAL.Marland Kitchen Patient taking differently: Take 500 mg by mouth in the morning and at bedtime.  01/04/19   Carole Civil, MD    Allergies as of 02/07/2019 - Review Complete 02/04/2019  Allergen Reaction Noted  . Amicinonide-benzyl  alcohol [cyclocort] Rash 06/20/2010  . Amoxicillin Rash 10/01/2011  . Doxycycline Rash 06/20/2010  . Paxil [paroxetine hcl] Rash 05/29/2016  . Penicillins Rash 03/10/2011  . Sulfa antibiotics Rash 04/10/2014  . Trimethoprim Rash 06/20/2010    Family History  Problem Relation Age of Onset  . Colon cancer Mother 69  . Hypertension Mother   . Heart disease Mother 42       CABG  . Alzheimer's disease Father   . Hypertension Father   . Healthy Daughter   . Heart failure Sister   . Heart Problems Sister        Cardiac stent    Social History   Socioeconomic History  . Marital status: Divorced    Spouse name: Not on file  . Number of children: 3  . Years of education: Not on file  . Highest education level: Not on file  Occupational History  . Occupation: disabled    Fish farm manager: UNEMPLOYED  Tobacco Use  . Smoking status: Current Some Day Smoker  Packs/day: 1.00    Years: 35.00    Pack years: 35.00    Types: Cigarettes  . Smokeless tobacco: Never Used  Substance and Sexual Activity  . Alcohol use: No    Alcohol/week: 0.0 standard drinks  . Drug use: No  . Sexual activity: Yes    Birth control/protection: Surgical  Other Topics Concern  . Not on file  Social History Narrative   Lives alone in a one story home.  Has 2 children.     On disability since 2007 for syncope.  Did work in Charity fundraiser.     Education: high school.   Social Determinants of Health   Financial Resource Strain:   . Difficulty of Paying Living Expenses:   Food Insecurity:   . Worried About Charity fundraiser in the Last Year:   . Arboriculturist in the Last Year:   Transportation Needs:   . Film/video editor (Medical):   Marland Kitchen Lack of Transportation (Non-Medical):   Physical Activity:   . Days of Exercise per Week:   . Minutes of Exercise per Session:   Stress:   . Feeling of Stress :   Social Connections:   . Frequency of Communication with Friends and Family:   . Frequency of Social  Gatherings with Friends and Family:   . Attends Religious Services:   . Active Member of Clubs or Organizations:   . Attends Archivist Meetings:   Marland Kitchen Marital Status:   Intimate Partner Violence:   . Fear of Current or Ex-Partner:   . Emotionally Abused:   Marland Kitchen Physically Abused:   . Sexually Abused:     Review of Systems: See HPI, otherwise negative ROS  Physical Exam: BP 124/78   Pulse 67   Temp 98.5 F (36.9 C) (Oral)   Resp 18   Ht 5\' 2"  (1.575 m)   Wt 71.7 kg   SpO2 98%   BMI 28.91 kg/m  General:   Alert,  Well-developed, well-nourished, pleasant and cooperative in NAD Neck:  Supple; no masses or thyromegaly. No significant cervical adenopathy. Lungs:  Clear throughout to auscultation.   No wheezes, crackles, or rhonchi. No acute distress. Heart:  Regular rate and rhythm; no murmurs, clicks, rubs,  or gallops. Abdomen: Non-distended, normal bowel sounds.  Soft and nontender without appreciable mass or hepatosplenomegaly.  Pulses:  Normal pulses noted. Extremities:  Without clubbing or edema.  Impression/Plan: 59 year old lady with painless rectal bleeding.  History of known hemorrhoids.  History of chronic constipation. Here for diagnostic colonoscopy per plan.  The risks, benefits, limitations, alternatives and imponderables have been reviewed with the patient. Questions have been answered. All parties are agreeable.      Notice: This dictation was prepared with Dragon dictation along with smaller phrase technology. Any transcriptional errors that result from this process are unintentional and may not be corrected upon review.

## 2019-04-07 NOTE — Op Note (Signed)
[facility] Patient Name: Tanya Velazquez Procedure Date: 04/07/2019 1:17 PM MRN: [id number] Date of Birth: [birth year] Attending MD: Norvel Richards , MD CSN: [id number] Age: 59 Admit Type: Outpatient Procedure:                Colonoscopy Indications:              Hematochezia Providers:                Norvel Richards, MD, Jeanann Lewandowsky. Sharon Seller, RN,                            Nelma Rothman, Technician Referring MD:              Medicines:                Propofol per Anesthesia Complications:            No immediate complications. Estimated Blood Loss:     Estimated blood loss was minimal. Procedure:                Pre-Anesthesia Assessment:                           - Prior to the procedure, a History and Physical                            was performed, and patient medications and                            allergies were reviewed. The patient's tolerance of                            previous anesthesia was also reviewed. The risks                            and benefits of the procedure and the sedation                            options and risks were discussed with the patient.                            All questions were answered, and informed consent                            was obtained. Prior Anticoagulants: The patient has                            taken no previous anticoagulant or antiplatelet                            agents. ASA Grade Assessment: III - A patient with                            severe systemic disease. After reviewing the risks  and benefits, the patient was deemed in                            satisfactory condition to undergo the procedure.                           After obtaining informed consent, the colonoscope                            was passed under direct vision. Throughout the                            procedure, the patient's blood pressure, pulse, and                            oxygen saturations  were monitored continuously. The                            [id number] scope was introduced through                            the anus and advanced to the the cecum, identified                            by appendiceal orifice and ileocecal valve. The                            colonoscopy was performed without difficulty. The                            patient tolerated the procedure well. The quality                            of the bowel preparation was adequate. Scope In: 1:26:49 PM Scope Out: 1:43:17 PM Scope Withdrawal Time: 0 hours 10 minutes 34 seconds  Total Procedure Duration: 0 hours 16 minutes 28 seconds  Findings:      Hemorrhoids were found on perianal exam. Grade 3 hemorrhoids. Easily       reduced. Easily prolapse.      Scattered small-mouthed diverticula were found in the sigmoid colon and       descending colon.      Three sessile polyps were found in the rectum, mid rectum and descending       colon. The polyps were 3 to 7 mm in size. These polyps were removed with       a cold snare. Resection and retrieval were complete. Estimated blood       loss was minimal.      The exam was otherwise without abnormality on direct and retroflexion       views. Impression:               - Hemorrhoids found on perianal exam.                           - Diverticulosis in the sigmoid colon and in the  descending colon.                           - Three 3 to 7 mm polyps in the rectum, in the mid                            rectum and in the descending colon, removed with a                            cold snare. Resected and retrieved.                           - The examination was otherwise normal on direct                            and retroflexion views. Bleeding likely from                            hemorrhoids. Moderate Sedation:      Moderate (conscious) sedation was personally administered by an       anesthesia professional. The  following parameters were monitored: oxygen       saturation, heart rate, blood pressure, respiratory rate, EKG, adequacy       of pulmonary ventilation, and response to care. Recommendation:           - Patient has a contact number available for                            emergencies. The signs and symptoms of potential                            delayed complications were discussed with the                            patient. Return to normal activities tomorrow.                            Written discharge instructions were provided to the                            patient.                           - Advance diet as tolerated.                           - Continue present medications.                           - Repeat colonoscopy date to be determined after                            pending pathology results are reviewed for                            surveillance based  on pathology results.                           - Return to GI office in 6 weeks. Be a reasonably                            good hemorrhoid banding candidate. Would need                            multiple sessions. Information on hemorrhoid                            banding provided to the patient. Procedure Code(s):        --- Professional ---                           [phone], Colonoscopy, flexible; with removal of                            tumor(s), polyp(s), or other lesion(s) by snare                            technique Diagnosis Code(s):        --- Professional ---                           K64.9, Unspecified hemorrhoids                           K62.1, Rectal polyp                           K63.5, Polyp of colon                           K92.1, Melena (includes Hematochezia)                           K57.30, Diverticulosis of large intestine without                            perforation or abscess without bleeding CPT copyright 2019 American Medical Association. All rights reserved. The codes documented  in this report are preliminary and upon coder review may  be revised to meet current compliance requirements. Cristopher Estimable. Halah Whiteside, MD Norvel Richards, MD 04/07/2019 1:56:27 PM This report has been signed electronically. Number of Addenda: 0

## 2019-04-07 NOTE — Anesthesia Preprocedure Evaluation (Signed)
Anesthesia Evaluation  Patient identified by MRN, date of birth, ID band Patient awake    Reviewed: Allergy & Precautions, H&P , NPO status , Patient's Chart, lab work & pertinent test results, reviewed documented beta blocker date and time   Airway Mallampati: II  TM Distance: >3 FB Neck ROM: full    Dental no notable dental hx.    Pulmonary COPD,  COPD inhaler, Current Smoker,    Pulmonary exam normal breath sounds clear to auscultation       Cardiovascular Exercise Tolerance: Good hypertension, negative cardio ROS   Rhythm:regular Rate:Normal     Neuro/Psych  Headaches, PSYCHIATRIC DISORDERS Anxiety Depression    GI/Hepatic Neg liver ROS, hiatal hernia, GERD  Medicated,  Endo/Other  negative endocrine ROSdiabetes, Type 2  Renal/GU CRFRenal disease  negative genitourinary   Musculoskeletal   Abdominal   Peds  Hematology  (+) Blood dyscrasia, anemia ,   Anesthesia Other Findings   Reproductive/Obstetrics negative OB ROS                             Anesthesia Physical Anesthesia Plan  ASA: II  Anesthesia Plan: General   Post-op Pain Management:    Induction:   PONV Risk Score and Plan: 2 and Propofol infusion  Airway Management Planned:   Additional Equipment:   Intra-op Plan:   Post-operative Plan:   Informed Consent: I have reviewed the patients History and Physical, chart, labs and discussed the procedure including the risks, benefits and alternatives for the proposed anesthesia with the patient or authorized representative who has indicated his/her understanding and acceptance.     Dental Advisory Given  Plan Discussed with: CRNA  Anesthesia Plan Comments:         Anesthesia Quick Evaluation

## 2019-04-07 NOTE — Anesthesia Postprocedure Evaluation (Signed)
Anesthesia Post Note  Patient: Tanya Velazquez  Procedure(s) Performed: COLONOSCOPY WITH PROPOFOL (N/A ) POLYPECTOMY  Patient location during evaluation: PACU Anesthesia Type: General Level of consciousness: awake and alert Pain management: pain level controlled Vital Signs Assessment: post-procedure vital signs reviewed and stable Respiratory status: spontaneous breathing, nonlabored ventilation, respiratory function stable and patient connected to nasal cannula oxygen Cardiovascular status: stable and blood pressure returned to baseline Postop Assessment: no apparent nausea or vomiting Anesthetic complications: no     Last Vitals:  Vitals:   04/07/19 1308 04/07/19 1353  BP: 124/78 98/60  Pulse:  64  Resp: 18 18  Temp:  36.8 C  SpO2: 98% 99%    Last Pain:  Vitals:   04/07/19 1353  TempSrc:   PainSc: (P) 0-No pain                 Mayur Duman

## 2019-04-07 NOTE — Transfer of Care (Signed)
Immediate Anesthesia Transfer of Care Note  Patient: Tanya Velazquez  Procedure(s) Performed: COLONOSCOPY WITH PROPOFOL (N/A ) POLYPECTOMY  Patient Location: PACU  Anesthesia Type:MAC  Level of Consciousness: awake, alert , oriented and patient cooperative  Airway & Oxygen Therapy: Patient Spontanous Breathing and Patient connected to nasal cannula oxygen  Post-op Assessment: Report given to RN, Post -op Vital signs reviewed and stable and Patient moving all extremities X 4  Post vital signs: Reviewed and stable  Last Vitals:  Vitals Value Taken Time  BP 98/60 04/07/19 1350  Temp    Pulse 63 04/07/19 1351  Resp 18 04/07/19 1351  SpO2 99 % 04/07/19 1351  Vitals shown include unvalidated device data.  Last Pain:  Vitals:   04/07/19 1320  TempSrc:   PainSc: 7       Patients Stated Pain Goal: 6 ([id number])  Complications: No apparent anesthesia complications

## 2019-04-07 NOTE — Discharge Instructions (Signed)
Colonoscopy Discharge Instructions  Read the instructions outlined below and refer to this sheet in the next few weeks. These discharge instructions provide you with general information on caring for yourself after you leave the hospital. Your doctor may also give you specific instructions. While your treatment has been planned according to the most current medical practices available, unavoidable complications occasionally occur. If you have any problems or questions after discharge, call Dr. Gala Romney at [phone]. ACTIVITY  You may resume your regular activity, but move at a slower pace for the next 24 hours.   Take frequent rest periods for the next 24 hours.   Walking will help get rid of the air and reduce the bloated feeling in your belly (abdomen).   No driving for 24 hours (because of the medicine (anesthesia) used during the test).    Do not sign any important legal documents or operate any machinery for 24 hours (because of the anesthesia used during the test).  NUTRITION  Drink plenty of fluids.   You may resume your normal diet as instructed by your doctor.   Begin with a light meal and progress to your normal diet. Heavy or fried foods are harder to digest and may make you feel sick to your stomach (nauseated).   Avoid alcoholic beverages for 24 hours or as instructed.  MEDICATIONS  You may resume your normal medications unless your doctor tells you otherwise.  WHAT YOU CAN EXPECT TODAY  Some feelings of bloating in the abdomen.   Passage of more gas than usual.   Spotting of blood in your stool or on the toilet paper.  IF YOU HAD POLYPS REMOVED DURING THE COLONOSCOPY:  No aspirin products for 7 days or as instructed.   No alcohol for 7 days or as instructed.   Eat a soft diet for the next 24 hours.  FINDING OUT THE RESULTS OF YOUR TEST Not all test results are available during your visit. If your test results are not back during the visit, make an appointment  with your caregiver to find out the results. Do not assume everything is normal if you have not heard from your caregiver or the medical facility. It is important for you to follow up on all of your test results.  SEEK IMMEDIATE MEDICAL ATTENTION IF:  You have more than a spotting of blood in your stool.   Your belly is swollen (abdominal distention).   You are nauseated or vomiting.   You have a temperature over 101.   You have abdominal pain or discomfort that is severe or gets worse throughout the day.   Diverticulosis, colon polyp, hemorrhoid and constipation information provided  Pamphlet on hemorrhoid banding provided  Continue Linzess daily for constipation  Further recommendations to follow pending review of pathology report  Office visit with [place] in 6 weeks (AB) for possible hemorrhoid banding  At patient request, I called Bertrum Sol at [phone] and reviewed results      Diverticulosis  Diverticulosis is a condition that develops when small pouches (diverticula) form in the wall of the large intestine (colon). The colon is where water is absorbed and stool (feces) is formed. The pouches form when the inside layer of the colon pushes through weak spots in the outer layers of the colon. You may have a few pouches or many of them. The pouches usually do not cause problems unless they become inflamed or infected. When this happens, the condition is called diverticulitis. What are the causes? The  cause of this condition is not known. What increases the risk? The following factors may make you more likely to develop this condition:  Being older than age 13. Your risk for this condition increases with age. Diverticulosis is rare among people younger than age 16. By age 109, many people have it.  Eating a low-fiber diet.  Having frequent constipation.  Being overweight.  Not getting enough exercise.  Smoking.  Taking over-the-counter pain medicines, like aspirin  and ibuprofen.  Having a family history of diverticulosis. What are the signs or symptoms? In most people, there are no symptoms of this condition. If you do have symptoms, they may include:  Bloating.  Cramps in the abdomen.  Constipation or diarrhea.  Pain in the lower left side of the abdomen. How is this diagnosed? Because diverticulosis usually has no symptoms, it is most often diagnosed during an exam for other colon problems. The condition may be diagnosed by:  Using a flexible scope to examine the colon (colonoscopy).  Taking an X-ray of the colon after dye has been put into the colon (barium enema).  Having a CT scan. How is this treated? You may not need treatment for this condition. Your health care provider may recommend treatment to prevent problems. You may need treatment if you have symptoms or if you previously had diverticulitis. Treatment may include:  Eating a high-fiber diet.  Taking a fiber supplement.  Taking a live bacteria supplement (probiotic).  Taking medicine to relax your colon. Follow these instructions at home: Medicines  Take over-the-counter and prescription medicines only as told by your health care provider.  If told by your health care provider, take a fiber supplement or probiotic. Constipation prevention Your condition may cause constipation. To prevent or treat constipation, you may need to:  Drink enough fluid to keep your urine pale yellow.  Take over-the-counter or prescription medicines.  Eat foods that are high in fiber, such as beans, whole grains, and fresh fruits and vegetables.  Limit foods that are high in fat and processed sugars, such as fried or sweet foods.  General instructions  Try not to strain when you have a bowel movement.  Keep all follow-up visits as told by your health care provider. This is important. Contact a health care provider if you:  Have pain in your abdomen.  Have bloating.  Have  cramps.  Have not had a bowel movement in 3 days. Get help right away if:  Your pain gets worse.  Your bloating becomes very bad.  You have a fever or chills, and your symptoms suddenly get worse.  You vomit.  You have bowel movements that are bloody or black.  You have bleeding from your rectum. Summary  Diverticulosis is a condition that develops when small pouches (diverticula) form in the wall of the large intestine (colon).  You may have a few pouches or many of them.  This condition is most often diagnosed during an exam for other colon problems.  Treatment may include increasing the fiber in your diet, taking supplements, or taking medicines. This information is not intended to replace advice given to you by your health care provider. Make sure you discuss any questions you have with your health care provider. Document Revised: 07/29/2018 Document Reviewed: 07/29/2018 Elsevier Patient Education  [place].    Colon Polyps  Polyps are tissue growths inside the body. Polyps can grow in many places, including the large intestine (colon). A polyp may be a round bump  or a mushroom-shaped growth. You could have one polyp or several. Most colon polyps are noncancerous (benign). However, some colon polyps can become cancerous over time. Finding and removing the polyps early can help prevent this. What are the causes? The exact cause of colon polyps is not known. What increases the risk? You are more likely to develop this condition if you:  Have a family history of colon cancer or colon polyps.  Are older than 48 or older than 45 if you are African American.  Have inflammatory bowel disease, such as ulcerative colitis or Crohn's disease.  Have certain hereditary conditions, such as: ? Familial adenomatous polyposis. ? Lynch syndrome. ? Turcot syndrome. ? Peutz-Jeghers syndrome.  Are overweight.  Smoke cigarettes.  Do not get enough exercise.  Drink  too much alcohol.  Eat a diet that is high in fat and red meat and low in fiber.  Had childhood cancer that was treated with abdominal radiation. What are the signs or symptoms? Most polyps do not cause symptoms. If you have symptoms, they may include:  Blood coming from your rectum when having a bowel movement.  Blood in your stool. The stool may look dark red or black.  Abdominal pain.  A change in bowel habits, such as constipation or diarrhea. How is this diagnosed? This condition is diagnosed with a colonoscopy. This is a procedure in which a lighted, flexible scope is inserted into the anus and then passed into the colon to examine the area. Polyps are sometimes found when a colonoscopy is done as part of routine cancer screening tests. How is this treated? Treatment for this condition involves removing any polyps that are found. Most polyps can be removed during a colonoscopy. Those polyps will then be tested for cancer. Additional treatment may be needed depending on the results of testing. Follow these instructions at home: Lifestyle  Maintain a healthy weight, or lose weight if recommended by your health care provider.  Exercise every day or as told by your health care provider.  Do not use any products that contain nicotine or tobacco, such as cigarettes and e-cigarettes. If you need help quitting, ask your health care provider.  If you drink alcohol, limit how much you have: ? 0-1 drink a day for women. ? 0-2 drinks a day for men.  Be aware of how much alcohol is in your drink. In the [place] one drink equals one 12 oz bottle of beer (355 mL), one 5 oz glass of wine (148 mL), or one 1 oz shot of hard liquor (44 mL). Eating and drinking   Eat foods that are high in fiber, such as fruits, vegetables, and whole grains.  Eat foods that are high in calcium and vitamin D, such as milk, cheese, yogurt, eggs, liver, fish, and broccoli.  Limit foods that are high in fat,  such as fried foods and desserts.  Limit the amount of red meat and processed meat you eat, such as hot dogs, sausage, bacon, and lunch meats. General instructions  Keep all follow-up visits as told by your health care provider. This is important. ? This includes having regularly scheduled colonoscopies. ? Talk to your health care provider about when you need a colonoscopy. Contact a health care provider if:  You have new or worsening bleeding during a bowel movement.  You have new or increased blood in your stool.  You have a change in bowel habits.  You lose weight for no known reason. Summary  Polyps  are tissue growths inside the body. Polyps can grow in many places, including the colon.  Most colon polyps are noncancerous (benign), but some can become cancerous over time.  This condition is diagnosed with a colonoscopy.  Treatment for this condition involves removing any polyps that are found. Most polyps can be removed during a colonoscopy. This information is not intended to replace advice given to you by your health care provider. Make sure you discuss any questions you have with your health care provider. Document Revised: 04/16/2017 Document Reviewed: 04/16/2017 Elsevier Patient Education  [place].    Hemorrhoids Hemorrhoids are swollen veins in and around the rectum or anus. There are two types of hemorrhoids:  Internal hemorrhoids. These occur in the veins that are just inside the rectum. They may poke through to the outside and become irritated and painful.  External hemorrhoids. These occur in the veins that are outside the anus and can be felt as a painful swelling or hard lump near the anus. Most hemorrhoids do not cause serious problems, and they can be managed with home treatments such as diet and lifestyle changes. If home treatments do not help the symptoms, procedures can be done to shrink or remove the hemorrhoids. What are the causes? This  condition is caused by increased pressure in the anal area. This pressure may result from various things, including:  Constipation.  Straining to have a bowel movement.  Diarrhea.  Pregnancy.  Obesity.  Sitting for long periods of time.  Heavy lifting or other activity that causes you to strain.  Anal sex.  Riding a bike for a long period of time. What are the signs or symptoms? Symptoms of this condition include:  Pain.  Anal itching or irritation.  Rectal bleeding.  Leakage of stool (feces).  Anal swelling.  One or more lumps around the anus. How is this diagnosed? This condition can often be diagnosed through a visual exam. Other exams or tests may also be done, such as:  An exam that involves feeling the rectal area with a gloved hand (digital rectal exam).  An exam of the anal canal that is done using a small tube (anoscope).  A blood test, if you have lost a significant amount of blood.  A test to look inside the colon using a flexible tube with a camera on the end (sigmoidoscopy or colonoscopy). How is this treated? This condition can usually be treated at home. However, various procedures may be done if dietary changes, lifestyle changes, and other home treatments do not help your symptoms. These procedures can help make the hemorrhoids smaller or remove them completely. Some of these procedures involve surgery, and others do not. Common procedures include:  Rubber band ligation. Rubber bands are placed at the base of the hemorrhoids to cut off their blood supply.  Sclerotherapy. Medicine is injected into the hemorrhoids to shrink them.  Infrared coagulation. A type of light energy is used to get rid of the hemorrhoids.  Hemorrhoidectomy surgery. The hemorrhoids are surgically removed, and the veins that supply them are tied off.  Stapled hemorrhoidopexy surgery. The surgeon staples the base of the hemorrhoid to the rectal wall. Follow these instructions  at home: Eating and drinking   Eat foods that have a lot of fiber in them, such as whole grains, beans, nuts, fruits, and vegetables.  Ask your health care provider about taking products that have added fiber (fiber supplements).  Reduce the amount of fat in your diet. You  can do this by eating low-fat dairy products, eating less red meat, and avoiding processed foods.  Drink enough fluid to keep your urine pale yellow. Managing pain and swelling   Take warm sitz baths for 20 minutes, 3-4 times a day to ease pain and discomfort. You may do this in a bathtub or using a portable sitz bath that fits over the toilet.  If directed, apply ice to the affected area. Using ice packs between sitz baths may be helpful. ? Put ice in a plastic bag. ? Place a towel between your skin and the bag. ? Leave the ice on for 20 minutes, 2-3 times a day. General instructions  Take over-the-counter and prescription medicines only as told by your health care provider.  Use medicated creams or suppositories as told.  Get regular exercise. Ask your health care provider how much and what kind of exercise is best for you. In general, you should do moderate exercise for at least 30 minutes on most days of the week (150 minutes each week). This can include activities such as walking, biking, or yoga.  Go to the bathroom when you have the urge to have a bowel movement. Do not wait.  Avoid straining to have bowel movements.  Keep the anal area dry and clean. Use wet toilet paper or moist towelettes after a bowel movement.  Do not sit on the toilet for long periods of time. This increases blood pooling and pain.  Keep all follow-up visits as told by your health care provider. This is important. Contact a health care provider if you have:  Increasing pain and swelling that are not controlled by treatment or medicine.  Difficulty having a bowel movement, or you are unable to have a bowel movement.  Pain or  inflammation outside the area of the hemorrhoids. Get help right away if you have:  Uncontrolled bleeding from your rectum. Summary  Hemorrhoids are swollen veins in and around the rectum or anus.  Most hemorrhoids can be managed with home treatments such as diet and lifestyle changes.  Taking warm sitz baths can help ease pain and discomfort.  In severe cases, procedures or surgery can be done to shrink or remove the hemorrhoids. This information is not intended to replace advice given to you by your health care provider. Make sure you discuss any questions you have with your health care provider. Document Revised: 05/28/2018 Document Reviewed: 05/21/2017 Elsevier Patient Education  [place].     Constipation, Adult Constipation is when a person has fewer bowel movements in a week than normal, has difficulty having a bowel movement, or has stools that are dry, hard, or larger than normal. Constipation may be caused by an underlying condition. It may become worse with age if a person takes certain medicines and does not take in enough fluids. Follow these instructions at home: Eating and drinking   Eat foods that have a lot of fiber, such as fresh fruits and vegetables, whole grains, and beans.  Limit foods that are high in fat, low in fiber, or overly processed, such as french fries, hamburgers, cookies, candies, and soda.  Drink enough fluid to keep your urine clear or pale yellow. General instructions  Exercise regularly or as told by your health care provider.  Go to the restroom when you have the urge to go. Do not hold it in.  Take over-the-counter and prescription medicines only as told by your health care provider. These include any fiber supplements.  Practice pelvic floor retraining exercises, such as deep breathing while relaxing the lower abdomen and pelvic floor relaxation during bowel movements.  Watch your condition for any changes.  Keep all  follow-up visits as told by your health care provider. This is important. Contact a health care provider if:  You have pain that gets worse.  You have a fever.  You do not have a bowel movement after 4 days.  You vomit.  You are not hungry.  You lose weight.  You are bleeding from the anus.  You have thin, pencil-like stools. Get help right away if:  You have a fever and your symptoms suddenly get worse.  You leak stool or have blood in your stool.  Your abdomen is bloated.  You have severe pain in your abdomen.  You feel dizzy or you faint. This information is not intended to replace advice given to you by your health care provider. Make sure you discuss any questions you have with your health care provider. Document Revised: 12/12/2016 Document Reviewed: 06/20/2015 Elsevier Patient Education  2020 [place] After These instructions provide you with information about caring for yourself after your procedure. Your health care provider may also give you more specific instructions. Your treatment has been planned according to current medical practices, but problems sometimes occur. Call your health care provider if you have any problems or questions after your procedure. What can I expect after the procedure? After your procedure, you may:  Feel sleepy for several hours.  Feel clumsy and have poor balance for several hours.  Feel forgetful about what happened after the procedure.  Have poor judgment for several hours.  Feel nauseous or vomit.  Have a sore throat if you had a breathing tube during the procedure. Follow these instructions at home: For at least 24 hours after the procedure:      Have a responsible adult stay with you. It is important to have someone help care for you until you are awake and alert.  Rest as needed.  Do not: ? Participate in activities in which you could fall or become  injured. ? Drive. ? Use heavy machinery. ? Drink alcohol. ? Take sleeping pills or medicines that cause drowsiness. ? Make important decisions or sign legal documents. ? Take care of children on your own. Eating and drinking  Follow the diet that is recommended by your health care provider.  If you vomit, drink water, juice, or soup when you can drink without vomiting.  Make sure you have little or no nausea before eating solid foods. General instructions  Take over-the-counter and prescription medicines only as told by your health care provider.  If you have sleep apnea, surgery and certain medicines can increase your risk for breathing problems. Follow instructions from your health care provider about wearing your sleep device: ? Anytime you are sleeping, including during daytime naps. ? While taking prescription pain medicines, sleeping medicines, or medicines that make you drowsy.  If you smoke, do not smoke without supervision.  Keep all follow-up visits as told by your health care provider. This is important. Contact a health care provider if:  You keep feeling nauseous or you keep vomiting.  You feel light-headed.  You develop a rash.  You have a fever. Get help right away if:  You have trouble breathing. Summary  For several hours after your procedure, you may feel sleepy and have poor judgment.  Have a responsible  adult stay with you for at least 24 hours or until you are awake and alert. This information is not intended to replace advice given to you by your health care provider. Make sure you discuss any questions you have with your health care provider. Document Revised: 03/30/2017 Document Reviewed: 04/22/2015 Elsevier Patient Education  [place].

## 2019-04-11 LAB — SURGICAL PATHOLOGY

## 2019-04-12 ENCOUNTER — Telehealth: Payer: Self-pay | Admitting: Internal Medicine

## 2019-04-12 NOTE — Telephone Encounter (Signed)
Pt was advised to continue Linzess 290 mcg at her discharge procedure with [facility]. Pt needs RX refill sent to [facility].

## 2019-04-12 NOTE — Telephone Encounter (Signed)
Pt said that [facility] did her procedure on 04/07/2019 and was told she would get some Linzess. I asked if she got samples and she said no. She asked for a prescription to be called into [facility]. [phone]

## 2019-04-14 ENCOUNTER — Telehealth: Payer: Self-pay | Admitting: Internal Medicine

## 2019-04-14 ENCOUNTER — Encounter: Payer: Self-pay | Admitting: Internal Medicine

## 2019-04-14 NOTE — Telephone Encounter (Signed)
Previous message sent  

## 2019-04-14 NOTE — Telephone Encounter (Signed)
Pt said her pharmacy hasn't gotten her Linzess Rx yet. I told her the nurse was aware.

## 2019-04-26 MED ORDER — LINACLOTIDE 290 MCG PO CAPS: 290.0000 ug | ORAL_CAPSULE | Freq: Every day | ORAL | 11 refills | Status: DC

## 2019-04-26 NOTE — Telephone Encounter (Signed)
RX done. If there are any coverage issues, please address with [facility] who prescribed at time of last ov.

## 2019-04-26 NOTE — Telephone Encounter (Signed)
Pt called back to f/u on RX for Linzess 290 mcg.  [facility]

## 2019-04-26 NOTE — Addendum Note (Signed)
Addended by: Mahala Menghini on: 04/26/2019 12:57 PM   Modules accepted: Orders

## 2019-04-26 NOTE — Telephone Encounter (Signed)
Noted.  Called pt and made her aware that RX has been sent.

## 2019-05-20 ENCOUNTER — Other Ambulatory Visit: Payer: Self-pay

## 2019-05-20 ENCOUNTER — Encounter: Payer: Self-pay | Admitting: Gastroenterology

## 2019-05-20 ENCOUNTER — Ambulatory Visit (INDEPENDENT_AMBULATORY_CARE_PROVIDER_SITE_OTHER): Payer: Medicare Other | Admitting: Gastroenterology

## 2019-05-20 VITALS — BP 112/70 | HR 70 | Temp 97.1°F | Ht 62.0 in | Wt 152.2 lb

## 2019-05-20 DIAGNOSIS — K642 Third degree hemorrhoids: Secondary | ICD-10-CM | POA: Diagnosis not present

## 2019-05-20 NOTE — Patient Instructions (Signed)
Continue Linzess once daily.  I recommend adding Benefiber 2 teaspoons daily.  Try to limit toilet time to 2-3 minutes.  You can use the Anusol cream as needed per rectum.  I will see you in 2-3 weeks!  It was a pleasure to see you today. I want to create trusting relationships with patients to provide genuine, compassionate, and quality care. I value your feedback. If you receive a survey regarding your visit,  I greatly appreciate you taking time to fill this out.   Annitta Needs, PhD, ANP-BC [facility]

## 2019-05-20 NOTE — Progress Notes (Signed)
[facility] Banding Note:   59 year old female with symptomatic hemorrhoids presenting today to discuss banding. Colonoscopy March 2021 with Grade 3 hemorrhoids, scattered diverticula, three sessile polyps (3-7 mm in size, benign colonic mucosa and hyperplastic).  Constipation well managed with Linzess 290 mcg daily.   The patient presents with symptomatic grade 3 hemorrhoids, unresponsive to maximal medical therapy, requesting rubber band ligation of her hemorrhoidal disease. All risks, benefits, and alternative forms of therapy were described and informed consent was obtained.   The decision was made to band the left lateral internal hemorrhoid, and the [place] was used to perform band ligation without complication. Initially, there was discomfort after band deployment. I manipulated the band with instant relief. The patient was discharged home without pain or other issues. Dietary and behavioral recommendations were given, along with follow-up instructions. The patient will return in 2-3 weeks for followup and possible additional banding as required. Will focus on the right anterior at next visit.   No complications were encountered and the patient tolerated the procedure well.  Annitta Needs, PhD, ANP-BC [facility]

## 2019-06-09 ENCOUNTER — Other Ambulatory Visit: Payer: Self-pay

## 2019-06-09 ENCOUNTER — Ambulatory Visit (INDEPENDENT_AMBULATORY_CARE_PROVIDER_SITE_OTHER): Payer: Medicare Other | Admitting: Gastroenterology

## 2019-06-09 ENCOUNTER — Encounter: Payer: Self-pay | Admitting: Gastroenterology

## 2019-06-09 VITALS — BP 82/50 | HR 79 | Temp 97.3°F | Ht 62.0 in | Wt 147.4 lb

## 2019-06-09 DIAGNOSIS — I959 Hypotension, unspecified: Secondary | ICD-10-CM | POA: Insufficient documentation

## 2019-06-09 DIAGNOSIS — K642 Third degree hemorrhoids: Secondary | ICD-10-CM

## 2019-06-09 DIAGNOSIS — I952 Hypotension due to drugs: Secondary | ICD-10-CM | POA: Diagnosis not present

## 2019-06-09 MED ORDER — LINACLOTIDE 290 MCG PO CAPS: 290.0000 ug | ORAL_CAPSULE | Freq: Every day | ORAL | 30 refills | Status: DC

## 2019-06-09 NOTE — Patient Instructions (Signed)
Your BP was 86/55 and then repeated manually was 82/50. Don't take lisinopril today as we talked about. I called your PCP and they will see you today. Please go over to Dr. Denita Lung office.   I will see you back in a few weeks and we will pursue banding!  I refilled Linzess for you.    I enjoyed seeing you again today! As you know, I value our relationship and want to provide genuine, compassionate, and quality care. I welcome your feedback. If you receive a survey regarding your visit,  I greatly appreciate you taking time to fill this out. See you next time!  Annitta Needs, PhD, ANP-BC [facility]

## 2019-06-09 NOTE — Progress Notes (Signed)
Primary Care Physician:  Lucia Gaskins, MD  Primary GI: Dr. Gala Romney   Chief Complaint  Patient presents with  . Hemorrhoids    HPI:   Tanya Velazquez is a 59 y.o. female presenting today with a history of symptomatic hemorrhoids, s/p colonoscopy March 2021 with Grade 3 hemorrhoids, scattered diverticula, three sessile polyps (3-7 mm in size, benign colonic mucosa and hyperplastic).  Constipation well managed with Linzess 290 mcg daily. First banding May 2021 of left lateral.    Doing well since last banding. Not straining as much. No bleeding. No prolapsing tissue. No rectal itching. Lisinopril 20 mg daily. Has been taking chronically. No other new meds. BP 86/55. Has felt shaky for the past few days. No fever or chills. No abdominal pain. No melena or hematochezia. No chest pain or shortness of breath. Dr. Cindie Laroche is PCP. Doesn't feel like heart is racing. Sister passed away last 2022/04/23. Patient lived with her. Hasn't taken lisinopril today. Feeling dizzy last few days. Mainly with standing. Drinking gatorade-like drink to help with electrolytes. Urine is clear. Has appt upcoming June 16th with Dr. Cindie Laroche.   Jan 2021 BP 106/74.   Sept 2020 90/59. July 2020 100/65. May 2021 at last visit 112/70.   Past Medical History:  Diagnosis Date  . Anxiety   . B12 deficiency anemia   . Chronic constipation   . Chronic constipation   . Chronic low back pain    lumbar radiculopathy  . COPD (chronic obstructive pulmonary disease) ([place])   . Diabetes mellitus   . DJD (degenerative joint disease)   . GERD (gastroesophageal reflux disease) 04/13/07   EGD Dr Rourk->patulous EG junction, HH, antral erosions  . H/O hiatal hernia   . Hyperplastic colon polyp 06/27/2010  . Hypertension   . Migraines   . Osteoarthritis   . Raspy voice   . Smokers' cough ([place])   . SUI (stress urinary incontinence, female)     Past Surgical History:  Procedure Laterality Date  . APPENDECTOMY    .  BLADDER SUSPENSION  10/07/2011   Procedure: [facility] PROCEDURE;  Surgeon: Malka So, MD;  Location: [facility];  Service: Urology;  Laterality: N/A;  1 hour requested for this case   . CARDIAC CATHETERIZATION  08-09-2004 DR Einar Gip   NORMAL CORONARY ARTERIES/  NORMAL LVF  . CARDIAC CATHETERIZATION  2006   normal coronary arteries  . COLONOSCOPY  3/31/9   Dr Rourk->friable anal canal  . COLONOSCOPY  0 06/27/10   Dr. Gala Romney hyperplastic polyp, prominent external hemorrhoid plexus likely source of hematochezia, long tortuous colon. next tcs 06/2015.  Marland Kitchen COLONOSCOPY WITH PROPOFOL N/A 2019/04/23   Procedure: COLONOSCOPY WITH PROPOFOL;  Surgeon: Daneil Dolin, MD;  [facility];  Service: Endoscopy;  Laterality: N/A;  2:00pm - spoke w/pt, she knows to arrive at 11:30, she could not take the AM slot due to transportation  . CYSTOSCOPY  10/07/2011   Procedure: CYSTOSCOPY;  Surgeon: Malka So, MD;  Location: [facility];  Service: Urology;  Laterality: N/A;  . ESOPHAGEAL MANOMETRY  2013   Dr. Derrill Kay at [facility]. incomplete bolus clearance with some breaks in contractions but there was peristalsis  . ESOPHAGOGASTRODUODENOSCOPY  06/27/2010   Schatzki ring s/p dilation, small hiatal hernia, couple tiny antral erosions.  Marland Kitchen EXCISION VOLAR GANGLION LEFT WRIST  06-27-2005  . HEAD-UP TILT TABLE TEST  01-21-2007 &  11-04-2004  DR Rollene Fare   HX  CHRONIC RECURRENT SYNCOPAL. EPICODES--  NEGATIVE RESULT INCLUDING ISUPREL INFUSION  . HEMORRHOID SURGERY  09-13-2003  . LAPAROSCOPIC CHOLECYSTECTOMY  02-23-2007  . LAPAROTOMY W/ APPENDCTOMY AND LEFT SALPINGO-OOPHECTOMY  AGE 30'S  . pH/impedence  2013   Dr. Derrill Kay at [facility]. Increased reflux off PPI with excellent correlation between reflux events and regurgitation. Patient advised to hold PPI for study by Dr. Derrill Kay.   Marland Kitchen POLYPECTOMY  04/07/2019   Procedure: POLYPECTOMY;  Surgeon: Daneil Dolin, MD;  [facility];   Service: Endoscopy;;  . RIGHT SHOULDER ARTHROSCOPY/ DISTAL CLAVICLE RESECTION / [place] AND LABRUM TEAR/ BURSECTOMY  06-28-2010   IMPINGEMENT SYNDROME/ AC JOINT ARTHRITIS/ ROTATOR CUFF TENDINOPATHY  . SHOULDER ARTHROSCOPY  08-13-2010   LEFT SHOULDER IMPINGEMENT SYNDROME/ DJD OF AC JOINT  . STOMACH SURGERY  2013   antireflux surgery at [place]  age 32's    Current Outpatient Medications  Medication Sig Dispense Refill  . acetaminophen (TYLENOL) 325 MG tablet Take 2 tablets (650 mg total) by mouth every 6 (six) hours as needed for mild pain (or Fever >/= 101). 12 tablet 0  . Ascorbic Acid (VITAMIN C PO) Take 1 tablet by mouth daily.    . Cholecalciferol (VITAMIN D3) 50 MCG (2000 UT) TABS Take 2,000 Units by mouth daily.    Marland Kitchen ELDERBERRY PO Take 50 mg by mouth daily.    Marland Kitchen gabapentin (NEURONTIN) 300 MG capsule Take 900 mg by mouth daily.     . hydrocortisone (ANUSOL-HC) 2.5 % rectal cream Place 1 application rectally 2 (two) times daily. (Patient taking differently: Place 1 application rectally 2 (two) times daily as needed for hemorrhoids (discomfort). ) 30 g 1  . ipratropium-albuterol (DUONEB) 0.5-2.5 (3) MG/3ML SOLN Inhale 3 mLs into the lungs every 6 (six) hours as needed for wheezing or shortness of breath.    . linaclotide (LINZESS) 290 MCG CAPS capsule Take 1 capsule (290 mcg total) by mouth daily before breakfast. 90 capsule 30  . lisinopril (ZESTRIL) 20 MG tablet Take 20 mg by mouth daily.    . Multiple Vitamin (MULTIVITAMIN WITH MINERALS) TABS tablet Take 1 tablet by mouth daily.    Marland Kitchen oxyCODONE (OXY IR/ROXICODONE) 5 MG immediate release tablet Take 1 tablet (5 mg total) by mouth every 6 (six) hours as needed for severe pain. (Patient taking differently: Take 5 mg by mouth in the morning, at noon, in the evening, and at bedtime. )    . pravastatin (PRAVACHOL) 20 MG tablet Take 20 mg by mouth daily.     Marland Kitchen PROAIR HFA 108 (90 BASE) MCG/ACT inhaler  Inhale 1 puff into the lungs every 4 (four) hours as needed for wheezing or shortness of breath.     . QUEtiapine (SEROQUEL) 400 MG tablet Take 600 mg by mouth at bedtime.     . TRELEGY ELLIPTA 100-62.5-25 MCG/INH AEPB Inhale 1 puff into the lungs daily.    Marland Kitchen venlafaxine XR (EFFEXOR-XR) 75 MG 24 hr capsule Take 225 mg by mouth daily with breakfast.     . vitamin B-12 (CYANOCOBALAMIN) 500 MCG tablet Take 500 mcg by mouth daily.     No current facility-administered medications for this visit.    Allergies as of 06/09/2019 - Review Complete 06/09/2019  Allergen Reaction Noted  . Amicinonide-benzyl alcohol [cyclocort] Rash 06/20/2010  . Amoxicillin Rash 10/01/2011  . Doxycycline Rash 06/20/2010  . Paxil [paroxetine hcl] Rash 05/29/2016  . Penicillins Rash 03/10/2011  . Sulfa antibiotics Rash 04/10/2014  .  Trimethoprim Rash 06/20/2010    Family History  Problem Relation Age of Onset  . Colon cancer Mother 63  . Hypertension Mother   . Heart disease Mother 24       CABG  . Alzheimer's disease Father   . Hypertension Father   . Healthy Daughter   . Heart failure Sister   . Heart Problems Sister        Cardiac stent    Social History   Socioeconomic History  . Marital status: Divorced    Spouse name: Not on file  . Number of children: 3  . Years of education: Not on file  . Highest education level: Not on file  Occupational History  . Occupation: disabled    Fish farm manager: UNEMPLOYED  Tobacco Use  . Smoking status: Current Some Day Smoker    Packs/day: 1.00    Years: 35.00    Pack years: 35.00    Types: Cigarettes  . Smokeless tobacco: Never Used  Substance and Sexual Activity  . Alcohol use: No    Alcohol/week: 0.0 standard drinks  . Drug use: No  . Sexual activity: Yes    Birth control/protection: Surgical  Other Topics Concern  . Not on file  Social History Narrative   Lives alone in a one story home.  Has 2 children.     On disability since 2007 for syncope.  Did  work in Charity fundraiser.     Education: high school.   Social Determinants of Health   Financial Resource Strain:   . Difficulty of Paying Living Expenses:   Food Insecurity:   . Worried About Charity fundraiser in the Last Year:   . Arboriculturist in the Last Year:   Transportation Velazquez:   . Film/video editor (Medical):   Marland Kitchen Lack of Transportation (Non-Medical):   Physical Activity:   . Days of Exercise per Week:   . Minutes of Exercise per Session:   Stress:   . Feeling of Stress :   Social Connections:   . Frequency of Communication with Friends and Family:   . Frequency of Social Gatherings with Friends and Family:   . Attends Religious Services:   . Active Member of Clubs or Organizations:   . Attends Archivist Meetings:   Marland Kitchen Marital Status:     Review of Systems: As mentioned in HPI  Physical Exam: BP (!) 82/50   Pulse 79   Temp (!) 97.3 F (36.3 C) (Oral)   Ht 5\' 2"  (1.575 m)   Wt 147 lb 6.4 oz (66.9 kg)   BMI 26.96 kg/m  General:   Alert and oriented. No distress noted. Pleasant and cooperative.  Head:  Normocephalic and atraumatic. Eyes:  Conjuctiva clear without scleral icterus. Mouth:  Mask in place Cardiac: S1 S2 present, regular Abdomen:  Not completed Msk:  Symmetrical without gross deformities. Normal posture. Extremities:  Without edema. Neurologic:  Alert and  oriented x4 Psych:  Alert and cooperative. Normal mood and affect.  ASSESSMENT: ANNABELLA HULLER is a 59 y.o. female presenting today with history of symptomatic hemorrhoids, constipation, s/p first banding a few weeks ago. Today, she is hypotensive but without any chest pain, SOB, dizziness, GI bleeding, signs of dehydration. Pulse regular with auscultation. We repeated BP again and remained low. She has not taken Lisinopril today. She does note dizziness intermittently, specifically with standing over the past few days. Notably, she is grieving as her sister passed one week ago  unexpectedly.   I do note that at times she has lower SBP in the low 100s, fluctuating. I have called PCP's office, and they will see her today. We will hold off on banding at regroup in a few weeks. I have refilled Linzess for her.    PLAN:   Continue Linzess 290 mcg daily. Refills provided  Go straight to PCP's office for further assessment  Return in a few weeks for potential banding  Tanya Needs, PhD, [facility]

## 2019-06-10 ENCOUNTER — Other Ambulatory Visit: Payer: Self-pay

## 2019-06-10 ENCOUNTER — Emergency Department (HOSPITAL_COMMUNITY): Payer: Medicare Other

## 2019-06-10 ENCOUNTER — Inpatient Hospital Stay (HOSPITAL_COMMUNITY)
Admission: EM | Admit: 2019-06-10 | Discharge: 2019-06-12 | DRG: 683 | Disposition: A | Discharge status: Home / Self Care | Payer: Medicare Other | Attending: Internal Medicine | Admitting: Internal Medicine

## 2019-06-10 ENCOUNTER — Encounter (HOSPITAL_COMMUNITY): Payer: Self-pay | Admitting: Emergency Medicine

## 2019-06-10 DIAGNOSIS — E860 Dehydration: Secondary | ICD-10-CM | POA: Diagnosis present

## 2019-06-10 DIAGNOSIS — G8929 Other chronic pain: Secondary | ICD-10-CM | POA: Diagnosis present

## 2019-06-10 DIAGNOSIS — Z8249 Family history of ischemic heart disease and other diseases of the circulatory system: Secondary | ICD-10-CM

## 2019-06-10 DIAGNOSIS — N179 Acute kidney failure, unspecified: Secondary | ICD-10-CM

## 2019-06-10 DIAGNOSIS — M5416 Radiculopathy, lumbar region: Secondary | ICD-10-CM | POA: Diagnosis present

## 2019-06-10 DIAGNOSIS — I129 Hypertensive chronic kidney disease with stage 1 through stage 4 chronic kidney disease, or unspecified chronic kidney disease: Secondary | ICD-10-CM | POA: Diagnosis present

## 2019-06-10 DIAGNOSIS — S92301D Fracture of unspecified metatarsal bone(s), right foot, subsequent encounter for fracture with routine healing: Secondary | ICD-10-CM | POA: Diagnosis not present

## 2019-06-10 DIAGNOSIS — Z888 Allergy status to other drugs, medicaments and biological substances status: Secondary | ICD-10-CM

## 2019-06-10 DIAGNOSIS — R55 Syncope and collapse: Secondary | ICD-10-CM | POA: Diagnosis present

## 2019-06-10 DIAGNOSIS — E785 Hyperlipidemia, unspecified: Secondary | ICD-10-CM | POA: Diagnosis present

## 2019-06-10 DIAGNOSIS — K5909 Other constipation: Secondary | ICD-10-CM | POA: Diagnosis present

## 2019-06-10 DIAGNOSIS — N390 Urinary tract infection, site not specified: Secondary | ICD-10-CM | POA: Diagnosis present

## 2019-06-10 DIAGNOSIS — F419 Anxiety disorder, unspecified: Secondary | ICD-10-CM | POA: Diagnosis present

## 2019-06-10 DIAGNOSIS — D519 Vitamin B12 deficiency anemia, unspecified: Secondary | ICD-10-CM | POA: Diagnosis present

## 2019-06-10 DIAGNOSIS — G4700 Insomnia, unspecified: Secondary | ICD-10-CM | POA: Diagnosis present

## 2019-06-10 DIAGNOSIS — F329 Major depressive disorder, single episode, unspecified: Secondary | ICD-10-CM | POA: Diagnosis present

## 2019-06-10 DIAGNOSIS — Z880 Allergy status to penicillin: Secondary | ICD-10-CM

## 2019-06-10 DIAGNOSIS — W19XXXA Unspecified fall, initial encounter: Secondary | ICD-10-CM | POA: Diagnosis present

## 2019-06-10 DIAGNOSIS — J449 Chronic obstructive pulmonary disease, unspecified: Secondary | ICD-10-CM | POA: Diagnosis present

## 2019-06-10 DIAGNOSIS — Z882 Allergy status to sulfonamides status: Secondary | ICD-10-CM

## 2019-06-10 DIAGNOSIS — K219 Gastro-esophageal reflux disease without esophagitis: Secondary | ICD-10-CM | POA: Diagnosis present

## 2019-06-10 DIAGNOSIS — Z20822 Contact with and (suspected) exposure to covid-19: Secondary | ICD-10-CM | POA: Diagnosis present

## 2019-06-10 DIAGNOSIS — N1831 Chronic kidney disease, stage 3a: Secondary | ICD-10-CM | POA: Diagnosis present

## 2019-06-10 DIAGNOSIS — M545 Low back pain: Secondary | ICD-10-CM | POA: Diagnosis present

## 2019-06-10 DIAGNOSIS — S92309A Fracture of unspecified metatarsal bone(s), unspecified foot, initial encounter for closed fracture: Secondary | ICD-10-CM | POA: Diagnosis present

## 2019-06-10 DIAGNOSIS — Z7951 Long term (current) use of inhaled steroids: Secondary | ICD-10-CM

## 2019-06-10 DIAGNOSIS — N1832 Chronic kidney disease, stage 3b: Secondary | ICD-10-CM | POA: Diagnosis not present

## 2019-06-10 DIAGNOSIS — Z79899 Other long term (current) drug therapy: Secondary | ICD-10-CM

## 2019-06-10 DIAGNOSIS — E1122 Type 2 diabetes mellitus with diabetic chronic kidney disease: Secondary | ICD-10-CM | POA: Diagnosis present

## 2019-06-10 DIAGNOSIS — Z8719 Personal history of other diseases of the digestive system: Secondary | ICD-10-CM | POA: Diagnosis not present

## 2019-06-10 DIAGNOSIS — I10 Essential (primary) hypertension: Secondary | ICD-10-CM | POA: Diagnosis not present

## 2019-06-10 DIAGNOSIS — N183 Chronic kidney disease, stage 3 unspecified: Secondary | ICD-10-CM | POA: Diagnosis present

## 2019-06-10 DIAGNOSIS — N393 Stress incontinence (female) (male): Secondary | ICD-10-CM | POA: Diagnosis present

## 2019-06-10 DIAGNOSIS — F4320 Adjustment disorder, unspecified: Secondary | ICD-10-CM | POA: Diagnosis present

## 2019-06-10 DIAGNOSIS — F17210 Nicotine dependence, cigarettes, uncomplicated: Secondary | ICD-10-CM | POA: Diagnosis present

## 2019-06-10 DIAGNOSIS — M1990 Unspecified osteoarthritis, unspecified site: Secondary | ICD-10-CM | POA: Diagnosis present

## 2019-06-10 DIAGNOSIS — S92302A Fracture of unspecified metatarsal bone(s), left foot, initial encounter for closed fracture: Secondary | ICD-10-CM | POA: Diagnosis present

## 2019-06-10 DIAGNOSIS — F418 Other specified anxiety disorders: Secondary | ICD-10-CM | POA: Diagnosis present

## 2019-06-10 DIAGNOSIS — Z881 Allergy status to other antibiotic agents status: Secondary | ICD-10-CM

## 2019-06-10 LAB — URINALYSIS, ROUTINE W REFLEX MICROSCOPIC
Bilirubin Urine: NEGATIVE
Glucose, UA: NEGATIVE mg/dL
Hgb urine dipstick: NEGATIVE
Ketones, ur: NEGATIVE mg/dL
Nitrite: NEGATIVE
Protein, ur: 30 mg/dL — AB
Specific Gravity, Urine: 1.018 (ref 1.005–1.030)
WBC, UA: >50 WBC/hpf — ABNORMAL HIGH (ref 0–5)
pH: 5 (ref 5.0–8.0)

## 2019-06-10 LAB — COMPREHENSIVE METABOLIC PANEL
ALT: 31 U/L (ref 0–44)
AST: 32 U/L (ref 15–41)
Albumin: 3.9 g/dL (ref 3.5–5.0)
Alkaline Phosphatase: 79 U/L (ref 38–126)
Anion gap: 11 (ref 5–15)
BUN: 72 mg/dL — ABNORMAL HIGH (ref 6–20)
CO2: 24 mmol/L (ref 22–32)
Calcium: 9 mg/dL (ref 8.9–10.3)
Chloride: 104 mmol/L (ref 98–111)
Creatinine, Ser: 6.46 mg/dL — ABNORMAL HIGH (ref 0.44–1.00)
GFR calc Af Amer: 7 mL/min — ABNORMAL LOW (ref >60)
GFR calc non Af Amer: 6 mL/min — ABNORMAL LOW (ref >60)
Glucose, Bld: 101 mg/dL — ABNORMAL HIGH (ref 70–99)
Potassium: 4.2 mmol/L (ref 3.5–5.1)
Sodium: 139 mmol/L (ref 135–145)
Total Bilirubin: 0.7 mg/dL (ref 0.3–1.2)
Total Protein: 6.8 g/dL (ref 6.5–8.1)

## 2019-06-10 LAB — CBC WITH DIFFERENTIAL/PLATELET
Abs Immature Granulocytes: 0.02 10*3/uL (ref 0.00–0.07)
Basophils Absolute: 0 10*3/uL (ref 0.0–0.1)
Basophils Relative: 0 %
Eosinophils Absolute: 0.1 10*3/uL (ref 0.0–0.5)
Eosinophils Relative: 1 %
HCT: 32 % — ABNORMAL LOW (ref 36.0–46.0)
Hemoglobin: 10.4 g/dL — ABNORMAL LOW (ref 12.0–15.0)
Immature Granulocytes: 0 %
Lymphocytes Relative: 19 %
Lymphs Abs: 1.3 10*3/uL (ref 0.7–4.0)
MCH: 32 pg (ref 26.0–34.0)
MCHC: 32.5 g/dL (ref 30.0–36.0)
MCV: 98.5 fL (ref 80.0–100.0)
Monocytes Absolute: 0.4 10*3/uL (ref 0.1–1.0)
Monocytes Relative: 5 %
Neutro Abs: 5.3 10*3/uL (ref 1.7–7.7)
Neutrophils Relative %: 75 %
Platelets: 182 10*3/uL (ref 150–400)
RBC: 3.25 MIL/uL — ABNORMAL LOW (ref 3.87–5.11)
RDW: 13.3 % (ref 11.5–15.5)
WBC: 7.1 10*3/uL (ref 4.0–10.5)
nRBC: 0 % (ref 0.0–0.2)

## 2019-06-10 LAB — RAPID URINE DRUG SCREEN, HOSP PERFORMED
Amphetamines: NOT DETECTED
Barbiturates: NOT DETECTED
Benzodiazepines: POSITIVE — AB
Cocaine: NOT DETECTED
Opiates: NOT DETECTED
Tetrahydrocannabinol: NOT DETECTED

## 2019-06-10 LAB — TROPONIN I (HIGH SENSITIVITY)
Troponin I (High Sensitivity): 2 ng/L (ref <18)
Troponin I (High Sensitivity): 2 ng/L (ref <18)

## 2019-06-10 LAB — SARS CORONAVIRUS 2 BY RT PCR (HOSPITAL ORDER, PERFORMED IN ~~LOC~~ HOSPITAL LAB): SARS Coronavirus 2: NEGATIVE

## 2019-06-10 LAB — ETHANOL: Alcohol, Ethyl (B): <10 mg/dL (ref <10)

## 2019-06-10 LAB — CK: Total CK: 444 U/L — ABNORMAL HIGH (ref 38–234)

## 2019-06-10 MED ORDER — VITAMIN B-12 100 MCG PO TABS
500.0000 ug | ORAL_TABLET | Freq: Every day | ORAL | Status: DC
  Administered 2019-06-11 – 2019-06-12 (×2): 500 ug via ORAL
  Filled 2019-06-10 (×3): qty 1
  Filled 2019-06-10: qty 5
  Filled 2019-06-10 (×2): qty 1

## 2019-06-10 MED ORDER — SODIUM CHLORIDE 0.9 % IV BOLUS
1000.0000 mL | Freq: Once | INTRAVENOUS | Status: AC
  Administered 2019-06-10: 1000 mL via INTRAVENOUS

## 2019-06-10 MED ORDER — LACTATED RINGERS IV BOLUS
1000.0000 mL | Freq: Once | INTRAVENOUS | Status: AC
  Administered 2019-06-10: 1000 mL via INTRAVENOUS

## 2019-06-10 MED ORDER — LACTATED RINGERS IV SOLN: INTRAVENOUS | Status: DC

## 2019-06-10 MED ORDER — ACETAMINOPHEN 325 MG PO TABS
650.0000 mg | ORAL_TABLET | Freq: Four times a day (QID) | ORAL | Status: DC | PRN
  Administered 2019-06-11: 650 mg via ORAL
  Filled 2019-06-10: qty 2

## 2019-06-10 MED ORDER — ALBUTEROL SULFATE HFA 108 (90 BASE) MCG/ACT IN AERS: 1.0000 | INHALATION_SPRAY | RESPIRATORY_TRACT | Status: DC | PRN

## 2019-06-10 MED ORDER — SODIUM CHLORIDE 0.9 % IV SOLN
1.0000 g | INTRAVENOUS | Status: DC
  Administered 2019-06-10 – 2019-06-11 (×2): 1 g via INTRAVENOUS
  Filled 2019-06-10 (×2): qty 10

## 2019-06-10 MED ORDER — VENLAFAXINE HCL ER 75 MG PO CP24
225.0000 mg | ORAL_CAPSULE | Freq: Every day | ORAL | Status: DC
  Administered 2019-06-11 – 2019-06-12 (×2): 225 mg via ORAL
  Filled 2019-06-10 (×2): qty 3

## 2019-06-10 MED ORDER — BUPROPION HCL ER (SR) 150 MG PO TB12
150.0000 mg | ORAL_TABLET | Freq: Every day | ORAL | Status: DC
  Administered 2019-06-10 – 2019-06-12 (×3): 150 mg via ORAL
  Filled 2019-06-10 (×7): qty 1

## 2019-06-10 MED ORDER — FLUTICASONE FUROATE-VILANTEROL 100-25 MCG/INH IN AEPB
1.0000 | INHALATION_SPRAY | Freq: Every day | RESPIRATORY_TRACT | Status: DC
  Administered 2019-06-10 – 2019-06-12 (×3): 1 via RESPIRATORY_TRACT
  Filled 2019-06-10: qty 28

## 2019-06-10 MED ORDER — ONDANSETRON HCL 4 MG/2ML IJ SOLN
4.0000 mg | Freq: Once | INTRAMUSCULAR | Status: AC
  Administered 2019-06-10: 4 mg via INTRAVENOUS
  Filled 2019-06-10: qty 2

## 2019-06-10 MED ORDER — OXYCODONE HCL 5 MG PO TABS
5.0000 mg | ORAL_TABLET | Freq: Once | ORAL | Status: AC
  Administered 2019-06-10: 5 mg via ORAL
  Filled 2019-06-10: qty 1

## 2019-06-10 MED ORDER — UMECLIDINIUM BROMIDE 62.5 MCG/INH IN AEPB
1.0000 | INHALATION_SPRAY | Freq: Every day | RESPIRATORY_TRACT | Status: DC
  Administered 2019-06-10 – 2019-06-12 (×3): 1 via RESPIRATORY_TRACT
  Filled 2019-06-10: qty 7

## 2019-06-10 MED ORDER — IPRATROPIUM-ALBUTEROL 0.5-2.5 (3) MG/3ML IN SOLN
3.0000 mL | Freq: Four times a day (QID) | RESPIRATORY_TRACT | Status: DC | PRN
  Administered 2019-06-11: 3 mL via RESPIRATORY_TRACT
  Filled 2019-06-10: qty 3

## 2019-06-10 MED ORDER — OXYCODONE HCL 5 MG PO TABS
5.0000 mg | ORAL_TABLET | Freq: Four times a day (QID) | ORAL | Status: DC | PRN
  Administered 2019-06-10 – 2019-06-12 (×6): 5 mg via ORAL
  Filled 2019-06-10 (×6): qty 1

## 2019-06-10 MED ORDER — ALBUTEROL SULFATE HFA 108 (90 BASE) MCG/ACT IN AERS
2.0000 | INHALATION_SPRAY | Freq: Once | RESPIRATORY_TRACT | Status: AC
  Administered 2019-06-10: 2 via RESPIRATORY_TRACT
  Filled 2019-06-10: qty 6.7

## 2019-06-10 MED ORDER — FLUTICASONE-UMECLIDIN-VILANT 100-62.5-25 MCG/INH IN AEPB: 1.0000 | INHALATION_SPRAY | Freq: Every day | RESPIRATORY_TRACT | Status: DC

## 2019-06-10 MED ORDER — ZOLPIDEM TARTRATE 5 MG PO TABS: 5.0000 mg | ORAL_TABLET | Freq: Every evening | ORAL | Status: DC | PRN

## 2019-06-10 MED ORDER — HEPARIN SODIUM (PORCINE) 5000 UNIT/ML IJ SOLN
5000.0000 [IU] | Freq: Three times a day (TID) | INTRAMUSCULAR | Status: DC
  Administered 2019-06-10 – 2019-06-12 (×5): 5000 [IU] via SUBCUTANEOUS
  Filled 2019-06-10 (×5): qty 1

## 2019-06-10 NOTE — ED Notes (Signed)
Pt assisted to bathroom via wheelchair and back to bed; pt c/o left ankle pain while in bathroom, Shawn Joy notified of pain and swelling to the extremity

## 2019-06-10 NOTE — ED Provider Notes (Signed)
Shared service with APP.  I have personally seen and examined the patient, providing direct face to face care.  Physical exam findings and plan include chest pain and hypotension.  Broad differential.  Decreased appetite and 2 days minimal fluids. Labs show significant acute renal failure, pt improved with IV fluid bolus. Bedside US performed, no signs of tamponade.  Procedures   No diagnosis found.     Elnora Morrison, MD 06/15/19 1610

## 2019-06-10 NOTE — ED Triage Notes (Signed)
Pt c/o of cp, ems arrived. Hypotensive 75/45.

## 2019-06-10 NOTE — H&P (Signed)
TRH H&P   Patient Demographics:    Tanya Velazquez, is a 59 y.o. female  MRN: [id number]   DOB - [birth year]  Admit Date - 06/10/2019  Outpatient Primary MD for the patient is Lucia Gaskins, MD  Referring MD/NP/PA: Dr Tomi Bamberger  Patient coming from: Home  Chief Complaint  Patient presents with  . Chest Pain      HPI:    Tanya Velazquez  is a 59 y.o. female, with past medical history of anxiety, B12 deficiency anemia, COPD, diabetes mellitus, GERD, hypertension, presenting to emergency room with syncope, patient reports she has not been feeling well for last few days, reports she has lightheadedness, dizziness, and left shoulder burning sensation, left flank and left hip burning sensation as well, patient reports her sister passed away 10 days ago, she used to live with her, reports she has been feeling sad, with overall poor oral intake, and poor fluid intake as well, even herself mentioned she knows she does not drink enough fluids, and mainly she drinks a lot of [facility], as well she does report 2 episodes of vomiting, nonbilious, nonbloody, over last 24 hours, and reports poor appetite and nausea. -Patient had syncopal episode today, when she was trying to get into her car, she had total loss of consciousness, but no head trauma. - in ED work-up was significant for hypotension, clinically dry and dehydrated, and creatinine elevated at 6.46 from baseline of 1.4, EKG was nonacute, patient did respond well to IV fluids, as well patient had left ankle pain status post her syncope, x-ray significant for Small avulsion fracture fragments between the lateral malleolus and talus of  left foot.[facility]  hospitalist requested to admit.     Review of systems:    In addition to the HPI above,  No Fever-chills, ports syncope, generalized weakness and fatigue No Headache, No changes with Vision  or hearing, No problems swallowing food or Liquids, No Chest pain, Cough or Shortness of Breath, No Abdominal pain, No Nausea or Vommitting, Bowel movements are regular, No Blood in stool or Urine, No dysuria, No new skin rashes or bruises, No new joints pains-aches,  No new weakness, tingling, numbness in any extremity, No recent weight gain or loss, No polyuria, polydypsia or polyphagia, No significant Mental Stressors.  A full 10 point Review of Systems was done, except as stated above, all other Review of Systems were negative.   With Past History of the following :    Past Medical History:  Diagnosis Date  . Anxiety   . B12 deficiency anemia   . Chronic constipation   . Chronic constipation   . Chronic low back pain    lumbar radiculopathy  . COPD (chronic obstructive pulmonary disease) ([place])   . Diabetes mellitus   . DJD (degenerative joint disease)   . GERD (gastroesophageal reflux disease) 04/13/07   EGD Dr Rourk->patulous EG junction,  HH, antral erosions  . H/O hiatal hernia   . Hyperplastic colon polyp 06/27/2010  . Hypertension   . Migraines   . Osteoarthritis   . Raspy voice   . Smokers' cough ([place])   . SUI (stress urinary incontinence, female)       Past Surgical History:  Procedure Laterality Date  . APPENDECTOMY    . BLADDER SUSPENSION  10/07/2011   Procedure: [facility] PROCEDURE;  Surgeon: Malka So, MD;  Location[facility];  Service: Urology;  Laterality: N/A;  1 hour requested for this case   . CARDIAC CATHETERIZATION  08-09-2004 DR Einar Gip   NORMAL CORONARY ARTERIES/  NORMAL LVF  . CARDIAC CATHETERIZATION  2006   normal coronary arteries  . COLONOSCOPY  3/31/9   Dr Rourk->friable anal canal  . COLONOSCOPY  0 06/27/10   Dr. Gala Romney hyperplastic polyp, prominent external hemorrhoid plexus likely source of hematochezia, long tortuous colon. next tcs 06/2015.  Marland Kitchen COLONOSCOPY WITH PROPOFOL N/A 04/07/2019   Procedure: COLONOSCOPY WITH  PROPOFOL;  Surgeon: Daneil Dolin, MD;  [facility];  Service: Endoscopy;  Laterality: N/A;  2:00pm - spoke w/pt, she knows to arrive at 11:30, she could not take the AM slot due to transportation  . CYSTOSCOPY  10/07/2011   Procedure: CYSTOSCOPY;  Surgeon: Malka So, MD;  Location: [facility];  Service: Urology;  Laterality: N/A;  . ESOPHAGEAL MANOMETRY  2013   Dr. Derrill Kay at [facility]. incomplete bolus clearance with some breaks in contractions but there was peristalsis  . ESOPHAGOGASTRODUODENOSCOPY  06/27/2010   Schatzki ring s/p dilation, small hiatal hernia, couple tiny antral erosions.  Marland Kitchen EXCISION VOLAR GANGLION LEFT WRIST  06-27-2005  . HEAD-UP TILT TABLE TEST  01-21-2007 &  11-04-2004  DR Rollene Fare   HX CHRONIC RECURRENT SYNCOPAL. EPICODES--  NEGATIVE RESULT INCLUDING ISUPREL INFUSION  . HEMORRHOID SURGERY  09-13-2003  . LAPAROSCOPIC CHOLECYSTECTOMY  02-23-2007  . LAPAROTOMY W/ APPENDCTOMY AND LEFT SALPINGO-OOPHECTOMY  AGE 62'S  . pH/impedence  2013   Dr. Derrill Kay at [place]. Increased reflux off PPI with excellent correlation between reflux events and regurgitation. Patient advised to hold PPI for study by Dr. Derrill Kay.   Marland Kitchen POLYPECTOMY  04/07/2019   Procedure: POLYPECTOMY;  Surgeon: Daneil Dolin, MD;  [facility];  Service: Endoscopy;;  . RIGHT SHOULDER ARTHROSCOPY/ DISTAL CLAVICLE RESECTION / [place] AND LABRUM TEAR/ BURSECTOMY  06-28-2010   IMPINGEMENT SYNDROME/ AC JOINT ARTHRITIS/ ROTATOR CUFF TENDINOPATHY  . SHOULDER ARTHROSCOPY  08-13-2010   LEFT SHOULDER IMPINGEMENT SYNDROME/ DJD OF AC JOINT  . STOMACH SURGERY  2013   antireflux surgery at [place]  age 28's      Social History:     Social History   Tobacco Use  . Smoking status: Current Some Day Smoker    Packs/day: 1.00    Years: 35.00    Pack years: 35.00    Types: Cigarettes  . Smokeless tobacco: Never Used  Substance Use Topics  .  Alcohol use: No    Alcohol/week: 0.0 standard drinks      Family History :     Family History  Problem Relation Age of Onset  . Colon cancer Mother 41  . Hypertension Mother   . Heart disease Mother 8       CABG  . Alzheimer's disease Father   . Hypertension Father   . Healthy Daughter   . Heart failure Sister   .  Heart Problems Sister        Cardiac stent      Home Medications:   Prior to Admission medications   Medication Sig Start Date End Date Taking? Authorizing Provider  acetaminophen (TYLENOL) 325 MG tablet Take 2 tablets (650 mg total) by mouth every 6 (six) hours as needed for mild pain (or Fever >/= 101). 10/08/18   Roxan Hockey, MD  Ascorbic Acid (VITAMIN C PO) Take 1 tablet by mouth daily.    [provider]  buPROPion (WELLBUTRIN SR) 150 MG 12 hr tablet Take 1 tablet by mouth daily. 05/27/19   [provider]  Cholecalciferol (VITAMIN D3) 50 MCG (2000 UT) TABS Take 2,000 Units by mouth daily.    [provider]  ELDERBERRY PO Take 50 mg by mouth daily.    [provider]  gabapentin (NEURONTIN) 300 MG capsule Take 900 mg by mouth daily.     [provider]  hydrocortisone (ANUSOL-HC) 2.5 % rectal cream Place 1 application rectally 2 (two) times daily. Patient taking differently: Place 1 application rectally 2 (two) times daily as needed for hemorrhoids (discomfort).  02/04/19   Erenest Rasher, PA-C  ipratropium-albuterol (DUONEB) 0.5-2.5 (3) MG/3ML SOLN Inhale 3 mLs into the lungs every 6 (six) hours as needed for wheezing or shortness of breath. 03/10/19   [provider]  linaclotide Rolan Lipa) 290 MCG CAPS capsule Take 1 capsule (290 mcg total) by mouth daily before breakfast. 06/09/19   Annitta Needs, NP  lisinopril (ZESTRIL) 20 MG tablet Take 20 mg by mouth daily.    [provider]  Multiple Vitamin (MULTIVITAMIN WITH MINERALS) TABS tablet Take 1 tablet by mouth daily.    [provider]   oxyCODONE (OXY IR/ROXICODONE) 5 MG immediate release tablet Take 1 tablet (5 mg total) by mouth every 6 (six) hours as needed for severe pain. Patient taking differently: Take 5 mg by mouth in the morning, at noon, in the evening, and at bedtime.  08/10/18   Barton Dubois, MD  pravastatin (PRAVACHOL) 20 MG tablet Take 20 mg by mouth daily.  12/17/12   [provider]  PROAIR HFA 108 (90 BASE) MCG/ACT inhaler Inhale 1 puff into the lungs every 4 (four) hours as needed for wheezing or shortness of breath.  05/13/10   [provider]  QUEtiapine (SEROQUEL) 400 MG tablet Take 600 mg by mouth at bedtime.  02/25/18   [provider]  TRELEGY ELLIPTA 100-62.5-25 MCG/INH AEPB Inhale 1 puff into the lungs daily. 03/11/18   [provider]  venlafaxine XR (EFFEXOR-XR) 75 MG 24 hr capsule Take 225 mg by mouth daily with breakfast.     [provider]  vitamin B-12 (CYANOCOBALAMIN) 500 MCG tablet Take 500 mcg by mouth daily.    [provider]     Allergies:     Allergies  Allergen Reactions  . Amicinonide-Benzyl Alcohol [Cyclocort] Rash  . Amoxicillin Rash    Has patient had a PCN reaction causing immediate rash, facial/tongue/throat swelling, SOB or lightheadedness with hypotension: Yes Has patient had a PCN reaction causing severe rash involving mucus membranes or skin necrosis: Yes Has patient had a PCN reaction that required hospitalization: No Has patient had a PCN reaction occurring within the last 10 years: No If all of the above answers are "NO", then may proceed with Cephalosporin use.    Marland Kitchen Doxycycline Rash  . Paxil [Paroxetine Hcl] Rash  . Penicillins Rash    Has patient  had a PCN reaction causing immediate rash, facial/tongue/throat swelling, SOB or lightheadedness with hypotension: Yes Has patient had a PCN reaction causing severe rash involving mucus membranes or skin necrosis: Yes Has patient had a PCN reaction that required  hospitalization: No Has patient had a PCN reaction occurring within the last 10 years: No If all of the above answers are "NO", then may proceed with Cephalosporin use.   . Sulfa Antibiotics Rash  . Trimethoprim Rash     Physical Exam:   Vitals  Blood pressure 101/60, pulse 69, temperature 97.7 F (36.5 C), temperature source Oral, resp. rate 18, height 5\' 2"  (1.575 m), weight 66.7 kg, SpO2 98 %.   1. General frail female, laying in bed, no apparent distress  2. Normal affect and insight, Not Suicidal or Homicidal, Awake Alert, Oriented X 3.  3. No F.N deficits, ALL C.Nerves Intact, Strength 5/5 all 4 extremities, Sensation intact all 4 extremities, Plantars down going.  4. Ears and Eyes appear Normal, Conjunctivae clear, PERRLA.  I oral Mucosa.  5. Supple Neck, No JVD, No cervical lymphadenopathy appriciated, No Carotid Bruits.  6. Symmetrical Chest wall movement, Good air movement bilaterally, CTAB.  7. RRR, No Gallops, Rubs or Murmurs, No Parasternal Heave.  8. Positive Bowel Sounds, Abdomen Soft, No tenderness, No organomegaly appriciated,No rebound -guarding or rigidity.  9.  No Cyanosis, delayed skin Turgor, No Skin Rash or Bruise.  10. Good muscle tone,  joints appear normal , no effusions, left foot in Cam boot  11. No Palpable Lymph Nodes in Neck or Axillae   Data Review:    CBC Recent Labs  Lab 06/10/19 1403  WBC 7.1  HGB 10.4*  HCT 32.0*  PLT 182  MCV 98.5  MCH 32.0  MCHC 32.5  RDW 13.3  LYMPHSABS 1.3  MONOABS 0.4  EOSABS 0.1  BASOSABS 0.0   ------------------------------------------------------------------------------------------------------------------  Chemistries  Recent Labs  Lab 06/10/19 1403  NA 139  K 4.2  CL 104  CO2 24  GLUCOSE 101*  BUN 72*  CREATININE 6.46*  CALCIUM 9.0  AST 32  ALT 31  ALKPHOS 79  BILITOT 0.7    ------------------------------------------------------------------------------------------------------------------ estimated creatinine clearance is 8.4 mL/min (A) (by C-G formula based on SCr of 6.46 mg/dL (H)). ------------------------------------------------------------------------------------------------------------------ No results for input(s): TSH, T4TOTAL, T3FREE, THYROIDAB in the last 72 hours.  Invalid input(s): FREET3  Coagulation profile No results for input(s): INR, PROTIME in the last 168 hours. ------------------------------------------------------------------------------------------------------------------- No results for input(s): DDIMER in the last 72 hours. -------------------------------------------------------------------------------------------------------------------  Cardiac Enzymes No results for input(s): CKMB, TROPONINI, MYOGLOBIN in the last 168 hours.  Invalid input(s): CK ------------------------------------------------------------------------------------------------------------------ No results found for: BNP   ---------------------------------------------------------------------------------------------------------------  Urinalysis    Component Value Date/Time   COLORURINE YELLOW 06/10/2019 1353   APPEARANCEUR CLOUDY (A) 06/10/2019 1353   LABSPEC 1.018 06/10/2019 1353   PHURINE 5.0 06/10/2019 1353   GLUCOSEU NEGATIVE 06/10/2019 1353   HGBUR NEGATIVE 06/10/2019 1353   [place] 06/10/2019 1353   [place] 06/10/2019 1353   PROTEINUR 30 (A) 06/10/2019 1353   UROBILINOGEN 2.0 (H) 07/01/2014 1135   NITRITE NEGATIVE 06/10/2019 1353   LEUKOCYTESUR LARGE (A) 06/10/2019 1353    ----------------------------------------------------------------------------------------------------------------   Imaging Results:    DG Thoracic Spine 2 View  Result Date: 06/10/2019 CLINICAL DATA:  Recent syncopal episode with fall and back pain,  initial encounter EXAM: THORACIC SPINE 2 VIEWS COMPARISON:  01/31/2017 FINDINGS: T8 compression deformity is noted and stable from the prior exam consistent with  a chronic etiology. No acute compression is noted. Mild osteophytic changes are seen. IMPRESSION: Chronic T8 compression deformity.  Mild degenerative changes seen. Electronically Signed   By: Inez Catalina M.D.   On: 06/10/2019 15:05   DG Lumbar Spine Complete  Result Date: 06/10/2019 CLINICAL DATA:  Recent syncopal episode with back pain, initial encounter EXAM: LUMBAR SPINE - COMPLETE 4+ VIEW COMPARISON:  05/31/2017 FINDINGS: Five lumbar type vertebral bodies are well visualized. Vertebral body height is well maintained. Disc space narrowing is noted at L4-5. Mild osteophytic changes are seen. No pars defects are noted. Retrolisthesis of L4 with respect L5 is noted of a degenerative nature. The overall appearance is stable from the prior exam. No soft tissue abnormality is noted. IMPRESSION: Degenerative change as described without acute abnormality. Electronically Signed   By: Inez Catalina M.D.   On: 06/10/2019 15:01   DG Ankle Complete Left  Result Date: 06/10/2019 CLINICAL DATA:  Left ankle pain and swelling following a fall yesterday. EXAM: LEFT ANKLE COMPLETE - 3+ VIEW COMPARISON:  Report dated 07/30/2003. FINDINGS: Mild soft tissue swelling anteriorly and laterally. Small curvilinear calcific densities between the lateral malleolus and talus. Small effusion. IMPRESSION: 1. Small avulsion fracture fragments between the lateral malleolus and talus. 2. Small effusion. Electronically Signed   By: Claudie Revering M.D.   On: 06/10/2019 17:06   US Renal  Result Date: 06/10/2019 CLINICAL DATA:  Renal insufficiency. EXAM: RENAL / URINARY TRACT ULTRASOUND COMPLETE COMPARISON:  08/10/2018 FINDINGS: Right Kidney: Renal measurements: 11.7 x 6.2 x 4.5 cm = volume: 170 mL . Echogenicity within normal limits. No mass or hydronephrosis visualized. Left  Kidney: Renal measurements: 11.7 x 5.8 x 5.6 cm = volume: 198 mL. Echogenicity within normal limits. No mass or hydronephrosis visualized. Bladder: Appears normal for degree of bladder distention. Other: None. IMPRESSION: Normal examination, unchanged. Electronically Signed   By: Claudie Revering M.D.   On: 06/10/2019 17:42   DG Chest Portable 1 View  Result Date: 06/10/2019 CLINICAL DATA:  Syncope.  Hypotension.  Chest pain. EXAM: PORTABLE CHEST 1 VIEW COMPARISON:  10/07/2018 FINDINGS: The heart size and mediastinal contours are within normal limits. No pleural effusion or edema. Diffusely coarsened interstitial markings are identified bilaterally. No superimposed airspace consolidation. The visualized skeletal structures are unremarkable. IMPRESSION: 1. Chronic interstitial coarsening. 2. No acute cardiopulmonary abnormalities. Electronically Signed   By: Kerby Moors M.D.   On: 06/10/2019 14:32   DG Hip Unilat W or Wo Pelvis 2-3 Views Left  Result Date: 06/10/2019 CLINICAL DATA:  Recent syncopal episode with fall and left hip pain, initial encounter EXAM: DG HIP (WITH OR WITHOUT PELVIS) 3V LEFT COMPARISON:  05/31/2017 FINDINGS: There is no evidence of hip fracture or dislocation. There is no evidence of arthropathy or other focal bone abnormality. IMPRESSION: No acute abnormality noted. Electronically Signed   By: Inez Catalina M.D.   On: 06/10/2019 15:08    My personal review of EKG: Rhythm NSR, Rate  76 /min, QTc 448 , no Acute ST changes   Assessment & Plan:    Active Problems:   Acute renal failure (ARF) (HCC)   Essential hypertension   Gastroesophageal reflux disease   Depression with anxiety   Chronic renal failure, stage 3 (moderate)   Syncope   Metatarsal fracture    AKI on CKD stage II -Creatinine is 1.4, currently 6.5 on admission, this appears to be prerenal, in the setting of decreased oral/fluid intake continue to grief reaction from sister who  died last week, as well possible  ATN given hypotension on presentation, given she is on blood pressure medication with significant dose of Seroquel which would contribute to her hypotension and syncope. -Hold all medications which may contribute to hypotension including gabapentin and Seroquel, will hold her lisinopril. -Will check urine sodium and creatinine to calculate Fena. -Renal ultrasound with no evidence of obstruction or chronic kidney disease. -Continue with IV fluids and avoid hypotension.  Syncope -Secondary to hypotension, monitor on telemetry, continue with IV fluids  History of depression -Continue with home medication Effexor  Small avulsion fracture fragments between the lateral malleolus and talus of  left foot. -ED physician discussed with Dr. Aline Brochure, recommendation for cam boot, PT/OT/pain control, and outpatient follow-up with him.  Hypertension -Given she presents with syncope and low blood pressure, hold her medications include lisinopril  Hyperlipidemia -Hold statin given mildly elevated total CK  Insomnia -He is on Seroquel for insomnia, Seroquel will be held given low blood pressure, I will start on Ambien as needed.  COPD -No active wheezing, continue with home medications.  Grief reaction -Patient sister passed away last week.   DVT Prophylaxis Heparin -  Lovenox - SCDs   AM Labs Ordered, also please review Full Orders  Family Communication: Admission, patients condition and plan of care including tests being ordered have been discussed with the patient  who indicate understanding and agree with the plan and Code Status.  Code Status Full  Likely DC to  Home  Condition GUARDED    Consults called: None  Admission status:  Inpatient  Time spent in minutes : 60 minutes   Phillips Climes M.D on 06/10/2019 at 7:15 PM   [facility] - Office  [phone]

## 2019-06-10 NOTE — ED Provider Notes (Signed)
[facility] EMERGENCY DEPARTMENT Provider Note   CSN: [id number] Arrival date & time: 06/10/19  1325     History Chief Complaint  Patient presents with  . Chest Pain    Tanya Velazquez is a 59 y.o. female.  HPI      Tanya Velazquez is a 59 y.o. female, with a history of anxiety, B12 deficiency anemia, COPD, DM, GERD, HTN, presenting to the ED following an episode of syncope. Patient states she has been feeling poorly for the last 2 to 3 days. She has been experiencing intermittent lightheadedness, a burning sensation that extends from her left shoulder, left lateral chest, left flank into the left hip.  She also has discomfort that extends into the left upper abdomen.  She also has had jerking in her arms and legs for the last couple days. She has experienced 2 episodes of nonbilious, nonbloody emesis over the last 24 hours.  Persistent nausea. She felt poorly this morning upon waking.  She drove a family member to [place] and upon returning to her family member's house and trying to get into the car, her family member states patient lost consciousness.  She also complains of acute on chronic mid and lower back pain as well as left hip pain since the fall/syncope.  Denies any medication changes. Denies illicit drug or alcohol use. Patient denies current confusion, fever/chills, shortness of breath, numbness, focal weakness, diarrhea, hematochezia/melena, urinary symptoms, or any other complaints.    Past Medical History:  Diagnosis Date  . Anxiety   . B12 deficiency anemia   . Chronic constipation   . Chronic constipation   . Chronic low back pain    lumbar radiculopathy  . COPD (chronic obstructive pulmonary disease) ([place])   . Diabetes mellitus   . DJD (degenerative joint disease)   . GERD (gastroesophageal reflux disease) 04/13/07   EGD Dr Rourk->patulous EG junction, HH, antral erosions  . H/O hiatal hernia   . Hyperplastic colon polyp 06/27/2010  . Hypertension    . Migraines   . Osteoarthritis   . Raspy voice   . Smokers' cough ([place])   . SUI (stress urinary incontinence, female)     Patient Active Problem List   Diagnosis Date Noted  . Hypotension 06/09/2019  . Prolapsed internal hemorrhoids, grade 3 05/20/2019  . Rectal bleeding 02/04/2019  . Rectal pain 02/04/2019  . Syncope 10/07/2018  . Metatarsal fracture 10/07/2018  . Dizziness   . Essential hypertension   . Chest pain   . Gastroesophageal reflux disease   . Depression with anxiety   . CKD (chronic kidney disease), symptom management only, stage 2 (mild)   . Chronic renal failure, stage 3 (moderate)   . AKI (acute kidney injury) ([place]) 08/09/2018  . RUQ pain 04/10/2014  . Abdominal pain 04/10/2014  . Abnormal LFTs 04/10/2014  . Intrahepatic bile duct dilation 04/10/2014  . Common bile duct dilatation 04/10/2014  . Constipation 06/11/2011  . History of gastroesophageal reflux (GERD) 06/20/2010  . Family history of colon cancer 06/20/2010    Past Surgical History:  Procedure Laterality Date  . APPENDECTOMY    . BLADDER SUSPENSION  10/07/2011   Procedure: [facility] PROCEDURE;  Surgeon: Malka So, MD;  Location: [facility];  Service: Urology;  Laterality: N/A;  1 hour requested for this case   . CARDIAC CATHETERIZATION  08-09-2004 DR Einar Gip   NORMAL CORONARY ARTERIES/  NORMAL LVF  . CARDIAC CATHETERIZATION  2006   normal coronary arteries  .  COLONOSCOPY  3/31/9   Dr Rourk->friable anal canal  . COLONOSCOPY  0 06/27/10   Dr. Gala Romney hyperplastic polyp, prominent external hemorrhoid plexus likely source of hematochezia, long tortuous colon. next tcs 06/2015.  Marland Kitchen COLONOSCOPY WITH PROPOFOL N/A 04/07/2019   Procedure: COLONOSCOPY WITH PROPOFOL;  Surgeon: Daneil Dolin, MD;  [facility];  Service: Endoscopy;  Laterality: N/A;  2:00pm - spoke w/pt, she knows to arrive at 11:30, she could not take the AM slot due to transportation  . CYSTOSCOPY  10/07/2011    Procedure: CYSTOSCOPY;  Surgeon: Malka So, MD;  Location: [facility];  Service: Urology;  Laterality: N/A;  . ESOPHAGEAL MANOMETRY  2013   Dr. Derrill Kay at [facility]. incomplete bolus clearance with some breaks in contractions but there was peristalsis  . ESOPHAGOGASTRODUODENOSCOPY  06/27/2010   Schatzki ring s/p dilation, small hiatal hernia, couple tiny antral erosions.  Marland Kitchen EXCISION VOLAR GANGLION LEFT WRIST  06-27-2005  . HEAD-UP TILT TABLE TEST  01-21-2007 &  11-04-2004  DR Rollene Fare   HX CHRONIC RECURRENT SYNCOPAL. EPICODES--  NEGATIVE RESULT INCLUDING ISUPREL INFUSION  . HEMORRHOID SURGERY  09-13-2003  . LAPAROSCOPIC CHOLECYSTECTOMY  02-23-2007  . LAPAROTOMY W/ APPENDCTOMY AND LEFT SALPINGO-OOPHECTOMY  AGE 76'S  . pH/impedence  2013   Dr. Derrill Kay at [facility]. Increased reflux off PPI with excellent correlation between reflux events and regurgitation. Patient advised to hold PPI for study by Dr. Derrill Kay.   Marland Kitchen POLYPECTOMY  04/07/2019   Procedure: POLYPECTOMY;  Surgeon: Daneil Dolin, MD;  Location[facility];  Service: Endoscopy;;  . RIGHT SHOULDER ARTHROSCOPY/ DISTAL CLAVICLE RESECTION / [place] AND LABRUM TEAR/ BURSECTOMY  06-28-2010   IMPINGEMENT SYNDROME/ AC JOINT ARTHRITIS/ ROTATOR CUFF TENDINOPATHY  . SHOULDER ARTHROSCOPY  08-13-2010   LEFT SHOULDER IMPINGEMENT SYNDROME/ DJD OF AC JOINT  . STOMACH SURGERY  2013   antireflux surgery at [place]  age 1's     OB History    Gravida      Para      Term      Preterm      AB      Living  2     SAB      TAB      Ectopic      Multiple      Live Births              Family History  Problem Relation Age of Onset  . Colon cancer Mother 63  . Hypertension Mother   . Heart disease Mother 30       CABG  . Alzheimer's disease Father   . Hypertension Father   . Healthy Daughter   . Heart failure Sister   . Heart Problems Sister        Cardiac stent    Social  History   Tobacco Use  . Smoking status: Current Some Day Smoker    Packs/day: 1.00    Years: 35.00    Pack years: 35.00    Types: Cigarettes  . Smokeless tobacco: Never Used  Substance Use Topics  . Alcohol use: No    Alcohol/week: 0.0 standard drinks  . Drug use: No    Home Medications Prior to Admission medications   Medication Sig Start Date End Date Taking? Authorizing Provider  acetaminophen (TYLENOL) 325 MG tablet Take 2 tablets (650 mg total) by mouth every 6 (six) hours as needed for mild pain (or Fever >/=  101). 10/08/18   Roxan Hockey, MD  Ascorbic Acid (VITAMIN C PO) Take 1 tablet by mouth daily.    [provider]  buPROPion (WELLBUTRIN SR) 150 MG 12 hr tablet Take 1 tablet by mouth daily. 05/27/19   [provider]  Cholecalciferol (VITAMIN D3) 50 MCG (2000 UT) TABS Take 2,000 Units by mouth daily.    [provider]  ELDERBERRY PO Take 50 mg by mouth daily.    [provider]  gabapentin (NEURONTIN) 300 MG capsule Take 900 mg by mouth daily.     [provider]  hydrocortisone (ANUSOL-HC) 2.5 % rectal cream Place 1 application rectally 2 (two) times daily. Patient taking differently: Place 1 application rectally 2 (two) times daily as needed for hemorrhoids (discomfort).  02/04/19   Erenest Rasher, PA-C  ipratropium-albuterol (DUONEB) 0.5-2.5 (3) MG/3ML SOLN Inhale 3 mLs into the lungs every 6 (six) hours as needed for wheezing or shortness of breath. 03/10/19   [provider]  linaclotide Rolan Lipa) 290 MCG CAPS capsule Take 1 capsule (290 mcg total) by mouth daily before breakfast. 06/09/19   Annitta Needs, NP  lisinopril (ZESTRIL) 20 MG tablet Take 20 mg by mouth daily.    [provider]  Multiple Vitamin (MULTIVITAMIN WITH MINERALS) TABS tablet Take 1 tablet by mouth daily.    [provider]  oxyCODONE (OXY IR/ROXICODONE) 5 MG immediate release tablet Take 1 tablet (5 mg total) by mouth every  6 (six) hours as needed for severe pain. Patient taking differently: Take 5 mg by mouth in the morning, at noon, in the evening, and at bedtime.  08/10/18   Barton Dubois, MD  pravastatin (PRAVACHOL) 20 MG tablet Take 20 mg by mouth daily.  12/17/12   [provider]  PROAIR HFA 108 (90 BASE) MCG/ACT inhaler Inhale 1 puff into the lungs every 4 (four) hours as needed for wheezing or shortness of breath.  05/13/10   [provider]  QUEtiapine (SEROQUEL) 400 MG tablet Take 600 mg by mouth at bedtime.  02/25/18   [provider]  TRELEGY ELLIPTA 100-62.5-25 MCG/INH AEPB Inhale 1 puff into the lungs daily. 03/11/18   [provider]  venlafaxine XR (EFFEXOR-XR) 75 MG 24 hr capsule Take 225 mg by mouth daily with breakfast.     [provider]  vitamin B-12 (CYANOCOBALAMIN) 500 MCG tablet Take 500 mcg by mouth daily.    [provider]    Allergies    Amicinonide-benzyl alcohol [cyclocort], Amoxicillin, Doxycycline, Paxil [paroxetine hcl], Penicillins, Sulfa antibiotics, and Trimethoprim  Review of Systems   Review of Systems  Constitutional: Negative for chills, diaphoresis and fever.  Respiratory: Negative for cough and shortness of breath.   Cardiovascular: Positive for chest pain. Negative for leg swelling.  Gastrointestinal: Positive for abdominal pain, nausea and vomiting. Negative for blood in stool and diarrhea.  Genitourinary: Negative for dysuria and hematuria.  Musculoskeletal: Positive for arthralgias and back pain.  Neurological: Positive for syncope and light-headedness. Negative for numbness and headaches.  All other systems reviewed and are negative.   Physical Exam Updated Vital Signs BP (!) 90/49   Pulse 72   Temp 97.7 F (36.5 C) (Oral)   Resp 16   Ht 5\' 2"  (1.575 m)   Wt 66.7 kg   SpO2 91%   BMI 26.89 kg/m   Physical Exam Vitals and nursing note reviewed.  Constitutional:      General: She is not in acute  distress.  Appearance: She is well-developed. She is not diaphoretic.  HENT:     Head: Normocephalic and atraumatic.     Comments: No noted tenderness, swelling, wounds, deformities, or signs of instability to the scalp or face.    Nose: Nose normal.     Mouth/Throat:     Mouth: Mucous membranes are dry.     Pharynx: Oropharynx is clear.  Eyes:     Extraocular Movements: Extraocular movements intact.     Conjunctiva/sclera: Conjunctivae normal.     Pupils: Pupils are equal, round, and reactive to light.  Cardiovascular:     Rate and Rhythm: Normal rate and regular rhythm.     Pulses: Normal pulses.          Radial pulses are 2+ on the right side and 2+ on the left side.       Dorsalis pedis pulses are 2+ on the right side and 2+ on the left side.       Posterior tibial pulses are 2+ on the right side and 2+ on the left side.     Heart sounds: Normal heart sounds.     Comments: Tactile temperature in the extremities appropriate and equal bilaterally. Pulmonary:     Effort: Pulmonary effort is normal. No respiratory distress.     Breath sounds: Normal breath sounds.  Abdominal:     Palpations: Abdomen is soft.     Tenderness: There is no abdominal tenderness. There is no guarding.  Musculoskeletal:     Cervical back: Neck supple.     Right lower leg: No edema.     Left lower leg: No edema.     Comments: Some tenderness along the inferior thoracic spine into the lumbar spine without noted deformity, swelling, or signs of instability.  Tenderness to the left lateral hip without noted swelling, deformity, or signs of instability.  Tenderness and swelling to the left lateral malleolus.  No noted deformity.  The other major joints of the upper and lower extremities were palpated and ranged without noted pain, tenderness, swelling, or pain with range of motion.  Overall trauma exam performed without any abnormalities noted other than those mentioned.  Lymphadenopathy:     Cervical:  No cervical adenopathy.  Skin:    General: Skin is warm and dry.  Neurological:     Mental Status: She is alert and oriented to person, place, and time.     Comments: No noted acute cognitive deficit. Sensation grossly intact to light touch in the extremities.   Grip strengths equal bilaterally.   Strength 5/5 in all extremities.  No upright ataxia. Coordination intact.  Cranial nerves III-XII grossly intact.  Handles oral secretions without noted difficulty.  No noted phonation or speech deficit. No facial droop.   Psychiatric:        Mood and Affect: Mood and affect normal.        Speech: Speech normal.        Behavior: Behavior normal.     ED Results / Procedures / Treatments   Labs (all labs ordered are listed, but only abnormal results are displayed) Labs Reviewed  COMPREHENSIVE METABOLIC PANEL - Abnormal; Notable for the following components:      Result Value   Glucose, Bld 101 (*)    BUN 72 (*)    Creatinine, Ser 6.46 (*)    GFR calc non Af Amer 6 (*)    GFR calc Af Amer 7 (*)    All other components within normal limits  CBC WITH DIFFERENTIAL/PLATELET - Abnormal; Notable for the following components:   RBC 3.25 (*)    Hemoglobin 10.4 (*)    HCT 32.0 (*)    All other components within normal limits  URINALYSIS, ROUTINE W REFLEX MICROSCOPIC - Abnormal; Notable for the following components:   APPearance CLOUDY (*)    Protein, ur 30 (*)    Leukocytes,Ua LARGE (*)    WBC, UA >50 (*)    Bacteria, UA FEW (*)    Non Squamous Epithelial 0-5 (*)    All other components within normal limits  RAPID URINE DRUG SCREEN, HOSP PERFORMED - Abnormal; Notable for the following components:   Benzodiazepines POSITIVE (*)    All other components within normal limits  SARS CORONAVIRUS 2 BY RT PCR (HOSPITAL ORDER, [facility])  ETHANOL  TROPONIN I (HIGH SENSITIVITY)  TROPONIN I (HIGH SENSITIVITY)   Hemoglobin  Date Value Ref Range Status  06/10/2019  10.4 (L) 12.0 - 15.0 g/dL Final  04/05/2019 11.7 (L) 12.0 - 15.0 g/dL Final  10/08/2018 12.0 12.0 - 15.0 g/dL Final  10/07/2018 14.2 12.0 - 15.0 g/dL Final    BUN  Date Value Ref Range Status  06/10/2019 72 (H) 6 - 20 mg/dL Final  04/05/2019 14 6 - 20 mg/dL Final  10/08/2018 13 6 - 20 mg/dL Final  10/07/2018 16 6 - 20 mg/dL Final   Creat  Date Value Ref Range Status  04/10/2014 0.81 0.50 - 1.10 mg/dL Final   Creatinine, Ser  Date Value Ref Range Status  06/10/2019 6.46 (H) 0.44 - 1.00 mg/dL Final  04/05/2019 1.10 (H) 0.44 - 1.00 mg/dL Final  10/08/2018 1.01 (H) 0.44 - 1.00 mg/dL Final  10/07/2018 1.25 (H) 0.44 - 1.00 mg/dL Final     EKG EKG Interpretation  Date/Time:  Friday Jun 10 2019 13:48:53 EDT Ventricular Rate:  76 PR Interval:    QRS Duration: 94 QT Interval:  398 QTC Calculation: 448 R Axis:   43 Text Interpretation: Sinus rhythm Low voltage, extremity and precordial leads Confirmed by Elnora Morrison [phone]) on 06/10/2019 2:51:43 PM   Radiology DG Thoracic Spine 2 View  Result Date: 06/10/2019 CLINICAL DATA:  Recent syncopal episode with fall and back pain, initial encounter EXAM: THORACIC SPINE 2 VIEWS COMPARISON:  01/31/2017 FINDINGS: T8 compression deformity is noted and stable from the prior exam consistent with a chronic etiology. No acute compression is noted. Mild osteophytic changes are seen. IMPRESSION: Chronic T8 compression deformity.  Mild degenerative changes seen. Electronically Signed   By: Inez Catalina M.D.   On: 06/10/2019 15:05   DG Lumbar Spine Complete  Result Date: 06/10/2019 CLINICAL DATA:  Recent syncopal episode with back pain, initial encounter EXAM: LUMBAR SPINE - COMPLETE 4+ VIEW COMPARISON:  05/31/2017 FINDINGS: Five lumbar type vertebral bodies are well visualized. Vertebral body height is well maintained. Disc space narrowing is noted at L4-5. Mild osteophytic changes are seen. No pars defects are noted. Retrolisthesis of L4 with  respect L5 is noted of a degenerative nature. The overall appearance is stable from the prior exam. No soft tissue abnormality is noted. IMPRESSION: Degenerative change as described without acute abnormality. Electronically Signed   By: Inez Catalina M.D.   On: 06/10/2019 15:01   DG Ankle Complete Left  Result Date: 06/10/2019 CLINICAL DATA:  Left ankle pain and swelling following a fall yesterday. EXAM: LEFT ANKLE COMPLETE - 3+ VIEW COMPARISON:  Report dated 07/30/2003. FINDINGS: Mild soft tissue swelling anteriorly and  laterally. Small curvilinear calcific densities between the lateral malleolus and talus. Small effusion. IMPRESSION: 1. Small avulsion fracture fragments between the lateral malleolus and talus. 2. Small effusion. Electronically Signed   By: Claudie Revering M.D.   On: 06/10/2019 17:06   US Renal  Result Date: 06/10/2019 CLINICAL DATA:  Renal insufficiency. EXAM: RENAL / URINARY TRACT ULTRASOUND COMPLETE COMPARISON:  08/10/2018 FINDINGS: Right Kidney: Renal measurements: 11.7 x 6.2 x 4.5 cm = volume: 170 mL . Echogenicity within normal limits. No mass or hydronephrosis visualized. Left Kidney: Renal measurements: 11.7 x 5.8 x 5.6 cm = volume: 198 mL. Echogenicity within normal limits. No mass or hydronephrosis visualized. Bladder: Appears normal for degree of bladder distention. Other: None. IMPRESSION: Normal examination, unchanged. Electronically Signed   By: Claudie Revering M.D.   On: 06/10/2019 17:42   DG Chest Portable 1 View  Result Date: 06/10/2019 CLINICAL DATA:  Syncope.  Hypotension.  Chest pain. EXAM: PORTABLE CHEST 1 VIEW COMPARISON:  10/07/2018 FINDINGS: The heart size and mediastinal contours are within normal limits. No pleural effusion or edema. Diffusely coarsened interstitial markings are identified bilaterally. No superimposed airspace consolidation. The visualized skeletal structures are unremarkable. IMPRESSION: 1. Chronic interstitial coarsening. 2. No acute cardiopulmonary  abnormalities. Electronically Signed   By: Kerby Moors M.D.   On: 06/10/2019 14:32   DG Hip Unilat W or Wo Pelvis 2-3 Views Left  Result Date: 06/10/2019 CLINICAL DATA:  Recent syncopal episode with fall and left hip pain, initial encounter EXAM: DG HIP (WITH OR WITHOUT PELVIS) 3V LEFT COMPARISON:  05/31/2017 FINDINGS: There is no evidence of hip fracture or dislocation. There is no evidence of arthropathy or other focal bone abnormality. IMPRESSION: No acute abnormality noted. Electronically Signed   By: Inez Catalina M.D.   On: 06/10/2019 15:08    Summary of Echo performed 10/08/2018: IMPRESSIONS    1. Left ventricular ejection fraction, by visual estimation, is 60 to  65%. The left ventricle has normal function. Normal left ventricular size.  There is mildly increased left ventricular hypertrophy.  2. Global right ventricle has normal systolic function.The right  ventricular size is normal. No increase in right ventricular wall  thickness.  3. Left atrial size was normal.  4. Right atrial size was normal.  5. Presence of pericardial fat pad.  6. The mitral valve is grossly normal. No evidence of mitral valve  regurgitation.  7. The tricuspid valve is grossly normal. Tricuspid valve regurgitation  is trivial.  8. The aortic valve is tricuspid Aortic valve regurgitation was not  visualized by color flow Doppler.  9. The pulmonic valve was grossly normal. Pulmonic valve regurgitation is  not visualized by color flow Doppler.  10. TR signal is inadequate for assessing pulmonary artery systolic  pressure.  11. The inferior vena cava is normal in size with greater than 50%  respiratory variability, suggesting right atrial pressure of 3 mmHg.  Procedures .Critical Care Performed by: Lorayne Bender, PA-C Authorized by: Lorayne Bender, PA-C   Critical care provider statement:    Critical care time (minutes):  45   Critical care time was exclusive of:  Separately billable  procedures and treating other patients   Critical care was necessary to treat or prevent imminent or life-threatening deterioration of the following conditions:  Renal failure   Critical care was time spent personally by me on the following activities:  Ordering and performing treatments and interventions, ordering and review of laboratory studies, ordering and review of radiographic  studies, pulse oximetry, re-evaluation of patient's condition, review of old charts, development of treatment plan with patient or surrogate, discussions with consultants, evaluation of patient's response to treatment, examination of patient and obtaining history from patient or surrogate   I assumed direction of critical care for this patient from another provider in my specialty: no     (including critical care time)  Medications Ordered in ED Medications  sodium chloride 0.9 % bolus 1,000 mL (has no administration in time range)  sodium chloride 0.9 % bolus 1,000 mL (0 mLs Intravenous Stopped 06/10/19 1626)  ondansetron (ZOFRAN) injection 4 mg (4 mg Intravenous Given 06/10/19 1416)  oxyCODONE (Oxy IR/ROXICODONE) immediate release tablet 5 mg (5 mg Oral Given 06/10/19 1706)    ED Course  I have reviewed the triage vital signs and the nursing notes.  Pertinent labs & imaging results that were available during my care of the patient were reviewed by me and considered in my medical decision making (see chart for details).  Clinical Course as of Jun 10 1950  Fri Jun 10, 2019  1529 Called CT to cancel dissection study due to patient's creatinine.    [SJ]  [id number] Despite these continued readings of relative hypoxia, I was noting SPO2 92 to 94%.  No evidence of respiratory distress.  SpO2: 90 % [SJ]  1741 Spoke with Dr. Aline Brochure, orthopedic surgeon. Agrees with plan for cam boot, weightbearing as tolerated. Office follow up.  DG Ankle Complete Left [SJ]  [id number] Spoke with hospitalist.    [SJ]    Clinical Course User  Index [SJ] Anthonee Gelin, Helane Gunther, PA-C   MDM Rules/Calculators/A&P                      Patient presents for evaluation following a syncopal episode.   Hypotensive on scene with EMS, improved with IV fluids.  Still hypotensive upon ED arrival.  Continued to improve with IV fluids. Patient nontoxic-appearing, not tachycardic/bradycardic, no obvious distress. Patient dry on exam.  Hypothyroid was considered, however, patient is normothermic and no bradycardia.  No altered mental status.  Patient was able to spontaneously urinate here in the ED and post void residual was 20 cc.  I personally reviewed and interpreted the patient's lab work and imaging studies. No acute abnormality on renal ultrasound. Evidence of small avulsion fracture on left ankle.  Other imaging studies without acute abnormality.  Patient has evidence of acute renal failure with creatinine of 6.46 and BUN of 72.  Patient admitted for further management.   Findings and plan of care discussed with Idelia Salm, MD. Dr. Reather Converse personally evaluated and examined this patient.  Vitals:   06/10/19 1431 06/10/19 1440 06/10/19 1500 06/10/19 1530  BP: (!) 82/46  (!) 95/56 101/65  Pulse: 81 69 70 67  Resp:  17 17 16   Temp:      TempSrc:      SpO2: (!) 89% 91% 92% 90%  Weight:      Height:         Final Clinical Impression(s) / ED Diagnoses Final diagnoses:  Acute renal failure, unspecified acute renal failure type ([place])  Syncope and collapse    Rx / DC Orders ED Discharge Orders    None       Layla Maw 06/10/19 1952    Elnora Morrison, MD 06/15/19 1609

## 2019-06-10 NOTE — ED Notes (Signed)
Pt given a meal tray 

## 2019-06-10 NOTE — ED Notes (Signed)
Pt gone to xray

## 2019-06-10 NOTE — ED Notes (Signed)
Pt back from CT

## 2019-06-11 DIAGNOSIS — N179 Acute kidney failure, unspecified: Secondary | ICD-10-CM | POA: Diagnosis present

## 2019-06-11 DIAGNOSIS — N1832 Chronic kidney disease, stage 3b: Secondary | ICD-10-CM

## 2019-06-11 DIAGNOSIS — S92301D Fracture of unspecified metatarsal bone(s), right foot, subsequent encounter for fracture with routine healing: Secondary | ICD-10-CM

## 2019-06-11 DIAGNOSIS — K219 Gastro-esophageal reflux disease without esophagitis: Secondary | ICD-10-CM

## 2019-06-11 LAB — COMPREHENSIVE METABOLIC PANEL
ALT: 28 U/L (ref 0–44)
AST: 32 U/L (ref 15–41)
Albumin: 3.4 g/dL — ABNORMAL LOW (ref 3.5–5.0)
Alkaline Phosphatase: 73 U/L (ref 38–126)
Anion gap: 7 (ref 5–15)
BUN: 48 mg/dL — ABNORMAL HIGH (ref 6–20)
CO2: 23 mmol/L (ref 22–32)
Calcium: 9 mg/dL (ref 8.9–10.3)
Chloride: 111 mmol/L (ref 98–111)
Creatinine, Ser: 2.94 mg/dL — ABNORMAL HIGH (ref 0.44–1.00)
GFR calc Af Amer: 19 mL/min — ABNORMAL LOW (ref >60)
GFR calc non Af Amer: 17 mL/min — ABNORMAL LOW (ref >60)
Glucose, Bld: 85 mg/dL (ref 70–99)
Potassium: 4.7 mmol/L (ref 3.5–5.1)
Sodium: 141 mmol/L (ref 135–145)
Total Bilirubin: 0.5 mg/dL (ref 0.3–1.2)
Total Protein: 6.1 g/dL — ABNORMAL LOW (ref 6.5–8.1)

## 2019-06-11 LAB — CBC
HCT: 31.6 % — ABNORMAL LOW (ref 36.0–46.0)
Hemoglobin: 10 g/dL — ABNORMAL LOW (ref 12.0–15.0)
MCH: 31.6 pg (ref 26.0–34.0)
MCHC: 31.6 g/dL (ref 30.0–36.0)
MCV: 100 fL (ref 80.0–100.0)
Platelets: 183 10*3/uL (ref 150–400)
RBC: 3.16 MIL/uL — ABNORMAL LOW (ref 3.87–5.11)
RDW: 13.2 % (ref 11.5–15.5)
WBC: 7.4 10*3/uL (ref 4.0–10.5)
nRBC: 0 % (ref 0.0–0.2)

## 2019-06-11 LAB — MRSA PCR SCREENING: MRSA by PCR: NEGATIVE

## 2019-06-11 LAB — TSH: TSH: 1.351 u[IU]/mL (ref 0.350–4.500)

## 2019-06-11 LAB — CREATININE, URINE, RANDOM: Creatinine, Urine: 247.16 mg/dL

## 2019-06-11 LAB — CORTISOL: Cortisol, Plasma: 17.5 ug/dL

## 2019-06-11 LAB — T4, FREE: Free T4: 1.82 ng/dL — ABNORMAL HIGH (ref 0.61–1.12)

## 2019-06-11 LAB — SODIUM, URINE, RANDOM: Sodium, Ur: 47 mmol/L

## 2019-06-11 MED ORDER — FAMOTIDINE 20 MG PO TABS
20.0000 mg | ORAL_TABLET | Freq: Every day | ORAL | Status: DC
  Administered 2019-06-11 – 2019-06-12 (×2): 20 mg via ORAL
  Filled 2019-06-11 (×2): qty 1

## 2019-06-11 MED ORDER — CHLORHEXIDINE GLUCONATE CLOTH 2 % EX PADS
6.0000 | MEDICATED_PAD | Freq: Every day | CUTANEOUS | Status: DC
  Administered 2019-06-11: 6 via TOPICAL

## 2019-06-11 MED ORDER — ALBUTEROL SULFATE (2.5 MG/3ML) 0.083% IN NEBU: 2.5000 mg | INHALATION_SOLUTION | RESPIRATORY_TRACT | Status: DC | PRN

## 2019-06-11 NOTE — Progress Notes (Signed)
PROGRESS NOTE    Tanya Velazquez  [id number] DOB: [birth year] DOA: 06/10/2019 PCP: Lucia Gaskins, MD   Chief Complaint  Patient presents with  . Chest Pain    Brief Narrative:  As per H&P written by Dr. Waldron Labs on 06/10/19 59 y.o. female, with past medical history of anxiety, B12 deficiency anemia, COPD, diabetes mellitus, GERD, hypertension, presenting to emergency room with syncope, patient reports she has not been feeling well for last few days, reports she has lightheadedness, dizziness, and left shoulder burning sensation, left flank and left hip burning sensation as well, patient reports her sister passed away 10 days ago, she used to live with her, reports she has been feeling sad, with overall poor oral intake, and poor fluid intake as well, even herself mentioned she knows she does not drink enough fluids, and mainly she drinks a lot of [facility], as well she does report 2 episodes of vomiting, nonbilious, nonbloody, over last 24 hours, and reports poor appetite and nausea. -Patient had syncopal episode today, when she was trying to get into her car, she had total loss of consciousness, but no head trauma. - in ED work-up was significant for hypotension, clinically dry and dehydrated, and creatinine elevated at 6.46 from baseline of 1.4, EKG was nonacute, patient did respond well to IV fluids, as well patient had left ankle pain status post her syncope, x-ray significant for Small avulsion fracture fragments between the lateral malleolus and talus of  left foot.[facility]  hospitalist requested to admit.   Assessment & Plan: 1-acute on chronic renal failure -Chronic kidney disease a stage IIIa at baseline -Appears to be in the setting of prerenal azotemia from dehydration, continue use of nephrotoxic agents and UTI -Currently without dysuria -Renal ultrasound without obstructive uropathy -Continue IV fluids and supportive care -Continue treatment for UTI and follow urine  cultures -Creatinine trending down appropriately -Maintain adequate hydration and avoid hypotension.  2-Essential hypertension -Self blood pressure currently -Avoid antihypertensive agents at this time. -Follow vital signs.  3-Gastroesophageal reflux disease -Will use famotidine -No reflux symptoms currently.  4-Depression with anxiety -No suicidal ideation or hallucination -Flat affect and ongoing grief reaction after losing her sister. -continue Effexor and bupropion   5-Metatarsal fracture -Cam boot in place -PT eval ordered -outpatient follow up with Dr. Aline Brochure  6-COPD -No wheezing, no shortness of breath -Continue home bronchodilator regimen.   DVT prophylaxis: Heparin Code Status: Full code Family Communication: No family at bedside Disposition:   Status is: Inpatient  Dispo: The patient is from: home               Anticipated d/c is to: home with home health most likely              Anticipated d/c date is: 06/12/19; if able to demonstrate capability of maintaining oral nutrition/Hydration and her renal Function close to baseline.              Patient currently no medically ready for discharge as patient is still with elevated creatinine from baseline; slowly advancing diet and securing oral hydration prior to discharge.  Continue supportive care and IV fluids.  Minimize the use of nephrotoxic's and avoid hypotension.        Consultants:   None   Procedures:  See below for x-ray reports.   Antimicrobials:  Rocephin   Subjective: No fever, no nausea, no vomiting.  Reports feeling better even, she is still weak and deconditioned.  Denies dysuria.  Objective: Vitals:  06/11/19 0842 06/11/19 0900 06/11/19 0901 06/11/19 1100  BP:  (!) 95/51    Pulse:  74 77   Resp:  (!) 22 17   Temp:    97.9 F (36.6 C)  TempSrc:    Oral  SpO2: 90% 92%    Weight:      Height:        Intake/Output Summary (Last 24 hours) at 06/11/2019 1734 Last data filed  at 06/11/2019 0304 Gross per 24 hour  Intake 783.14 ml  Output --  Net 783.14 ml   Filed Weights   06/10/19 1330  Weight: 66.7 kg    Examination:  General exam: Appears calm and comfortable, no acute distress.  Reports no nausea, no vomiting.  Patient is afebrile.  No chest pain or shortness of breath currently. Respiratory system: Clear to auscultation. Respiratory effort normal. Cardiovascular system: S1 & S2 heard, RRR. No JVD, murmurs, rubs, gallops or clicks. No pedal edema. Gastrointestinal system: Abdomen is nondistended, soft and nontender. No organomegaly or masses felt. Normal bowel sounds heard. Central nervous system: Alert and oriented. No focal neurological deficits. Extremities: Symmetric 4 x 5 power in the setting of poor effort; no cyanosis or clubbing. Left foot in Cam boot.. Skin: No rashes, no petechiae. Psychiatry: Judgement and insight appear normal. Mood & affect appropriate.     Data Reviewed: I have personally reviewed following labs and imaging studies  CBC: Recent Labs  Lab 06/10/19 1403 06/11/19 0449  WBC 7.1 7.4  NEUTROABS 5.3  --   HGB 10.4* 10.0*  HCT 32.0* 31.6*  MCV 98.5 100.0  PLT 182 [id number]    Basic Metabolic Panel: Recent Labs  Lab 06/10/19 1403 06/11/19 0449  NA 139 141  K 4.2 4.7  CL 104 111  CO2 24 23  GLUCOSE 101* 85  BUN 72* 48*  CREATININE 6.46* 2.94*  CALCIUM 9.0 9.0    GFR: Estimated Creatinine Clearance: 18.4 mL/min (A) (by C-G formula based on SCr of 2.94 mg/dL (H)).  Liver Function Tests: Recent Labs  Lab 06/10/19 1403 06/11/19 0449  AST 32 32  ALT 31 28  ALKPHOS 79 73  BILITOT 0.7 0.5  PROT 6.8 6.1*  ALBUMIN 3.9 3.4*    CBG: No results for input(s): GLUCAP in the last 168 hours.   Recent Results (from the past 240 hour(s))  SARS Coronavirus 2 by RT PCR (hospital order, performed in [facility] hospital lab) Nasopharyngeal Nasopharyngeal Swab     Status: None   Collection Time: 06/10/19  5:51 PM    Specimen: Nasopharyngeal Swab  Result Value Ref Range Status   SARS Coronavirus 2 NEGATIVE NEGATIVE Final    Comment: (NOTE) SARS-CoV-2 target nucleic acids are NOT DETECTED. The SARS-CoV-2 RNA is generally detectable in upper and lower respiratory specimens during the acute phase of infection. The lowest concentration of SARS-CoV-2 viral copies this assay can detect is 250 copies / mL. A negative result does not preclude SARS-CoV-2 infection and should not be used as the sole basis for treatment or other patient management decisions.  A negative result may occur with improper specimen collection / handling, submission of specimen other than nasopharyngeal swab, presence of viral mutation(s) within the areas targeted by this assay, and inadequate number of viral copies (<250 copies / mL). A negative result must be combined with clinical observations, patient history, and epidemiological information. Fact Sheet for Patients:   StrictlyIdeas.no Fact Sheet for Healthcare Providers: BankingDealers.co.za This test is not yet approved or cleared  by the [place] and has been authorized for detection and/or diagnosis of SARS-CoV-2 by FDA under an Emergency Use Authorization (EUA).  This EUA will remain in effect (meaning this test can be used) for the duration of the COVID-19 declaration under Section 564(b)(1) of the Act, 21 U.S.C. section 360bbb-3(b)(1), unless the authorization is terminated or revoked sooner. Performed at [facility], [address], [place], [place] [address]   MRSA PCR Screening     Status: None   Collection Time: 06/11/19  1:17 AM   Specimen: Nasal Mucosa; Nasopharyngeal  Result Value Ref Range Status   MRSA by PCR NEGATIVE NEGATIVE Final    Comment:        The GeneXpert MRSA Assay (FDA approved for NASAL specimens only), is one component of a comprehensive MRSA colonization surveillance program. It is  not intended to diagnose MRSA infection nor to guide or monitor treatment for MRSA infections. Performed at [facility], [address] [place], [place] [address]      Radiology Studies: DG Thoracic Spine 2 View  Result Date: 06/10/2019 CLINICAL DATA:  Recent syncopal episode with fall and back pain, initial encounter EXAM: THORACIC SPINE 2 VIEWS COMPARISON:  01/31/2017 FINDINGS: T8 compression deformity is noted and stable from the prior exam consistent with a chronic etiology. No acute compression is noted. Mild osteophytic changes are seen. IMPRESSION: Chronic T8 compression deformity.  Mild degenerative changes seen. Electronically Signed   By: Inez Catalina M.D.   On: 06/10/2019 15:05   DG Lumbar Spine Complete  Result Date: 06/10/2019 CLINICAL DATA:  Recent syncopal episode with back pain, initial encounter EXAM: LUMBAR SPINE - COMPLETE 4+ VIEW COMPARISON:  05/31/2017 FINDINGS: Five lumbar type vertebral bodies are well visualized. Vertebral body height is well maintained. Disc space narrowing is noted at L4-5. Mild osteophytic changes are seen. No pars defects are noted. Retrolisthesis of L4 with respect L5 is noted of a degenerative nature. The overall appearance is stable from the prior exam. No soft tissue abnormality is noted. IMPRESSION: Degenerative change as described without acute abnormality. Electronically Signed   By: Inez Catalina M.D.   On: 06/10/2019 15:01   DG Ankle Complete Left  Result Date: 06/10/2019 CLINICAL DATA:  Left ankle pain and swelling following a fall yesterday. EXAM: LEFT ANKLE COMPLETE - 3+ VIEW COMPARISON:  Report dated 07/30/2003. FINDINGS: Mild soft tissue swelling anteriorly and laterally. Small curvilinear calcific densities between the lateral malleolus and talus. Small effusion. IMPRESSION: 1. Small avulsion fracture fragments between the lateral malleolus and talus. 2. Small effusion. Electronically Signed   By: Claudie Revering M.D.   On: 06/10/2019 17:06    US Renal  Result Date: 06/10/2019 CLINICAL DATA:  Renal insufficiency. EXAM: RENAL / URINARY TRACT ULTRASOUND COMPLETE COMPARISON:  08/10/2018 FINDINGS: Right Kidney: Renal measurements: 11.7 x 6.2 x 4.5 cm = volume: 170 mL . Echogenicity within normal limits. No mass or hydronephrosis visualized. Left Kidney: Renal measurements: 11.7 x 5.8 x 5.6 cm = volume: 198 mL. Echogenicity within normal limits. No mass or hydronephrosis visualized. Bladder: Appears normal for degree of bladder distention. Other: None. IMPRESSION: Normal examination, unchanged. Electronically Signed   By: Claudie Revering M.D.   On: 06/10/2019 17:42   DG Chest Portable 1 View  Result Date: 06/10/2019 CLINICAL DATA:  Syncope.  Hypotension.  Chest pain. EXAM: PORTABLE CHEST 1 VIEW COMPARISON:  10/07/2018 FINDINGS: The heart size and mediastinal contours are within normal limits. No pleural effusion or edema. Diffusely coarsened  interstitial markings are identified bilaterally. No superimposed airspace consolidation. The visualized skeletal structures are unremarkable. IMPRESSION: 1. Chronic interstitial coarsening. 2. No acute cardiopulmonary abnormalities. Electronically Signed   By: Kerby Moors M.D.   On: 06/10/2019 14:32   DG Hip Unilat W or Wo Pelvis 2-3 Views Left  Result Date: 06/10/2019 CLINICAL DATA:  Recent syncopal episode with fall and left hip pain, initial encounter EXAM: DG HIP (WITH OR WITHOUT PELVIS) 3V LEFT COMPARISON:  05/31/2017 FINDINGS: There is no evidence of hip fracture or dislocation. There is no evidence of arthropathy or other focal bone abnormality. IMPRESSION: No acute abnormality noted. Electronically Signed   By: Inez Catalina M.D.   On: 06/10/2019 15:08        Scheduled Meds: . buPROPion  150 mg Oral Daily  . Chlorhexidine Gluconate Cloth  6 each Topical Daily  . fluticasone furoate-vilanterol  1 puff Inhalation Daily   And  . umeclidinium bromide  1 puff Inhalation Daily  . heparin  5,000  Units Subcutaneous Q8H  . venlafaxine XR  225 mg Oral Q breakfast  . vitamin B-12  500 mcg Oral Daily   Continuous Infusions: . cefTRIAXone (ROCEPHIN)  IV 1 g (06/10/19 2136)  . lactated ringers 125 mL/hr at 06/11/19 0946     LOS: 1 day    Time spent: 30 minutes.    Barton Dubois, MD [facility]   To contact the attending provider between 7A-7P or the covering provider during after hours 7P-7A, please log into the web site [email or web address] and access using universal [address] password for that web site. If you do not have the password, please call the hospital operator.  06/11/2019, 5:34 PM

## 2019-06-12 LAB — BASIC METABOLIC PANEL
Anion gap: 5 (ref 5–15)
BUN: 26 mg/dL — ABNORMAL HIGH (ref 6–20)
CO2: 25 mmol/L (ref 22–32)
Calcium: 9.2 mg/dL (ref 8.9–10.3)
Chloride: 112 mmol/L — ABNORMAL HIGH (ref 98–111)
Creatinine, Ser: 1.2 mg/dL — ABNORMAL HIGH (ref 0.44–1.00)
GFR calc Af Amer: 57 mL/min — ABNORMAL LOW (ref >60)
GFR calc non Af Amer: 49 mL/min — ABNORMAL LOW (ref >60)
Glucose, Bld: 92 mg/dL (ref 70–99)
Potassium: 5 mmol/L (ref 3.5–5.1)
Sodium: 142 mmol/L (ref 135–145)

## 2019-06-12 MED ORDER — QUETIAPINE FUMARATE 400 MG PO TABS: 400.0000 mg | ORAL_TABLET | Freq: Every day | ORAL | Status: DC

## 2019-06-12 MED ORDER — FAMOTIDINE 20 MG PO TABS: 20.0000 mg | ORAL_TABLET | Freq: Every day | ORAL | 1 refills | Status: DC

## 2019-06-12 NOTE — Plan of Care (Signed)

## 2019-06-12 NOTE — Discharge Summary (Signed)
Physician Discharge Summary  Tanya Velazquez [id number] DOB: [birth year] DOA: 06/10/2019  PCP: Lucia Gaskins, MD  Admit date: 06/10/2019 Discharge date: 06/12/2019  Time spent: 35 minutes  Recommendations for Outpatient Follow-up:  1. Repeat basic metabolic panel to follow lites and renal function. 2. Reassess blood pressure and if needed initiate treatment with antihypertensive agents.   Discharge Diagnoses:  Active Problems:   Acute renal failure (ARF) (HCC)   Essential hypertension   Gastroesophageal reflux disease   Depression with anxiety   Chronic renal failure, stage 3 (moderate)   Syncope   Metatarsal fracture   AKI (acute kidney injury) ([place])   Discharge Condition: Stable and improved.  Discharged home with instruction to follow-up with PCP and orthopedic service.  CODE STATUS: Full code.  Diet recommendation: Heart healthy diet.  Filed Weights   06/10/19 1330 06/11/19 2214  Weight: 66.7 kg 73.3 kg    History of present illness:  As per H&P written by Dr. Waldron Labs on 06/10/19 59 y.o.female,with past medical history of anxiety, B12 deficiency anemia, COPD, diabetes mellitus, GERD, hypertension, presenting to emergency room with syncope, patient reports she has not been feeling well for last few days, reports she has lightheadedness, dizziness, and left shoulder burning sensation, left flank and left hip burning sensation as well, patient reports her sister passed away 10 days ago, she used to live with her, reports she has been feeling sad, with overall poor oral intake, and poor fluid intake as well, even herself mentioned she knows she does not drink enough fluids, and mainly she drinks a lot of [facility], as well she does report 2 episodes of vomiting, nonbilious, nonbloody, over last 24 hours, and reports poor appetite and nausea. -Patient had syncopal episode today, when she was trying to get into her car, she had total loss of consciousness, but no head  trauma. - in [place] was significant for hypotension, clinically dry and dehydrated, and creatinine elevated at 6.46 from baseline of 1.4, EKG was nonacute, patient did respond well to IV fluids, as well patient had left ankle pain status post her syncope, x-ray significant forSmall avulsion fracture fragments between the lateral malleolus and talusofleft foot.[facility]hospitalist requested to admit.  Hospital Course:  1-acute on chronic renal failure -Chronic kidney disease a stage IIIa at baseline -Appears to be in the setting of prerenal azotemia from dehydration, continue use of nephrotoxic agents and possible UTI. -Currently without dysuria, no fever and normal WBCs; 2 doses of Rocephin were given.  At this time most likely colonization than true infection.  No further antibiotics will be continue. -Renal ultrasound without obstructive uropathy. -After fluid resuscitation given creatinine 1.2 at discharge -Patient instructed to maintain adequate hydration and to follow heart healthy diet. -Follow-up with PCP in 10 days.  2-Essential hypertension -Soft but stable blood pressure currently -Advised to follow heart healthy diet. -Patient instructed to maintain adequate hydration.  3-Gastroesophageal reflux disease -Will use famotidine -No reflux symptoms currently.  4-Depression with anxiety -No suicidal ideation or hallucination -Flat affect and ongoing grief reaction after losing her sister appreciated on exam.. -continue Effexor and bupropion   5-Metatarsal fracture -Cam boot in place -As needed analgesics and Tylenol. -outpatient follow up with Dr. Aline Brochure  6-COPD -No wheezing, no shortness of breath -Continue home bronchodilator regimen.  7-pyuria -Patient reports no fever, normal WBCs, no dysuria. -Empirically treated with 2 doses of Rocephin while waiting on culture results. -No further antibiotics will be pursued -Patient advised to maintain adequate  hydration.  8-hyperlipidemia -Continue statins.  Procedures: See below for x-ray reports  Consultations:  Orthopedic service (Dr. Aline Brochure; contacted by EDP at time of admission recommendations given for cam boot and outpatient follow-up in 2 weeks).  Discharge Exam: Vitals:   06/12/19 0730 06/12/19 1432  BP:  134/73  Pulse:  67  Resp:  20  Temp:  98.5 F (36.9 C)  SpO2: 94% 100%    General: Afebrile, no chest pain, no nausea, no vomiting.  Reports feeling better and stronger and ready to go home. Cardiovascular: S1 and S2, no rubs, no gallops, no murmurs. Respiratory: Positive scattered rhonchi; no wheezing, no crackles, no using accessory muscle.  Normal respiratory effort.  Good oxygen saturation on room air. Abdomen: Soft, nontender, distended, positive bowel sounds. Extremities: No cyanosis, no clubbing.  Left foot with Cam boot in place; reports pain to be well tolerated.  Discharge Instructions   Discharge Instructions    Diet - low sodium heart healthy   Complete by: As directed    Discharge instructions   Complete by: As directed    Take medications as prescribed. Maintain adequate hydration. Follow heart healthy diet. Follow-up with orthopedic surgery service (Dr. Aline Brochure) in 2 weeks. Follow-up with PCP in 10 days. Avoid the use of NSAIDs.   Discharge instructions   Complete by: As directed    Take Medications are prescribed Follow heart healthy diet Maintain adequate hydration Arrange follow-up with PCP in 10 days. Follow-up with orthopedic service (Dr. Aline Brochure in 2 weeks)   Increase activity slowly   Complete by: As directed    Increase activity slowly   Complete by: As directed      Allergies as of 06/12/2019      Reactions   Amicinonide-benzyl Alcohol [cyclocort] Rash   Amoxicillin Rash      Doxycycline Rash   Paxil [paroxetine Hcl] Rash   Penicillins Rash      Sulfa Antibiotics Rash   Trimethoprim Rash      Medication List    TAKE  these medications   acetaminophen 325 MG tablet Commonly known as: TYLENOL Take 2 tablets (650 mg total) by mouth every 6 (six) hours as needed for mild pain (or Fever >/= 101).   buPROPion 150 MG 12 hr tablet Commonly known as: WELLBUTRIN SR Take 150 mg by mouth at bedtime.   ELDERBERRY PO Take 50 mg by mouth in the morning.   famotidine 20 MG tablet Commonly known as: PEPCID Take 1 tablet (20 mg total) by mouth daily. Start taking on: Jun 13, 2019   gabapentin 300 MG capsule Commonly known as: NEURONTIN Take 300 mg by mouth 3 (three) times daily.   hydrocortisone 2.5 % rectal cream Commonly known as: ANUSOL-HC Place 1 application rectally 2 (two) times daily. What changed:   when to take this  reasons to take this   ipratropium-albuterol 0.5-2.5 (3) MG/3ML Soln Commonly known as: DUONEB Inhale 3 mLs into the lungs every 6 (six) hours as needed for wheezing or shortness of breath.   linaclotide 290 MCG Caps capsule Commonly known as: Linzess Take 1 capsule (290 mcg total) by mouth daily before breakfast.   multivitamin with minerals Tabs tablet Take 1 tablet by mouth daily.   oxyCODONE 5 MG immediate release tablet Commonly known as: Oxy IR/ROXICODONE Take 1 tablet (5 mg total) by mouth every 6 (six) hours as needed for severe pain. What changed: when to take this   pravastatin 20 MG tablet Commonly known as: PRAVACHOL Take 20  mg by mouth in the morning.   ProAir HFA 108 (90 Base) MCG/ACT inhaler Generic drug: albuterol Inhale 1 puff into the lungs every 4 (four) hours as needed for wheezing or shortness of breath.   QUEtiapine 400 MG tablet Commonly known as: SEROQUEL Take 1 tablet (400 mg total) by mouth at bedtime. What changed: how much to take   Trelegy Ellipta 100-62.5-25 MCG/INH Aepb Generic drug: Fluticasone-Umeclidin-Vilant Inhale 1 puff into the lungs daily.   venlafaxine XR 75 MG 24 hr capsule Commonly known as: EFFEXOR-XR Take 225 mg by  mouth daily with breakfast.   vitamin B-12 500 MCG tablet Commonly known as: CYANOCOBALAMIN Take 500 mcg by mouth daily.   VITAMIN C PO Take 500 mg by mouth in the morning.   Vitamin D3 50 MCG (2000 UT) Tabs Take 2,000 Units by mouth in the morning.      Allergies  Allergen Reactions  . Amicinonide-Benzyl Alcohol [Cyclocort] Rash  . Amoxicillin Rash       . Doxycycline Rash  . Paxil [Paroxetine Hcl] Rash  . Penicillins Rash       . Sulfa Antibiotics Rash  . Trimethoprim Rash   Follow-up Information    Lucia Gaskins, MD. Schedule an appointment as soon as possible for a visit in 10 day(s).   Specialty: Internal Medicine Contact information: [place] [address] [phone]        Carole Civil, MD. Schedule an appointment as soon as possible for a visit in 2 week(s).   Specialties: Orthopedic Surgery, Radiology Contact information: [address] [place] [address] [phone]            The results of significant diagnostics from this hospitalization (including imaging, microbiology, ancillary and laboratory) are listed below for reference.    Significant Diagnostic Studies: DG Thoracic Spine 2 View  Result Date: 06/10/2019 CLINICAL DATA:  Recent syncopal episode with fall and back pain, initial encounter EXAM: THORACIC SPINE 2 VIEWS COMPARISON:  01/31/2017 FINDINGS: T8 compression deformity is noted and stable from the prior exam consistent with a chronic etiology. No acute compression is noted. Mild osteophytic changes are seen. IMPRESSION: Chronic T8 compression deformity.  Mild degenerative changes seen. Electronically Signed   By: Inez Catalina M.D.   On: 06/10/2019 15:05   DG Lumbar Spine Complete  Result Date: 06/10/2019 CLINICAL DATA:  Recent syncopal episode with back pain, initial encounter EXAM: LUMBAR SPINE - COMPLETE 4+ VIEW COMPARISON:  05/31/2017 FINDINGS: Five lumbar type vertebral bodies are well  visualized. Vertebral body height is well maintained. Disc space narrowing is noted at L4-5. Mild osteophytic changes are seen. No pars defects are noted. Retrolisthesis of L4 with respect L5 is noted of a degenerative nature. The overall appearance is stable from the prior exam. No soft tissue abnormality is noted. IMPRESSION: Degenerative change as described without acute abnormality. Electronically Signed   By: Inez Catalina M.D.   On: 06/10/2019 15:01   DG Ankle Complete Left  Result Date: 06/10/2019 CLINICAL DATA:  Left ankle pain and swelling following a fall yesterday. EXAM: LEFT ANKLE COMPLETE - 3+ VIEW COMPARISON:  Report dated 07/30/2003. FINDINGS: Mild soft tissue swelling anteriorly and laterally. Small curvilinear calcific densities between the lateral malleolus and talus. Small effusion. IMPRESSION: 1. Small avulsion fracture fragments between the lateral malleolus and talus. 2. Small effusion. Electronically Signed   By: Claudie Revering M.D.   On: 06/10/2019 17:06   US Renal  Result Date: 06/10/2019 CLINICAL  DATA:  Renal insufficiency. EXAM: RENAL / URINARY TRACT ULTRASOUND COMPLETE COMPARISON:  08/10/2018 FINDINGS: Right Kidney: Renal measurements: 11.7 x 6.2 x 4.5 cm = volume: 170 mL . Echogenicity within normal limits. No mass or hydronephrosis visualized. Left Kidney: Renal measurements: 11.7 x 5.8 x 5.6 cm = volume: 198 mL. Echogenicity within normal limits. No mass or hydronephrosis visualized. Bladder: Appears normal for degree of bladder distention. Other: None. IMPRESSION: Normal examination, unchanged. Electronically Signed   By: Claudie Revering M.D.   On: 06/10/2019 17:42   DG Chest Portable 1 View  Result Date: 06/10/2019 CLINICAL DATA:  Syncope.  Hypotension.  Chest pain. EXAM: PORTABLE CHEST 1 VIEW COMPARISON:  10/07/2018 FINDINGS: The heart size and mediastinal contours are within normal limits. No pleural effusion or edema. Diffusely coarsened interstitial markings are identified  bilaterally. No superimposed airspace consolidation. The visualized skeletal structures are unremarkable. IMPRESSION: 1. Chronic interstitial coarsening. 2. No acute cardiopulmonary abnormalities. Electronically Signed   By: Kerby Moors M.D.   On: 06/10/2019 14:32   DG Hip Unilat W or Wo Pelvis 2-3 Views Left  Result Date: 06/10/2019 CLINICAL DATA:  Recent syncopal episode with fall and left hip pain, initial encounter EXAM: DG HIP (WITH OR WITHOUT PELVIS) 3V LEFT COMPARISON:  05/31/2017 FINDINGS: There is no evidence of hip fracture or dislocation. There is no evidence of arthropathy or other focal bone abnormality. IMPRESSION: No acute abnormality noted. Electronically Signed   By: Inez Catalina M.D.   On: 06/10/2019 15:08    Microbiology: Recent Results (from the past 240 hour(s))  SARS Coronavirus 2 by RT PCR (hospital order, performed in [facility] hospital lab) Nasopharyngeal Nasopharyngeal Swab     Status: None   Collection Time: 06/10/19  5:51 PM   Specimen: Nasopharyngeal Swab  Result Value Ref Range Status   SARS Coronavirus 2 NEGATIVE NEGATIVE Final    Comment: (NOTE) SARS-CoV-2 target nucleic acids are NOT DETECTED. The SARS-CoV-2 RNA is generally detectable in upper and lower respiratory specimens during the acute phase of infection. The lowest concentration of SARS-CoV-2 viral copies this assay can detect is 250 copies / mL. A negative result does not preclude SARS-CoV-2 infection and should not be used as the sole basis for treatment or other patient management decisions.  A negative result may occur with improper specimen collection / handling, submission of specimen other than nasopharyngeal swab, presence of viral mutation(s) within the areas targeted by this assay, and inadequate number of viral copies (<250 copies / mL). A negative result must be combined with clinical observations, patient history, and epidemiological information. Fact Sheet for Patients:    StrictlyIdeas.no Fact Sheet for Healthcare Providers: BankingDealers.co.za This test is not yet approved or cleared  by the [place] FDA and has been authorized for detection and/or diagnosis of SARS-CoV-2 by FDA under an Emergency Use Authorization (EUA).  This EUA will remain in effect (meaning this test can be used) for the duration of the COVID-19 declaration under Section 564(b)(1) of the Act, 21 U.S.C. section 360bbb-3(b)(1), unless the authorization is terminated or revoked sooner. Performed at [facility], [address], [place], [place] [address]   MRSA PCR Screening     Status: None   Collection Time: 06/11/19  1:17 AM   Specimen: Nasal Mucosa; Nasopharyngeal  Result Value Ref Range Status   MRSA by PCR NEGATIVE NEGATIVE Final    Comment:        The GeneXpert MRSA Assay (FDA approved for NASAL specimens only), is  one component of a comprehensive MRSA colonization surveillance program. It is not intended to diagnose MRSA infection nor to guide or monitor treatment for MRSA infections. Performed at [facility], [address] [place], [place] [address]      Labs: Basic Metabolic Panel: Recent Labs  Lab 06/10/19 1403 06/11/19 0449 06/12/19 0616  NA 139 141 142  K 4.2 4.7 5.0  CL 104 111 112*  CO2 24 23 25   GLUCOSE 101* 85 92  BUN 72* 48* 26*  CREATININE 6.46* 2.94* 1.20*  CALCIUM 9.0 9.0 9.2   Liver Function Tests: Recent Labs  Lab 06/10/19 1403 06/11/19 0449  AST 32 32  ALT 31 28  ALKPHOS 79 73  BILITOT 0.7 0.5  PROT 6.8 6.1*  ALBUMIN 3.9 3.4*   CBC: Recent Labs  Lab 06/10/19 1403 06/11/19 0449  WBC 7.1 7.4  NEUTROABS 5.3  --   HGB 10.4* 10.0*  HCT 32.0* 31.6*  MCV 98.5 100.0  PLT 182 183   Cardiac Enzymes: Recent Labs  Lab 06/10/19 1601  CKTOTAL 444*    Signed:  Barton Dubois MD.  [facility] 06/12/2019, 2:59 PM

## 2019-06-15 ENCOUNTER — Ambulatory Visit: Payer: Medicare Other | Admitting: Gastroenterology

## 2019-06-15 NOTE — Progress Notes (Deleted)
Primary Care Physician: Lucia Gaskins, MD  Primary Gastroenterologist:  Garfield Cornea, MD   No chief complaint on file.   HPI: Tanya Velazquez is a 59 y.o. female here for follow-up.  She has a history of symptomatic hemorrhoids.  Completed a colonoscopy in March 2021 and noted to have grade 3 hemorrhoids, scattered diverticula, 3 sessile polyps which were benign and hyperplastic.  Constipation well managed on Linzess 290 mcg daily.  She completed her initial hemorrhoid banding on May 7 of the left lateral internal hemorrhoid.  She was seen in follow-up on May 27 for potential second hemorrhoid banding but due to non-GI issues this was delayed.  She was sent to her PCP for hypotension.     Current Outpatient Medications  Medication Sig Dispense Refill  . acetaminophen (TYLENOL) 325 MG tablet Take 2 tablets (650 mg total) by mouth every 6 (six) hours as needed for mild pain (or Fever >/= 101). 12 tablet 0  . Ascorbic Acid (VITAMIN C PO) Take 500 mg by mouth in the morning.     Marland Kitchen buPROPion (WELLBUTRIN SR) 150 MG 12 hr tablet Take 150 mg by mouth at bedtime.     . Cholecalciferol (VITAMIN D3) 50 MCG (2000 UT) TABS Take 2,000 Units by mouth in the morning.     Marland Kitchen ELDERBERRY PO Take 50 mg by mouth in the morning.     . famotidine (PEPCID) 20 MG tablet Take 1 tablet (20 mg total) by mouth daily. 30 tablet 1  . gabapentin (NEURONTIN) 300 MG capsule Take 300 mg by mouth 3 (three) times daily.     . hydrocortisone (ANUSOL-HC) 2.5 % rectal cream Place 1 application rectally 2 (two) times daily. (Patient taking differently: Place 1 application rectally 2 (two) times daily as needed for hemorrhoids (discomfort). ) 30 g 1  . ipratropium-albuterol (DUONEB) 0.5-2.5 (3) MG/3ML SOLN Inhale 3 mLs into the lungs every 6 (six) hours as needed for wheezing or shortness of breath.    . linaclotide (LINZESS) 290 MCG CAPS capsule Take 1 capsule (290 mcg total) by mouth daily before breakfast. 90 capsule  30  . Multiple Vitamin (MULTIVITAMIN WITH MINERALS) TABS tablet Take 1 tablet by mouth daily.    Marland Kitchen oxyCODONE (OXY IR/ROXICODONE) 5 MG immediate release tablet Take 1 tablet (5 mg total) by mouth every 6 (six) hours as needed for severe pain. (Patient taking differently: Take 5 mg by mouth in the morning, at noon, in the evening, and at bedtime. )    . pravastatin (PRAVACHOL) 20 MG tablet Take 20 mg by mouth in the morning.     Marland Kitchen PROAIR HFA 108 (90 BASE) MCG/ACT inhaler Inhale 1 puff into the lungs every 4 (four) hours as needed for wheezing or shortness of breath.     . QUEtiapine (SEROQUEL) 400 MG tablet Take 1 tablet (400 mg total) by mouth at bedtime.    . TRELEGY ELLIPTA 100-62.5-25 MCG/INH AEPB Inhale 1 puff into the lungs daily.    Marland Kitchen venlafaxine XR (EFFEXOR-XR) 75 MG 24 hr capsule Take 225 mg by mouth daily with breakfast.     . vitamin B-12 (CYANOCOBALAMIN) 500 MCG tablet Take 500 mcg by mouth daily.     No current facility-administered medications for this visit.    Allergies as of 06/15/2019 - Review Complete 06/10/2019  Allergen Reaction Noted  . Amicinonide-benzyl alcohol [cyclocort] Rash 06/20/2010  . Amoxicillin Rash 10/01/2011  . Doxycycline Rash 06/20/2010  . Paxil [paroxetine hcl]  Rash 05/29/2016  . Penicillins Rash 03/10/2011  . Sulfa antibiotics Rash 04/10/2014  . Trimethoprim Rash 06/20/2010    ROS:  General: Negative for anorexia, weight loss, fever, chills, fatigue, weakness. ENT: Negative for hoarseness, difficulty swallowing , nasal congestion. CV: Negative for chest pain, angina, palpitations, dyspnea on exertion, peripheral edema.  Respiratory: Negative for dyspnea at rest, dyspnea on exertion, cough, sputum, wheezing.  GI: See history of present illness. GU:  Negative for dysuria, hematuria, urinary incontinence, urinary frequency, nocturnal urination.  Endo: Negative for unusual weight change.    Physical Examination:   There were no vitals taken for  this visit.  General: Well-nourished, well-developed in no acute distress.  Eyes: No icterus. Mouth: Oropharyngeal mucosa moist and pink , no lesions erythema or exudate. Lungs: Clear to auscultation bilaterally.  Heart: Regular rate and rhythm, no murmurs rubs or gallops.  Abdomen: Bowel sounds are normal, nontender, nondistended, no hepatosplenomegaly or masses, no abdominal bruits or hernia , no rebound or guarding.   Extremities: No lower extremity edema. No clubbing or deformities. Neuro: Alert and oriented x 4   Skin: Warm and dry, no jaundice.   Psych: Alert and cooperative, normal mood and affect.  Labs:  ***  Imaging Studies: DG Thoracic Spine 2 View  Result Date: 06/10/2019 CLINICAL DATA:  Recent syncopal episode with fall and back pain, initial encounter EXAM: THORACIC SPINE 2 VIEWS COMPARISON:  01/31/2017 FINDINGS: T8 compression deformity is noted and stable from the prior exam consistent with a chronic etiology. No acute compression is noted. Mild osteophytic changes are seen. IMPRESSION: Chronic T8 compression deformity.  Mild degenerative changes seen. Electronically Signed   By: Inez Catalina M.D.   On: 06/10/2019 15:05   DG Lumbar Spine Complete  Result Date: 06/10/2019 CLINICAL DATA:  Recent syncopal episode with back pain, initial encounter EXAM: LUMBAR SPINE - COMPLETE 4+ VIEW COMPARISON:  05/31/2017 FINDINGS: Five lumbar type vertebral bodies are well visualized. Vertebral body height is well maintained. Disc space narrowing is noted at L4-5. Mild osteophytic changes are seen. No pars defects are noted. Retrolisthesis of L4 with respect L5 is noted of a degenerative nature. The overall appearance is stable from the prior exam. No soft tissue abnormality is noted. IMPRESSION: Degenerative change as described without acute abnormality. Electronically Signed   By: Inez Catalina M.D.   On: 06/10/2019 15:01   DG Ankle Complete Left  Result Date: 06/10/2019 CLINICAL DATA:   Left ankle pain and swelling following a fall yesterday. EXAM: LEFT ANKLE COMPLETE - 3+ VIEW COMPARISON:  Report dated 07/30/2003. FINDINGS: Mild soft tissue swelling anteriorly and laterally. Small curvilinear calcific densities between the lateral malleolus and talus. Small effusion. IMPRESSION: 1. Small avulsion fracture fragments between the lateral malleolus and talus. 2. Small effusion. Electronically Signed   By: Claudie Revering M.D.   On: 06/10/2019 17:06   US Renal  Result Date: 06/10/2019 CLINICAL DATA:  Renal insufficiency. EXAM: RENAL / URINARY TRACT ULTRASOUND COMPLETE COMPARISON:  08/10/2018 FINDINGS: Right Kidney: Renal measurements: 11.7 x 6.2 x 4.5 cm = volume: 170 mL . Echogenicity within normal limits. No mass or hydronephrosis visualized. Left Kidney: Renal measurements: 11.7 x 5.8 x 5.6 cm = volume: 198 mL. Echogenicity within normal limits. No mass or hydronephrosis visualized. Bladder: Appears normal for degree of bladder distention. Other: None. IMPRESSION: Normal examination, unchanged. Electronically Signed   By: Claudie Revering M.D.   On: 06/10/2019 17:42   DG Chest Portable 1 View  Result Date: 06/10/2019  CLINICAL DATA:  Syncope.  Hypotension.  Chest pain. EXAM: PORTABLE CHEST 1 VIEW COMPARISON:  10/07/2018 FINDINGS: The heart size and mediastinal contours are within normal limits. No pleural effusion or edema. Diffusely coarsened interstitial markings are identified bilaterally. No superimposed airspace consolidation. The visualized skeletal structures are unremarkable. IMPRESSION: 1. Chronic interstitial coarsening. 2. No acute cardiopulmonary abnormalities. Electronically Signed   By: Kerby Moors M.D.   On: 06/10/2019 14:32   DG Hip Unilat W or Wo Pelvis 2-3 Views Left  Result Date: 06/10/2019 CLINICAL DATA:  Recent syncopal episode with fall and left hip pain, initial encounter EXAM: DG HIP (WITH OR WITHOUT PELVIS) 3V LEFT COMPARISON:  05/31/2017 FINDINGS: There is no  evidence of hip fracture or dislocation. There is no evidence of arthropathy or other focal bone abnormality. IMPRESSION: No acute abnormality noted. Electronically Signed   By: Inez Catalina M.D.   On: 06/10/2019 15:08

## 2019-06-23 ENCOUNTER — Encounter: Payer: Self-pay | Admitting: Gastroenterology

## 2019-06-23 ENCOUNTER — Other Ambulatory Visit: Payer: Self-pay

## 2019-06-23 ENCOUNTER — Ambulatory Visit (INDEPENDENT_AMBULATORY_CARE_PROVIDER_SITE_OTHER): Payer: Medicare Other | Admitting: Gastroenterology

## 2019-06-23 VITALS — BP 102/66 | HR 96 | Temp 97.1°F | Ht 62.0 in | Wt 147.6 lb

## 2019-06-23 DIAGNOSIS — K642 Third degree hemorrhoids: Secondary | ICD-10-CM | POA: Diagnosis not present

## 2019-06-23 NOTE — Patient Instructions (Signed)
Continue to avoid straining, stay regular with bowel movements, and limit toilet time.  We will see you in several weeks for a third banding!  I enjoyed seeing you again today! As you know, I value our relationship and want to provide genuine, compassionate, and quality care. I welcome your feedback. If you receive a survey regarding your visit,  I greatly appreciate you taking time to fill this out. See you next time!  Annitta Needs, PhD, ANP-BC [facility]

## 2019-06-23 NOTE — Progress Notes (Signed)
[facility] Banding Note:   59 year old female with history of symptomatic hemorrhoids, s/p first banding early May 2021 of left lateral column. At last visit, she was hypotensive and sent for further evaluation with PCP. She was then admitted with acute renal failure 5/28 and discharged 5/30 after fluid resuscitation and return to normal creatinine. Here to discuss second banding. She is back to baseline and doing well. No longer on lisinopril. Itching persists but overall improved with symptoms from first banding.   The patient presents with symptomatic grade 3 hemorrhoids, unresponsive to maximal medical therapy, requesting rubber band ligation of her hemorrhoidal disease. All risks, benefits, and alternative forms of therapy were described and informed consent was obtained.  The decision was made to band the right anterior internal hemorrhoid, and the [place] was used to perform band ligation without complication. Digital anorectal examination was then performed to assure proper positioning of the band, and to adjust the banded tissue as required. The patient was discharged home without pain or other issues. Dietary and behavioral recommendations were given along with follow-up instructions. The patient will return in 2-3 weeks for followup and possible additional banding as required. Will focus on right posterior at next visit likely.   No complications were encountered and the patient tolerated the procedure well.  Annitta Needs, PhD, ANP-BC [facility]

## 2019-06-27 ENCOUNTER — Other Ambulatory Visit: Payer: Self-pay

## 2019-06-27 ENCOUNTER — Ambulatory Visit (INDEPENDENT_AMBULATORY_CARE_PROVIDER_SITE_OTHER): Payer: Medicare Other | Admitting: Orthopedic Surgery

## 2019-06-27 ENCOUNTER — Encounter: Payer: Self-pay | Admitting: Orthopedic Surgery

## 2019-06-27 VITALS — BP 127/85 | HR 97 | Ht 62.0 in | Wt 147.0 lb

## 2019-06-27 DIAGNOSIS — S8262XA Displaced fracture of lateral malleolus of left fibula, initial encounter for closed fracture: Secondary | ICD-10-CM

## 2019-06-27 DIAGNOSIS — W19XXXA Unspecified fall, initial encounter: Secondary | ICD-10-CM

## 2019-06-27 NOTE — Progress Notes (Signed)
NEW PROBLEM//OFFICE VISIT  Chief Complaint  Patient presents with  . Foot Injury    fall 06/10/19 has left ankle fracture     59 year old female fell during a syncopal episode injured her left ankle has some avulsion fractures medial to the fibula but lateral to the tibia  She was placed in a cam walking boot but requests something smaller  Lateral ankle pain swelling chief complaint she is able to ambulate in the walker   Review of Systems  Neurological: Negative for tingling.     Past Medical History:  Diagnosis Date  . Anxiety   . B12 deficiency anemia   . Chronic constipation   . Chronic constipation   . Chronic low back pain    lumbar radiculopathy  . COPD (chronic obstructive pulmonary disease) ([place])   . Diabetes mellitus   . DJD (degenerative joint disease)   . GERD (gastroesophageal reflux disease) 04/13/07   EGD Dr Rourk->patulous EG junction, HH, antral erosions  . H/O hiatal hernia   . Hyperplastic colon polyp 06/27/2010  . Hypertension   . Migraines   . Osteoarthritis   . Raspy voice   . Smokers' cough ([place])   . SUI (stress urinary incontinence, female)     Past Surgical History:  Procedure Laterality Date  . APPENDECTOMY    . BLADDER SUSPENSION  10/07/2011   Procedure: [facility] PROCEDURE;  Surgeon: Malka So, MD;  Location: [facility];  Service: Urology;  Laterality: N/A;  1 hour requested for this case   . CARDIAC CATHETERIZATION  08-09-2004 DR Einar Gip   NORMAL CORONARY ARTERIES/  NORMAL LVF  . CARDIAC CATHETERIZATION  2006   normal coronary arteries  . COLONOSCOPY  3/31/9   Dr Rourk->friable anal canal  . COLONOSCOPY  0 06/27/10   Dr. Gala Romney hyperplastic polyp, prominent external hemorrhoid plexus likely source of hematochezia, long tortuous colon. next tcs 06/2015.  Marland Kitchen COLONOSCOPY WITH PROPOFOL N/A 04/07/2019   Procedure: COLONOSCOPY WITH PROPOFOL;  Surgeon: Daneil Dolin, MD;  [facility];  Service: Endoscopy;   Laterality: N/A;  2:00pm - spoke w/pt, she knows to arrive at 11:30, she could not take the AM slot due to transportation  . CYSTOSCOPY  10/07/2011   Procedure: CYSTOSCOPY;  Surgeon: Malka So, MD;  Location: [facility];  Service: Urology;  Laterality: N/A;  . ESOPHAGEAL MANOMETRY  2013   Dr. Derrill Kay at [facility]. incomplete bolus clearance with some breaks in contractions but there was peristalsis  . ESOPHAGOGASTRODUODENOSCOPY  06/27/2010   Schatzki ring s/p dilation, small hiatal hernia, couple tiny antral erosions.  Marland Kitchen EXCISION VOLAR GANGLION LEFT WRIST  06-27-2005  . HEAD-UP TILT TABLE TEST  01-21-2007 &  11-04-2004  DR Rollene Fare   HX CHRONIC RECURRENT SYNCOPAL. EPICODES--  NEGATIVE RESULT INCLUDING ISUPREL INFUSION  . HEMORRHOID SURGERY  09-13-2003  . LAPAROSCOPIC CHOLECYSTECTOMY  02-23-2007  . LAPAROTOMY W/ APPENDCTOMY AND LEFT SALPINGO-OOPHECTOMY  AGE 43'S  . pH/impedence  2013   Dr. Derrill Kay at [facility]. Increased reflux off PPI with excellent correlation between reflux events and regurgitation. Patient advised to hold PPI for study by Dr. Derrill Kay.   Marland Kitchen POLYPECTOMY  04/07/2019   Procedure: POLYPECTOMY;  Surgeon: Daneil Dolin, MD;  [facility];  Service: Endoscopy;;  . RIGHT SHOULDER ARTHROSCOPY/ DISTAL CLAVICLE RESECTION / [place] AND LABRUM TEAR/ BURSECTOMY  06-28-2010   IMPINGEMENT SYNDROME/ AC JOINT ARTHRITIS/ ROTATOR CUFF TENDINOPATHY  . SHOULDER ARTHROSCOPY  08-13-2010  LEFT SHOULDER IMPINGEMENT SYNDROME/ DJD OF AC JOINT  . STOMACH SURGERY  2013   antireflux surgery at [facility]  . VAGINAL HYSTERECTOMY  age 13's    Family History  Problem Relation Age of Onset  . Colon cancer Mother 62  . Hypertension Mother   . Heart disease Mother 57       CABG  . Alzheimer's disease Father   . Hypertension Father   . Healthy Daughter   . Heart failure Sister   . Heart Problems Sister        Cardiac stent   Social History   Tobacco Use  .  Smoking status: Current Some Day Smoker    Packs/day: 1.00    Years: 35.00    Pack years: 35.00    Types: Cigarettes  . Smokeless tobacco: Never Used  Vaping Use  . Vaping Use: Never used  Substance Use Topics  . Alcohol use: No    Alcohol/week: 0.0 standard drinks  . Drug use: No    Allergies  Allergen Reactions  . Amicinonide-Benzyl Alcohol [Cyclocort] Rash  . Amoxicillin Rash       . Doxycycline Rash  . Paxil [Paroxetine Hcl] Rash  . Penicillins Rash       . Sulfa Antibiotics Rash  . Trimethoprim Rash    Current Meds  Medication Sig  . acetaminophen (TYLENOL) 325 MG tablet Take 2 tablets (650 mg total) by mouth every 6 (six) hours as needed for mild pain (or Fever >/= 101).  . Ascorbic Acid (VITAMIN C PO) Take 500 mg by mouth in the morning.   Marland Kitchen buPROPion (WELLBUTRIN SR) 150 MG 12 hr tablet Take 150 mg by mouth at bedtime.   . Cholecalciferol (VITAMIN D3) 50 MCG (2000 UT) TABS Take 2,000 Units by mouth in the morning.   Marland Kitchen ELDERBERRY PO Take 50 mg by mouth in the morning.   . famotidine (PEPCID) 20 MG tablet Take 1 tablet (20 mg total) by mouth daily.  Marland Kitchen gabapentin (NEURONTIN) 300 MG capsule Take 300 mg by mouth 3 (three) times daily.   . hydrocortisone (ANUSOL-HC) 2.5 % rectal cream Place 1 application rectally 2 (two) times daily. (Patient taking differently: Place 1 application rectally 2 (two) times daily as needed for hemorrhoids (discomfort). )  . ipratropium-albuterol (DUONEB) 0.5-2.5 (3) MG/3ML SOLN Inhale 3 mLs into the lungs every 6 (six) hours as needed for wheezing or shortness of breath.  . linaclotide (LINZESS) 290 MCG CAPS capsule Take 1 capsule (290 mcg total) by mouth daily before breakfast.  . Multiple Vitamin (MULTIVITAMIN WITH MINERALS) TABS tablet Take 1 tablet by mouth daily.  Marland Kitchen oxyCODONE (OXY IR/ROXICODONE) 5 MG immediate release tablet Take 1 tablet (5 mg total) by mouth every 6 (six) hours as needed for severe pain. (Patient taking differently:  Take 5 mg by mouth in the morning, at noon, in the evening, and at bedtime. )  . pravastatin (PRAVACHOL) 20 MG tablet Take 20 mg by mouth in the morning.   Marland Kitchen PROAIR HFA 108 (90 BASE) MCG/ACT inhaler Inhale 1 puff into the lungs every 4 (four) hours as needed for wheezing or shortness of breath.   . QUEtiapine (SEROQUEL) 400 MG tablet Take 1 tablet (400 mg total) by mouth at bedtime.  . TRELEGY ELLIPTA 100-62.5-25 MCG/INH AEPB Inhale 1 puff into the lungs daily.  Marland Kitchen venlafaxine XR (EFFEXOR-XR) 75 MG 24 hr capsule Take 225 mg by mouth daily with breakfast.   . vitamin B-12 (CYANOCOBALAMIN) 500 MCG tablet  Take 500 mcg by mouth daily.    BP 127/85   Pulse 97   Ht 5\' 2"  (1.575 m)   Wt 147 lb (66.7 kg)   BMI 26.89 kg/m   Physical Exam Vitals and nursing note reviewed.  Constitutional:      Appearance: Normal appearance.  Neurological:     Mental Status: She is alert and oriented to person, place, and time.  Psychiatric:        Mood and Affect: Mood normal.     Left Ankle Exam   Tenderness  The patient is experiencing tenderness in the ATF.  Swelling: mild  Range of Motion  The patient has normal left ankle ROM.   Muscle Strength  The patient has normal left ankle strength.  Tests  Anterior drawer: negative  Other  Erythema: absent Scars: absent Sensation: normal Pulse: present        MEDICAL DECISION MAKING  A.  Encounter Diagnosis  Name Primary?  . Closed avulsion fracture of lateral malleolus of left fibula, initial encounter Yes    B. DATA ANALYSED:  Hospital records indicate syncope no major findings patient was released home  IMAGING: Independent interpretation of images: X-rays were reviewed and they show a avulsion fracture medial to the fibula and lateral to the talus the ankle mortise was intact  Orders:   Outside records reviewed: Yes  C. MANAGEMENT  Weight-bear as tolerated convert to a hard soled shoe reevaluate end of the month  No  orders of the defined types were placed in this encounter.   (Acute uncomplicated injury independent interpretation of x-ray low risk of morbidity from treatment  Arther Abbott, MD  06/27/2019 12:14 PM

## 2019-07-13 ENCOUNTER — Ambulatory Visit: Payer: Medicare Other | Admitting: Orthopedic Surgery

## 2019-07-14 ENCOUNTER — Telehealth: Payer: Self-pay | Admitting: Orthopedic Surgery

## 2019-07-14 NOTE — Telephone Encounter (Signed)
I told her to come in let me look at it see what else we can do She will come by tomorrow am, I told her she may have to wait, Friday mornings are sometimes busy

## 2019-07-14 NOTE — Telephone Encounter (Signed)
Patient states that the shoe that Dr Aline Brochure gave her, the sole is coming out of it and is requesting another shoe.

## 2019-07-26 ENCOUNTER — Ambulatory Visit: Payer: Medicare Other | Admitting: Gastroenterology

## 2019-07-27 ENCOUNTER — Other Ambulatory Visit: Payer: Self-pay

## 2019-07-27 ENCOUNTER — Ambulatory Visit (INDEPENDENT_AMBULATORY_CARE_PROVIDER_SITE_OTHER): Payer: Medicare Other | Admitting: Orthopedic Surgery

## 2019-07-27 ENCOUNTER — Encounter: Payer: Self-pay | Admitting: Orthopedic Surgery

## 2019-07-27 VITALS — BP 113/70 | HR 76 | Ht 62.0 in | Wt 149.5 lb

## 2019-07-27 DIAGNOSIS — S8262XD Displaced fracture of lateral malleolus of left fibula, subsequent encounter for closed fracture with routine healing: Secondary | ICD-10-CM | POA: Diagnosis not present

## 2019-07-27 NOTE — Patient Instructions (Addendum)
30 tip toes daily   Smoking Tobacco Information, Adult Smoking tobacco can be harmful to your health. Tobacco contains a poisonous (toxic), colorless chemical called nicotine. Nicotine is addictive. It changes the brain and can make it hard to stop smoking. Tobacco also has other toxic chemicals that can hurt your body and raise your risk of many cancers. How can smoking tobacco affect me? Smoking tobacco puts you at risk for:  Cancer. Smoking is most commonly associated with lung cancer, but can also lead to cancer in other parts of the body.  Chronic obstructive pulmonary disease (COPD). This is a long-term lung condition that makes it hard to breathe. It also gets worse over time.  High blood pressure (hypertension), heart disease, stroke, or heart attack.  Lung infections, such as pneumonia.  Cataracts. This is when the lenses in the eyes become clouded.  Digestive problems. This may include peptic ulcers, heartburn, and gastroesophageal reflux disease (GERD).  Oral health problems, such as gum disease and tooth loss.  Loss of taste and smell. Smoking can affect your appearance by causing:  Wrinkles.  Yellow or stained teeth, fingers, and fingernails. Smoking tobacco can also affect your social life, because:  It may be challenging to find places to smoke when away from home. Many workplaces, [employer], hotels, and public places are tobacco-free.  Smoking is expensive. This is due to the cost of tobacco and the long-term costs of treating health problems from smoking.  Secondhand smoke may affect those around you. Secondhand smoke can cause lung cancer, breathing problems, and heart disease. Children of smokers have a higher risk for: ? Sudden infant death syndrome (SIDS). ? Ear infections. ? Lung infections. If you currently smoke tobacco, quitting now can help you:  Lead a longer and healthier life.  Look, smell, breathe, and feel better over time.  Save  money.  Protect others from the harms of secondhand smoke. What actions can I take to prevent health problems? Quit smoking   Do not start smoking. Quit if you already do.  Make a plan to quit smoking and commit to it. Look for programs to help you and ask your health care provider for recommendations and ideas.  Set a date and write down all the reasons you want to quit.  Let your friends and family know you are quitting so they can help and support you. Consider finding friends who also want to quit. It can be easier to quit with someone else, so that you can support each other.  Talk with your health care provider about using nicotine replacement medicines to help you quit, such as gum, lozenges, patches, sprays, or pills.  Do not replace cigarette smoking with electronic cigarettes, which are commonly called e-cigarettes. The safety of e-cigarettes is not known, and some may contain harmful chemicals.  If you try to quit but return to smoking, stay positive. It is common to slip up when you first quit, so take it one day at a time.  Be prepared for cravings. When you feel the urge to smoke, chew gum or suck on hard candy. Lifestyle  Stay busy and take care of your body.  Drink enough fluid to keep your urine pale yellow.  Get plenty of exercise and eat a healthy diet. This can help prevent weight gain after quitting.  Monitor your eating habits. Quitting smoking can cause you to have a larger appetite than when you smoke.  Find ways to relax. Go out with friends or family  to a movie or a restaurant where people do not smoke.  Ask your health care provider about having regular tests (screenings) to check for cancer. This may include blood tests, imaging tests, and other tests.  Find ways to manage your stress, such as meditation, yoga, or exercise. Where to find support To get support to quit smoking, consider:  Asking your health care provider for more information and  resources.  Taking classes to learn more about quitting smoking.  Looking for local organizations that offer resources about quitting smoking.  Joining a support group for people who want to quit smoking in your local community.  Calling the smokefree.gov counselor helpline: 1-800-Quit-Now (978)429-2419) Where to find more information You may find more information about quitting smoking from:  HelpGuide.org: www.helpguide.org  https://hall.com/: smokefree.gov  American Lung Association: www.lung.org Contact a health care provider if you:  Have problems breathing.  Notice that your lips, nose, or fingers turn blue.  Have chest pain.  Are coughing up blood.  Feel faint or you pass out.  Have other health changes that cause you to worry. Summary  Smoking tobacco can negatively affect your health, the health of those around you, your finances, and your social life.  Do not start smoking. Quit if you already do. If you need help quitting, ask your health care provider.  Think about joining a support group for people who want to quit smoking in your local community. There are many effective programs that will help you to quit this behavior. This information is not intended to replace advice given to you by your health care provider. Make sure you discuss any questions you have with your health care provider. Document Revised: 09/24/2018 Document Reviewed: 01/15/2016 Elsevier Patient Education  2020 [employer].

## 2019-07-27 NOTE — Progress Notes (Signed)
Chief Complaint  Patient presents with  . Ankle Pain    L/ still hurting and swelling other than that its doing ok   Tanya Velazquez had an avulsion fracture of the lateral malleolus essentially a lateral ankle sprain.  She still having some lateral pain and swelling  The area is tender there is some swelling of the anterior talofibular ligament the drawer test is stable she had difficulty with single-leg heel rise but did better with double leg heel rise  Recommend home exercises expect resolution follow-up in 4 weeks  Encounter Diagnosis  Name Primary?  . Closed avulsion fracture of lateral malleolus of left fibula with routine healing, subsequent encounter Yes

## 2019-08-11 ENCOUNTER — Ambulatory Visit: Payer: Medicare Other | Admitting: Gastroenterology

## 2019-08-11 ENCOUNTER — Other Ambulatory Visit: Payer: Self-pay

## 2019-08-11 ENCOUNTER — Encounter: Payer: Self-pay | Admitting: Gastroenterology

## 2019-08-11 ENCOUNTER — Ambulatory Visit (INDEPENDENT_AMBULATORY_CARE_PROVIDER_SITE_OTHER): Payer: Medicare Other | Admitting: Gastroenterology

## 2019-08-11 VITALS — BP 116/67 | HR 70 | Temp 96.6°F | Ht 62.0 in | Wt 147.2 lb

## 2019-08-11 DIAGNOSIS — K642 Third degree hemorrhoids: Secondary | ICD-10-CM

## 2019-08-11 MED ORDER — LINACLOTIDE 290 MCG PO CAPS: 290.0000 ug | ORAL_CAPSULE | Freq: Every day | ORAL | 30 refills | Status: DC

## 2019-08-11 NOTE — Progress Notes (Signed)
[facility] Banding Note:   59 year old female with history of symptomatic hemorrhoids, s/p first banding early May 2021 left lateral, June 2021 right anterior. BM once per week as she ran out of [place]. Velazquez refills. Linzess was helping previously. Prolapsing is only symptom at this time. Noted improvement from prior bandings.   The patient presents with symptomatic grade 3 hemorrhoids, unresponsive to maximal medical therapy, requesting rubber band ligation of her hemorrhoidal disease. All risks, benefits, and alternative forms of therapy were described and informed consent was obtained.  The decision was made to band the right posterior internal hemorrhoid, and the [place] was used to perform band ligation without complication. Significant amount of stool in rectal vault.  Digital anorectal examination was then performed to assure proper positioning of the band, and to adjust the banded tissue as required. The patient was discharged home without pain or other issues. Dietary and behavioral recommendations were given and (if necessary prescriptions were given), along with follow-up instructions. The patient will return in 2-3 weeks for followup and likely additional banding in neutral position. She does have notable external hemorrhoid tags but asymptomatic.  No complications were encountered and the patient tolerated the procedure well.  Tanya Needs, PhD, ANP-BC [facility]

## 2019-08-11 NOTE — Patient Instructions (Signed)
Please start Linzess one capsule, 30 minutes before breakfast. I sent refills to the pharmacy. Please call if any issues getting this picked up or running out of refills. We want to avoid constipation, limit toilet time, and avoid straining.  I will see you in 2-3 weeks for likely additional banding!   I enjoyed seeing you again today! As you know, I value our relationship and want to provide genuine, compassionate, and quality care. I welcome your feedback. If you receive a survey regarding your visit,  I greatly appreciate you taking time to fill this out. See you next time!  Annitta Needs, PhD, ANP-BC [facility]

## 2019-08-24 ENCOUNTER — Encounter: Payer: Self-pay | Admitting: Orthopedic Surgery

## 2019-08-24 ENCOUNTER — Ambulatory Visit (INDEPENDENT_AMBULATORY_CARE_PROVIDER_SITE_OTHER): Payer: Medicare Other | Admitting: Orthopedic Surgery

## 2019-08-24 ENCOUNTER — Other Ambulatory Visit: Payer: Self-pay

## 2019-08-24 VITALS — BP 120/70 | HR 72 | Ht 62.0 in | Wt 146.0 lb

## 2019-08-24 DIAGNOSIS — S8262XD Displaced fracture of lateral malleolus of left fibula, subsequent encounter for closed fracture with routine healing: Secondary | ICD-10-CM

## 2019-08-24 MED ORDER — PREDNISONE 10 MG (48) PO TBPK: ORAL_TABLET | Freq: Every day | ORAL | 0 refills | Status: DC

## 2019-08-24 NOTE — Progress Notes (Signed)
Chief Complaint  Patient presents with  . Follow-up    Recheck on left ankle, DOI 06-10-19.   Stephanie Coup was seen for an avulsion fracture of the ankle still complaining of pain despite immobilization home exercises.  Exam shows tenderness over the lateral fibula ankle range of motion normal no instability  I do not think any surgery is needed and think any further imaging is needed  Ambien I put her on some prednisone to see if we can get the pain to go down but otherwise she can follow-up as needed Meds ordered this encounter  Medications  . predniSONE (STERAPRED UNI-PAK 48 TAB) 10 MG (48) TBPK tablet    Sig: Take by mouth daily.    Dispense:  48 tablet    Refill:  0   Encounter Diagnosis  Name Primary?  . Closed avulsion fracture of lateral malleolus of left fibula with routine healing, subsequent encounter Yes

## 2019-09-28 ENCOUNTER — Other Ambulatory Visit: Payer: Self-pay

## 2019-09-29 ENCOUNTER — Emergency Department (HOSPITAL_COMMUNITY)
Admission: EM | Admit: 2019-09-29 | Discharge: 2019-09-29 | Discharge status: Against Medical Advice | Payer: Medicare Other

## 2019-10-19 NOTE — Progress Notes (Signed)
[facility] Banding Note:   59 year old female with history of symptomatic hemorrhoids, s/p first banding early May 2021 left lateral, June 2021 right anterior, July right posterior. She is here to discuss neutral banding. She still has prolapsing symptoms and has to push tissue back in. No rectal bleeding. Notes itching and pressure. Since initial banding, she has noted some improvement.    The patient presents with symptomatic grade 3 hemorrhoids, unresponsive to maximal medical therapy, requesting rubber band ligation of her hemorrhoidal disease. All risks, benefits, and alternative forms of therapy were described and informed consent was obtained.  The decision was made to band neutrally, and the [place] was used to perform band ligation without complication. Digital anorectal examination was then performed to assure proper positioning of the band, and to adjust the banded tissue as required. The patient was discharged home without pain or other issues. Dietary and behavioral recommendations were given and (if necessary prescriptions were given), along with follow-up instructions. The patient will return in 6 months for followup. She is to call if any recurrent symptoms, and I would consider neutral banding again. She does have significant external hemorrhoid tags, which we discussed would like continue. As long as no issues, will treat conservatively.  No complications were encountered and the patient tolerated the procedure well.  Annitta Needs, PhD, ANP-BC [facility]

## 2019-10-20 ENCOUNTER — Ambulatory Visit (INDEPENDENT_AMBULATORY_CARE_PROVIDER_SITE_OTHER): Payer: Medicare Other | Admitting: Gastroenterology

## 2019-10-20 ENCOUNTER — Other Ambulatory Visit: Payer: Self-pay

## 2019-10-20 ENCOUNTER — Encounter: Payer: Self-pay | Admitting: Gastroenterology

## 2019-10-20 VITALS — BP 107/64 | HR 68 | Temp 97.9°F | Ht 62.0 in | Wt 143.6 lb

## 2019-10-20 DIAGNOSIS — K642 Third degree hemorrhoids: Secondary | ICD-10-CM

## 2019-10-20 NOTE — Patient Instructions (Signed)
Please call if you have any further symptoms. We can try to band again.   Otherwise, I will see you in 6 months! Continue to avoid straining, constipation, and limit toilet time as you are doing!  I enjoyed seeing you again today! As you know, I value our relationship and want to provide genuine, compassionate, and quality care. I welcome your feedback. If you receive a survey regarding your visit,  I greatly appreciate you taking time to fill this out. See you next time!  Annitta Needs, PhD, ANP-BC [facility]

## 2019-12-28 ENCOUNTER — Other Ambulatory Visit (HOSPITAL_COMMUNITY): Payer: Self-pay | Admitting: Family Medicine

## 2019-12-28 DIAGNOSIS — M545 Low back pain, unspecified: Secondary | ICD-10-CM

## 2020-01-04 ENCOUNTER — Other Ambulatory Visit: Payer: Self-pay

## 2020-01-04 ENCOUNTER — Ambulatory Visit (HOSPITAL_COMMUNITY)
Admission: RE | Admit: 2020-01-04 | Discharge: 2020-01-04 | Disposition: A | Discharge status: Home / Self Care | Payer: Medicare Other | Source: Ambulatory Visit | Attending: Family Medicine | Admitting: Family Medicine

## 2020-01-04 DIAGNOSIS — M545 Low back pain, unspecified: Secondary | ICD-10-CM | POA: Insufficient documentation

## 2020-02-27 ENCOUNTER — Ambulatory Visit (HOSPITAL_COMMUNITY): Payer: Medicare Other | Admitting: Physical Therapy

## 2020-02-29 ENCOUNTER — Ambulatory Visit (HOSPITAL_COMMUNITY): Payer: Medicare Other | Attending: Pediatrics | Admitting: Physical Therapy

## 2020-03-06 ENCOUNTER — Other Ambulatory Visit: Payer: Self-pay | Admitting: Neurosurgery

## 2020-03-08 ENCOUNTER — Other Ambulatory Visit: Payer: Medicare Other

## 2020-03-08 DIAGNOSIS — Z20822 Contact with and (suspected) exposure to covid-19: Secondary | ICD-10-CM

## 2020-03-09 LAB — SARS-COV-2, NAA 2 DAY TAT

## 2020-03-09 LAB — NOVEL CORONAVIRUS, NAA: SARS-CoV-2, NAA: NOT DETECTED

## 2020-03-16 ENCOUNTER — Other Ambulatory Visit (HOSPITAL_COMMUNITY)
Admission: RE | Admit: 2020-03-16 | Discharge: 2020-03-16 | Disposition: A | Discharge status: Home / Self Care | Payer: Medicare Other | Source: Ambulatory Visit | Attending: Neurosurgery | Admitting: Neurosurgery

## 2020-03-16 DIAGNOSIS — Z20822 Contact with and (suspected) exposure to covid-19: Secondary | ICD-10-CM | POA: Insufficient documentation

## 2020-03-16 DIAGNOSIS — Z01812 Encounter for preprocedural laboratory examination: Secondary | ICD-10-CM | POA: Insufficient documentation

## 2020-03-16 LAB — SARS CORONAVIRUS 2 (TAT 6-24 HRS): SARS Coronavirus 2: NEGATIVE

## 2020-03-17 ENCOUNTER — Encounter (HOSPITAL_COMMUNITY): Payer: Self-pay | Admitting: *Deleted

## 2020-03-17 NOTE — Progress Notes (Signed)
Denies chest pain, shortness of breath, or cardiology visits. States she has been in [employer] since [employer]. Educated on Environmental manager. States she rarely checks her blood sugars.

## 2020-03-19 NOTE — Anesthesia Preprocedure Evaluation (Addendum)
Anesthesia Evaluation  Patient identified by MRN, date of birth, ID band Patient awake    Reviewed: NPO status , Patient's Chart, lab work & pertinent test results, reviewed documented beta blocker date and time   Airway Mallampati: II  TM Distance: >3 FB Neck ROM: Full    Dental  (+) Edentulous Upper, Edentulous Lower   Pulmonary COPD,  COPD inhaler, Current Smoker and Patient abstained from smoking.,    Pulmonary exam normal        Cardiovascular hypertension, Pt. on medications  Rhythm:Regular Rate:Normal     Neuro/Psych  Headaches, Anxiety Depression    GI/Hepatic Neg liver ROS, hiatal hernia, GERD  ,  Endo/Other  diabetes  Renal/GU   negative genitourinary   Musculoskeletal  (+) Arthritis , Lumbar radiculopathy   Abdominal (+)  Abdomen: soft. Bowel sounds: normal.  Peds  Hematology  (+) anemia ,   Anesthesia Other Findings   Reproductive/Obstetrics                            Anesthesia Physical Anesthesia Plan  ASA: II  Anesthesia Plan: General   Post-op Pain Management:    Induction:   PONV Risk Score and Plan: 2 and Ondansetron, Dexamethasone, Midazolam and Treatment may vary due to age or medical condition  Airway Management Planned: Mask and Oral ETT  Additional Equipment: None  Intra-op Plan:   Post-operative Plan: Extubation in OR  Informed Consent: I have reviewed the patients History and Physical, chart, labs and discussed the procedure including the risks, benefits and alternatives for the proposed anesthesia with the patient or authorized representative who has indicated his/her understanding and acceptance.     Dental advisory given  Plan Discussed with: CRNA  Anesthesia Plan Comments: (Lab Results      Component                Value               Date                      WBC                      7.4                 06/11/2019                HGB                       10.0 (L)            06/11/2019                HCT                      31.6 (L)            06/11/2019                MCV                      100.0               06/11/2019                PLT  183                 06/11/2019           Lab Results      Component                Value               Date                      NA                       142                 06/12/2019                K                        5.0                 06/12/2019                CO2                      25                  06/12/2019                GLUCOSE                  92                  06/12/2019                BUN                      26 (H)              06/12/2019                CREATININE               1.20 (H)            06/12/2019                CALCIUM                  9.2                 06/12/2019                GFRNONAA                 49 (L)              06/12/2019                GFRAA                    57 (L)              06/12/2019          )       Anesthesia Quick Evaluation

## 2020-03-20 ENCOUNTER — Inpatient Hospital Stay (HOSPITAL_COMMUNITY): Payer: Medicare Other | Admitting: Anesthesiology

## 2020-03-20 ENCOUNTER — Other Ambulatory Visit: Payer: Self-pay

## 2020-03-20 ENCOUNTER — Inpatient Hospital Stay (HOSPITAL_COMMUNITY)
Admission: RE | Admit: 2020-03-20 | Discharge: 2020-03-21 | DRG: 460 | Disposition: A | Discharge status: Home / Self Care | Payer: Medicare Other | Attending: Neurosurgery | Admitting: Neurosurgery

## 2020-03-20 ENCOUNTER — Inpatient Hospital Stay (HOSPITAL_COMMUNITY)
Admission: RE | Disposition: A | Discharge status: Home / Self Care | Payer: Self-pay | Source: Home / Self Care | Attending: Neurosurgery

## 2020-03-20 ENCOUNTER — Encounter (HOSPITAL_COMMUNITY): Payer: Self-pay

## 2020-03-20 ENCOUNTER — Inpatient Hospital Stay (HOSPITAL_COMMUNITY): Payer: Medicare Other

## 2020-03-20 DIAGNOSIS — Z888 Allergy status to other drugs, medicaments and biological substances status: Secondary | ICD-10-CM | POA: Diagnosis not present

## 2020-03-20 DIAGNOSIS — I129 Hypertensive chronic kidney disease with stage 1 through stage 4 chronic kidney disease, or unspecified chronic kidney disease: Secondary | ICD-10-CM | POA: Diagnosis present

## 2020-03-20 DIAGNOSIS — Z882 Allergy status to sulfonamides status: Secondary | ICD-10-CM

## 2020-03-20 DIAGNOSIS — Z7951 Long term (current) use of inhaled steroids: Secondary | ICD-10-CM

## 2020-03-20 DIAGNOSIS — F17210 Nicotine dependence, cigarettes, uncomplicated: Secondary | ICD-10-CM | POA: Diagnosis present

## 2020-03-20 DIAGNOSIS — J449 Chronic obstructive pulmonary disease, unspecified: Secondary | ICD-10-CM | POA: Diagnosis present

## 2020-03-20 DIAGNOSIS — M4316 Spondylolisthesis, lumbar region: Secondary | ICD-10-CM | POA: Diagnosis present

## 2020-03-20 DIAGNOSIS — Z880 Allergy status to penicillin: Secondary | ICD-10-CM

## 2020-03-20 DIAGNOSIS — M5416 Radiculopathy, lumbar region: Secondary | ICD-10-CM | POA: Diagnosis present

## 2020-03-20 DIAGNOSIS — Z8249 Family history of ischemic heart disease and other diseases of the circulatory system: Secondary | ICD-10-CM

## 2020-03-20 DIAGNOSIS — M48061 Spinal stenosis, lumbar region without neurogenic claudication: Secondary | ICD-10-CM | POA: Diagnosis present

## 2020-03-20 DIAGNOSIS — N183 Chronic kidney disease, stage 3 unspecified: Secondary | ICD-10-CM | POA: Diagnosis present

## 2020-03-20 DIAGNOSIS — Z419 Encounter for procedure for purposes other than remedying health state, unspecified: Secondary | ICD-10-CM

## 2020-03-20 DIAGNOSIS — F418 Other specified anxiety disorders: Secondary | ICD-10-CM | POA: Diagnosis present

## 2020-03-20 DIAGNOSIS — Z881 Allergy status to other antibiotic agents status: Secondary | ICD-10-CM

## 2020-03-20 DIAGNOSIS — K219 Gastro-esophageal reflux disease without esophagitis: Secondary | ICD-10-CM | POA: Diagnosis present

## 2020-03-20 DIAGNOSIS — E1122 Type 2 diabetes mellitus with diabetic chronic kidney disease: Secondary | ICD-10-CM | POA: Diagnosis present

## 2020-03-20 DIAGNOSIS — Z79899 Other long term (current) drug therapy: Secondary | ICD-10-CM | POA: Diagnosis not present

## 2020-03-20 DIAGNOSIS — Z791 Long term (current) use of non-steroidal anti-inflammatories (NSAID): Secondary | ICD-10-CM

## 2020-03-20 HISTORY — PX: TRANSFORAMINAL LUMBAR INTERBODY FUSION W/ MIS 1 LEVEL: SHX6145

## 2020-03-20 LAB — CBC
HCT: 37.9 % (ref 36.0–46.0)
Hemoglobin: 12.7 g/dL (ref 12.0–15.0)
MCH: 31.3 pg (ref 26.0–34.0)
MCHC: 33.5 g/dL (ref 30.0–36.0)
MCV: 93.3 fL (ref 80.0–100.0)
Platelets: 231 10*3/uL (ref 150–400)
RBC: 4.06 MIL/uL (ref 3.87–5.11)
RDW: 12.7 % (ref 11.5–15.5)
WBC: 7.7 10*3/uL (ref 4.0–10.5)
nRBC: 0 % (ref 0.0–0.2)

## 2020-03-20 LAB — TYPE AND SCREEN
ABO/RH(D): A POS
Antibody Screen: NEGATIVE

## 2020-03-20 LAB — BASIC METABOLIC PANEL
Anion gap: 10 (ref 5–15)
BUN: 6 mg/dL (ref 6–20)
CO2: 26 mmol/L (ref 22–32)
Calcium: 10.1 mg/dL (ref 8.9–10.3)
Chloride: 101 mmol/L (ref 98–111)
Creatinine, Ser: 1.04 mg/dL — ABNORMAL HIGH (ref 0.44–1.00)
GFR, Estimated: >60 mL/min (ref >60)
Glucose, Bld: 103 mg/dL — ABNORMAL HIGH (ref 70–99)
Potassium: 3.8 mmol/L (ref 3.5–5.1)
Sodium: 137 mmol/L (ref 135–145)

## 2020-03-20 LAB — GLUCOSE, CAPILLARY
Glucose-Capillary: 99 mg/dL (ref 70–99)
Glucose-Capillary: 146 mg/dL — ABNORMAL HIGH (ref 70–99)
Glucose-Capillary: 186 mg/dL — ABNORMAL HIGH (ref 70–99)
Glucose-Capillary: 133 mg/dL — ABNORMAL HIGH (ref 70–99)

## 2020-03-20 LAB — ABO/RH: ABO/RH(D): A POS

## 2020-03-20 SURGERY — MINIMALLY INVASIVE (MIS) TRANSFORAMINAL LUMBAR INTERBODY FUSION (TLIF) 1 LEVEL
Anesthesia: General

## 2020-03-20 MED ORDER — FLEET ENEMA 7-19 GM/118ML RE ENEM: 1.0000 | ENEMA | Freq: Once | RECTAL | Status: DC | PRN

## 2020-03-20 MED ORDER — CHLORHEXIDINE GLUCONATE CLOTH 2 % EX PADS: 6.0000 | MEDICATED_PAD | Freq: Once | CUTANEOUS | Status: DC

## 2020-03-20 MED ORDER — ACETAMINOPHEN 10 MG/ML IV SOLN
1000.0000 mg | Freq: Once | INTRAVENOUS | Status: DC | PRN
  Administered 2020-03-20: 1000 mg via INTRAVENOUS

## 2020-03-20 MED ORDER — KETAMINE HCL 10 MG/ML IJ SOLN
INTRAMUSCULAR | Status: DC | PRN
  Administered 2020-03-20 (×3): 10 mg via INTRAVENOUS
  Administered 2020-03-20: 20 mg via INTRAVENOUS

## 2020-03-20 MED ORDER — ROCURONIUM BROMIDE 10 MG/ML (PF) SYRINGE
PREFILLED_SYRINGE | INTRAVENOUS | Status: AC
  Filled 2020-03-20: qty 10

## 2020-03-20 MED ORDER — ONDANSETRON HCL 4 MG/2ML IJ SOLN
INTRAMUSCULAR | Status: AC
  Filled 2020-03-20: qty 2

## 2020-03-20 MED ORDER — FAMOTIDINE 20 MG PO TABS
10.0000 mg | ORAL_TABLET | Freq: Every day | ORAL | Status: DC
  Administered 2020-03-20 – 2020-03-21 (×2): 10 mg via ORAL
  Filled 2020-03-20 (×2): qty 1

## 2020-03-20 MED ORDER — HYDROCORTISONE (PERIANAL) 2.5 % EX CREA
1.0000 "application " | TOPICAL_CREAM | Freq: Two times a day (BID) | CUTANEOUS | Status: DC | PRN
  Filled 2020-03-20: qty 28.35

## 2020-03-20 MED ORDER — THROMBIN 5000 UNITS EX SOLR
OROMUCOSAL | Status: DC | PRN
  Administered 2020-03-20: 5 mL via TOPICAL

## 2020-03-20 MED ORDER — VITAMIN D3 25 MCG (1000 UNIT) PO TABS
2000.0000 [IU] | ORAL_TABLET | Freq: Every morning | ORAL | Status: DC
  Administered 2020-03-21: 2000 [IU] via ORAL
  Filled 2020-03-20: qty 2

## 2020-03-20 MED ORDER — VANCOMYCIN HCL IN DEXTROSE 1-5 GM/200ML-% IV SOLN
1000.0000 mg | INTRAVENOUS | Status: AC
  Administered 2020-03-20: 1000 mg via INTRAVENOUS
  Filled 2020-03-20: qty 200

## 2020-03-20 MED ORDER — UMECLIDINIUM BROMIDE 62.5 MCG/INH IN AEPB
1.0000 | INHALATION_SPRAY | Freq: Every day | RESPIRATORY_TRACT | Status: DC
  Administered 2020-03-21: 1 via RESPIRATORY_TRACT
  Filled 2020-03-20: qty 7

## 2020-03-20 MED ORDER — DEXAMETHASONE SODIUM PHOSPHATE 10 MG/ML IJ SOLN
INTRAMUSCULAR | Status: AC
  Filled 2020-03-20: qty 1

## 2020-03-20 MED ORDER — HYDROMORPHONE HCL 1 MG/ML IJ SOLN
INTRAMUSCULAR | Status: AC
  Filled 2020-03-20: qty 1

## 2020-03-20 MED ORDER — SODIUM CHLORIDE 0.9 % IV SOLN: 250.0000 mL | INTRAVENOUS | Status: DC

## 2020-03-20 MED ORDER — ENOXAPARIN SODIUM 40 MG/0.4ML ~~LOC~~ SOLN
40.0000 mg | SUBCUTANEOUS | Status: DC
  Administered 2020-03-21: 40 mg via SUBCUTANEOUS
  Filled 2020-03-20: qty 0.4

## 2020-03-20 MED ORDER — PHENYLEPHRINE 40 MCG/ML (10ML) SYRINGE FOR IV PUSH (FOR BLOOD PRESSURE SUPPORT)
PREFILLED_SYRINGE | INTRAVENOUS | Status: DC | PRN
  Administered 2020-03-20: 40 ug via INTRAVENOUS

## 2020-03-20 MED ORDER — POLYETHYLENE GLYCOL 3350 17 G PO PACK: 17.0000 g | PACK | Freq: Every day | ORAL | Status: DC | PRN

## 2020-03-20 MED ORDER — BUSPIRONE HCL 15 MG PO TABS
15.0000 mg | ORAL_TABLET | Freq: Two times a day (BID) | ORAL | Status: DC
  Administered 2020-03-20 – 2020-03-21 (×2): 15 mg via ORAL
  Filled 2020-03-20 (×3): qty 1

## 2020-03-20 MED ORDER — FENTANYL CITRATE (PF) 250 MCG/5ML IJ SOLN
INTRAMUSCULAR | Status: AC
  Filled 2020-03-20: qty 5

## 2020-03-20 MED ORDER — CYCLOBENZAPRINE HCL 10 MG PO TABS
10.0000 mg | ORAL_TABLET | Freq: Three times a day (TID) | ORAL | Status: DC | PRN
Start: 2020-03-20 — End: 2020-03-21
  Administered 2020-03-20: 10 mg via ORAL

## 2020-03-20 MED ORDER — PHENOL 1.4 % MT LIQD: 1.0000 | OROMUCOSAL | Status: DC | PRN

## 2020-03-20 MED ORDER — MIDAZOLAM HCL 5 MG/5ML IJ SOLN
INTRAMUSCULAR | Status: DC | PRN
  Administered 2020-03-20: 2 mg via INTRAVENOUS

## 2020-03-20 MED ORDER — PROMETHAZINE HCL 25 MG/ML IJ SOLN: 6.2500 mg | INTRAMUSCULAR | Status: DC | PRN

## 2020-03-20 MED ORDER — QUETIAPINE FUMARATE 400 MG PO TABS
400.0000 mg | ORAL_TABLET | Freq: Every day | ORAL | Status: DC
  Filled 2020-03-20: qty 1

## 2020-03-20 MED ORDER — SODIUM CHLORIDE (PF) 0.9 % IJ SOLN
INTRAMUSCULAR | Status: DC | PRN
  Administered 2020-03-20: 10 mL

## 2020-03-20 MED ORDER — DEXAMETHASONE SODIUM PHOSPHATE 10 MG/ML IJ SOLN
INTRAMUSCULAR | Status: DC | PRN
  Administered 2020-03-20: 10 mg via INTRAVENOUS

## 2020-03-20 MED ORDER — ACETAMINOPHEN 650 MG RE SUPP
650.0000 mg | RECTAL | Status: DC | PRN
Start: 2020-03-20 — End: 2020-03-21

## 2020-03-20 MED ORDER — VITAMIN B-12 1000 MCG PO TABS
1000.0000 ug | ORAL_TABLET | Freq: Two times a day (BID) | ORAL | Status: DC
  Administered 2020-03-20 – 2020-03-21 (×2): 1000 ug via ORAL
  Filled 2020-03-20 (×2): qty 1

## 2020-03-20 MED ORDER — LIDOCAINE-EPINEPHRINE 1 %-1:100000 IJ SOLN
INTRAMUSCULAR | Status: DC | PRN
  Administered 2020-03-20: 10 mL

## 2020-03-20 MED ORDER — IPRATROPIUM-ALBUTEROL 0.5-2.5 (3) MG/3ML IN SOLN: 3.0000 mL | Freq: Four times a day (QID) | RESPIRATORY_TRACT | Status: DC | PRN

## 2020-03-20 MED ORDER — LIDOCAINE-EPINEPHRINE 1 %-1:100000 IJ SOLN
INTRAMUSCULAR | Status: AC
  Filled 2020-03-20: qty 1

## 2020-03-20 MED ORDER — ORAL CARE MOUTH RINSE: 15.0000 mL | Freq: Once | OROMUCOSAL | Status: AC

## 2020-03-20 MED ORDER — LIDOCAINE 2% (20 MG/ML) 5 ML SYRINGE
INTRAMUSCULAR | Status: DC | PRN
  Administered 2020-03-20: 60 mg via INTRAVENOUS

## 2020-03-20 MED ORDER — SUGAMMADEX SODIUM 200 MG/2ML IV SOLN
INTRAVENOUS | Status: DC | PRN
  Administered 2020-03-20: 200 mg via INTRAVENOUS

## 2020-03-20 MED ORDER — ACETAMINOPHEN 10 MG/ML IV SOLN
INTRAVENOUS | Status: AC
  Filled 2020-03-20: qty 100

## 2020-03-20 MED ORDER — CYCLOBENZAPRINE HCL 10 MG PO TABS
ORAL_TABLET | ORAL | Status: AC
  Filled 2020-03-20: qty 1

## 2020-03-20 MED ORDER — VANCOMYCIN HCL 1250 MG/250ML IV SOLN
1250.0000 mg | Freq: Two times a day (BID) | INTRAVENOUS | Status: AC
  Administered 2020-03-20 – 2020-03-21 (×2): 1250 mg via INTRAVENOUS
  Filled 2020-03-20 (×2): qty 250

## 2020-03-20 MED ORDER — MORPHINE SULFATE (PF) 2 MG/ML IV SOLN
2.0000 mg | INTRAVENOUS | Status: DC | PRN
Start: 2020-03-20 — End: 2020-03-21

## 2020-03-20 MED ORDER — LIDOCAINE 2% (20 MG/ML) 5 ML SYRINGE
INTRAMUSCULAR | Status: AC
  Filled 2020-03-20: qty 5

## 2020-03-20 MED ORDER — THROMBIN (RECOMBINANT) 5000 UNITS EX SOLR
CUTANEOUS | Status: AC
  Filled 2020-03-20: qty 5000

## 2020-03-20 MED ORDER — FLUTICASONE FUROATE-VILANTEROL 100-25 MCG/INH IN AEPB
1.0000 | INHALATION_SPRAY | Freq: Every day | RESPIRATORY_TRACT | Status: DC
  Administered 2020-03-21: 08:00:00 1 via RESPIRATORY_TRACT
  Filled 2020-03-20: qty 28

## 2020-03-20 MED ORDER — PROPOFOL 10 MG/ML IV BOLUS
INTRAVENOUS | Status: DC | PRN
  Administered 2020-03-20: 140 mg via INTRAVENOUS
  Administered 2020-03-20: 30 mg via INTRAVENOUS

## 2020-03-20 MED ORDER — PRAVASTATIN SODIUM 10 MG PO TABS
20.0000 mg | ORAL_TABLET | Freq: Every morning | ORAL | Status: DC
  Administered 2020-03-21: 20 mg via ORAL
  Filled 2020-03-20: qty 2

## 2020-03-20 MED ORDER — PHENYLEPHRINE 40 MCG/ML (10ML) SYRINGE FOR IV PUSH (FOR BLOOD PRESSURE SUPPORT)
PREFILLED_SYRINGE | INTRAVENOUS | Status: AC
  Filled 2020-03-20: qty 10

## 2020-03-20 MED ORDER — BUPIVACAINE HCL (PF) 0.5 % IJ SOLN
INTRAMUSCULAR | Status: DC | PRN
  Administered 2020-03-20: 30 mL

## 2020-03-20 MED ORDER — DIPHENHYDRAMINE HCL 50 MG/ML IJ SOLN
INTRAMUSCULAR | Status: DC | PRN
  Administered 2020-03-20: 12.5 mg via INTRAVENOUS

## 2020-03-20 MED ORDER — HYDROMORPHONE HCL 1 MG/ML IJ SOLN
0.2500 mg | INTRAMUSCULAR | Status: DC | PRN
  Administered 2020-03-20 (×2): 0.5 mg via INTRAVENOUS

## 2020-03-20 MED ORDER — BUPIVACAINE HCL (PF) 0.5 % IJ SOLN
INTRAMUSCULAR | Status: AC
  Filled 2020-03-20: qty 30

## 2020-03-20 MED ORDER — ROCURONIUM BROMIDE 100 MG/10ML IV SOLN
INTRAVENOUS | Status: DC | PRN
  Administered 2020-03-20 (×2): 20 mg via INTRAVENOUS
  Administered 2020-03-20: 50 mg via INTRAVENOUS
  Administered 2020-03-20: 10 mg via INTRAVENOUS
  Administered 2020-03-20: 20 mg via INTRAVENOUS

## 2020-03-20 MED ORDER — ONDANSETRON HCL 4 MG/2ML IJ SOLN
INTRAMUSCULAR | Status: DC | PRN
  Administered 2020-03-20: 4 mg via INTRAVENOUS

## 2020-03-20 MED ORDER — ACETAMINOPHEN 325 MG PO TABS: 650.0000 mg | ORAL_TABLET | ORAL | Status: DC | PRN

## 2020-03-20 MED ORDER — ONDANSETRON HCL 4 MG/2ML IJ SOLN: 4.0000 mg | Freq: Four times a day (QID) | INTRAMUSCULAR | Status: DC | PRN

## 2020-03-20 MED ORDER — AMISULPRIDE (ANTIEMETIC) 5 MG/2ML IV SOLN: 10.0000 mg | Freq: Once | INTRAVENOUS | Status: DC | PRN

## 2020-03-20 MED ORDER — PROPOFOL 10 MG/ML IV BOLUS
INTRAVENOUS | Status: AC
  Filled 2020-03-20: qty 20

## 2020-03-20 MED ORDER — ALBUTEROL SULFATE HFA 108 (90 BASE) MCG/ACT IN AERS
1.0000 | INHALATION_SPRAY | RESPIRATORY_TRACT | Status: DC | PRN
  Filled 2020-03-20: qty 6.7

## 2020-03-20 MED ORDER — ONDANSETRON HCL 4 MG PO TABS: 4.0000 mg | ORAL_TABLET | Freq: Four times a day (QID) | ORAL | Status: DC | PRN

## 2020-03-20 MED ORDER — EPHEDRINE 5 MG/ML INJ
INTRAVENOUS | Status: AC
  Filled 2020-03-20: qty 10

## 2020-03-20 MED ORDER — BUPIVACAINE LIPOSOME 1.3 % IJ SUSP
20.0000 mL | INTRAMUSCULAR | Status: AC
  Administered 2020-03-20: 20 mL
  Filled 2020-03-20: qty 20

## 2020-03-20 MED ORDER — LINACLOTIDE 145 MCG PO CAPS
290.0000 ug | ORAL_CAPSULE | ORAL | Status: DC
  Filled 2020-03-20: qty 2

## 2020-03-20 MED ORDER — BUPROPION HCL ER (SR) 150 MG PO TB12: 150.0000 mg | ORAL_TABLET | Freq: Every day | ORAL | Status: DC

## 2020-03-20 MED ORDER — SODIUM CHLORIDE 0.9% FLUSH: 3.0000 mL | INTRAVENOUS | Status: DC | PRN

## 2020-03-20 MED ORDER — POTASSIUM CHLORIDE IN NACL 20-0.9 MEQ/L-% IV SOLN: INTRAVENOUS | Status: DC

## 2020-03-20 MED ORDER — DIPHENHYDRAMINE HCL 50 MG/ML IJ SOLN
INTRAMUSCULAR | Status: AC
  Filled 2020-03-20: qty 1

## 2020-03-20 MED ORDER — EPHEDRINE SULFATE-NACL 50-0.9 MG/10ML-% IV SOSY
PREFILLED_SYRINGE | INTRAVENOUS | Status: DC | PRN
  Administered 2020-03-20: 5 mg via INTRAVENOUS

## 2020-03-20 MED ORDER — MIDAZOLAM HCL 2 MG/2ML IJ SOLN
INTRAMUSCULAR | Status: AC
  Filled 2020-03-20: qty 2

## 2020-03-20 MED ORDER — KETAMINE HCL 50 MG/5ML IJ SOSY
PREFILLED_SYRINGE | INTRAMUSCULAR | Status: AC
  Filled 2020-03-20: qty 5

## 2020-03-20 MED ORDER — MIRTAZAPINE 7.5 MG PO TABS
7.5000 mg | ORAL_TABLET | Freq: Every day | ORAL | Status: DC
  Administered 2020-03-20: 7.5 mg via ORAL
  Filled 2020-03-20 (×2): qty 1

## 2020-03-20 MED ORDER — VENLAFAXINE HCL ER 75 MG PO CP24
150.0000 mg | ORAL_CAPSULE | Freq: Every day | ORAL | Status: DC
  Administered 2020-03-21: 150 mg via ORAL
  Filled 2020-03-20: qty 2

## 2020-03-20 MED ORDER — OXYCODONE-ACETAMINOPHEN 5-325 MG PO TABS
1.0000 | ORAL_TABLET | Freq: Four times a day (QID) | ORAL | Status: DC | PRN
  Administered 2020-03-20 – 2020-03-21 (×4): 2 via ORAL
  Filled 2020-03-20 (×4): qty 2

## 2020-03-20 MED ORDER — GABAPENTIN 300 MG PO CAPS
300.0000 mg | ORAL_CAPSULE | Freq: Three times a day (TID) | ORAL | Status: DC
  Administered 2020-03-20 – 2020-03-21 (×3): 300 mg via ORAL
  Filled 2020-03-20 (×3): qty 1

## 2020-03-20 MED ORDER — LACTATED RINGERS IV SOLN: INTRAVENOUS | Status: DC

## 2020-03-20 MED ORDER — FLUTICASONE-UMECLIDIN-VILANT 100-62.5-25 MCG/INH IN AEPB: 1.0000 | INHALATION_SPRAY | Freq: Every day | RESPIRATORY_TRACT | Status: DC

## 2020-03-20 MED ORDER — DOCUSATE SODIUM 100 MG PO CAPS
100.0000 mg | ORAL_CAPSULE | Freq: Two times a day (BID) | ORAL | Status: DC
  Administered 2020-03-20 – 2020-03-21 (×3): 100 mg via ORAL
  Filled 2020-03-20 (×3): qty 1

## 2020-03-20 MED ORDER — SODIUM CHLORIDE 0.9% FLUSH
3.0000 mL | Freq: Two times a day (BID) | INTRAVENOUS | Status: DC
  Administered 2020-03-20: 3 mL via INTRAVENOUS

## 2020-03-20 MED ORDER — MENTHOL 3 MG MT LOZG: 1.0000 | LOZENGE | OROMUCOSAL | Status: DC | PRN

## 2020-03-20 MED ORDER — FENTANYL CITRATE (PF) 100 MCG/2ML IJ SOLN
INTRAMUSCULAR | Status: DC | PRN
  Administered 2020-03-20 (×2): 100 ug via INTRAVENOUS
  Administered 2020-03-20: 50 ug via INTRAVENOUS

## 2020-03-20 MED ORDER — CHLORHEXIDINE GLUCONATE 0.12 % MT SOLN
15.0000 mL | Freq: Once | OROMUCOSAL | Status: AC
  Administered 2020-03-20: 15 mL via OROMUCOSAL
  Filled 2020-03-20: qty 15

## 2020-03-20 MED ORDER — 0.9 % SODIUM CHLORIDE (POUR BTL) OPTIME
TOPICAL | Status: DC | PRN
  Administered 2020-03-20: 1000 mL

## 2020-03-20 SURGICAL SUPPLY — 77 items
ADH SKN CLS APL DERMABOND .7 (GAUZE/BANDAGES/DRESSINGS) ×1
BAND INSRT 18 STRL LF DISP RB (MISCELLANEOUS) ×2
BAND RUBBER #18 3X1/16 STRL (MISCELLANEOUS) ×4 IMPLANT
BASKET BONE COLLECTION (BASKET) ×2 IMPLANT
BLADE CLIPPER SURG (BLADE) IMPLANT
BLADE SURG 11 STRL SS (BLADE) ×3 IMPLANT
BUR MATCHSTICK NEURO 3.0 LAGG (BURR) ×3 IMPLANT
BUR PRECISION FLUTE 5.0 (BURR) ×3 IMPLANT
CAGE INTERBODY PL LG 7X26.5X24 (Cage) ×2 IMPLANT
CANISTER SUCT 3000ML PPV (MISCELLANEOUS) ×3 IMPLANT
CNTNR URN SCR LID CUP LEK RST (MISCELLANEOUS) ×1 IMPLANT
CONT SPEC 4OZ STRL OR WHT (MISCELLANEOUS) ×3
COVER BACK TABLE 60X90IN (DRAPES) ×3 IMPLANT
COVER WAND RF STERILE (DRAPES) ×1 IMPLANT
DECANTER SPIKE VIAL GLASS SM (MISCELLANEOUS) ×3 IMPLANT
DERMABOND ADVANCED (GAUZE/BANDAGES/DRESSINGS) ×2
DERMABOND ADVANCED .7 DNX12 (GAUZE/BANDAGES/DRESSINGS) ×2 IMPLANT
DRAIN JACKSON PRATT 10MM FLAT (MISCELLANEOUS) IMPLANT
DRAPE 3/4 80X56 (DRAPES) ×3 IMPLANT
DRAPE C-ARM 42X72 X-RAY (DRAPES) ×3 IMPLANT
DRAPE C-ARMOR (DRAPES) ×3 IMPLANT
DRAPE LAPAROTOMY 100X72X124 (DRAPES) ×3 IMPLANT
DRAPE MICROSCOPE LEICA (MISCELLANEOUS) ×3 IMPLANT
DRSG OPSITE POSTOP 4X6 (GAUZE/BANDAGES/DRESSINGS) IMPLANT
DURAPREP 26ML APPLICATOR (WOUND CARE) ×3 IMPLANT
ELECT BLADE INSULATED 6.5IN (ELECTROSURGICAL) ×6
ELECT COATED BLADE 2.86 ST (ELECTRODE) ×3 IMPLANT
ELECT REM PT RETURN 9FT ADLT (ELECTROSURGICAL) ×3
ELECTRODE BLDE INSULATED 6.5IN (ELECTROSURGICAL) ×1 IMPLANT
ELECTRODE REM PT RTRN 9FT ADLT (ELECTROSURGICAL) ×1 IMPLANT
EVACUATOR SILICONE 100CC (DRAIN) IMPLANT
EXTENDER TAB GUIDE SV 5.5/6.0 (INSTRUMENTS) ×16 IMPLANT
GAUZE 4X4 16PLY RFD (DISPOSABLE) ×4 IMPLANT
GAUZE SPONGE 4X4 12PLY STRL (GAUZE/BANDAGES/DRESSINGS) IMPLANT
GLOVE BIOGEL PI IND STRL 7.5 (GLOVE) ×1 IMPLANT
GLOVE BIOGEL PI INDICATOR 7.5 (GLOVE) ×4
GLOVE ECLIPSE 7.5 STRL STRAW (GLOVE) ×3 IMPLANT
GLOVE EXAM NITRILE XL STR (GLOVE) IMPLANT
GOWN STRL REUS W/ TWL LRG LVL3 (GOWN DISPOSABLE) ×1 IMPLANT
GOWN STRL REUS W/ TWL XL LVL3 (GOWN DISPOSABLE) ×1 IMPLANT
GOWN STRL REUS W/TWL 2XL LVL3 (GOWN DISPOSABLE) IMPLANT
GOWN STRL REUS W/TWL LRG LVL3 (GOWN DISPOSABLE) ×3
GOWN STRL REUS W/TWL XL LVL3 (GOWN DISPOSABLE) ×3
GUIDEWIRE BLUNT NT 450 (WIRE) ×8 IMPLANT
HEMOSTAT POWDER KIT SURGIFOAM (HEMOSTASIS) ×3 IMPLANT
KIT BASIN OR (CUSTOM PROCEDURE TRAY) ×3 IMPLANT
KIT INFUSE X SMALL 1.4CC (Orthopedic Implant) ×2 IMPLANT
KIT TURNOVER KIT B (KITS) ×3 IMPLANT
MILL MEDIUM DISP (BLADE) ×3 IMPLANT
NDL HYPO 18GX1.5 BLUNT FILL (NEEDLE) IMPLANT
NDL HYPO 21X1.5 SAFETY (NEEDLE) IMPLANT
NDL SPNL 18GX3.5 QUINCKE PK (NEEDLE) IMPLANT
NEEDLE HYPO 18GX1.5 BLUNT FILL (NEEDLE) IMPLANT
NEEDLE HYPO 21X1.5 SAFETY (NEEDLE) ×6 IMPLANT
NEEDLE HYPO 22GX1.5 SAFETY (NEEDLE) ×3 IMPLANT
NEEDLE RANFAC 8GAX15CM ×8 IMPLANT
NEEDLE SPNL 18GX3.5 QUINCKE PK (NEEDLE) ×3 IMPLANT
NS IRRIG 1000ML POUR BTL (IV SOLUTION) ×3 IMPLANT
PACK LAMINECTOMY NEURO (CUSTOM PROCEDURE TRAY) ×3 IMPLANT
PAD ARMBOARD 7.5X6 YLW CONV (MISCELLANEOUS) ×9 IMPLANT
PUTTY GRAFTON DBF 6CC W/DELIVE (Putty) ×2 IMPLANT
ROD 5.5 CCM PERC 40 (Rod) ×2 IMPLANT
ROD 5.5X45MM SOLERA VOYAGER (Rod) ×2 IMPLANT
SCREW MAS VOYAGER 6.5X40 (Screw) ×4 IMPLANT
SCREW SET 5.5/6.0MM SOLERA (Screw) ×8 IMPLANT
SCREW SPINAL IFIX 6.5X45 (Screw) ×4 IMPLANT
SPONGE LAP 4X18 RFD (DISPOSABLE) IMPLANT
SPONGE SURGIFOAM ABS GEL 100 (HEMOSTASIS) IMPLANT
SUT MNCRL AB 4-0 PS2 18 (SUTURE) ×3 IMPLANT
SUT VIC AB 0 CT1 18XCR BRD8 (SUTURE) ×1 IMPLANT
SUT VIC AB 0 CT1 8-18 (SUTURE) ×3
SUT VIC AB 2-0 CP2 18 (SUTURE) ×5 IMPLANT
SYR 30ML LL (SYRINGE) ×3 IMPLANT
TOWEL GREEN STERILE (TOWEL DISPOSABLE) ×3 IMPLANT
TOWEL GREEN STERILE FF (TOWEL DISPOSABLE) ×3 IMPLANT
TRAY FOLEY MTR SLVR 16FR STAT (SET/KITS/TRAYS/PACK) ×3 IMPLANT
WATER STERILE IRR 1000ML POUR (IV SOLUTION) ×3 IMPLANT

## 2020-03-20 NOTE — Transfer of Care (Signed)
Immediate Anesthesia Transfer of Care Note  Patient: Tanya Velazquez  Procedure(s) Performed: MINIMALLY INVASIVE (MIS) TRANSFORAMINAL LUMBAR INTERBODY FUSION (TLIF) LUMBAR FOUR-LUMBAR FIVE, RIGHT (N/A )  Patient Location: PACU  Anesthesia Type:General  Level of Consciousness: drowsy and patient cooperative  Airway & Oxygen Therapy: Patient Spontanous Breathing and Patient connected to face mask oxygen  Post-op Assessment: Report given to RN and Post -op Vital signs reviewed and stable  Post vital signs: Reviewed and stable  Last Vitals:  Vitals Value Taken Time  BP 147/85 03/20/20 1100  Temp    Pulse 87 03/20/20 1101  Resp 20 03/20/20 1101  SpO2 100 % 03/20/20 1101  Vitals shown include unvalidated device data.  Last Pain:  Vitals:   03/20/20 0620  TempSrc:   PainSc: 7       Patients Stated Pain Goal: 5 (09/98/33 8250)  Complications: No complications documented.

## 2020-03-20 NOTE — Progress Notes (Addendum)
Pharmacy Antibiotic Note  Tanya Velazquez is a 60 y.o. female admitted on 03/20/2020 with back pain, severe right leg pain, and numbness s/p surgery for surgical prophylaxis.  Pharmacy has been consulted for Vancomycin dosing. Incision was closed and no drain present. Patient has a history of CKD with baseline SCr ~1 to 1.2.   Current SCr is at baseline.  Pre-op Vancomycin 1gm given at 0730 AM.   Plan: Vancomycin 1250mg  IV every 12 hours x2 doses (per consult request) - starting at [place] PM.  No further vancomycin needed due to lack of drain.  Pharmacy will sign off. Please re-consult if needed.   Height: 5\' 2"  (157.5 cm) Weight: 68 kg (149 lb 14.6 oz) IBW/kg (Calculated) : 50.1  Temp (24hrs), Avg:97.9 F (36.6 C), Min:97.5 F (36.4 C), Max:98.6 F (37 C)  Recent Labs  Lab 03/20/20 0544  WBC 7.7  CREATININE 1.04*    Estimated Creatinine Clearance: 52.7 mL/min (A) (by C-G formula based on SCr of 1.04 mg/dL (H)).    Allergies  Allergen Reactions  . Amicinonide-Benzyl Alcohol [Cyclocort] Rash  . Amoxicillin Rash       . Doxycycline Rash  . Paxil [Paroxetine Hcl] Rash  . Penicillins Rash    Reaction: 7 years   . Sulfa Antibiotics Rash  . Trimethoprim Rash    Thank you for allowing pharmacy to be a part of this patient's care.  Sloan Leiter, PharmD, BCPS, BCCCP Clinical Pharmacist Please refer to [facility] for [place] numbers 03/20/2020 12:33 PM

## 2020-03-20 NOTE — H&P (Signed)
CC: Back pain, right leg pain  HPI:     Patient is a 60 y.o. female presents with worsening pain in her back and right leg pain that began 1 year ago.  It is worse with activity and bending.  She will occasionally get numbness in the dorsum of her foot.   She tried PT exercises at home which have not helped.  She has been cutting down on her smoking.    Patient Active Problem List   Diagnosis Date Noted  . AKI (acute kidney injury) ([place]) 06/11/2019  . Syncope and collapse   . Hypotension 06/09/2019  . Prolapsed internal hemorrhoids, grade 3 05/20/2019  . Rectal bleeding 02/04/2019  . Rectal pain 02/04/2019  . Syncope 10/07/2018  . Metatarsal fracture 10/07/2018  . Dizziness   . Essential hypertension   . Chest pain   . Gastroesophageal reflux disease   . Depression with anxiety   . CKD (chronic kidney disease), symptom management only, stage 2 (mild)   . Chronic renal failure, stage 3 (moderate)   . Acute renal failure (ARF) ([place]) 08/09/2018  . RUQ pain 04/10/2014  . Abdominal pain 04/10/2014  . Abnormal LFTs 04/10/2014  . Intrahepatic bile duct dilation 04/10/2014  . Common bile duct dilatation 04/10/2014  . Constipation 06/11/2011  . History of gastroesophageal reflux (GERD) 06/20/2010  . Family history of colon cancer 06/20/2010   Past Medical History:  Diagnosis Date  . Anxiety   . B12 deficiency anemia   . Chronic constipation   . Chronic constipation   . Chronic low back pain    lumbar radiculopathy  . COPD (chronic obstructive pulmonary disease) ([place])   . Diabetes mellitus   . DJD (degenerative joint disease)   . GERD (gastroesophageal reflux disease) 04/13/07   EGD Dr Rourk->patulous EG junction, HH, antral erosions  . H/O hiatal hernia   . Hyperplastic colon polyp 06/27/2010  . Hypertension   . Migraines   . Osteoarthritis   . Raspy voice   . Smokers' cough ([place])   . SUI (stress urinary incontinence, female)     Past Surgical History:  Procedure  Laterality Date  . APPENDECTOMY    . BLADDER SUSPENSION  10/07/2011   Procedure: [facility] PROCEDURE;  Surgeon: Malka So, MD;  Location[facility];  Service: Urology;  Laterality: N/A;  1 hour requested for this case   . CARDIAC CATHETERIZATION  08-09-2004 DR Einar Gip   NORMAL CORONARY ARTERIES/  NORMAL LVF  . CARDIAC CATHETERIZATION  2006   normal coronary arteries  . COLONOSCOPY  3/31/9   Dr Rourk->friable anal canal  . COLONOSCOPY  0 06/27/10   Dr. Gala Romney hyperplastic polyp, prominent external hemorrhoid plexus likely source of hematochezia, long tortuous colon. next tcs 06/2015.  Marland Kitchen COLONOSCOPY WITH PROPOFOL N/A 04/07/2019   Procedure: COLONOSCOPY WITH PROPOFOL;  Surgeon: Daneil Dolin, MD;  [facility];  Service: Endoscopy;  Laterality: N/A;  2:00pm - spoke w/pt, she knows to arrive at 11:30, she could not take the AM slot due to transportation  . CYSTOSCOPY  10/07/2011   Procedure: CYSTOSCOPY;  Surgeon: Malka So, MD;  Location: [facility];  Service: Urology;  Laterality: N/A;  . ESOPHAGEAL MANOMETRY  2013   Dr. Derrill Kay at [facility]. incomplete bolus clearance with some breaks in contractions but there was peristalsis  . ESOPHAGOGASTRODUODENOSCOPY  06/27/2010   Schatzki ring s/p dilation, small hiatal hernia, couple tiny antral erosions.  Marland Kitchen EXCISION VOLAR GANGLION LEFT  WRIST  06-27-2005  . HEAD-UP TILT TABLE TEST  01-21-2007 &  11-04-2004  DR Rollene Fare   HX CHRONIC RECURRENT SYNCOPAL. EPICODES--  NEGATIVE RESULT INCLUDING ISUPREL INFUSION  . HEMORRHOID SURGERY  09-13-2003  . LAPAROSCOPIC CHOLECYSTECTOMY  02-23-2007  . LAPAROTOMY W/ APPENDCTOMY AND LEFT SALPINGO-OOPHECTOMY  AGE 57'S  . pH/impedence  2013   Dr. Derrill Kay at [facility]. Increased reflux off PPI with excellent correlation between reflux events and regurgitation. Patient advised to hold PPI for study by Dr. Derrill Kay.   Marland Kitchen POLYPECTOMY  04/07/2019   Procedure: POLYPECTOMY;  Surgeon: Daneil Dolin, MD;  [facility];  Service: Endoscopy;;  . RIGHT SHOULDER ARTHROSCOPY/ DISTAL CLAVICLE RESECTION / [place] AND LABRUM TEAR/ BURSECTOMY  06-28-2010   IMPINGEMENT SYNDROME/ AC JOINT ARTHRITIS/ ROTATOR CUFF TENDINOPATHY  . SHOULDER ARTHROSCOPY  08-13-2010   LEFT SHOULDER IMPINGEMENT SYNDROME/ DJD OF AC JOINT  . STOMACH SURGERY  2013   antireflux surgery at [place]  age 47's    Medications Prior to Admission  Medication Sig Dispense Refill Last Dose  . acetaminophen (TYLENOL) 325 MG tablet Take 2 tablets (650 mg total) by mouth every 6 (six) hours as needed for mild pain (or Fever >/= 101). (Patient taking differently: Take 650 mg by mouth as needed for mild pain (or Fever >/= 101).) 12 tablet 0 03/19/2020 at Unknown time  . buPROPion (WELLBUTRIN SR) 150 MG 12 hr tablet Take 150 mg by mouth at bedtime.    03/20/2020 at 0330  . busPIRone (BUSPAR) 15 MG tablet Take 15 mg by mouth 2 (two) times daily.   03/20/2020 at 0330  . Cholecalciferol (VITAMIN D3) 50 MCG (2000 UT) TABS Take 2,000 Units by mouth in the morning.    03/19/2020 at Unknown time  . cyanocobalamin 1000 MCG tablet Take 1 tablet (1,000 mcg total) by mouth 2 (two) times daily.   03/19/2020 at Unknown time  . gabapentin (NEURONTIN) 300 MG capsule Take 300 mg by mouth 3 (three) times daily.    03/20/2020 at 0330  . hydrocortisone (ANUSOL-HC) 2.5 % rectal cream Place 1 application rectally 2 (two) times daily. (Patient taking differently: Place 1 application rectally 2 (two) times daily as needed for hemorrhoids (discomfort).) 30 g 1 Past Month at Unknown time  . ipratropium-albuterol (DUONEB) 0.5-2.5 (3) MG/3ML SOLN Inhale 3 mLs into the lungs every 6 (six) hours as needed for wheezing or shortness of breath.   03/19/2020 at Unknown time  . linaclotide (LINZESS) 290 MCG CAPS capsule Take 1 capsule (290 mcg total) by mouth daily before breakfast. (Patient taking differently: Take 290 mcg by mouth every  3 (three) days.) 90 capsule 30   . meloxicam (MOBIC) 7.5 MG tablet Take 7.5 mg by mouth 2 (two) times daily.   03/19/2020 at Unknown time  . mirtazapine (REMERON) 7.5 MG tablet Take 7.5 mg by mouth at bedtime.   03/19/2020 at Unknown time  . oxyCODONE (OXY IR/ROXICODONE) 5 MG immediate release tablet Take 1 tablet (5 mg total) by mouth every 6 (six) hours as needed for severe pain. (Patient taking differently: Take 10 mg by mouth in the morning, at noon, in the evening, and at bedtime.)   03/19/2020 at Unknown time  . pravastatin (PRAVACHOL) 20 MG tablet Take 20 mg by mouth in the morning.    03/19/2020 at Unknown time  . PROAIR HFA 108 (90 BASE) MCG/ACT inhaler Inhale 1 puff into the lungs every 4 (four) hours as needed  for wheezing or shortness of breath.   03/20/2020 at 0330  . QUEtiapine (SEROQUEL) 400 MG tablet Take 1 tablet (400 mg total) by mouth at bedtime. (Patient taking differently: Take 600 mg by mouth at bedtime.)   Past Week at Unknown time  . TRELEGY ELLIPTA 100-62.5-25 MCG/INH AEPB Inhale 1 puff into the lungs daily.   03/20/2020 at 0330  . Venlafaxine HCl 150 MG TB24 Take 150 mg by mouth daily with breakfast.   03/19/2020 at Unknown time  . famotidine (PEPCID) 20 MG tablet Take 1 tablet (20 mg total) by mouth daily. (Patient not taking: No sig reported) 30 tablet 1 Not Taking at Unknown time   Allergies  Allergen Reactions  . Amicinonide-Benzyl Alcohol [Cyclocort] Rash  . Amoxicillin Rash       . Doxycycline Rash  . Paxil [Paroxetine Hcl] Rash  . Penicillins Rash    Reaction: 7 years   . Sulfa Antibiotics Rash  . Trimethoprim Rash    Social History   Tobacco Use  . Smoking status: Current Some Day Smoker    Packs/day: 1.00    Years: 35.00    Pack years: 35.00    Types: Cigarettes  . Smokeless tobacco: Never Used  . Tobacco comment: 03/2020 down to 1/2 PPD  Substance Use Topics  . Alcohol use: No    Alcohol/week: 0.0 standard drinks    Family History  Problem Relation Age of  Onset  . Colon cancer Mother 73  . Hypertension Mother   . Heart disease Mother 34       CABG  . Alzheimer's disease Father   . Hypertension Father   . Healthy Daughter   . Heart failure Sister   . Heart Problems Sister        Cardiac stent     Review of Systems Pertinent items noted in HPI and remainder of comprehensive ROS otherwise negative.  Objective:   Patient Vitals for the past 8 hrs:  BP Temp Temp src Pulse Resp SpO2 Height Weight  03/20/20 0559 (!) 156/96 98.6 F (37 C) Oral 75 20 98 % 5\' 2"  (1.575 m) 68 kg   No intake/output data recorded. No intake/output data recorded.      General : Alert, cooperative, no distress, appears stated age   Head:  Normocephalic/atraumatic    Eyes: PERRL, conjunctiva/corneas clear, EOM's intact. Fundi could not be visualized Neck: Supple Chest:  Respirations unlabored Chest wall: no tenderness or deformity Heart: Regular rate and rhythm Abdomen: Soft, nontender and nondistended Extremities: warm and well-perfused Skin: normal turgor, color and texture Neurologic:  Alert, oriented x 3.  Eyes open spontaneously. PERRL, EOMI, VFC, no facial droop. V1-3 intact.  No dysarthria, tongue protrusion symmetric.  CNII-XII intact. Normal strength, sensation and reflexes throughout.  No pronator drift, full strength in legs, except 4/5 R DF       Data Review: MRI shows mild L4-5 spondylolisthesis with asymmetric disc space collapse worse on right, and severe right foraminal stenosis.  Assessment:   60 yo F with right leg radiculopathy  Plan:   - plan for R MIS TLIF - risks, benefits, alternatives, and expected convalescence discussed

## 2020-03-20 NOTE — Progress Notes (Signed)
Orthopedic Tech Progress Note Patient Details:  Tanya Velazquez 12-21-1960 [id number] Ordered Aspen Lumbar Brace from [employer] Patient ID: Tanya Velazquez, female   DOB: 06/09/1960, 60 y.o.   MRN: [id number]   Tanya Velazquez 03/20/2020, 11:57 AM

## 2020-03-20 NOTE — Anesthesia Procedure Notes (Signed)
Procedure Name: Intubation Date/Time: 03/20/2020 8:13 AM Performed by: Gwyndolyn Saxon, CRNA Pre-anesthesia Checklist: Patient identified, Emergency Drugs available, Suction available and Patient being monitored Patient Re-evaluated:Patient Re-evaluated prior to induction Oxygen Delivery Method: Circle system utilized Preoxygenation: Pre-oxygenation with 100% oxygen Induction Type: IV induction Ventilation: Mask ventilation without difficulty and Oral airway inserted - appropriate to patient size Laryngoscope Size: Sabra Heck and 2 Grade View: Grade I Tube type: Oral Tube size: 6.5 mm Number of attempts: 1 Airway Equipment and Method: Stylet Placement Confirmation: ETT inserted through vocal cords under direct vision,  positive ETCO2 and breath sounds checked- equal and bilateral Secured at: 21 cm Tube secured with: Tape Dental Injury: Teeth and Oropharynx as per pre-operative assessment

## 2020-03-20 NOTE — Evaluation (Signed)
Physical Therapy Evaluation Patient Details Name: Tanya Velazquez MRN: [id number] DOB: 12-Nov-1960 Today's Date: 03/20/2020   History of Present Illness  Patient is a 60 y/o female who presents s/p Right L4-5 TLIF 03/19/20. PMH includes HTN, DM, migraines, COPD, smoker, anxiety.  Clinical Impression  Patient presents with pain and post surgical deficits s/p above surgery. Pt lives with nephew and reports being independent for ADLs/IADLs and ambulation PTA. Today, pt tolerated bed mobility, transfers and gait training with Min guard-supervision for safety. Pt mildly unsteady during gait using rail for support at times. Might benefit from DME pending improvement. Education re: back precautions, handout, log roll technique, positioning, brace wear schedule etc. Will follow acutely to maximize independence and mobility prior to return home.    Follow Up Recommendations No PT follow up    Equipment Recommendations  None recommended by PT    Recommendations for Other Services       Precautions / Restrictions Precautions Precautions: Back Precaution Booklet Issued: Yes (comment) Precaution Comments: Reviewed back precautions and handout Required Braces or Orthoses: Spinal Brace Spinal Brace: Applied in sitting position Restrictions Weight Bearing Restrictions: No      Mobility  Bed Mobility Overal bed mobility: Needs Assistance Bed Mobility: Rolling;Sidelying to Sit;Sit to Sidelying Rolling: Supervision Sidelying to sit: Supervision     Sit to sidelying: Supervision General bed mobility comments: HOB flat, no use of rails to simulate home. Cues for log roll technique.    Transfers Overall transfer level: Needs assistance Equipment used: None Transfers: Sit to/from Stand Sit to Stand: Min guard         General transfer comment: Min guard for safety. Stood from EOB x1, slow to rise.  Ambulation/Gait Ambulation/Gait assistance: Min guard Gait Distance (Feet): 150  Feet Assistive device: None (rail PRN) Gait Pattern/deviations: Step-through pattern;Decreased stride length;Drifts right/left Gait velocity: decreased   General Gait Details: Slow, mildly unsteady gait which improved with distance; reaching for rail for support at times. Might benefit from RW?  Stairs            Wheelchair Mobility    Modified Rankin (Stroke Patients Only)       Balance Overall balance assessment: Needs assistance Sitting-balance support: Feet supported;No upper extremity supported Sitting balance-Leahy Scale: Good Sitting balance - Comments: ASsist to donn brace sitting EOB.   Standing balance support: During functional activity Standing balance-Leahy Scale: Fair Standing balance comment: Min guard for safety in standing.                             Pertinent Vitals/Pain Pain Assessment: 0-10 Pain Score: 7  Pain Location: back Pain Descriptors / Indicators: Sore;Operative site guarding Pain Intervention(s): Monitored during session;Repositioned;Limited activity within patient's tolerance    Home Living Family/patient expects to be discharged to:: Private residence Living Arrangements: Other relatives (nephew) Available Help at Discharge: Family;Available 24 hours/day Type of Home: House Home Access: Stairs to enter Entrance Stairs-Rails: Right Entrance Stairs-Number of Steps: 2 Home Layout: One level Home Equipment: None      Prior Function Level of Independence: Independent         Comments: Independent for ADLs/IADLs and ambulation PTA. Drives. No falls reported.     Hand Dominance        Extremity/Trunk Assessment   Upper Extremity Assessment Upper Extremity Assessment: Defer to OT evaluation    Lower Extremity Assessment Lower Extremity Assessment: RLE deficits/detail RLE Deficits / Details: Grossly ~4/5 throughout,  sensation WFL. RLE Sensation: WNL    Cervical / Trunk Assessment Cervical / Trunk  Assessment: Other exceptions Cervical / Trunk Exceptions: s/p spine surgery  Communication   Communication: No difficulties  Cognition Arousal/Alertness: Awake/alert Behavior During Therapy: WFL for tasks assessed/performed Overall Cognitive Status: Within Functional Limits for tasks assessed                                        General Comments General comments (skin integrity, edema, etc.): Incisions clean.    Exercises     Assessment/Plan    PT Assessment Patient needs continued PT services  PT Problem List Decreased strength;Decreased mobility;Pain;Decreased balance;Decreased skin integrity;Decreased knowledge of precautions       PT Treatment Interventions Therapeutic exercise;Gait training;Balance training;Patient/family education;Therapeutic activities;Functional mobility training;Stair training;DME instruction    PT Goals (Current goals can be found in the Care Plan section)  Acute Rehab PT Goals Patient Stated Goal: to go home PT Goal Formulation: With patient Time For Goal Achievement: 04/03/20 Potential to Achieve Goals: Good    Frequency Min 5X/week   Barriers to discharge Inaccessible home environment stairs    Co-evaluation               AM-PAC PT "6 Clicks" Mobility  Outcome Measure Help needed turning from your back to your side while in a flat bed without using bedrails?: None Help needed moving from lying on your back to sitting on the side of a flat bed without using bedrails?: None Help needed moving to and from a bed to a chair (including a wheelchair)?: A Little Help needed standing up from a chair using your arms (e.g., wheelchair or bedside chair)?: A Little Help needed to walk in hospital room?: A Little Help needed climbing 3-5 steps with a railing? : A Little 6 Click Score: 20    End of Session Equipment Utilized During Treatment: Back brace Activity Tolerance: Patient tolerated treatment well;Patient limited by  pain Patient left: in bed;with call bell/phone within reach Nurse Communication: Mobility status PT Visit Diagnosis: Pain;Unsteadiness on feet (R26.81) Pain - part of body:  (back)    Time: 5110-2111 PT Time Calculation (min) (ACUTE ONLY): 17 min   Charges:   PT Evaluation $PT Eval Moderate Complexity: 1 Mod          Marisa Severin, PT, DPT Acute Rehabilitation Services Pager [phone] Office [phone]      Marguarite Arbour A Sabra Heck 03/20/2020, 2:48 PM

## 2020-03-20 NOTE — Anesthesia Postprocedure Evaluation (Signed)
Anesthesia Post Note  Patient: Tanya Velazquez  Procedure(s) Performed: MINIMALLY INVASIVE (MIS) TRANSFORAMINAL LUMBAR INTERBODY FUSION (TLIF) LUMBAR FOUR-LUMBAR FIVE, RIGHT (N/A )     Patient location during evaluation: PACU Anesthesia Type: General Level of consciousness: awake and alert Pain management: pain level controlled Vital Signs Assessment: post-procedure vital signs reviewed and stable Respiratory status: spontaneous breathing, nonlabored ventilation, respiratory function stable and patient connected to nasal cannula oxygen Cardiovascular status: blood pressure returned to baseline and stable Postop Assessment: no apparent nausea or vomiting Anesthetic complications: no   No complications documented.  Last Vitals:  Vitals:   03/20/20 1154 03/20/20 1539  BP: 129/68 98/88  Pulse: 70 81  Resp: 18 18  Temp: (!) 36.4 C 36.7 C  SpO2: 99% 97%    Last Pain:  Vitals:   03/20/20 1725  TempSrc:   PainSc: 8                  [name]

## 2020-03-20 NOTE — Progress Notes (Signed)
1. backpain Tanya Velazquez returns to clinic for follow-up. She reports over the interim, her back and right leg pain have significantly worsened. I had prescribed formal physical therapy, but due to a COVID outbreak, her physical therapy was canceled. However, she has continued performing physical therapies and stretching exercises at home. She does smoke 5 cigarettes per day. MEDICATIONS: (added, continued or stopped this visit) Sta rted Medication gabapentin 300 mg capsule Linzess 290 mcg capsule mirtazapine 7.5 mg tablet oxycodone 10 mg tablet pravastatin quetiapine 400 mg tablet Trelegy Ellipta 100 mcg-62.5 mcg-25 mcg powder for inhalation Directions Instruction Admire, [place] [phone] 1960/12/28 03/05/2020 10:15 AM Page: 1/6  02/06/2020 ALLERGIES: Ingredient PENICILLINS venlafaxine ER 150 mg capsu le,extended release 24 hr Reaction take 1 capsule by oral route every day Medication Name Comment  Reviewed, no changes. PHYSICAL EXAM: Vitals Date Temp F 03/05/2020 BP 110/74 Pulse 96 Ht In 62 Left Wt Lb BMI 147 26.89 BSA Pain Score 8/10   PHYSICAL EXAM Details General Level of Distress: Overall Appearance: Head and Face Fundoscopic Exam: Cardiovascular<BR >Peripheral Pulses: Carotid Pulses: Respiratory Lungs: Neurological Orientation: Recent and Remote Memory: Attention Span and Concentration: Language: Fund of Knowledge: Sensation: Upper Extremity Coordination: Lower Extremity Coordination: normal normal normal normal normal Right normal normal normal Left normal normal normal no acute distress Normal Right unable to visualize unable to visua lize Right normal non-labored Left normal normal Tanya Velazquez, Tanya Velazquez [id number] 1960-08-27 03/05/2020 10:15 AM Page: 2/6  Musculoskeletal Gait and Station: Upper Extremity Muscle Tone: Lower Extremity Muscle Tone: Motor Strength normal Right normal normal Left . Any abnormal findings will  be noted below. Right Deltoid: 5/5 Biceps: 5/5 Triceps: 5/5 Wrist Extensor: 5/5 Grip: 5/5 Finge r Extensor: 5/5 Hip Flexor: 5/5 Knee Extensor: 5/5 Knee Flexor: 5/5 Tib Anterior: 4+/5 EHL: 5/5 Medial Gastroc: 5/5 Gaze Normal Horizontal Gaze Stability: Deep Tendon Reflexes Right Biceps: 2+ Brachioradialis: 2+ Patellar: 2+ Achilles: 2+ Sensory Sensation was tested at C2 Cranial Nerves II. Optic Nerve/Visual Fields: III. Oculomotor: IV. Trochlear: V. Trigeminal: normal Left 5/5 5/5 5/5 5/5 5/5 5/ 5 5/5 5/5 5/5 5/5 5/5 5/5 Left 2+ 2+ 2+ 2+ to T1 and L1 to S1. normal EOMI EOMI sensory intact EOMI VI. Abducens: VII. Facial: VIII. Acoustic/Vestibular: IX. Glossopharyngeal: X. Vagus: Tanya Velazquez, Tanya Velazquez [id number] [birth year] 03/05/2020 10:15 AM Page: 3/6 no facial droop hearing intact palate elevation symmetric no hoarseness  XI. Spinal Accessory: XII. Hypoglossal: Motor and othe r Tests SLR: shoulder shrug full tongue protrusion midline Right Left positive negative DIAGNOSTIC RESULTS: X-ray of her lumbar spine shows mild L4-5 spondylolisthesis. There is asymmetric disc space collapse with severe foraminal stenosis at right L4-5. There is no overt movement on flexion-extension. IMPRESSION: This is a 60 year old woman with right leg radiculopathy related to severe L4-5 foraminal stenosis,  with asymmetric disc height collapse and mild spondylolisthesis. PLAN: I had a long discussion with patient regarding treatment options. The option of injections, and continued physical therapy were discussed with her. However, given her symptoms are worsening and the severe stenosis, I also discussed with her the option of a minimally invasive right L4-5 TLIF. Risks, benefits, alternatives, expected convalescence were discussed w ith her. She wished to proceed. All questions and concerns were answered.

## 2020-03-20 NOTE — Op Note (Signed)
PREOP DIAGNOSIS: L4-5 spondylolisthesis with right radiculopathy  POSTOP DIAGNOSIS: L4-5 spondylolisthesis with right radiculopathy  PROCEDURE: 1. Right L4-5 lumbar interbody fusion via transforaminal approach with tubular retractors 2. Placement of expandable interbody cage at L4-5 3. Nonsegmental instrumentation with percutaneously placed pedicle screw and rod construct at L4-5 4. Harvest of local autograft 5. Use of morselized allograft 6. Use of microscope for microdissection  SURGEON: Dr. Duffy Rhody, MD  ASSISTANT: Elwin Sleight, DO. Please note, there were no qualified trainees available to assist with the procedure.  An assistant was required for aid in retraction of the neural elements.   ANESTHESIA: General Endotracheal  EBL: 100 ml  IMPLANTS:  [employer]tr[employer] 6.5 x 45 mm screws at L4, 6.5 x 40 mm screws at L5 18mm/40mm rods 7-14 x 26 mm expandable cage xs BMP  SPECIMENS: None  DRAINS: None  COMPLICATIONS: none  CONDITION: Stable to PACU  HISTORY: Tanya Velazquez is a 60 y.o. female who initially presented to the outpatient clinic with back pain and severe right leg pain.  MRI showed severe right foraminal stenosis at L4-5 with asymmetric disc space collapse and retrolisthesis of L4 on L5.  Nonsurgical measures failed to improve her pain which was worsening.  Treatment options were discussed and the patient elected to proceed with MIS TLIF at L4-5. risks, benefits, alternatives, and expected convalescence were discussed with the patient.  Risks discussed included but were not limited to bleeding, pain, infection, scar, pseudoarthrosis, adjacent segment degeneration, CSF leak, neurologic deficit, paralysis, and death.  The patient wished to proceed with surgery and informed consent was obtained.  PROCEDURE IN DETAIL: After informed consent was obtained and witnessed, the patient was brought to the operating room. After induction of general anesthesia, the patient was  positioned on the operative table in the prone position on a Jackson table with all pressure points meticulously padded. The skin of the low back was then prepped and draped in the usual sterile fashion.  Under fluoroscopy, the pedicles of the correct levels were identified and marked out on the skin, and after timeout was conducted, the skin was infiltrated with local anesthetic.  A 10 blade was used to incise the skin and the fascia overlying the pedicles on AP fluoroscopy.  Jamshidi's were then placed at the lateral aspect of the pedicle is identified AP fluoroscopy and malleted into the body under x-ray guidance.  C-arm was then switched to lateral which confirmed entry into the body.  K wires were then passed through the Jamshidi's and the sheaths were removed.  On the contralateral side from the TLIF, the pedicle path was tapped and then 6.5 mm sized percutaneous pedicle screws were passed over the K wire and placed under fluoroscopic guidance.  There was good purchase noted.  On the ipsilateral side of the TLIF, the K wires were kept in place.  The tubular retractor system was then docked on the right L4-5 facet joint under fluoroscopic guidance.  Sequential dilators and final tubular retractor was placed and locked in position under fluoroscopic guidance.  The microscope was then introduced in the field to allow for intraoperative microdissection.  Remaining muscle was moved off of the degenerated facet joint.  The inferior facets was removed using an osteotome and harvested for autograft.  Overgrown superior facets was then removed with rongeurs.  There was a fair amount of degenerative material in the foramen which was removed with rongeurs.  The traversing nerve root was identified and good decompression was performed with removal  of overgrown ligament and facets with rongeurs.  The exiting nerve root in the foramen was also seen after removal of the degenerative material in the foramen and good  decompression was confirmed with easy passage of nerve hook.  Epidural veins in the foramen were coagulated and cut with allowed access to the disc space lateral to the traversing nerve root.  The traversing nerve root was gently retracted medially to allow greater access to the disc.  The disc was opened with 15 blade and pituitary rongeur was used to remove disc material.  Disc shavers of increasing size was then used to clean the disc space as well as to restore disc space height.  The interbody space was prepared with rasps and curettes with removal of the cartilage from the endplates.  Appropriate sized interbody spacer was then sized.  The interbody space was packed with morselized allograft and BMP and mixed with autograft.  An expandable size 7-14 mm x 26 mm interbody spacer was then tamped into place with a mallet under x-ray guidance.  It was then expanded until snug with the endplates.  AP and lateral x-ray confirmed good position of the implant.  The wound was then irrigated thoroughly with bacitracin impregnated irrigation and meticulous hemostasis was obtained.  The retractor was then removed and meticulous hemostasis was obtained in the muscle layer and subcutaneous layer.  On the ipsilateral side, similar to before, the pedicles were tapped over the K wires and size 6.5 mm pedicle screws were placed at each level under fluoroscopic guidance.  Good purchase was noted.  Rods were then passed through the screw towers bilaterally and secured with screw caps.  X-ray confirmed good placement.  The screw caps were final tightened and the towers removed.  Wounds were irrigated thoroughly with bacitracin impregnated irrigation.  Exparel mixed with Marcaine was injected into the paraspinous muscles and subcutaneous tissues bilaterally.  The fascia was closed with 0 Vicryl stitches.  The dermal layer was closed with 2-0 Vicryl stitches in buried interrupted fashion.  The skin incisions were closed with 4-0  Monocryl subcuticular manner followed by Dermabond.  Sterile dressings were placed.  Patient was then flipped supine and extubated by the anesthesia service following commands and all 4 extremities.  All counts were correct at the end of surgery.  No complications were noted.

## 2020-03-21 LAB — GLUCOSE, CAPILLARY
Glucose-Capillary: 143 mg/dL — ABNORMAL HIGH (ref 70–99)
Glucose-Capillary: 99 mg/dL (ref 70–99)

## 2020-03-21 MED ORDER — DOCUSATE SODIUM 100 MG PO CAPS: 100.0000 mg | ORAL_CAPSULE | Freq: Two times a day (BID) | ORAL | 2 refills | Status: AC

## 2020-03-21 MED ORDER — OXYCODONE-ACETAMINOPHEN 5-325 MG PO TABS: 1.0000 | ORAL_TABLET | ORAL | 0 refills | Status: DC | PRN

## 2020-03-21 MED ORDER — OXYCODONE-ACETAMINOPHEN 5-325 MG PO TABS
1.0000 | ORAL_TABLET | ORAL | Status: DC | PRN
  Administered 2020-03-21 (×2): 2 via ORAL
  Filled 2020-03-21 (×2): qty 2

## 2020-03-21 MED ORDER — CYCLOBENZAPRINE HCL 10 MG PO TABS: 10.0000 mg | ORAL_TABLET | Freq: Three times a day (TID) | ORAL | 0 refills | Status: DC | PRN

## 2020-03-21 MED FILL — Thrombin (Recombinant) For Soln 5000 Unit: CUTANEOUS | Qty: 5000 | Status: AC

## 2020-03-21 NOTE — Progress Notes (Signed)
Physical Therapy Treatment Patient Details Name: Tanya Velazquez MRN: [id number] DOB: 1960-07-14 Today's Date: 03/21/2020    History of Present Illness Patient is a 59 y/o female who presents s/p Right L4-5 TLIF 03/19/20. PMH includes HTN, DM, migraines, COPD, smoker, anxiety.    PT Comments    Pt tolerates treatment well although ambulation distance remains limited. Pt demonstrates good recall of back precautions and is able to perform log roll and manage back brace without physical assistance. Pt demonstrates improved ambulation quality without use of railing at this time. Pt declines stair training at this time, but reports only one step to enter and a railing is present. Pt will benefit from continued acute PT services to improve activity tolerance and to restore independence. PT recommends no PT or DME needs at the time of discharge.   Follow Up Recommendations  No PT follow up     Equipment Recommendations  None recommended by PT    Recommendations for Other Services       Precautions / Restrictions Precautions Precautions: Back Precaution Booklet Issued: Yes (comment) Precaution Comments: pt recalls 3/3 back precautions Required Braces or Orthoses: Spinal Brace Spinal Brace: Lumbar corset;Applied in sitting position Restrictions Weight Bearing Restrictions: No    Mobility  Bed Mobility Overal bed mobility: Needs Assistance Bed Mobility: Rolling;Sidelying to Sit;Sit to Sidelying Rolling: Supervision Sidelying to sit: Supervision     Sit to sidelying: Supervision General bed mobility comments: HOB flat +rail. Supervison A for log rolling techniue. Patient with trunk slightly rotated at rest. Cues for positioning in supine with adherence to back precautions. Patient expressed verbal understanding.    Transfers Overall transfer level: Independent Equipment used: None Transfers: Sit to/from Stand Sit to Stand: Independent         General transfer comment:  Supervision A for safety. No AD.  Ambulation/Gait Ambulation/Gait assistance: Supervision Gait Distance (Feet): 150 Feet Assistive device: None Gait Pattern/deviations: Step-through pattern Gait velocity: decreased Gait velocity interpretation: <1.8 ft/sec, indicate of risk for recurrent falls General Gait Details: pt with slowed step-through gait, briefly reaches for railing initiall but does not utilize UE support for majority of mobility   Stairs Stairs:  (pt declines stair negotiation)           Wheelchair Mobility    Modified Rankin (Stroke Patients Only)       Balance Overall balance assessment: Needs assistance Sitting-balance support: No upper extremity supported;Feet supported Sitting balance-Leahy Scale: Good     Standing balance support: No upper extremity supported;During functional activity Standing balance-Leahy Scale: Good Standing balance comment: Patient able to maintain static standing balance without UE support during ADLs. Able to don lumbar corset in standing without LOB.                            Cognition Arousal/Alertness: Awake/alert Behavior During Therapy: WFL for tasks assessed/performed Overall Cognitive Status: Within Functional Limits for tasks assessed                                        Exercises      General Comments General comments (skin integrity, edema, etc.): VSS on RA      Pertinent Vitals/Pain Pain Assessment: 0-10 Pain Score: 7  Pain Location: back Pain Descriptors / Indicators: Sore Pain Intervention(s): Limited activity within patient's tolerance    Home Living Family/patient expects  to be discharged to:: Private residence Living Arrangements: Other relatives (nephew) Available Help at Discharge: Family;Available 24 hours/day Type of Home: House Home Access: Stairs to enter Entrance Stairs-Rails: Right Home Layout: One level Home Equipment: None      Prior Function Level of  Independence: Independent      Comments: Independent for ADLs/IADLs and ambulation PTA. Drives. No falls reported.   PT Goals (current goals can now be found in the care plan section) Acute Rehab PT Goals Patient Stated Goal: To return home. Progress towards PT goals: Progressing toward goals    Frequency    Min 5X/week      PT Plan Current plan remains appropriate    Co-evaluation              AM-PAC PT "6 Clicks" Mobility   Outcome Measure  Help needed turning from your back to your side while in a flat bed without using bedrails?: A Little Help needed moving from lying on your back to sitting on the side of a flat bed without using bedrails?: A Little Help needed moving to and from a bed to a chair (including a wheelchair)?: None Help needed standing up from a chair using your arms (e.g., wheelchair or bedside chair)?: None Help needed to walk in hospital room?: A Little Help needed climbing 3-5 steps with a railing? : A Little 6 Click Score: 20    End of Session Equipment Utilized During Treatment: Back brace Activity Tolerance: Patient tolerated treatment well;Patient limited by pain Patient left: in bed;with call bell/phone within reach Nurse Communication: Mobility status PT Visit Diagnosis: Pain;Unsteadiness on feet (R26.81) Pain - part of body:  (back)     Time: 5631-4970 PT Time Calculation (min) (ACUTE ONLY): 8 min  Charges:  $Gait Training: 8-22 mins                     Zenaida Niece, PT, DPT Acute Rehabilitation Pager: [phone]    Zenaida Niece 03/21/2020, 9:16 AM

## 2020-03-21 NOTE — Discharge Summary (Signed)
Physician Discharge Summary  Patient ID: Tanya Velazquez MRN: [id number] DOB/AGE: 1960-04-04 60 y.o.  Admit date: 03/20/2020 Discharge date: 03/21/2020  Admission Diagnoses:  Lumbar radiculopathy  Discharge Diagnoses:  Same Active Problems:   Lumbar radiculopathy   Discharged Condition: Stable  Hospital Course:  Tanya Velazquez is a 60 y.o. female who had progressive and intractable right leg pain and was found to have severe foraminal stenosis at L4-5 with associated retrolisthesis of L4 on L5 and asymmetric disc space collapse.  She underwent elective L4-5 minimally invasive TLIF and was admitted to the spine unit postoperatively.  On postop day #1, she was tolerating a regular diet, ambulating without difficulty and her pain was well controlled.  She was voiding without difficulty as well.  Physical therapy and Occupational Therapy cleared her for discharge.  She was deemed ready for discharge home.   Treatments: Surgery -right L4-5 MIS TLIF  Discharge Exam: Blood pressure 112/64, pulse 79, temperature 98.5 F (36.9 C), temperature source Oral, resp. rate 19, height 5\' 2"  (1.575 m), weight 68 kg, SpO2 95 %. Awake, alert, oriented Speech fluent, appropriate CN grossly intact 5/5 BUE/BLE Wounds c/d/i  Disposition: Discharge disposition: 01-Home or Self Care       Discharge Instructions    Incentive spirometry RT   Complete by: As directed      Allergies as of 03/21/2020      Reactions   Amicinonide-benzyl Alcohol [cyclocort] Rash   Amoxicillin Rash      Doxycycline Rash   Paxil [paroxetine Hcl] Rash   Penicillins Rash   Reaction: 7 years   Sulfa Antibiotics Rash   Trimethoprim Rash      Medication List    STOP taking these medications   acetaminophen 325 MG tablet Commonly known as: TYLENOL   meloxicam 7.5 MG tablet Commonly known as: MOBIC   oxyCODONE 5 MG immediate release tablet Commonly known as: Oxy IR/ROXICODONE     TAKE these medications    buPROPion 150 MG 12 hr tablet Commonly known as: WELLBUTRIN SR Take 150 mg by mouth at bedtime.   busPIRone 15 MG tablet Commonly known as: BUSPAR Take 15 mg by mouth 2 (two) times daily.   cyanocobalamin 1000 MCG tablet Take 1 tablet (1,000 mcg total) by mouth 2 (two) times daily.   cyclobenzaprine 10 MG tablet Commonly known as: FLEXERIL Take 1 tablet (10 mg total) by mouth 3 (three) times daily as needed for muscle spasms.   docusate sodium 100 MG capsule Commonly known as: COLACE Take 1 capsule (100 mg total) by mouth 2 (two) times daily.   famotidine 20 MG tablet Commonly known as: PEPCID Take 1 tablet (20 mg total) by mouth daily.   gabapentin 300 MG capsule Commonly known as: NEURONTIN Take 300 mg by mouth 3 (three) times daily.   hydrocortisone 2.5 % rectal cream Commonly known as: ANUSOL-HC Place 1 application rectally 2 (two) times daily. What changed:   when to take this  reasons to take this   ipratropium-albuterol 0.5-2.5 (3) MG/3ML Soln Commonly known as: DUONEB Inhale 3 mLs into the lungs every 6 (six) hours as needed for wheezing or shortness of breath.   linaclotide 290 MCG Caps capsule Commonly known as: Linzess Take 1 capsule (290 mcg total) by mouth daily before breakfast. What changed: when to take this   mirtazapine 7.5 MG tablet Commonly known as: REMERON Take 7.5 mg by mouth at bedtime.   oxyCODONE-acetaminophen 5-325 MG tablet Commonly known as: PERCOCET/ROXICET Take  1-2 tablets by mouth every 4 (four) hours as needed for moderate pain or severe pain.   pravastatin 20 MG tablet Commonly known as: PRAVACHOL Take 20 mg by mouth in the morning.   ProAir HFA 108 (90 Base) MCG/ACT inhaler Generic drug: albuterol Inhale 1 puff into the lungs every 4 (four) hours as needed for wheezing or shortness of breath.   QUEtiapine 400 MG tablet Commonly known as: SEROQUEL Take 1 tablet (400 mg total) by mouth at bedtime. What changed: how  much to take   Trelegy Ellipta 100-62.5-25 MCG/INH Aepb Generic drug: Fluticasone-Umeclidin-Vilant Inhale 1 puff into the lungs daily.   Venlafaxine HCl 150 MG Tb24 Take 150 mg by mouth daily with breakfast.   Vitamin D3 50 MCG (2000 UT) Tabs Take 2,000 Units by mouth in the morning.        Signed: Vallarie Mare 03/21/2020, 9:46 AM

## 2020-03-21 NOTE — Evaluation (Signed)
Occupational Therapy Evaluation Patient Details Name: Tanya Velazquez MRN: [id number] DOB: 08-11-1960 Today's Date: 03/21/2020    History of Present Illness Patient is a 60 y/o female who presents s/p Right L4-5 TLIF 03/19/20. PMH includes HTN, DM, migraines, COPD, smoker, anxiety.   Clinical Impression   PTA patient was living with her nephew and his girlfriend and was independent with ADLs/IADLs without AD. Patient currently presents near baseline level of function demonstrating observed ADLs with Mod I to supervision A for safety and occasional cues for adherence to precautions. OT reviewed education on spinal precautions and provided education on home set-up to maximize safety and independence with self-care tasks, and acquisition/use of AE. Patient expressed verbal understanding. Patient also able to don aspen lumbar brace without external assist. Patient does not require continued acute occupational therapy services with OT to sign off at this time. Patient to return home with assist from family as needed.     Follow Up Recommendations  No OT follow up;Supervision - Intermittent    Equipment Recommendations  3 in 1 bedside commode    Recommendations for Other Services       Precautions / Restrictions Precautions Precautions: Back Precaution Booklet Issued: Yes (comment) Precaution Comments: Reviewed back precautions and handout Required Braces or Orthoses: Spinal Brace Spinal Brace: Applied in sitting position Restrictions Weight Bearing Restrictions: No      Mobility Bed Mobility Overal bed mobility: Needs Assistance Bed Mobility: Rolling;Sidelying to Sit Rolling: Supervision Sidelying to sit: Supervision       General bed mobility comments: HOB flat +rail. Supervison A for log rolling techniue. Patient with trunk slightly rotated at rest. Cues for positioning in supine with adherence to back precautions. Patient expressed verbal understanding.    Transfers Overall  transfer level: Needs assistance Equipment used: None Transfers: Sit to/from Stand Sit to Stand: Supervision         General transfer comment: Supervision A for safety. No AD.    Balance Overall balance assessment: Needs assistance Sitting-balance support: Feet supported;No upper extremity supported Sitting balance-Leahy Scale: Good     Standing balance support: During functional activity Standing balance-Leahy Scale: Fair Standing balance comment: Patient able to maintain static standing balance without UE support during ADLs. Able to don lumbar corset in standing without LOB.                           ADL either performed or assessed with clinical judgement   ADL Overall ADL's : Needs assistance/impaired                 Upper Body Dressing : Set up;Sitting   Lower Body Dressing: Supervision/safety;Sit to/from stand Lower Body Dressing Details (indicate cue type and reason): Supervision A for safety. Patient with good adherence to back precautions. Toilet Transfer: Copy Details (indicate cue type and reason): Simulated with transfer to recliner without AD.         Functional mobility during ADLs: Supervision/safety       Vision Baseline Vision/History: Wears glasses Wears Glasses: Reading only Patient Visual Report: No change from baseline       Perception     Praxis      Pertinent Vitals/Pain Pain Assessment: 0-10 Pain Score: 7  Pain Location: back Pain Descriptors / Indicators: Sore;Operative site guarding Pain Intervention(s): Limited activity within patient's tolerance;Monitored during session;Repositioned;Premedicated before session     Hand Dominance Right   Extremity/Trunk Assessment Upper Extremity Assessment Upper Extremity  Assessment: Overall WFL for tasks assessed   Lower Extremity Assessment Lower Extremity Assessment: Defer to PT evaluation   Cervical / Trunk Assessment Cervical / Trunk  Assessment: Other exceptions Cervical / Trunk Exceptions: s/p spinal surgery   Communication Communication Communication: No difficulties   Cognition Arousal/Alertness: Awake/alert Behavior During Therapy: WFL for tasks assessed/performed Overall Cognitive Status: Within Functional Limits for tasks assessed                                     General Comments  Incision open to RA. No drainage noted.    Exercises     Shoulder Instructions      Home Living Family/patient expects to be discharged to:: Private residence Living Arrangements: Other relatives (nephew) Available Help at Discharge: Family;Available 24 hours/day Type of Home: House Home Access: Stairs to enter [employer] of Steps: 2 Entrance Stairs-Rails: Right Home Layout: One level     Bathroom Shower/Tub: Teacher, early years/pre: Standard     Home Equipment: None          Prior Functioning/Environment Level of Independence: Independent        Comments: Independent for ADLs/IADLs and ambulation PTA. Drives. No falls reported.        OT Problem List: Pain      OT Treatment/Interventions:      OT Goals(Current goals can be found in the care plan section) Acute Rehab OT Goals Patient Stated Goal: To return home. OT Goal Formulation: With patient Time For Goal Achievement: 04/04/20 Potential to Achieve Goals: Good  OT Frequency:     Barriers to D/C:            Co-evaluation              AM-PAC OT "6 Clicks" Daily Activity     Outcome Measure Help from another person eating meals?: None Help from another person taking care of personal grooming?: A Little Help from another person toileting, which includes using toliet, bedpan, or urinal?: A Little Help from another person bathing (including washing, rinsing, drying)?: A Little Help from another person to put on and taking off regular upper body clothing?: A Little Help from another person to put  on and taking off regular lower body clothing?: A Little 6 Click Score: 19   End of Session Nurse Communication: Mobility status  Activity Tolerance: Patient tolerated treatment well Patient left: in chair;with call bell/phone within reach  OT Visit Diagnosis: Muscle weakness (generalized) (M62.81)                Time: 0722-0733 OT Time Calculation (min): 11 min Charges:  OT General Charges $OT Visit: 1 Visit OT Evaluation $OT Eval Low Complexity: 1 Low  Carmine Youngberg H. OTR/L Supplemental OT, Department of rehab services [phone]  Debroh Sieloff R H. 03/21/2020, 8:36 AM

## 2020-03-21 NOTE — Progress Notes (Signed)
Pt doing well. Pt given D/C instructions with verbal understanding. Rx's were given to the Pt prior to D/C. Pt's incision is clean and dry with no sign of infection. Pt's IV was removed prior to D/C. Pt D/C'd home via wheelchair per MD order. Pt is stable @ D/C and has no other needs at this time. Holli Humbles, RN

## 2020-03-22 ENCOUNTER — Encounter (HOSPITAL_COMMUNITY): Payer: Self-pay | Admitting: Neurosurgery

## 2020-04-19 ENCOUNTER — Ambulatory Visit: Payer: Medicare Other | Admitting: Nurse Practitioner

## 2020-06-07 ENCOUNTER — Ambulatory Visit: Payer: Medicare Other | Admitting: Gastroenterology

## 2020-07-24 ENCOUNTER — Other Ambulatory Visit (HOSPITAL_COMMUNITY): Payer: Self-pay | Admitting: Family Medicine

## 2020-07-24 DIAGNOSIS — Z1231 Encounter for screening mammogram for malignant neoplasm of breast: Secondary | ICD-10-CM

## 2020-08-01 ENCOUNTER — Ambulatory Visit (HOSPITAL_COMMUNITY): Payer: Medicare Other

## 2020-08-28 ENCOUNTER — Telehealth: Payer: Self-pay

## 2020-08-28 NOTE — Telephone Encounter (Signed)
Called LMOM per Dr Posey Pronto, [facility] will not take this pt as PCP

## 2020-10-11 ENCOUNTER — Ambulatory Visit: Payer: Medicare Other | Admitting: Gastroenterology

## 2020-10-17 ENCOUNTER — Other Ambulatory Visit (HOSPITAL_COMMUNITY): Payer: Self-pay | Admitting: Family Medicine

## 2020-10-17 DIAGNOSIS — Z1231 Encounter for screening mammogram for malignant neoplasm of breast: Secondary | ICD-10-CM

## 2020-10-25 ENCOUNTER — Ambulatory Visit (HOSPITAL_COMMUNITY): Payer: Medicare Other

## 2020-10-31 ENCOUNTER — Ambulatory Visit (HOSPITAL_COMMUNITY): Payer: Medicare Other

## 2021-01-10 ENCOUNTER — Other Ambulatory Visit: Payer: Self-pay | Admitting: Gastroenterology

## 2021-02-13 ENCOUNTER — Emergency Department (HOSPITAL_COMMUNITY)
Admission: EM | Admit: 2021-02-13 | Discharge: 2021-02-14 | Disposition: A | Discharge status: Home / Self Care | Payer: 59 | Attending: Emergency Medicine | Admitting: Emergency Medicine

## 2021-02-13 ENCOUNTER — Other Ambulatory Visit: Payer: Self-pay

## 2021-02-13 ENCOUNTER — Emergency Department (HOSPITAL_COMMUNITY): Payer: 59

## 2021-02-13 ENCOUNTER — Encounter (HOSPITAL_COMMUNITY): Payer: Self-pay

## 2021-02-13 DIAGNOSIS — M545 Low back pain, unspecified: Secondary | ICD-10-CM | POA: Diagnosis present

## 2021-02-13 DIAGNOSIS — M5441 Lumbago with sciatica, right side: Secondary | ICD-10-CM | POA: Insufficient documentation

## 2021-02-13 DIAGNOSIS — M5431 Sciatica, right side: Secondary | ICD-10-CM

## 2021-02-13 MED ORDER — KETOROLAC TROMETHAMINE 30 MG/ML IJ SOLN
15.0000 mg | Freq: Once | INTRAMUSCULAR | Status: AC
  Administered 2021-02-13: 15 mg via INTRAMUSCULAR
  Filled 2021-02-13: qty 1

## 2021-02-13 NOTE — ED Triage Notes (Signed)
Pt presents to ED with complaints of right lower back pain radiating down right leg x couple of weeks.

## 2021-02-13 NOTE — ED Provider Notes (Signed)
[facility] EMERGENCY DEPARTMENT Provider Note   CSN: [id number] Arrival date & time: 02/13/21  1539     History  Chief Complaint  Patient presents with   Back Pain    Tanya Velazquez is a 61 y.o. female.  HPI She complains of a fall last night aggravating, chronic back pain.  The pain is in her right low back and radiates to her right thigh.  She has a history of prior back surgery.  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  She denies change in bowel or bladder function.    Home Medications Prior to Admission medications   Medication Sig Start Date End Date Taking? Authorizing Provider  predniSONE (DELTASONE) 20 MG tablet Take 1 tablet (20 mg total) by mouth 2 (two) times daily. 02/14/21  Yes Daleen Bo, MD  buPROPion [facility] SR) 150 MG 12 hr tablet Take 150 mg by mouth at bedtime.  05/27/19   [provider]  busPIRone (BUSPAR) 15 MG tablet Take 15 mg by mouth 2 (two) times daily.    [provider]  Cholecalciferol (VITAMIN D3) 50 MCG (2000 UT) TABS Take 2,000 Units by mouth in the morning.     [provider]  cyanocobalamin 1000 MCG tablet Take 1 tablet (1,000 mcg total) by mouth 2 (two) times daily.    [provider]  cyclobenzaprine (FLEXERIL) 10 MG tablet Take 1 tablet (10 mg total) by mouth 3 (three) times daily as needed for muscle spasms. 03/21/20   Vallarie Mare, MD  docusate sodium (COLACE) 100 MG capsule Take 1 capsule (100 mg total) by mouth 2 (two) times daily. 03/21/20   Vallarie Mare, MD  famotidine (PEPCID) 20 MG tablet Take 1 tablet (20 mg total) by mouth daily. Patient not taking: No sig reported 06/13/19   Barton Dubois, MD  gabapentin (NEURONTIN) 300 MG capsule Take 300 mg by mouth 3 (three) times daily.     [provider]  hydrocortisone (ANUSOL-HC) 2.5 % rectal cream Place 1 application rectally 2 (two) times daily. Patient taking differently: Place 1 application rectally 2 (two)  times daily as needed for hemorrhoids (discomfort). 02/04/19   Erenest Rasher, PA-C  ipratropium-albuterol (DUONEB) 0.5-2.5 (3) MG/3ML SOLN Inhale 3 mLs into the lungs every 6 (six) hours as needed for wheezing or shortness of breath. 03/10/19   [provider]  LINZESS 290 MCG CAPS capsule TAKE 1 CAPSULE BY MOUTH ONCE DAILY BEFORE BREAKFAST. 01/10/21   Mahala Menghini, PA-C  mirtazapine (REMERON) 7.5 MG tablet Take 7.5 mg by mouth at bedtime. 03/12/20   [provider]  oxyCODONE-acetaminophen (PERCOCET/ROXICET) 5-325 MG tablet Take 1-2 tablets by mouth every 4 (four) hours as needed for moderate pain or severe pain. 03/21/20   Vallarie Mare, MD  pravastatin (PRAVACHOL) 20 MG tablet Take 20 mg by mouth in the morning.  12/17/12   [provider]  PROAIR HFA 108 (90 BASE) MCG/ACT inhaler Inhale 1 puff into the lungs every 4 (four) hours as needed for wheezing or shortness of breath. 05/13/10   [provider]  QUEtiapine (SEROQUEL) 400 MG tablet Take 1 tablet (400 mg total) by mouth at bedtime. Patient taking differently: Take 600 mg by mouth at bedtime. 06/12/19   Barton Dubois, MD  TRELEGY ELLIPTA 100-62.5-25 MCG/INH AEPB Inhale 1 puff into the lungs daily. 03/11/18   [provider]  Venlafaxine HCl 150 MG TB24 Take 150 mg by mouth daily with breakfast.  [provider]      Allergies    Amicinonide-benzyl alcohol [cyclocort], Amoxicillin, Doxycycline, Paxil [paroxetine hcl], Penicillins, Sulfa antibiotics, and Trimethoprim    Review of Systems   Review of Systems  Physical Exam Updated Vital Signs BP 134/84 (BP Location: Left Arm)    Pulse 64    Temp 99.1 F (37.3 C) (Oral)    Resp 16    Ht 5\' 2"  (1.575 m)    Wt 66.7 kg    SpO2 97%    BMI 26.89 kg/m  Physical Exam Vitals and nursing note reviewed.  Constitutional:      General: She is not in acute distress.    Appearance: She is well-developed. She is not ill-appearing or  diaphoretic.  HENT:     Head: Normocephalic and atraumatic.     Right Ear: External ear normal.     Left Ear: External ear normal.  Eyes:     Conjunctiva/sclera: Conjunctivae normal.     Pupils: Pupils are equal, round, and reactive to light.  Neck:     Trachea: Phonation normal.  Cardiovascular:     Rate and Rhythm: Normal rate.  Pulmonary:     Effort: Pulmonary effort is normal.  Abdominal:     General: There is no distension.  Musculoskeletal:        General: Normal range of motion.     Cervical back: Normal range of motion and neck supple.     Comments: Mild tenderness, right lumbar.  No tenderness of the right hip or right leg.  Negative straight leg raising bilaterally.  Skin:    General: Skin is warm and dry.  Neurological:     Mental Status: She is alert and oriented to person, place, and time.     Cranial Nerves: No cranial nerve deficit.     Sensory: No sensory deficit.     Motor: No abnormal muscle tone.     Coordination: Coordination normal.  Psychiatric:        Mood and Affect: Mood normal.        Behavior: Behavior normal.        Thought Content: Thought content normal.        Judgment: Judgment normal.    ED Results / Procedures / Treatments   Labs (all labs ordered are listed, but only abnormal results are displayed) Labs Reviewed - No data to display  EKG None  Radiology DG Lumbar Spine Complete  Result Date: 02/13/2021 CLINICAL DATA:  Back pain radiating into the right leg for a few weeks, initial encounter EXAM: LUMBAR SPINE - COMPLETE 4+ VIEW COMPARISON:  10/29/2020 FINDINGS: Postsurgical changes are noted with interbody fusion at L4-5. Posterior fixation is noted and stable. No hardware abnormality is noted. No compression deformities are seen. No anterolisthesis is noted. Very mild osteophytic changes are noted. IMPRESSION: Postsurgical change at L4-5 with mild osteophytic change. No acute abnormality is noted. Electronically Signed   By: Inez Catalina M.D.   On: 02/13/2021 23:12    Procedures Procedures    Medications Ordered in ED Medications  ketorolac (TORADOL) 30 MG/ML injection 15 mg (15 mg Intramuscular Given 02/13/21 2247)  oxyCODONE-acetaminophen (PERCOCET/ROXICET) 5-325 MG per tablet 1 tablet (1 tablet Oral Given 02/14/21 0033)  predniSONE (DELTASONE) tablet 60 mg (60 mg Oral Given 02/14/21 0032)    ED Course/ Medical Decision Making/ A&P  Medical Decision Making Presenting with exacerbation of chronic pain without trauma.  Prior back surgery.  Sciatica symptoms on arrival  Amount and/or Complexity of Data Reviewed Radiology: ordered and independent interpretation performed.    Details: Lumbosacral series-no dislocation or fracture  Risk Prescription drug management. Risk Details: Recurrent symptoms, sciatica present.  She has chronic pain and takes narcotics regularly.  She was given a dose of Percocet in the ED as well as prednisone and started on a prednisone taper.  Instructed to follow-up with her PCP or neurosurgeon as needed.           Final Clinical Impression(s) / ED Diagnoses Final diagnoses:  Sciatica of right side    Rx / DC Orders ED Discharge Orders          Ordered    predniSONE (DELTASONE) 20 MG tablet  2 times daily        02/14/21 0012              Daleen Bo, MD 02/14/21 1340

## 2021-02-14 DIAGNOSIS — M5441 Lumbago with sciatica, right side: Secondary | ICD-10-CM | POA: Diagnosis not present

## 2021-02-14 MED ORDER — OXYCODONE-ACETAMINOPHEN 5-325 MG PO TABS
1.0000 | ORAL_TABLET | Freq: Once | ORAL | Status: AC
  Administered 2021-02-14: 1 via ORAL
  Filled 2021-02-14: qty 1

## 2021-02-14 MED ORDER — PREDNISONE 50 MG PO TABS
60.0000 mg | ORAL_TABLET | Freq: Once | ORAL | Status: AC
  Administered 2021-02-14: 60 mg via ORAL
  Filled 2021-02-14: qty 1

## 2021-02-14 MED ORDER — PREDNISONE 20 MG PO TABS: 20.0000 mg | ORAL_TABLET | Freq: Two times a day (BID) | ORAL | 0 refills | Status: DC

## 2021-02-14 NOTE — Discharge Instructions (Signed)
Use heat on the sore area 3-4 times a day.  Rest as much as needed to help your discomfort.  Follow-up with your primary care doctor for checkup if not better in a few days.

## 2021-02-26 ENCOUNTER — Encounter: Payer: Self-pay | Admitting: Pulmonary Disease

## 2021-02-26 ENCOUNTER — Other Ambulatory Visit: Payer: Self-pay

## 2021-02-26 ENCOUNTER — Ambulatory Visit (INDEPENDENT_AMBULATORY_CARE_PROVIDER_SITE_OTHER): Payer: 59 | Admitting: Pulmonary Disease

## 2021-02-26 VITALS — BP 130/84 | HR 75 | Ht 62.0 in | Wt 145.2 lb

## 2021-02-26 DIAGNOSIS — R1310 Dysphagia, unspecified: Secondary | ICD-10-CM | POA: Diagnosis not present

## 2021-02-26 DIAGNOSIS — R059 Cough, unspecified: Secondary | ICD-10-CM

## 2021-02-26 DIAGNOSIS — J449 Chronic obstructive pulmonary disease, unspecified: Secondary | ICD-10-CM

## 2021-02-26 DIAGNOSIS — F17210 Nicotine dependence, cigarettes, uncomplicated: Secondary | ICD-10-CM | POA: Diagnosis not present

## 2021-02-26 NOTE — Patient Instructions (Signed)
We will schedule you for modified barium swallow to evaluate your cough and issues with food getting stuck.   Continue trelegy ellipta 1 puff daily and as needed albuterol.   Recommend smoking cessation with 21mg  nicotine patch daily along with mini nicotine lozenges as needed. You can step down the dose of nicotine patches as you feel ready.   We will refer you to our lung cancer screening program

## 2021-02-26 NOTE — Progress Notes (Signed)
Synopsis: Referred in February 2023 for COPD by Carylon Perches, NP  Subjective:   PATIENT ID: Tanya Velazquez, MRN: [id number]  HPI  Chief Complaint  Patient presents with   Consult    Referred by PCP for history of COPD. States she has a productive cough, phlegm ranges from clear to brown at times.    Tanya Velazquez is a 61 year old woman, daily smoker with GERD, hiattal hernia, DMII and hypertension who is referred to pulmonary clinic for evaluation of COPD.  She reports being diagnosed with COPD 8 to 9 years ago. She is currently using trelegy ellipta 1 puff daily and has been using this inhaler over the past 4-5 years. Initially she did notice benefit from the trelegy but of recent she has been experiencing increasing dyspnea, occasional wheezing and cough with brownish sputum. She is having nighttime awakenings due to cough. She experiences shortness of breath when walking long distances and is otherwise able to perform her activities of daily living with out much issue. She has noticed increased cough when eating along with the sensation of food getting stuck in her throat and chest. She denies frequent pneumonias.  She is smoking 1 pack per day currently. She smoked 2 packs per day for 20 years and has been smoking since the age of 62. She has 60+ pack year smoking history.   Past Medical History:  Diagnosis Date   Anxiety    B12 deficiency anemia    Chronic constipation    Chronic constipation    Chronic low back pain    lumbar radiculopathy   COPD (chronic obstructive pulmonary disease) (HCC)    Diabetes mellitus    DJD (degenerative joint disease)    GERD (gastroesophageal reflux disease) 04/13/07   EGD Dr Rourk->patulous EG junction, HH, antral erosions   H/O hiatal hernia    Hyperplastic colon polyp 06/27/2010   Hypertension    Migraines    Osteoarthritis    Raspy voice    Smokers' cough ([place])    SUI (stress urinary incontinence, female)       Family History  Problem Relation Age of Onset   Colon cancer Mother 66   Hypertension Mother    Heart disease Mother 88       CABG   Alzheimer's disease Father    Hypertension Father    Healthy Daughter    Heart failure Sister    Heart Problems Sister        Cardiac stent     Social History   Socioeconomic History   Marital status: Divorced    Spouse name: Not on file   Number of children: 3   Years of education: Not on file   Highest education level: Not on file  Occupational History   Occupation: disabled    Employer: UNEMPLOYED  Tobacco Use   Smoking status: Some Days    Packs/day: 1.00    Years: 35.00    Pack years: 35.00    Types: Cigarettes   Smokeless tobacco: Never   Tobacco comments:    03/2020 down to 1/2 PPD  Vaping Use   Vaping Use: Never used  Substance and Sexual Activity   Alcohol use: No    Alcohol/week: 0.0 standard drinks   Drug use: No   Sexual activity: Yes    Birth control/protection: Surgical  Other Topics Concern   Not on file  Social History Narrative   Lives alone in a one story home.  Has 2 children.     On disability since 2007 for syncope.  Did work in [employer] fund[employer].     Education: high school.   Social Determinants of Health   Financial Resource Strain: Not on file  Food Insecurity: Not on file  Transportation Needs: Not on file  Physical Activity: Not on file  Stress: Not on file  Social Connections: Not on file  Intimate Partner Violence: Not on file     Allergies  Allergen Reactions   Amicinonide-Benzyl Alcohol [Cyclocort] Rash   Amoxicillin Rash        Doxycycline Rash   Paxil [Paroxetine Hcl] Rash   Penicillins Rash    Reaction: 7 years    Sulfa Antibiotics Rash   Trimethoprim Rash     Outpatient Medications Prior to Visit  Medication Sig Dispense Refill   buPROPion (WELLBUTRIN SR) 150 MG 12 hr tablet Take 150 mg by mouth at bedtime.      busPIRone (BUSPAR) 15 MG tablet Take 15 mg by mouth 2 (two) times  daily.     Cholecalciferol (VITAMIN D3) 50 MCG (2000 UT) TABS Take 2,000 Units by mouth in the morning.      cyanocobalamin 1000 MCG tablet Take 1 tablet (1,000 mcg total) by mouth 2 (two) times daily.     cyclobenzaprine (FLEXERIL) 10 MG tablet Take 1 tablet (10 mg total) by mouth 3 (three) times daily as needed for muscle spasms. 30 tablet 0   docusate sodium (COLACE) 100 MG capsule Take 1 capsule (100 mg total) by mouth 2 (two) times daily. 60 capsule 2   famotidine (PEPCID) 20 MG tablet Take 1 tablet (20 mg total) by mouth daily. 30 tablet 1   gabapentin (NEURONTIN) 300 MG capsule Take 300 mg by mouth 3 (three) times daily.      hydrocortisone (ANUSOL-HC) 2.5 % rectal cream Place 1 application rectally 2 (two) times daily. (Patient taking differently: Place 1 application rectally 2 (two) times daily as needed for hemorrhoids (discomfort).) 30 g 1   ipratropium-albuterol (DUONEB) 0.5-2.5 (3) MG/3ML SOLN Inhale 3 mLs into the lungs every 6 (six) hours as needed for wheezing or shortness of breath.     LINZESS 290 MCG CAPS capsule TAKE 1 CAPSULE BY MOUTH ONCE DAILY BEFORE BREAKFAST. 90 capsule 3   mirtazapine (REMERON) 7.5 MG tablet Take 7.5 mg by mouth at bedtime.     Oxycodone HCl 10 MG TABS Take 10 mg by mouth 3 (three) times daily as needed.     oxyCODONE-acetaminophen (PERCOCET/ROXICET) 5-325 MG tablet Take 1-2 tablets by mouth every 4 (four) hours as needed for moderate pain or severe pain. 45 tablet 0   PROAIR HFA 108 (90 BASE) MCG/ACT inhaler Inhale 1 puff into the lungs every 4 (four) hours as needed for wheezing or shortness of breath.     rosuvastatin (CRESTOR) 20 MG tablet Take 20 mg by mouth at bedtime.     TRELEGY ELLIPTA 100-62.5-25 MCG/INH AEPB Inhale 1 puff into the lungs daily.     TRULICITY 4.70 JG/2.8ZM SOPN Inject into the skin.     venlafaxine (EFFEXOR) 75 MG tablet Take 75 mg by mouth 2 (two) times daily.     Venlafaxine HCl 150 MG TB24 Take 150 mg by mouth daily with  breakfast.     predniSONE (DELTASONE) 20 MG tablet Take 1 tablet (20 mg total) by mouth 2 (two) times daily. 10 tablet 0   pravastatin (PRAVACHOL) 20 MG tablet Take 20 mg by mouth in the  morning.      QUEtiapine (SEROQUEL) 400 MG tablet Take 1 tablet (400 mg total) by mouth at bedtime. (Patient taking differently: Take 600 mg by mouth at bedtime.)     No facility-administered medications prior to visit.   Review of Systems  Constitutional:  Negative for chills, fever, malaise/fatigue and weight loss.  HENT:  Positive for sore throat. Negative for congestion and sinus pain.   Eyes: Negative.   Respiratory:  Positive for cough, sputum production, shortness of breath and wheezing. Negative for hemoptysis.   Cardiovascular:  Positive for leg swelling. Negative for chest pain, palpitations, orthopnea and claudication.  Gastrointestinal:  Positive for heartburn. Negative for abdominal pain, nausea and vomiting.  Genitourinary: Negative.   Musculoskeletal:  Negative for joint pain and myalgias.  Skin:  Negative for rash.  Neurological:  Positive for headaches. Negative for weakness.  Endo/Heme/Allergies: Negative.   Psychiatric/Behavioral:  Positive for depression. The patient is nervous/anxious.    Objective:   Vitals:   02/26/21 1031  BP: 130/84  Pulse: 75  SpO2: 98%  Weight: 145 lb 3.2 oz (65.9 kg)  Height: 5\' 2"  (1.575 m)    Physical Exam Constitutional:      General: She is not in acute distress.    Appearance: She is not ill-appearing.  HENT:     Head: Normocephalic and atraumatic.  Eyes:     General: No scleral icterus.    Conjunctiva/sclera: Conjunctivae normal.     Pupils: Pupils are equal, round, and reactive to light.  Cardiovascular:     Rate and Rhythm: Normal rate and regular rhythm.     Pulses: Normal pulses.     Heart sounds: Normal heart sounds. No murmur heard. Pulmonary:     Effort: Pulmonary effort is normal.     Breath sounds: Normal breath sounds.  Decreased air movement present. No wheezing, rhonchi or rales.  Abdominal:     General: Bowel sounds are normal.     Palpations: Abdomen is soft.  Musculoskeletal:     Right lower leg: No edema.     Left lower leg: No edema.  Lymphadenopathy:     Cervical: No cervical adenopathy.  Skin:    General: Skin is warm and dry.  Neurological:     General: No focal deficit present.     Mental Status: She is alert.  Psychiatric:        Mood and Affect: Mood normal.        Behavior: Behavior normal.        Thought Content: Thought content normal.        Judgment: Judgment normal.   CBC    Component Value Date/Time   WBC 7.7 03/20/2020 0544   RBC 4.06 03/20/2020 0544   HGB 12.7 03/20/2020 0544   HCT 37.9 03/20/2020 0544   PLT 231 03/20/2020 0544   MCV 93.3 03/20/2020 0544   MCH 31.3 03/20/2020 0544   MCHC 33.5 03/20/2020 0544   RDW 12.7 03/20/2020 0544   LYMPHSABS 1.3 06/10/2019 1403   MONOABS 0.4 06/10/2019 1403   EOSABS 0.1 06/10/2019 1403   BASOSABS 0.0 06/10/2019 1403   BMP Latest Ref Rng & Units 03/20/2020 06/12/2019 06/11/2019  Glucose 70 - 99 mg/dL 103(H) 92 85  BUN 6 - 20 mg/dL 6 26(H) 48(H)  Creatinine 0.44 - 1.00 mg/dL 1.04(H) 1.20(H) 2.94(H)  Sodium 135 - 145 mmol/L 137 142 141  Potassium 3.5 - 5.1 mmol/L 3.8 5.0 4.7  Chloride 98 - 111 mmol/L 101 112(H)  111  CO2 22 - 32 mmol/L 26 25 23   Calcium 8.9 - 10.3 mg/dL 10.1 9.2 9.0   Chest imaging: CXR 06/10/19 The heart size and mediastinal contours are within normal limits. No pleural effusion or edema. Diffusely coarsened interstitial markings are identified bilaterally. No superimposed airspace consolidation. The visualized skeletal structures are unremarkable  PFT: No flowsheet data found.  Labs:  Path:  Echo 10/08/18: LV EF 60-65%. Mild LVH. RV is normal size and function. LA is normal size. RA is normal Size. No valvular issues.   Heart Catheterization:  Assessment & Plan:   Chronic obstructive pulmonary  disease, unspecified COPD type ([place]) - Plan: Pulmonary Function Test  Dysphagia, unspecified type - Plan: SLP modified barium swallow, CANCELED: SLP modified barium swallow  Cough, unspecified type - Plan: SLP modified barium swallow  Cigarette smoker - Plan: Ambulatory Referral for Lung Cancer Scre  Discussion: Misaki Sozio is a 61 year old woman, daily smoker with GERD, hiattal hernia, DMII and hypertension who is referred to pulmonary clinic for evaluation of COPD.  She is to continue continue trelegy ellipta 1 puff daily for her COPD along with as needed albuterol. We discussed smoking cessation options and recommended she use nicotine replacement therapy with 21mg  patches daily and as needed mini nicotine lozenges. We will refer her to lung cancer screening based on her age and pack year history.   She has concerning history for dysphagia so a modified barium swallow has been ordered.  Follow up in 4 months with pulmonary function testing.  Freda Jackson, MD [facility] Office: [phone]   Current Outpatient Medications:    buPROPion [facility] SR) 150 MG 12 hr tablet, Take 150 mg by mouth at bedtime. , Disp: , Rfl:    busPIRone (BUSPAR) 15 MG tablet, Take 15 mg by mouth 2 (two) times daily., Disp: , Rfl:    Cholecalciferol (VITAMIN D3) 50 MCG (2000 UT) TABS, Take 2,000 Units by mouth in the morning. , Disp: , Rfl:    cyanocobalamin 1000 MCG tablet, Take 1 tablet (1,000 mcg total) by mouth 2 (two) times daily., Disp: , Rfl:    cyclobenzaprine (FLEXERIL) 10 MG tablet, Take 1 tablet (10 mg total) by mouth 3 (three) times daily as needed for muscle spasms., Disp: 30 tablet, Rfl: 0   docusate sodium (COLACE) 100 MG capsule, Take 1 capsule (100 mg total) by mouth 2 (two) times daily., Disp: 60 capsule, Rfl: 2   famotidine (PEPCID) 20 MG tablet, Take 1 tablet (20 mg total) by mouth daily., Disp: 30 tablet, Rfl: 1   gabapentin (NEURONTIN) 300 MG capsule, Take  300 mg by mouth 3 (three) times daily. , Disp: , Rfl:    hydrocortisone (ANUSOL-HC) 2.5 % rectal cream, Place 1 application rectally 2 (two) times daily. (Patient taking differently: Place 1 application rectally 2 (two) times daily as needed for hemorrhoids (discomfort).), Disp: 30 g, Rfl: 1   ipratropium-albuterol (DUONEB) 0.5-2.5 (3) MG/3ML SOLN, Inhale 3 mLs into the lungs every 6 (six) hours as needed for wheezing or shortness of breath., Disp: , Rfl:    LINZESS 290 MCG CAPS capsule, TAKE 1 CAPSULE BY MOUTH ONCE DAILY BEFORE BREAKFAST., Disp: 90 capsule, Rfl: 3   mirtazapine (REMERON) 7.5 MG tablet, Take 7.5 mg by mouth at bedtime., Disp: , Rfl:    Oxycodone HCl 10 MG TABS, Take 10 mg by mouth 3 (three) times daily as needed., Disp: , Rfl:    oxyCODONE-acetaminophen (PERCOCET/ROXICET) 5-325 MG tablet,  Take 1-2 tablets by mouth every 4 (four) hours as needed for moderate pain or severe pain., Disp: 45 tablet, Rfl: 0   PROAIR HFA 108 (90 BASE) MCG/ACT inhaler, Inhale 1 puff into the lungs every 4 (four) hours as needed for wheezing or shortness of breath., Disp: , Rfl:    rosuvastatin (CRESTOR) 20 MG tablet, Take 20 mg by mouth at bedtime., Disp: , Rfl:    TRELEGY ELLIPTA 100-62.5-25 MCG/INH AEPB, Inhale 1 puff into the lungs daily., Disp: , Rfl:    TRULICITY 7.90 WI/0.9BD SOPN, Inject into the skin., Disp: , Rfl:    venlafaxine (EFFEXOR) 75 MG tablet, Take 75 mg by mouth 2 (two) times daily., Disp: , Rfl:    Venlafaxine HCl 150 MG TB24, Take 150 mg by mouth daily with breakfast., Disp: , Rfl:

## 2021-03-01 ENCOUNTER — Other Ambulatory Visit (HOSPITAL_COMMUNITY): Payer: Self-pay | Admitting: Specialist

## 2021-03-01 DIAGNOSIS — R053 Chronic cough: Secondary | ICD-10-CM

## 2021-03-01 DIAGNOSIS — R1319 Other dysphagia: Secondary | ICD-10-CM

## 2021-03-05 ENCOUNTER — Encounter: Payer: Self-pay | Admitting: Pulmonary Disease

## 2021-03-11 ENCOUNTER — Other Ambulatory Visit (HOSPITAL_COMMUNITY): Payer: Self-pay | Admitting: Pulmonary Disease

## 2021-03-11 ENCOUNTER — Other Ambulatory Visit: Payer: Self-pay

## 2021-03-11 ENCOUNTER — Encounter (HOSPITAL_COMMUNITY): Payer: Self-pay | Admitting: Speech Pathology

## 2021-03-11 ENCOUNTER — Ambulatory Visit (HOSPITAL_COMMUNITY)
Admission: RE | Admit: 2021-03-11 | Discharge: 2021-03-11 | Disposition: A | Discharge status: Home / Self Care | Payer: 59 | Source: Ambulatory Visit | Attending: Pulmonary Disease | Admitting: Pulmonary Disease

## 2021-03-11 ENCOUNTER — Ambulatory Visit (HOSPITAL_COMMUNITY): Payer: 59 | Attending: Pulmonary Disease | Admitting: Speech Pathology

## 2021-03-11 DIAGNOSIS — R1312 Dysphagia, oropharyngeal phase: Secondary | ICD-10-CM | POA: Diagnosis not present

## 2021-03-11 DIAGNOSIS — R053 Chronic cough: Secondary | ICD-10-CM

## 2021-03-11 DIAGNOSIS — R1319 Other dysphagia: Secondary | ICD-10-CM | POA: Diagnosis not present

## 2021-03-11 NOTE — Therapy (Signed)
[place], [place], [address] Phone: [phone]   Fax:  [phone]  Modified Barium Swallow  Patient Details  Name: Tanya Velazquez MRN: [id number] Date of Birth: March 08, 1960 No data recorded  Encounter Date: 03/11/2021   End of Session - 03/11/21 1542     Visit Number 1    Number of Visits 1    Authorization Type [employer] Medicare    SLP Start Time 1140    SLP Stop Time  1208    SLP Time Calculation (min) 28 min    Activity Tolerance Patient tolerated treatment well             Past Medical History:  Diagnosis Date   Anxiety    B12 deficiency anemia    Chronic constipation    Chronic constipation    Chronic low back pain    lumbar radiculopathy   COPD (chronic obstructive pulmonary disease) (HCC)    Diabetes mellitus    DJD (degenerative joint disease)    GERD (gastroesophageal reflux disease) 04/13/07   EGD Dr Rourk->patulous EG junction, HH, antral erosions   H/O hiatal hernia    Hyperplastic colon polyp 06/27/2010   Hypertension    Migraines    Osteoarthritis    Raspy voice    Smokers' cough ([place])    SUI (stress urinary incontinence, female)     Past Surgical History:  Procedure Laterality Date   APPENDECTOMY     BLADDER SUSPENSION  10/07/2011   Procedure: SPARC PROCEDURE;  Surgeon: Malka So, MD;  Location: [facility];  Service: Urology;  Laterality: N/A;  1 hour requested for this case    CARDIAC CATHETERIZATION  08-09-2004 DR Einar Gip   NORMAL CORONARY ARTERIES/  NORMAL LVF   CARDIAC CATHETERIZATION  2006   normal coronary arteries   COLONOSCOPY  3/31/9   Dr Rourk->friable anal canal   COLONOSCOPY  0 06/27/10   Dr. Gala Romney hyperplastic polyp, prominent external hemorrhoid plexus likely source of hematochezia, long tortuous colon. next tcs 06/2015.   COLONOSCOPY WITH PROPOFOL N/A 04/07/2019   Procedure: COLONOSCOPY WITH PROPOFOL;  Surgeon: Daneil Dolin, MD;  [facility];  Service: Endoscopy;  Laterality: N/A;  2:00pm - spoke w/pt, she knows to arrive at 11:30, she could not take the AM slot due to transportation   CYSTOSCOPY  10/07/2011   Procedure: CYSTOSCOPY;  Surgeon: Malka So, MD;  Location: [facility];  Service: Urology;  Laterality: N/A;   ESOPHAGEAL MANOMETRY  2013   Dr. Derrill Kay at [facility]. incomplete bolus clearance with some breaks in contractions but there was peristalsis   ESOPHAGOGASTRODUODENOSCOPY  06/27/2010   Schatzki ring s/p dilation, small hiatal hernia, couple tiny antral erosions.   EXCISION VOLAR GANGLION LEFT WRIST  06-27-2005   HEAD-UP TILT TABLE TEST  01-21-2007 &  11-04-2004  DR WEINTRAUB   HX CHRONIC RECURRENT SYNCOPAL. EPICODES--  NEGATIVE RESULT INCLUDING ISUPREL INFUSION   HEMORRHOID SURGERY  09-13-2003   LAPAROSCOPIC CHOLECYSTECTOMY  02-23-2007   LAPAROTOMY W/ APPENDCTOMY AND LEFT SALPINGO-OOPHECTOMY  AGE 3'S   pH/impedence  2013   Dr. Derrill Kay at [facility]. Increased reflux off PPI with excellent correlation between reflux events and regurgitation. Patient advised to hold PPI for study by Dr. Derrill Kay.    POLYPECTOMY  04/07/2019   Procedure: POLYPECTOMY;  Surgeon: Daneil Dolin, MD;  [facility];  Service: Endoscopy;;   RIGHT SHOULDER ARTHROSCOPY/ DISTAL CLAVICLE RESECTION /  DEBRIDEMENT ROTATOR CUFF AND LABRUM TEAR/ BURSECTOMY  06-28-2010   IMPINGEMENT SYNDROME/ AC JOINT ARTHRITIS/ ROTATOR CUFF TENDINOPATHY   SHOULDER ARTHROSCOPY  08-13-2010   LEFT SHOULDER IMPINGEMENT SYNDROME/ DJ[facility] OF [facility] JOINT   STOMACH SURGERY  2013   [facility]ireflux surgery at [facility]   TRANSFORAMINAL LUMBAR INTERBODY FUSION W/ MIS 1 LEVEL N/A 03/20/2020   Procedure: MINIMALLY INVASIVE (MIS) TRANSFORAMINAL LUMBAR INTERBODY FUSION (TLIF) LUMBAR FOUR-LUMBAR FIVE, RIGHT;  Surgeon: Vallarie Mare, MD;  Location: [facility];  Service: Neurosurgery;  Laterality: N/A;  MINIMALLY INVASIVE (MIS) TRANSFORAMINAL LUMBAR INTERBODY FUSION (TLIF)  LUMBAR FOUR-LUMBAR FIVE, RIGHT   VAGINAL HYSTERECTOMY  age 3's    There were no vitals filed for this visit.   Subjective Assessment - 03/11/21 1530     Subjective "I've had a burning sensation in my mouth for about a month."    Special Tests MBSS    Currently in Pain? No/denies                 General - 03/11/21 1531       General Information   Date of Onset 02/28/21    HPI Tanya Velazquez is a 61 year old woman, daily smoker with GERD, hiattal hernia, DMII and hypertension who is referred for MBSS due to difficulty swallowing by Dr. Freda Jackson (pulmonary clinic). She reports being diagnosed with COPD 8 to 9 years ago. She is currently using trelegy ellipta 1 puff daily and has been using this inhaler over the past 4-5 years. Initially she did notice benefit from the trelegy but of recent she has been experiencing increasing dyspnea, occasional wheezing and cough with brownish sputum. She is having nighttime awakenings due to cough. She experiences shortness of breath when walking long distances and is otherwise able to perform her activities of daily living with out much issue. She has noticed increased cough when eating along with the sensation of food getting stuck in her throat and chest. She denies frequent pneumonias. She is smoking 1 pack per day currently. She smoked 2 packs per day for 20 years and has been smoking since the age of 61. She has 60+ pack year smoking history. MBSS requested.    Type of Study MBS-Modified Barium Swallow Study    Previous Swallow Assessment N/A    Diet Prior to this Study Regular;Thin liquids    Temperature Spikes Noted No    Respiratory Status Room air    History of Recent Intubation No    Behavior/Cognition Alert;Cooperative;Pleasant mood    Oral Cavity Assessment Within Functional Limits    Oral Care Completed by SLP No    Oral Cavity - Dentition Edentulous    Vision Functional for self feeding    Self-Feeding Abilities Able to  feed self    Patient Positioning Upright in chair    Baseline Vocal Quality Normal    Volitional Cough Strong    Volitional Swallow Able to elicit    Anatomy Within functional limits    Pharyngeal Secretions Not observed secondary MBS                Oral Preparation/Oral Phase - 03/11/21 1533       Oral Preparation/Oral Phase   Oral Phase Within functional limits      Electrical stimulation - Oral Phase   Was Electrical Stimulation Used No              Pharyngeal Phase - 03/11/21 1534       Pharyngeal Phase  Pharyngeal Phase Impaired      Pharyngeal - Thin   Pharyngeal- Thin Teaspoon Swallow initiation at pyriform sinus    Pharyngeal- Thin Cup Swallow initiation at pyriform sinus;Penetration/Aspiration during swallow    Pharyngeal Material does not enter airway;Material enters airway, remains ABOVE vocal cords then ejected out    Pharyngeal- Thin Straw Penetration/Aspiration during swallow    Pharyngeal Material does not enter airway;Material enters airway, remains ABOVE vocal cords then ejected out      Pharyngeal - Solids   Pharyngeal- Puree Swallow initiation at vallecula;Within functional limits    Pharyngeal- Regular Within functional limits;Delayed swallow initiation-vallecula      Electrical Stimulation - Pharyngeal Phase   Was Electrical Stimulation Used No              Cricopharyngeal Phase - 03/11/21 1538       Cervical Esophageal Phase   Cervical Esophageal Phase Impaired      Cervical Esophageal Phase - Solids   Pill Other (Comment)      Cervical Esophageal Phase - Comment   Other Esophageal Phase Observations delayed emptying of barium, brief stasis of barium tablet, some retrograde movement noted, radiologist confirmed dysmotility                      Plan - 03/11/21 1543     Clinical Impression Statement Pt presents with normal for age oropharyngeal swallow in setting of edentulous status. Pt with premature spillage  with liquids and swallow trigger at the pyriforms with liquids with resulting flash penetration of thins and no aspiration. Pt with trace oral residuals and swallowed two times to clear liquids. Pt without significant pharyngeal residue. Esophageal sweep revealed standing column of barium with some retrograde movement, indicative of dysmotility and confirmed by the radiologist. Recommend regular textures and thin liquids with reflux and aspiration precautions as it pertains to COPD risks for aspiration. Pt was given written information on esophageal and respiratory precautions related to swallow. Pt reports pain with swallow (initially said it was a burning sensation, but then said painful with each swallow) and also has signs of reflux. Consider ENT consult to visualize larynx. No further SLP needs at this time.    Treatment/Interventions Aspiration precaution training;Patient/family education    Consulted and Agree with Plan of Care Patient             Patient will benefit from skilled therapeutic intervention in order to improve the following deficits and impairments:   Dysphagia, oropharyngeal phase     Recommendations/Treatment - 03/11/21 1541       Swallow Evaluation Recommendations   Recommended Consults Consider ENT evaluation   Given reports of odynophagia   SLP Diet Recommendations Age appropriate regular;Thin    Liquid Administration via Cup;Straw    Medication Administration Whole meds with liquid    Supervision Patient able to self feed    Postural Changes Seated upright at 90 degrees;Remain upright for at least 30 minutes after feeds/meals              Prognosis - 03/11/21 1542       Prognosis   Prognosis for Safe Diet Advancement Good      Individuals Consulted   Report Sent to  Referring physician             Problem List Patient Active Problem List   Diagnosis Date Noted   Lumbar radiculopathy 03/20/2020   AKI (acute kidney injury) ([place]) 06/11/2019    Syncope and  collapse    Hypotension 06/09/2019   Prolapsed internal hemorrhoids, grade 3 05/20/2019   Rectal bleeding 02/04/2019   Rectal pain 02/04/2019   Syncope 10/07/2018   Metatarsal fracture 10/07/2018   Dizziness    Essential hypertension    Chest pain    Gastroesophageal reflux disease    Depression with anxiety    CKD (chronic kidney disease), symptom management only, stage 2 (mild)    Chronic renal failure, stage 3 (moderate)    Acute renal failure (ARF) ([place]) 08/09/2018   RUQ pain 04/10/2014   Abdominal pain 04/10/2014   Abnormal LFTs 04/10/2014   Intrahepatic bile duct dilation 04/10/2014   Common bile duct dilatation 04/10/2014   Constipation 06/11/2011   History of gastroesophageal reflux (GERD) 06/20/2010   Family history of colon cancer 06/20/2010   Thank you,  Genene Churn, [place]  Teddy Spike 03/11/2021, 3:54 PM  [place] [address] [place], [place], [address] Phone: [phone]   Fax:  [phone]  Name: ISOBELLA ASCHER MRN: [id number] Date of Birth: February 16, 1960

## 2021-03-13 ENCOUNTER — Telehealth: Payer: Self-pay | Admitting: Pulmonary Disease

## 2021-03-13 ENCOUNTER — Other Ambulatory Visit: Payer: Self-pay

## 2021-03-13 DIAGNOSIS — F17210 Nicotine dependence, cigarettes, uncomplicated: Secondary | ICD-10-CM

## 2021-03-13 DIAGNOSIS — Z87891 Personal history of nicotine dependence: Secondary | ICD-10-CM

## 2021-03-13 NOTE — Telephone Encounter (Signed)
Called patient because she was confused about the mini nicotine lozenges. I informed her that they are over the counter and do not need a prescription along with the nicotine patches. Patient verbalized understanding. Nothing further  ?
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
## 2021-04-17 ENCOUNTER — Encounter: Payer: Self-pay | Admitting: Acute Care

## 2021-04-17 ENCOUNTER — Ambulatory Visit (INDEPENDENT_AMBULATORY_CARE_PROVIDER_SITE_OTHER): Payer: 59 | Admitting: Acute Care

## 2021-04-17 DIAGNOSIS — F17210 Nicotine dependence, cigarettes, uncomplicated: Secondary | ICD-10-CM

## 2021-04-17 NOTE — Progress Notes (Signed)
Virtual Visit via Telephone Note ? ?I connected with Junius Roads on 04/17/21 at  9:30 AM EDT by telephone and verified that I am speaking with the correct person using two identifiers. ? ?Location: ?Patient:  At home ?Provider:  Working from home ?  ?I discussed the limitations, risks, security and privacy concerns of performing an evaluation and management service by telephone and the availability of in person appointments. I also discussed with the patient that there may be a patient responsible charge related to this service. The patient expressed understanding and agreed to proceed. ? ? ? ?Shared Decision Making Visit Lung Cancer Screening Program ?[phone]) ? ? ?Eligibility: ?Age 61 y.o. ?Pack Years Smoking History Calculation 66 pack year smoking history ?(# packs/per year x # years smoked) ?Recent History of coughing up blood  no ?Unexplained weight loss? no ?( >Than 15 pounds within the last 6 months ) ?Prior History Lung / other cancer no ?(Diagnosis within the last 5 years already requiring surveillance chest CT Scans). ?Smoking Status Current Smoker ?Former Smokers: Years since quit:  NA ? Quit Date:  NA ? ?Visit Components: ?Discussion included one or more decision making aids. yes ?Discussion included risk/benefits of screening. yes ?Discussion included potential follow up diagnostic testing for abnormal scans. yes ?Discussion included meaning and risk of over diagnosis. yes ?Discussion included meaning and risk of False Positives. yes ?Discussion included meaning of total radiation exposure. yes ? ?Counseling Included: ?Importance of adherence to annual lung cancer LDCT screening. yes ?Impact of comorbidities on ability to participate in the program. yes ?Ability and willingness to under diagnostic treatment. yes ? ?Smoking Cessation Counseling: ?Current Smokers:  ?Discussed importance of smoking cessation. yes ?Information about tobacco cessation classes and interventions provided to patient.  yes ?Patient provided with "ticket" for LDCT Scan. yes ?Symptomatic Patient. no ? Counseling NA ?Diagnosis Code: Tobacco Use Z72.0 ?Asymptomatic Patient yes ? Counseling (Intermediate counseling: > three minutes counseling) G2694 ?Former Smokers:  ?Discussed the importance of maintaining cigarette abstinence. yes ?Diagnosis Code: Personal History of Nicotine Dependence. W54.627 ?Information about tobacco cessation classes and interventions provided to patient. Yes ?Patient provided with "ticket" for LDCT Scan. yes ?Written Order for Lung Cancer Screening with LDCT placed in Epic. Yes ?(CT Chest Lung Cancer Screening Low Dose W/O CM) [id number] ?Z12.2-Screening of respiratory organs ?Z87.891-Personal history of nicotine dependence ? ?I have spent 25 minutes of face to face/ virtual visit   time with  Ms. Dillow discussing the risks and benefits of lung cancer screening. We viewed / discussed a power point together that explained in detail the above noted topics. We paused at intervals to allow for questions to be asked and answered to ensure understanding.We discussed that the single most powerful action that she can take to decrease her risk of developing lung cancer is to quit smoking. We discussed whether or not she is ready to commit to setting a quit date. We discussed options for tools to aid in quitting smoking including nicotine replacement therapy, non-nicotine medications, support groups, Quit Smart classes, and behavior modification. We discussed that often times setting smaller, more achievable goals, such as eliminating 1 cigarette a day for a week and then 2 cigarettes a day for a week can be helpful in slowly decreasing the number of cigarettes smoked. This allows for a sense of accomplishment as well as providing a clinical benefit. I provided  her  with smoking cessation  information  with contact information for community resources, classes, free nicotine replacement therapy,  and access to mobile apps,  text messaging, and on-line smoking cessation help. I have also provided  her  the office contact information in the event she needs to contact me, or the screening staff. We discussed the time and location of the scan, and that either Doroteo Glassman RN, Joella Prince, RN  or I will call / send a letter with the results within 24-72 hours of receiving them. The patient verbalized understanding of all of  the above and had no further questions upon leaving the office. They have my contact information in the event they have any further questions. ? ?I spent 3 minutes counseling on smoking cessation and the health risks of continued tobacco abuse. ? ?I explained to the patient that there has been a high incidence of coronary artery disease noted on these exams. I explained that this is a non-gated exam therefore degree or severity cannot be determined. This patient is on statin therapy. I have asked the patient to follow-up with their PCP regarding any incidental finding of coronary artery disease and management with diet or medication as their PCP  feels is clinically indicated. The patient verbalized understanding of the above and had no further questions upon completion of the visit. ? ?  ? ? ?Magdalen Spatz, NP ?04/17/2021 ? ? ? ? ? ? ?

## 2021-04-17 NOTE — Patient Instructions (Signed)
Thank you for participating in the [facility]. It was our pleasure to meet you today. We will call you with the results of your scan within the next few days. Your scan will be assigned a Lung RADS category score by the physicians reading the scans.  This Lung RADS score determines follow up scanning.  See below for description of categories, and follow up screening recommendations. We will be in touch to schedule your follow up screening annually or based on recommendations of our providers. We will fax a copy of your scan results to your Primary Care Physician, or the physician who referred you to the program, to ensure they have the results. Please call the office if you have any questions or concerns regarding your scanning experience or results.  Our office number is [phone]. Please speak with Denise Phelps, RN. , or  Denise Buckner RN, They are  our Lung Cancer Screening RN.'s If They are unavailable when you call, Please leave a message on the voice mail. We will return your call at our earliest convenience.This voice mail is monitored several times a day.  Remember, if your scan is normal, we will scan you annually as long as you continue to meet the criteria for the program. (Age 55-77, Current smoker or smoker who has quit within the last 15 years). If you are a smoker, remember, quitting is the single most powerful action that you can take to decrease your risk of lung cancer and other pulmonary, breathing related problems. We know quitting is hard, and we are here to help.  Please let us know if there is anything we can do to help you meet your goal of quitting. If you are a former smoker, congratulations. We are proud of you! Remain smoke free! Remember you can refer friends or family members through the number above.  We will screen them to make sure they meet criteria for the program. Thank you for helping us take better care of you by  participating in Lung Screening.  You can receive free nicotine replacement therapy ( patches, gum or mints) by calling 1-800-QUIT NOW. Please call so we can get you on the path to becoming  a non-smoker. I know it is hard, but you can do this!  Lung RADS Categories:  Lung RADS 1: no nodules or definitely non-concerning nodules.  Recommendation is for a repeat annual scan in 12 months.  Lung RADS 2:  nodules that are non-concerning in appearance and behavior with a very low likelihood of becoming an active cancer. Recommendation is for a repeat annual scan in 12 months.  Lung RADS 3: nodules that are probably non-concerning , includes nodules with a low likelihood of becoming an active cancer.  Recommendation is for a 6-month repeat screening scan. Often noted after an upper respiratory illness. We will be in touch to make sure you have no questions, and to schedule your 6-month scan.  Lung RADS 4 A: nodules with concerning findings, recommendation is most often for a follow up scan in 3 months or additional testing based on our provider's assessment of the scan. We will be in touch to make sure you have no questions and to schedule the recommended 3 month follow up scan.  Lung RADS 4 B:  indicates findings that are concerning. We will be in touch with you to schedule additional diagnostic testing based on our provider's  assessment of the scan.  Other options for assistance in smoking cessation (   As covered by your insurance benefits)  Hypnosis for smoking cessation  [employer] [phone]  Acupuncture for smoking cessation  [facility] [phone]   

## 2021-04-22 ENCOUNTER — Ambulatory Visit (HOSPITAL_COMMUNITY)
Admission: RE | Admit: 2021-04-22 | Discharge: 2021-04-22 | Disposition: A | Discharge status: Home / Self Care | Payer: 59 | Source: Ambulatory Visit | Attending: Acute Care | Admitting: Acute Care

## 2021-04-22 DIAGNOSIS — Z87891 Personal history of nicotine dependence: Secondary | ICD-10-CM | POA: Diagnosis present

## 2021-04-22 DIAGNOSIS — F17210 Nicotine dependence, cigarettes, uncomplicated: Secondary | ICD-10-CM | POA: Insufficient documentation

## 2021-04-23 ENCOUNTER — Other Ambulatory Visit: Payer: Self-pay

## 2021-04-23 DIAGNOSIS — F17210 Nicotine dependence, cigarettes, uncomplicated: Secondary | ICD-10-CM

## 2021-04-23 DIAGNOSIS — Z87891 Personal history of nicotine dependence: Secondary | ICD-10-CM

## 2021-04-23 DIAGNOSIS — Z122 Encounter for screening for malignant neoplasm of respiratory organs: Secondary | ICD-10-CM

## 2021-05-07 ENCOUNTER — Telehealth: Payer: Self-pay | Admitting: Acute Care

## 2021-05-08 NOTE — Telephone Encounter (Signed)
Pt informed of CT results per Sarah Groce, NP.  PT verbalized understanding.  Copy of CT sent to PCP.  Order placed for 1 yr f/u CT. ° °

## 2021-07-03 ENCOUNTER — Ambulatory Visit: Payer: 59 | Admitting: Pulmonary Disease

## 2021-07-03 NOTE — Progress Notes (Deleted)
Synopsis: Referred in February 2023 for COPD by Carylon Perches, NP  Subjective:   PATIENT ID: Tanya Velazquez GENDER: female DOB: 1960/12/09, MRN: [id number]  HPI  No chief complaint on file.  Tanya Velazquez is a 61 year old woman, daily smoker with GERD, hiattal hernia, DMII and hypertension who returns to pulmonary clinic for COPD.  MBS showed premature spillage with liquids and swallow trigger at the pyriforms with liquids resulting in flash penetration of thins and no aspiration. Patient with trace oral residuals and swallowed two time to clear liquids. Esophageal sweep revealed standing column of barium with some retrograde movement, indicative of dysmotility. She also reported pain with swallowing.   She had lung cancer screening visit 04/17/21 with CT Chest 04/22/21 showing Lung-RADS 2 and mild paraseptal/centrilobular emphysema with diffuse bronchial wall thickening.   Trelegy ellipta 1 puff daily  Initial OV 02/26/21 She reports being diagnosed with COPD 8 to 9 years ago. She is currently using trelegy ellipta 1 puff daily and has been using this inhaler over the past 4-5 years. Initially she did notice benefit from the trelegy but of recent she has been experiencing increasing dyspnea, occasional wheezing and cough with brownish sputum. She is having nighttime awakenings due to cough. She experiences shortness of breath when walking long distances and is otherwise able to perform her activities of daily living with out much issue. She has noticed increased cough when eating along with the sensation of food getting stuck in her throat and chest. She denies frequent pneumonias.  She is smoking 1 pack per day currently. She smoked 2 packs per day for 20 years and has been smoking since the age of 82. She has 60+ pack year smoking history.   Past Medical History:  Diagnosis Date   Anxiety    B12 deficiency anemia    Chronic constipation    Chronic constipation    Chronic low back pain     lumbar radiculopathy   COPD (chronic obstructive pulmonary disease) (HCC)    Diabetes mellitus    DJD (degenerative joint disease)    GERD (gastroesophageal reflux disease) 04/13/07   EGD Dr Rourk->patulous EG junction, HH, antral erosions   H/O hiatal hernia    Hyperplastic colon polyp 06/27/2010   Hypertension    Migraines    Osteoarthritis    Raspy voice    Smokers' cough ([facility])    SUI (stress urinary incontinence, female)      Family History  Problem Relation Age of Onset   Colon cancer Mother 23   Hypertension Mother    Heart disease Mother 27       CABG   Alzheimer's disease Father    Hypertension Father    Healthy Daughter    Heart failure Sister    Heart Problems Sister        Cardiac stent     Social History   Socioeconomic History   Marital status: Divorced    Spouse name: Not on file   Number of children: 3   Years of education: Not on file   Highest education level: Not on file  Occupational History   Occupation: disabled    Employer: UNEMPLOYED  Tobacco Use   Smoking status: Some Days    Packs/day: 1.50    Years: 35.00    Total pack years: 52.50    Types: Cigarettes   Smokeless tobacco: Never   Tobacco comments:    03/2020 down to 1/2 PPD  Vaping Use  Vaping Use: Never used  Substance and Sexual Activity   Alcohol use: No    Alcohol/week: 0.0 standard drinks of alcohol   Drug use: No   Sexual activity: Yes    Birth control/protection: Surgical  Other Topics Concern   Not on file  Social History Narrative   Lives alone in a one story home.  Has 2 children.     On disability since 2007 for syncope.  Did work in [employer].     Education: high school.   Social Determinants of Health   Financial Resource Strain: Not on file  Food Insecurity: Not on file  Transportation Needs: Not on file  Physical Activity: Not on file  Stress: Not on file  Social Connections: Not on file  Intimate Partner Violence: Not on file     Allergies   Allergen Reactions   Amicinonide-Benzyl Alcohol [Cyclocort] Rash   Amoxicillin Rash        Doxycycline Rash   Paxil [Paroxetine Hcl] Rash   Penicillins Rash    Reaction: 7 years    Sulfa Antibiotics Rash   Trimethoprim Rash     Outpatient Medications Prior to Visit  Medication Sig Dispense Refill   buPROPion (WELLBUTRIN SR) 150 MG 12 hr tablet Take 150 mg by mouth at bedtime.      busPIRone (BUSPAR) 15 MG tablet Take 15 mg by mouth 2 (two) times daily.     Cholecalciferol (VITAMIN D3) 50 MCG (2000 UT) TABS Take 2,000 Units by mouth in the morning.      cyanocobalamin 1000 MCG tablet Take 1 tablet (1,000 mcg total) by mouth 2 (two) times daily.     cyclobenzaprine (FLEXERIL) 10 MG tablet Take 1 tablet (10 mg total) by mouth 3 (three) times daily as needed for muscle spasms. 30 tablet 0   docusate sodium (COLACE) 100 MG capsule Take 1 capsule (100 mg total) by mouth 2 (two) times daily. 60 capsule 2   famotidine (PEPCID) 20 MG tablet Take 1 tablet (20 mg total) by mouth daily. 30 tablet 1   gabapentin (NEURONTIN) 300 MG capsule Take 300 mg by mouth 3 (three) times daily.      hydrocortisone (ANUSOL-HC) 2.5 % rectal cream Place 1 application rectally 2 (two) times daily. (Patient taking differently: Place 1 application rectally 2 (two) times daily as needed for hemorrhoids (discomfort).) 30 g 1   ipratropium-albuterol (DUONEB) 0.5-2.5 (3) MG/3ML SOLN Inhale 3 mLs into the lungs every 6 (six) hours as needed for wheezing or shortness of breath.     LINZESS 290 MCG CAPS capsule TAKE 1 CAPSULE BY MOUTH ONCE DAILY BEFORE BREAKFAST. 90 capsule 3   mirtazapine (REMERON) 7.5 MG tablet Take 7.5 mg by mouth at bedtime.     Oxycodone HCl 10 MG TABS Take 10 mg by mouth 3 (three) times daily as needed.     oxyCODONE-acetaminophen (PERCOCET/ROXICET) 5-325 MG tablet Take 1-2 tablets by mouth every 4 (four) hours as needed for moderate pain or severe pain. 45 tablet 0   PROAIR HFA 108 (90 BASE) MCG/ACT  inhaler Inhale 1 puff into the lungs every 4 (four) hours as needed for wheezing or shortness of breath.     rosuvastatin (CRESTOR) 20 MG tablet Take 20 mg by mouth at bedtime.     TRELEGY ELLIPTA 100-62.5-25 MCG/INH AEPB Inhale 1 puff into the lungs daily.     TRULICITY 3.81 WE/9.9BZ SOPN Inject into the skin.     venlafaxine (EFFEXOR) 75 MG tablet Take 75  mg by mouth 2 (two) times daily.     Venlafaxine HCl 150 MG TB24 Take 150 mg by mouth daily with breakfast.     No facility-administered medications prior to visit.   Review of Systems  Constitutional:  Negative for chills, fever, malaise/fatigue and weight loss.  HENT:  Positive for sore throat. Negative for congestion and sinus pain.   Eyes: Negative.   Respiratory:  Positive for cough, sputum production, shortness of breath and wheezing. Negative for hemoptysis.   Cardiovascular:  Positive for leg swelling. Negative for chest pain, palpitations, orthopnea and claudication.  Gastrointestinal:  Positive for heartburn. Negative for abdominal pain, nausea and vomiting.  Genitourinary: Negative.   Musculoskeletal:  Negative for joint pain and myalgias.  Skin:  Negative for rash.  Neurological:  Positive for headaches. Negative for weakness.  Endo/Heme/Allergies: Negative.   Psychiatric/Behavioral:  Positive for depression. The patient is nervous/anxious.     Objective:   There were no vitals filed for this visit.   Physical Exam Constitutional:      General: She is not in acute distress.    Appearance: She is not ill-appearing.  HENT:     Head: Normocephalic and atraumatic.  Eyes:     General: No scleral icterus.    Conjunctiva/sclera: Conjunctivae normal.     Pupils: Pupils are equal, round, and reactive to light.  Cardiovascular:     Rate and Rhythm: Normal rate and regular rhythm.     Pulses: Normal pulses.     Heart sounds: Normal heart sounds. No murmur heard. Pulmonary:     Effort: Pulmonary effort is normal.      Breath sounds: Normal breath sounds. Decreased air movement present. No wheezing, rhonchi or rales.  Abdominal:     General: Bowel sounds are normal.     Palpations: Abdomen is soft.  Musculoskeletal:     Right lower leg: No edema.     Left lower leg: No edema.  Lymphadenopathy:     Cervical: No cervical adenopathy.  Skin:    General: Skin is warm and dry.  Neurological:     General: No focal deficit present.     Mental Status: She is alert.  Psychiatric:        Mood and Affect: Mood normal.        Behavior: Behavior normal.        Thought Content: Thought content normal.        Judgment: Judgment normal.    CBC    Component Value Date/Time   WBC 7.7 03/20/2020 0544   RBC 4.06 03/20/2020 0544   HGB 12.7 03/20/2020 0544   HCT 37.9 03/20/2020 0544   PLT 231 03/20/2020 0544   MCV 93.3 03/20/2020 0544   MCH 31.3 03/20/2020 0544   MCHC 33.5 03/20/2020 0544   RDW 12.7 03/20/2020 0544   LYMPHSABS 1.3 06/10/2019 1403   MONOABS 0.4 06/10/2019 1403   EOSABS 0.1 06/10/2019 1403   BASOSABS 0.0 06/10/2019 1403      Latest Ref Rng & Units 03/20/2020    5:44 AM 06/12/2019    6:16 AM 06/11/2019    4:49 AM  BMP  Glucose 70 - 99 mg/dL 103  92  85   BUN 6 - 20 mg/dL 6  26  48   Creatinine 0.44 - 1.00 mg/dL 1.04  1.20  2.94   Sodium 135 - 145 mmol/L 137  142  141   Potassium 3.5 - 5.1 mmol/L 3.8  5.0  4.7  Chloride 98 - 111 mmol/L 101  112  111   CO2 22 - 32 mmol/L '26  25  23   '$ Calcium 8.9 - 10.3 mg/dL 10.1  9.2  9.0    Chest imaging: CXR 06/10/19 The heart size and mediastinal contours are within normal limits. No pleural effusion or edema. Diffusely coarsened interstitial markings are identified bilaterally. No superimposed airspace consolidation. The visualized skeletal structures are unremarkable  PFT:     No data to display          Labs:  Path:  Echo 10/08/18: LV EF 60-65%. Mild LVH. RV is normal size and function. LA is normal size. RA is normal Size. No  valvular issues.   Heart Catheterization:  Assessment & Plan:   No diagnosis found.  Discussion: Tanya Velazquez is a 61 year old woman, daily smoker with GERD, hiattal hernia, DMII and hypertension who is referred to pulmonary clinic for evaluation of COPD.  She is to continue continue trelegy ellipta 1 puff daily for her COPD along with as needed albuterol. We discussed smoking cessation options and recommended she use nicotine replacement therapy with '21mg'$  patches daily and as needed mini nicotine lozenges. We will refer her to lung cancer screening based on her age and pack year history.   She has concerning history for dysphagia so a modified barium swallow has been ordered.  Follow up in 4 months with pulmonary function testing.  Freda Jackson, MD [facility] Office: [phone]   Current Outpatient Medications:    buPROPion [facility] SR) 150 MG 12 hr tablet, Take 150 mg by mouth at bedtime. , Disp: , Rfl:    busPIRone (BUSPAR) 15 MG tablet, Take 15 mg by mouth 2 (two) times daily., Disp: , Rfl:    Cholecalciferol (VITAMIN D3) 50 MCG (2000 UT) TABS, Take 2,000 Units by mouth in the morning. , Disp: , Rfl:    cyanocobalamin 1000 MCG tablet, Take 1 tablet (1,000 mcg total) by mouth 2 (two) times daily., Disp: , Rfl:    cyclobenzaprine (FLEXERIL) 10 MG tablet, Take 1 tablet (10 mg total) by mouth 3 (three) times daily as needed for muscle spasms., Disp: 30 tablet, Rfl: 0   docusate sodium (COLACE) 100 MG capsule, Take 1 capsule (100 mg total) by mouth 2 (two) times daily., Disp: 60 capsule, Rfl: 2   famotidine (PEPCID) 20 MG tablet, Take 1 tablet (20 mg total) by mouth daily., Disp: 30 tablet, Rfl: 1   gabapentin (NEURONTIN) 300 MG capsule, Take 300 mg by mouth 3 (three) times daily. , Disp: , Rfl:    hydrocortisone (ANUSOL-HC) 2.5 % rectal cream, Place 1 application rectally 2 (two) times daily. (Patient taking differently: Place 1 application rectally 2  (two) times daily as needed for hemorrhoids (discomfort).), Disp: 30 g, Rfl: 1   ipratropium-albuterol (DUONEB) 0.5-2.5 (3) MG/3ML SOLN, Inhale 3 mLs into the lungs every 6 (six) hours as needed for wheezing or shortness of breath., Disp: , Rfl:    LINZESS 290 MCG CAPS capsule, TAKE 1 CAPSULE BY MOUTH ONCE DAILY BEFORE BREAKFAST., Disp: 90 capsule, Rfl: 3   mirtazapine (REMERON) 7.5 MG tablet, Take 7.5 mg by mouth at bedtime., Disp: , Rfl:    Oxycodone HCl 10 MG TABS, Take 10 mg by mouth 3 (three) times daily as needed., Disp: , Rfl:    oxyCODONE-acetaminophen (PERCOCET/ROXICET) 5-325 MG tablet, Take 1-2 tablets by mouth every 4 (four) hours as needed for moderate pain or severe pain., Disp:  45 tablet, Rfl: 0   PROAIR HFA 108 (90 BASE) MCG/ACT inhaler, Inhale 1 puff into the lungs every 4 (four) hours as needed for wheezing or shortness of breath., Disp: , Rfl:    rosuvastatin (CRESTOR) 20 MG tablet, Take 20 mg by mouth at bedtime., Disp: , Rfl:    TRELEGY ELLIPTA 100-62.5-25 MCG/INH AEPB, Inhale 1 puff into the lungs daily., Disp: , Rfl:    TRULICITY 5.63 OV/5.6EP SOPN, Inject into the skin., Disp: , Rfl:    venlafaxine (EFFEXOR) 75 MG tablet, Take 75 mg by mouth 2 (two) times daily., Disp: , Rfl:    Venlafaxine HCl 150 MG TB24, Take 150 mg by mouth daily with breakfast., Disp: , Rfl:

## 2021-09-23 ENCOUNTER — Emergency Department (HOSPITAL_COMMUNITY): Payer: 59

## 2021-09-23 ENCOUNTER — Encounter (HOSPITAL_COMMUNITY): Payer: Self-pay | Admitting: Emergency Medicine

## 2021-09-23 ENCOUNTER — Emergency Department (HOSPITAL_COMMUNITY)
Admission: EM | Admit: 2021-09-23 | Discharge: 2021-09-24 | Disposition: A | Discharge status: Home / Self Care | Payer: 59 | Attending: Emergency Medicine | Admitting: Emergency Medicine

## 2021-09-23 ENCOUNTER — Other Ambulatory Visit: Payer: Self-pay

## 2021-09-23 DIAGNOSIS — X58XXXA Exposure to other specified factors, initial encounter: Secondary | ICD-10-CM | POA: Diagnosis not present

## 2021-09-23 DIAGNOSIS — S3992XA Unspecified injury of lower back, initial encounter: Secondary | ICD-10-CM | POA: Diagnosis present

## 2021-09-23 DIAGNOSIS — S22060A Wedge compression fracture of T7-T8 vertebra, initial encounter for closed fracture: Secondary | ICD-10-CM | POA: Insufficient documentation

## 2021-09-23 DIAGNOSIS — E119 Type 2 diabetes mellitus without complications: Secondary | ICD-10-CM | POA: Diagnosis not present

## 2021-09-23 DIAGNOSIS — R0789 Other chest pain: Secondary | ICD-10-CM | POA: Insufficient documentation

## 2021-09-23 DIAGNOSIS — J449 Chronic obstructive pulmonary disease, unspecified: Secondary | ICD-10-CM | POA: Insufficient documentation

## 2021-09-23 NOTE — ED Triage Notes (Signed)
Pt c/o right flank pain and mid back pain.

## 2021-09-24 ENCOUNTER — Emergency Department (HOSPITAL_COMMUNITY): Payer: 59

## 2021-09-24 DIAGNOSIS — S22060A Wedge compression fracture of T7-T8 vertebra, initial encounter for closed fracture: Secondary | ICD-10-CM | POA: Diagnosis not present

## 2021-09-24 LAB — CBC WITH DIFFERENTIAL/PLATELET
Abs Immature Granulocytes: 0.01 10*3/uL (ref 0.00–0.07)
Basophils Absolute: 0 10*3/uL (ref 0.0–0.1)
Basophils Relative: 0 %
Eosinophils Absolute: 0.1 10*3/uL (ref 0.0–0.5)
Eosinophils Relative: 2 %
HCT: 38.6 % (ref 36.0–46.0)
Hemoglobin: 12.7 g/dL (ref 12.0–15.0)
Immature Granulocytes: 0 %
Lymphocytes Relative: 29 %
Lymphs Abs: 1.9 10*3/uL (ref 0.7–4.0)
MCH: 31.4 pg (ref 26.0–34.0)
MCHC: 32.9 g/dL (ref 30.0–36.0)
MCV: 95.3 fL (ref 80.0–100.0)
Monocytes Absolute: 0.4 10*3/uL (ref 0.1–1.0)
Monocytes Relative: 6 %
Neutro Abs: 4.2 10*3/uL (ref 1.7–7.7)
Neutrophils Relative %: 63 %
Platelets: 212 10*3/uL (ref 150–400)
RBC: 4.05 MIL/uL (ref 3.87–5.11)
RDW: 13.2 % (ref 11.5–15.5)
WBC: 6.6 10*3/uL (ref 4.0–10.5)
nRBC: 0 % (ref 0.0–0.2)

## 2021-09-24 LAB — URINALYSIS, ROUTINE W REFLEX MICROSCOPIC
Bilirubin Urine: NEGATIVE
Glucose, UA: NEGATIVE mg/dL
Ketones, ur: NEGATIVE mg/dL
Nitrite: NEGATIVE
Protein, ur: NEGATIVE mg/dL
Specific Gravity, Urine: 1.009 (ref 1.005–1.030)
pH: 5 (ref 5.0–8.0)

## 2021-09-24 LAB — BASIC METABOLIC PANEL
Anion gap: 6 (ref 5–15)
BUN: 16 mg/dL (ref 8–23)
CO2: 29 mmol/L (ref 22–32)
Calcium: 9.8 mg/dL (ref 8.9–10.3)
Chloride: 103 mmol/L (ref 98–111)
Creatinine, Ser: 1.06 mg/dL — ABNORMAL HIGH (ref 0.44–1.00)
GFR, Estimated: 60 mL/min — ABNORMAL LOW (ref >60)
Glucose, Bld: 87 mg/dL (ref 70–99)
Potassium: 4.4 mmol/L (ref 3.5–5.1)
Sodium: 138 mmol/L (ref 135–145)

## 2021-09-24 LAB — TROPONIN I (HIGH SENSITIVITY)
Troponin I (High Sensitivity): <2 ng/L (ref <18)
Troponin I (High Sensitivity): 2 ng/L (ref <18)

## 2021-09-24 LAB — D-DIMER, QUANTITATIVE: D-Dimer, Quant: <0.27 ug/mL-FEU (ref 0.00–0.50)

## 2021-09-24 MED ORDER — IBUPROFEN 400 MG PO TABS
400.0000 mg | ORAL_TABLET | Freq: Once | ORAL | Status: AC
  Administered 2021-09-24: 400 mg via ORAL
  Filled 2021-09-24: qty 1

## 2021-09-24 MED ORDER — OXYCODONE-ACETAMINOPHEN 5-325 MG PO TABS
1.0000 | ORAL_TABLET | Freq: Once | ORAL | Status: AC
  Administered 2021-09-24: 1 via ORAL
  Filled 2021-09-24: qty 1

## 2021-09-24 NOTE — ED Provider Notes (Signed)
[facility] EMERGENCY DEPARTMENT Provider Note   CSN: [id number] Arrival date & time: 09/23/21  2303     History  Chief Complaint  Patient presents with   Flank Pain    Tanya Velazquez is a 61 y.o. female.  HPI     This is a 61 year old female who presents with right-sided posterior chest discomfort.  Patient reports symptoms for the last 2 to 3 days.  It is worse with certain range of motion and occasional breathing.  She has not had a cough.  No fevers.  Denies injury or heavy lifting.  Denies urinary symptoms such as dysuria or hematuria.  She has not taken anything for her symptoms.  Home Medications Prior to Admission medications   Medication Sig Start Date End Date Taking? Authorizing Provider  buPROPion (WELLBUTRIN SR) 150 MG 12 hr tablet Take 150 mg by mouth at bedtime.  05/27/19   [provider]  busPIRone (BUSPAR) 15 MG tablet Take 15 mg by mouth 2 (two) times daily.    [provider]  Cholecalciferol (VITAMIN D3) 50 MCG (2000 UT) TABS Take 2,000 Units by mouth in the morning.     [provider]  cyanocobalamin 1000 MCG tablet Take 1 tablet (1,000 mcg total) by mouth 2 (two) times daily.    [provider]  cyclobenzaprine (FLEXERIL) 10 MG tablet Take 1 tablet (10 mg total) by mouth 3 (three) times daily as needed for muscle spasms. 03/21/20   Vallarie Mare, MD  docusate sodium (COLACE) 100 MG capsule Take 1 capsule (100 mg total) by mouth 2 (two) times daily. 03/21/20   Vallarie Mare, MD  famotidine (PEPCID) 20 MG tablet Take 1 tablet (20 mg total) by mouth daily. 06/13/19   Barton Dubois, MD  gabapentin (NEURONTIN) 300 MG capsule Take 300 mg by mouth 3 (three) times daily.     [provider]  hydrocortisone (ANUSOL-HC) 2.5 % rectal cream Place 1 application rectally 2 (two) times daily. Patient taking differently: Place 1 application rectally 2 (two) times daily as needed for hemorrhoids (discomfort). 02/04/19    Erenest Rasher, PA-C  ipratropium-albuterol (DUONEB) 0.5-2.5 (3) MG/3ML SOLN Inhale 3 mLs into the lungs every 6 (six) hours as needed for wheezing or shortness of breath. 03/10/19   [provider]  LINZESS 290 MCG CAPS capsule TAKE 1 CAPSULE BY MOUTH ONCE DAILY BEFORE BREAKFAST. 01/10/21   Mahala Menghini, PA-C  mirtazapine (REMERON) 7.5 MG tablet Take 7.5 mg by mouth at bedtime. 03/12/20   [provider]  Oxycodone HCl 10 MG TABS Take 10 mg by mouth 3 (three) times daily as needed. 01/31/21   [provider]  oxyCODONE-acetaminophen (PERCOCET/ROXICET) 5-325 MG tablet Take 1-2 tablets by mouth every 4 (four) hours as needed for moderate pain or severe pain. 03/21/20   Vallarie Mare, MD  PROAIR HFA 108 (90 BASE) MCG/ACT inhaler Inhale 1 puff into the lungs every 4 (four) hours as needed for wheezing or shortness of breath. 05/13/10   [provider]  rosuvastatin (CRESTOR) 20 MG tablet Take 20 mg by mouth at bedtime. 02/08/21   [provider]  TRELEGY ELLIPTA 100-62.5-25 MCG/INH AEPB Inhale 1 puff into the lungs daily. 03/11/18   [provider]  TRULICITY 0.17 CB/4.4HQ SOPN Inject into the skin. 02/25/21   [provider]  venlafaxine (EFFEXOR) 75 MG tablet Take 75 mg by mouth 2 (two) times daily.    [provider]  Venlafaxine HCl  150 MG TB24 Take 150 mg by mouth daily with breakfast.    [provider]      Allergies    Amicinonide-benzyl alcohol [cyclocort], Amoxicillin, Doxycycline, Paxil [paroxetine hcl], Penicillins, Sulfa antibiotics, and Trimethoprim    Review of Systems   Review of Systems  Constitutional:  Negative for fever.  Respiratory:  Negative for cough and shortness of breath.   Cardiovascular:  Positive for chest pain.  Gastrointestinal:  Negative for abdominal pain.  Genitourinary:  Negative for dysuria.  All other systems reviewed and are negative.   Physical Exam Updated Vital  Signs BP 121/76   Pulse 63   Temp 98.5 F (36.9 C) (Oral)   Resp 14   Ht 1.575 m ('5\' 2"'$ )   Wt 60.3 kg   SpO2 99%   BMI 24.33 kg/m  Physical Exam Vitals and nursing note reviewed.  Constitutional:      Appearance: She is well-developed. She is not ill-appearing.  HENT:     Head: Normocephalic and atraumatic.  Eyes:     Pupils: Pupils are equal, round, and reactive to light.  Cardiovascular:     Rate and Rhythm: Normal rate and regular rhythm.     Heart sounds: Normal heart sounds.  Pulmonary:     Effort: Pulmonary effort is normal. No respiratory distress.     Breath sounds: No wheezing.     Comments: Right posterior inferior rib tenderness to palpation, no overlying skin changes Chest:     Chest wall: Tenderness present.  Abdominal:     Palpations: Abdomen is soft.  Musculoskeletal:     Cervical back: Neck supple.  Skin:    General: Skin is warm and dry.  Neurological:     Mental Status: She is alert and oriented to person, place, and time.  Psychiatric:        Mood and Affect: Mood normal.     ED Results / Procedures / Treatments   Labs (all labs ordered are listed, but only abnormal results are displayed) Labs Reviewed  URINALYSIS, ROUTINE W REFLEX MICROSCOPIC - Abnormal; Notable for the following components:      Result Value   Hgb urine dipstick SMALL (*)    Leukocytes,Ua SMALL (*)    Bacteria, UA RARE (*)    All other components within normal limits  BASIC METABOLIC PANEL - Abnormal; Notable for the following components:   Creatinine, Ser 1.06 (*)    GFR, Estimated 60 (*)    All other components within normal limits  CBC WITH DIFFERENTIAL/PLATELET  D-DIMER, QUANTITATIVE  TROPONIN I (HIGH SENSITIVITY)  TROPONIN I (HIGH SENSITIVITY)    EKG EKG Interpretation  Date/Time:  Tuesday September 24 2021 00:16:36 EDT Ventricular Rate:  60 PR Interval:  181 QRS Duration: 87 QT Interval:  418 QTC Calculation: 418 R Axis:   47 Text Interpretation: Sinus  rhythm Low voltage, precordial leads Confirmed by Thayer Jew [phone]) on 09/24/2021 2:50:23 AM  Radiology DG Thoracic Spine 2 View  Result Date: 09/24/2021 CLINICAL DATA:  T8 compression deformity EXAM: THORACIC SPINE 2 VIEWS COMPARISON:  Thoracic spine radiographs 06/10/2019 FINDINGS: Unchanged appearance of T8 compression deformity since 06/10/2019. No acute abnormality. IMPRESSION: Unchanged appearance of chronic T8 compression deformity. Electronically Signed   By: Ulyses Jarred M.D.   On: 09/24/2021 03:36   DG Ribs Unilateral W/Chest Right  Result Date: 09/24/2021 CLINICAL DATA:  Right chest pain. Shortness of breath, history of COPD. EXAM: RIGHT RIBS AND CHEST - 3+ VIEW COMPARISON:  04/22/2021.  FINDINGS: No fracture or other bone lesions are seen involving the ribs. There is a compression deformity in the superior endplate at T8 with loss of vertebral body height of approximately 40%. Which is new from the previous exam. There is hyperinflation of the lungs. No consolidation, effusion, or pneumothorax. Heart size and mediastinal contours are within normal limits. Surgical clips are present in the right upper quadrant. IMPRESSION: 1. No evidence of rib fracture. 2. Compression deformity in the superior endplate at T8 with loss of vertebral body height of approximately 40%, new from the previous exam. Electronically Signed   By: Brett Fairy M.D.   On: 09/24/2021 00:32    Procedures Procedures    Medications Ordered in ED Medications  ibuprofen (ADVIL) tablet 400 mg (400 mg Oral Given 09/24/21 0329)  oxyCODONE-acetaminophen (PERCOCET/ROXICET) 5-325 MG per tablet 1 tablet (1 tablet Oral Given 09/24/21 0329)    ED Course/ Medical Decision Making/ A&P                           Medical Decision Making Amount and/or Complexity of Data Reviewed Labs: ordered. Radiology: ordered.  Risk Prescription drug management.   This patient presents to the ED for concern of chest pain and back  pain, this involves an extensive number of treatment options, and is a complaint that carries with it a high risk of complications and morbidity.  I considered the following differential and admission for this acute, potentially life threatening condition.  The differential diagnosis includes atypical ACS, PE, musculoskeletal etiology, less likely urinary etiology such as kidney stone or UTI, low suspicion for dissection  MDM:    This is a 61 year old female who presents with back and left chest wall pain.  She is nontoxic-appearing and vital signs are reassuring.  She has reproducible pain on exam.  She denies trauma history.  She does describe some pleuritic nature to the pain but also pain with range of motion.  X-rays of the ribs do not show any evidence of fracture although she has what appears to be a new T8 compression fracture.  She denies any injury.  Suspect this is related to osteoporosis or osteopenia.  Her EKG shows no evidence of acute ischemia or arrhythmia.  Troponin is negative.  D-dimer is negative.  Thoracic x-rays confirm T8 compression fracture.  This is likely chronic in nature.  Patient receives ongoing narcotic prescriptions as an outpatient.  Can add anti-inflammatories.  Given chronicity and atraumatic nature of the compression fracture, do not feel TLSO brace is warranted.  Will provide neurosurgery follow-up if she has ongoing pain.  (Labs, imaging, consults)  Labs: I Ordered, and personally interpreted labs.  The pertinent results include: CBC, BMP, troponin, D-dimer  Imaging Studies ordered: I ordered imaging studies including chest x-ray, thoracic spine x-ray I independently visualized and interpreted imaging. I agree with the radiologist interpretation  Additional history obtained from review.  External records from outside source obtained and reviewed including narcotic drug database  Cardiac Monitoring: The patient was maintained on a cardiac monitor.  I  personally viewed and interpreted the cardiac monitored which showed an underlying rhythm of: Sinus rhythm  Reevaluation: After the interventions noted above, I reevaluated the patient and found that they have :improved  Social Determinants of Health: Lives independently  Disposition: Discharge  Co morbidities that complicate the patient evaluation  Past Medical History:  Diagnosis Date   Anxiety    B12 deficiency anemia  Chronic constipation    Chronic constipation    Chronic low back pain    lumbar radiculopathy   COPD (chronic obstructive pulmonary disease) (HCC)    Diabetes mellitus    DJD (degenerative joint disease)    GERD (gastroesophageal reflux disease) 04/13/07   EGD Dr Rourk->patulous EG junction, HH, antral erosions   H/O hiatal hernia    Hyperplastic colon polyp 06/27/2010   Hypertension    Migraines    Osteoarthritis    Raspy voice    Smokers' cough (HCC)    SUI (stress urinary incontinence, female)      Medicines Meds ordered this encounter  Medications   ibuprofen (ADVIL) tablet 400 mg   oxyCODONE-acetaminophen (PERCOCET/ROXICET) 5-325 MG per tablet 1 tablet    I have reviewed the patients home medicines and have made adjustments as needed  Problem List / ED Course: Problem List Items Addressed This Visit   None Visit Diagnoses     Compression fracture of T8 vertebra, initial encounter ([place])    -  Primary                   Final Clinical Impression(s) / ED Diagnoses Final diagnoses:  Compression fracture of T8 vertebra, initial encounter [facility])    Rx / [place] Orders ED Discharge Orders     None         Merryl Hacker, MD 09/24/21 [phone]

## 2021-09-24 NOTE — Discharge Instructions (Signed)
You were seen today for back and flank pain.  You have what appears to be a compression fracture in your back.  This is likely related to osteoporosis or osteopenia.  Continue your medications at home.  You may take Tylenol or ibuprofen as well.  Follow-up with neurosurgery for further options.

## 2021-09-24 NOTE — ED Notes (Signed)
Called lab for update on pending blood work.

## 2021-10-07 ENCOUNTER — Other Ambulatory Visit (HOSPITAL_COMMUNITY): Payer: Self-pay | Admitting: Neurosurgery

## 2021-10-07 DIAGNOSIS — M5416 Radiculopathy, lumbar region: Secondary | ICD-10-CM

## 2021-10-29 ENCOUNTER — Ambulatory Visit (HOSPITAL_COMMUNITY)
Admission: RE | Admit: 2021-10-29 | Discharge: 2021-10-29 | Disposition: A | Discharge status: Home / Self Care | Payer: 59 | Source: Ambulatory Visit | Attending: Neurosurgery | Admitting: Neurosurgery

## 2021-10-29 ENCOUNTER — Other Ambulatory Visit (HOSPITAL_COMMUNITY): Payer: Self-pay | Admitting: Nurse Practitioner

## 2021-10-29 DIAGNOSIS — M5416 Radiculopathy, lumbar region: Secondary | ICD-10-CM | POA: Diagnosis present

## 2021-10-29 DIAGNOSIS — Z1231 Encounter for screening mammogram for malignant neoplasm of breast: Secondary | ICD-10-CM

## 2021-10-29 MED ORDER — GADOBUTROL 1 MMOL/ML IV SOLN
6.0000 mL | Freq: Once | INTRAVENOUS | Status: AC | PRN
  Administered 2021-10-29: 6 mL via INTRAVENOUS

## 2021-11-06 ENCOUNTER — Ambulatory Visit (HOSPITAL_COMMUNITY): Payer: 59 | Admitting: Physical Therapy

## 2021-11-13 ENCOUNTER — Ambulatory Visit (HOSPITAL_COMMUNITY): Payer: 59

## 2021-12-03 ENCOUNTER — Ambulatory Visit (HOSPITAL_COMMUNITY): Payer: 59 | Admitting: Physical Therapy

## 2021-12-03 NOTE — Therapy (Incomplete)
OUTPATIENT PHYSICAL THERAPY THORACOLUMBAR EVALUATION   Patient Name: Tanya Velazquez MRN: [id number] DOB:27-Nov-1960, 61 y.o., female Today's Date: 12/03/2021  END OF SESSION:   Past Medical History:  Diagnosis Date   Anxiety    B12 deficiency anemia    Chronic constipation    Chronic constipation    Chronic low back pain    lumbar radiculopathy   COPD (chronic obstructive pulmonary disease) (HCC)    Diabetes mellitus    DJD (degenerative joint disease)    GERD (gastroesophageal reflux disease) 04/13/07   EGD Dr Rourk->patulous EG junction, HH, antral erosions   H/O hiatal hernia    Hyperplastic colon polyp 06/27/2010   Hypertension    Migraines    Osteoarthritis    Raspy voice    Smokers' cough ([place])    SUI (stress urinary incontinence, female)    Past Surgical History:  Procedure Laterality Date   APPENDECTOMY     BLADDER SUSPENSION  10/07/2011   Procedure: SPARC PROCEDURE;  Surgeon: Malka So, MD;  Location: [facility];  Service: Urology;  Laterality: N/A;  1 hour requested for this case    CARDIAC CATHETERIZATION  08-09-2004 DR Einar Gip   NORMAL CORONARY ARTERIES/  NORMAL LVF   CARDIAC CATHETERIZATION  2006   normal coronary arteries   COLONOSCOPY  3/31/9   Dr Rourk->friable anal canal   COLONOSCOPY  0 06/27/10   Dr. Gala Romney hyperplastic polyp, prominent external hemorrhoid plexus likely source of hematochezia, long tortuous colon. next tcs 06/2015.   COLONOSCOPY WITH PROPOFOL N/A 04/07/2019   Procedure: COLONOSCOPY WITH PROPOFOL;  Surgeon: Daneil Dolin, MD;  [facility];  Service: Endoscopy;  Laterality: N/A;  2:00pm - spoke w/pt, she knows to arrive at 11:30, she could not take the AM slot due to transportation   CYSTOSCOPY  10/07/2011   Procedure: CYSTOSCOPY;  Surgeon: Malka So, MD;  Location: [facility];  Service: Urology;  Laterality: N/A;   ESOPHAGEAL MANOMETRY  2013   Dr. Derrill Kay at [facility]. incomplete bolus  clearance with some breaks in contractions but there was peristalsis   ESOPHAGOGASTRODUODENOSCOPY  06/27/2010   Schatzki ring s/p dilation, small hiatal hernia, couple tiny antral erosions.   EXCISION VOLAR GANGLION LEFT WRIST  06-27-2005   HEAD-UP TILT TABLE TEST  01-21-2007 &  11-04-2004  DR WEINTRAUB   HX CHRONIC RECURRENT SYNCOPAL. EPICODES--  NEGATIVE RESULT INCLUDING ISUPREL INFUSION   HEMORRHOID SURGERY  09-13-2003   LAPAROSCOPIC CHOLECYSTECTOMY  02-23-2007   LAPAROTOMY W/ APPENDCTOMY AND LEFT SALPINGO-OOPHECTOMY  AGE 3'S   pH/impedence  2013   Dr. Derrill Kay at [facility]. Increased reflux off PPI with excellent correlation between reflux events and regurgitation. Patient advised to hold PPI for study by Dr. Derrill Kay.    POLYPECTOMY  04/07/2019   Procedure: POLYPECTOMY;  Surgeon: Daneil Dolin, MD;  [facility];  Service: Endoscopy;;   RIGHT SHOULDER ARTHROSCOPY/ DISTAL CLAVICLE RESECTION / [place] AND LABRUM TEAR/ BURSECTOMY  06-28-2010   IMPINGEMENT SYNDROME/ [facility] JOINT ARTHRITIS/ ROTATOR CUFF TENDINOPATHY   SHOULDER ARTHROSCOPY  08-13-2010   LEFT SHOULDER IMPINGEMENT SYNDROME/ [facility] OF [facility] JOINT   STOMACH SURGERY  2013   antireflux surgery at [facility]   TRANSFORAMINAL LUMBAR INTERBODY FUSION W/ MIS 1 LEVEL N/A 03/20/2020   Procedure: MINIMALLY INVASIVE (MIS) TRANSFORAMINAL LUMBAR INTERBODY FUSION (TLIF) LUMBAR FOUR-LUMBAR FIVE, RIGHT;  Surgeon: Vallarie Mare, MD;  Location: [place];  Service: Neurosurgery;  Laterality: N/A;  MINIMALLY INVASIVE (MIS)  TRANSFORAMINAL LUMBAR INTERBODY FUSION (TLIF) LUMBAR FOUR-LUMBAR FIVE, RIGHT   VAGINAL HYSTERECTOMY  age 28's   Patient Active Problem List   Diagnosis Date Noted   Lumbar radiculopathy 03/20/2020   AKI (acute kidney injury) ([place]) 06/11/2019   Syncope and collapse    Hypotension 06/09/2019   Prolapsed internal hemorrhoids, grade 3 05/20/2019   Rectal bleeding 02/04/2019   Rectal pain 02/04/2019   Syncope 10/07/2018    Metatarsal fracture 10/07/2018   Dizziness    Essential hypertension    Chest pain    Gastroesophageal reflux disease    Depression with anxiety    CKD (chronic kidney disease), symptom management only, stage 2 (mild)    Chronic renal failure, stage 3 (moderate)    Acute renal failure (ARF) ([place]) 08/09/2018   RUQ pain 04/10/2014   Abdominal pain 04/10/2014   Abnormal LFTs 04/10/2014   Intrahepatic bile duct dilation 04/10/2014   Common bile duct dilatation 04/10/2014   Constipation 06/11/2011   History of gastroesophageal reflux (GERD) 06/20/2010   Family history of colon cancer 06/20/2010    PCP: Emelia Loron NP  REFERRING PROVIDER: Vallarie Mare, MD  REFERRING DIAG: M54.9 back pain  Rationale for Evaluation and Treatment: Rehabilitation  THERAPY DIAG:  No diagnosis found.  ONSET DATE: ***  SUBJECTIVE:                                                                                                                                                                                           SUBJECTIVE STATEMENT: ***  PERTINENT HISTORY:  COPD, DM, HTN, T8 compression fracture, history of lumbar L4-L5 fusion  PAIN:  Are you having pain? {OPRCPAIN:27236}  PRECAUTIONS: {Therapy precautions:24002}  WEIGHT BEARING RESTRICTIONS: No  FALLS:  Has patient fallen in last 6 months? {fallsyesno:27318}  LIVING ENVIRONMENT: Lives with: {OPRC lives with:25569::"lives with their family"} Lives in: {Lives in:25570} Stairs: {opstairs:27293} Has following equipment at home: {Assistive devices:23999}  OCCUPATION: ***  PLOF: {PLOF:24004}  PATIENT GOALS: ***  NEXT MD VISIT:   OBJECTIVE:   DIAGNOSTIC FINDINGS:  ***  PATIENT SURVEYS:  {rehab surveys:24030}  SCREENING FOR RED FLAGS: Bowel or bladder incontinence: {Yes/No:304960894} Spinal tumors: {Yes/No:304960894} Cauda equina syndrome: {Yes/No:304960894} Compression fracture: {Yes/No:304960894} Abdominal  aneurysm: {Yes/No:304960894}  COGNITION: Overall cognitive status: {cognition:24006}     SENSATION: {sensation:27233}  MUSCLE LENGTH: Hamstrings: Right *** deg; Left *** deg Thomas test: Right *** deg; Left *** deg  POSTURE: {posture:25561}  PALPATION: ***  LUMBAR ROM:   AROM eval  Flexion   Extension   Right lateral flexion   Left lateral flexion   Right rotation   Left rotation    (  Blank rows = not tested)  LOWER EXTREMITY ROM:     Active  Right eval Left eval  Hip flexion    Hip extension    Hip abduction    Hip adduction    Hip internal rotation    Hip external rotation    Knee flexion    Knee extension    Ankle dorsiflexion    Ankle plantarflexion    Ankle inversion    Ankle eversion     (Blank rows = not tested)  LOWER EXTREMITY MMT:    MMT Right eval Left eval  Hip flexion    Hip extension    Hip abduction    Hip adduction    Hip internal rotation    Hip external rotation    Knee flexion    Knee extension    Ankle dorsiflexion    Ankle plantarflexion    Ankle inversion    Ankle eversion     (Blank rows = not tested)  LUMBAR SPECIAL TESTS:  {lumbar special test:25242}  FUNCTIONAL TESTS:  {Functional tests:24029}  GAIT: Distance walked: *** Assistive device utilized: {Assistive devices:23999} Level of assistance: {Levels of assistance:24026} Comments: ***  TODAY'S TREATMENT:                                                                                                                              DATE:  12/03/21 ***    PATIENT EDUCATION:  Education details: Patient educated on exam findings, POC, scope of PT, HEP, and ***. Person educated: Patient Education method: Explanation, Demonstration, and Handouts Education comprehension: verbalized understanding, returned demonstration, verbal cues required, and tactile cues required  HOME EXERCISE PROGRAM: ***  ASSESSMENT:  CLINICAL IMPRESSION: Patient a 61 y.o. y.o.  female who was seen today for physical therapy evaluation and treatment for back pain. Patient presents with pain limited deficits in back and *** strength, ROM, endurance, activity tolerance, and functional mobility with ADL. Patient is having to modify and restrict ADL as indicated by outcome measure score as well as subjective information and objective measures which is affecting overall participation. Patient will benefit from skilled physical therapy in order to improve function and reduce impairment.    OBJECTIVE IMPAIRMENTS: decreased activity tolerance, decreased endurance, decreased mobility, decreased ROM, decreased strength, increased muscle spasms, impaired flexibility, improper body mechanics, postural dysfunction, and pain.   ACTIVITY LIMITATIONS: carrying, lifting, bending, standing, squatting, reach over head, locomotion level, and caring for others  PARTICIPATION LIMITATIONS: meal prep, cleaning, laundry, shopping, community activity, and yard work  PERSONAL FACTORS: Time since onset of injury/illness/exacerbation and 3+ comorbidities: COPD, DM, HTN, T8 compression fracture, history of lumbar L4-L5 fusion  are also affecting patient's functional outcome.   REHAB POTENTIAL: {rehabpotential:25112}  CLINICAL DECISION MAKING: {clinical decision making:25114}  EVALUATION COMPLEXITY: {Evaluation complexity:25115}   GOALS: Goals reviewed with patient? Yes  SHORT TERM GOALS: Target date: ***  Patient will be independent with HEP in order to improve functional outcomes.  Baseline:  Goal status: INITIAL  2.  Patient will report at least 25% improvement in symptoms for improved quality of life. Baseline:  Goal status: INITIAL  3.  *** Baseline: *** Goal status: {GOALSTATUS:25110}  4.  *** Baseline: *** Goal status: {GOALSTATUS:25110}  5.  *** Baseline: *** Goal status: {GOALSTATUS:25110}  6.  *** Baseline: *** Goal status: {GOALSTATUS:25110}  LONG TERM GOALS:  Target date: ***  Patient will report at least 75% improvement in symptoms for improved quality of life. Baseline:  Goal status: INITIAL  2.  Patient will improve FOTO score by at least *** points in order to indicate improved tolerance to activity. Baseline: *** Goal status: INITIAL  3.  Patient will demonstrate at least 25% improvement in lumbar ROM in all restricted planes for improved ability to move trunk while completing ***. Baseline: *** Goal status: INITIAL  4.  Patient will be able to ambulate at least *** feet in 2MWT in order to demonstrate improved tolerance to activity. Baseline: *** Goal status: INITIAL  5.  Patient will demonstrate grade of 5/5 MMT grade in all tested musculature as evidence of improved strength to assist with stair ambulation and gait.   Baseline: *** Goal status: INITIAL  6.  *** Baseline: *** Goal status: {GOALSTATUS:25110}   PLAN:  PT FREQUENCY: {rehab frequency:25116}  PT DURATION: {rehab duration:25117}  PLANNED INTERVENTIONS: Therapeutic exercises, Therapeutic activity, Neuromuscular re-education, Balance training, Gait training, Patient/Family education, Joint manipulation, Joint mobilization, Stair training, Orthotic/Fit training, DME instructions, Aquatic Therapy, Dry Needling, Electrical stimulation, Spinal manipulation, Spinal mobilization, Cryotherapy, Moist heat, Compression bandaging, scar mobilization, Splintting, Taping, Traction, Ultrasound, Ionotophoresis '4mg'$ /ml Dexamethasone, and Manual therapy   PLAN FOR NEXT SESSION: ***   Vianne Bulls Elisandra Deshmukh, PT 12/03/2021, 7:17 AM

## 2022-03-07 ENCOUNTER — Other Ambulatory Visit (HOSPITAL_COMMUNITY): Payer: Self-pay | Admitting: Nurse Practitioner

## 2022-03-07 DIAGNOSIS — Z1231 Encounter for screening mammogram for malignant neoplasm of breast: Secondary | ICD-10-CM

## 2022-03-13 ENCOUNTER — Encounter: Payer: Self-pay | Admitting: Radiology

## 2022-03-14 ENCOUNTER — Ambulatory Visit (HOSPITAL_COMMUNITY): Payer: 59

## 2022-03-17 ENCOUNTER — Ambulatory Visit (HOSPITAL_COMMUNITY): Payer: 59 | Admitting: Psychiatry

## 2022-04-22 ENCOUNTER — Encounter: Payer: Self-pay | Admitting: *Deleted

## 2022-04-22 ENCOUNTER — Telehealth: Payer: Self-pay | Admitting: *Deleted

## 2022-04-22 ENCOUNTER — Ambulatory Visit (INDEPENDENT_AMBULATORY_CARE_PROVIDER_SITE_OTHER): Payer: 59 | Admitting: Internal Medicine

## 2022-04-22 VITALS — BP 105/65 | HR 73 | Temp 97.7°F | Ht 62.0 in | Wt 140.0 lb

## 2022-04-22 DIAGNOSIS — K625 Hemorrhage of anus and rectum: Secondary | ICD-10-CM | POA: Diagnosis not present

## 2022-04-22 DIAGNOSIS — K5909 Other constipation: Secondary | ICD-10-CM | POA: Diagnosis not present

## 2022-04-22 NOTE — Patient Instructions (Signed)
It was good to see you again today!  As discussed, due to rectal bleeding, we will schedule a diagnostic colonoscopy ASA 2.  Continue Linzess to 90 daily  Take Colace or other stool softener 100 mg twice daily every day  Pamphlet on hemorrhoid banding provided.  Further recommendations to follow.

## 2022-04-22 NOTE — Telephone Encounter (Signed)
[employer] PA: Notification or Prior Authorization is not required for the requested services You are not required to submit a notification/prior authorization based on the information provided. The number above acknowledges your inquiry and our response. Please write this number down and refer to it for future inquiries. If you still wish to submit your request for review, please select the Continue with Submission button below. Decision ID #: [id number]

## 2022-04-22 NOTE — Progress Notes (Unsigned)
Primary Care Physician:  Carmel Sacramento, NP Primary Gastroenterologist:  Dr. Jena Gauss  Pre-Procedure History & Physical: HPI:  Tanya Velazquez is a 62 y.o. female here for 2 and half month history of intermittent rectal bleeding.  Chronically constipated moving her bowels every other day on Linzess to 90 take stool softener sporadically.  Occasional straining.  Has known grade 3 hemorrhoids.  Positive family history colon cancer mother who was diagnosed in her 20s.  Last colonoscopy 3 years ago grade 3 hemorrhoids and hyperplastic polyps  Past Medical History:  Diagnosis Date   Anxiety    B12 deficiency anemia    Chronic constipation    Chronic constipation    Chronic low back pain    lumbar radiculopathy   COPD (chronic obstructive pulmonary disease) (HCC)    Diabetes mellitus    DJD (degenerative joint disease)    GERD (gastroesophageal reflux disease) 04/13/07   EGD Dr Jamarius Saha->patulous EG junction, HH, antral erosions   H/O hiatal hernia    Hyperplastic colon polyp 06/27/2010   Hypertension    Migraines    Osteoarthritis    Raspy voice    Smokers' cough (HCC)    SUI (stress urinary incontinence, female)     Past Surgical History:  Procedure Laterality Date   APPENDECTOMY     BLADDER SUSPENSION  10/07/2011   Procedure: SPARC PROCEDURE;  Surgeon: Anner Crete, MD;  Location: [facility];  Service: Urology;  Laterality: N/A;  1 hour requested for this case    CARDIAC CATHETERIZATION  08-09-2004 DR Jacinto Halim   NORMAL CORONARY ARTERIES/  NORMAL LVF   CARDIAC CATHETERIZATION  2006   normal coronary arteries   COLONOSCOPY  3/31/9   Dr Keyonni Percival->friable anal canal   COLONOSCOPY  0 06/27/10   Dr. Jena Gauss hyperplastic polyp, prominent external hemorrhoid plexus likely source of hematochezia, long tortuous colon. next tcs 06/2015.   COLONOSCOPY WITH PROPOFOL N/A 04/07/2019   Procedure: COLONOSCOPY WITH PROPOFOL;  Surgeon: Corbin Ade, MD;  [facility];  Service:  Endoscopy;  Laterality: N/A;  2:00pm - spoke w/pt, she knows to arrive at 11:30, she could not take the AM slot due to transportation   CYSTOSCOPY  10/07/2011   Procedure: CYSTOSCOPY;  Surgeon: Anner Crete, MD;  Location: [facility];  Service: Urology;  Laterality: N/A;   ESOPHAGEAL MANOMETRY  2013   Dr. Alycia Rossetti at [facility]. incomplete bolus clearance with some breaks in contractions but there was peristalsis   ESOPHAGOGASTRODUODENOSCOPY  06/27/2010   Schatzki ring s/p dilation, small hiatal hernia, couple tiny antral erosions.   EXCISION VOLAR GANGLION LEFT WRIST  06-27-2005   HEAD-UP TILT TABLE TEST  01-21-2007 &  11-04-2004  DR WEINTRAUB   HX CHRONIC RECURRENT SYNCOPAL. EPICODES--  NEGATIVE RESULT INCLUDING ISUPREL INFUSION   HEMORRHOID SURGERY  09-13-2003   LAPAROSCOPIC CHOLECYSTECTOMY  02-23-2007   LAPAROTOMY W/ APPENDCTOMY AND LEFT SALPINGO-OOPHECTOMY  AGE 53'S   pH/impedence  2013   Dr. Alycia Rossetti at [facility]. Increased reflux off PPI with excellent correlation between reflux events and regurgitation. Patient advised to hold PPI for study by Dr. Alycia Rossetti.    POLYPECTOMY  04/07/2019   Procedure: POLYPECTOMY;  Surgeon: Corbin Ade, MD;  [facility];  Service: Endoscopy;;   RIGHT SHOULDER ARTHROSCOPY/ DISTAL CLAVICLE RESECTION / DEBRIDEMENT ROTATOR CUFF AND LABRUM TEAR/ BURSECTOMY  06-28-2010   IMPINGEMENT SYNDROME/ [facility] JOINT ARTHRITIS/ ROTATOR CUFF TENDINOPATHY   SHOULDER ARTHROSCOPY  08-13-2010   LEFT  SHOULDER IMPINGEMENT SYNDROME/ DJD OF [facility] JOINT   STOMACH SURGERY  2013   antireflux surgery at [facility]   TRANSFORAMINAL LUMBAR INTERBODY FUSION W/ MIS 1 LEVEL N/A 03/20/2020   Procedure: MINIMALLY INVASIVE (MIS) TRANSFORAMINAL LUMBAR INTERBODY FUSION (TLIF) LUMBAR FOUR-LUMBAR FIVE, RIGHT;  Surgeon: Bedelia Person, MD;  Location: [facility];  Service: Neurosurgery;  Laterality: N/A;  MINIMALLY INVASIVE (MIS) TRANSFORAMINAL LUMBAR INTERBODY FUSION (TLIF) LUMBAR FOUR-LUMBAR FIVE,  RIGHT   VAGINAL HYSTERECTOMY  age 33's    Prior to Admission medications   Medication Sig Start Date End Date Taking? Authorizing Provider  buPROPion (WELLBUTRIN SR) 150 MG 12 hr tablet Take 150 mg by mouth at bedtime.  05/27/19  Yes [provider]  busPIRone (BUSPAR) 15 MG tablet Take 15 mg by mouth 2 (two) times daily.   Yes [provider]  Cholecalciferol (VITAMIN D3) 50 MCG (2000 UT) TABS Take 2,000 Units by mouth in the morning.    Yes [provider]  cyanocobalamin 1000 MCG tablet Take 1 tablet (1,000 mcg total) by mouth 2 (two) times daily.   Yes [provider]  docusate sodium (COLACE) 100 MG capsule Take 1 capsule (100 mg total) by mouth 2 (two) times daily. 03/21/20  Yes Bedelia Person, MD  famotidine (PEPCID) 20 MG tablet Take 1 tablet (20 mg total) by mouth daily. 06/13/19  Yes Vassie Loll, MD  gabapentin (NEURONTIN) 300 MG capsule Take 300 mg by mouth 3 (three) times daily.    Yes [provider]  ipratropium-albuterol (DUONEB) 0.5-2.5 (3) MG/3ML SOLN Inhale 3 mLs into the lungs every 6 (six) hours as needed for wheezing or shortness of breath. 03/10/19  Yes [provider]  LINZESS 290 MCG CAPS capsule TAKE 1 CAPSULE BY MOUTH ONCE DAILY BEFORE BREAKFAST. 01/10/21  Yes Tiffany Kocher, PA-C  mirtazapine (REMERON) 7.5 MG tablet Take 7.5 mg by mouth at bedtime. 03/12/20  Yes [provider]  Oxycodone HCl 10 MG TABS Take 10 mg by mouth 3 (three) times daily as needed. 01/31/21  Yes [provider]  oxyCODONE-acetaminophen (PERCOCET/ROXICET) 5-325 MG tablet Take 1-2 tablets by mouth every 4 (four) hours as needed for moderate pain or severe pain. 03/21/20  Yes Bedelia Person, MD  PROAIR HFA 108 (90 BASE) MCG/ACT inhaler Inhale 1 puff into the lungs every 4 (four) hours as needed for wheezing or shortness of breath. 05/13/10  Yes [provider]  rosuvastatin (CRESTOR) 20 MG tablet Take 20 mg by mouth at  bedtime. 02/08/21  Yes [provider]  TRELEGY ELLIPTA 100-62.5-25 MCG/INH AEPB Inhale 1 puff into the lungs daily. 03/11/18  Yes [provider]  TRULICITY 0.75 MG/0.5ML SOPN Inject into the skin. 02/25/21  Yes [provider]  venlafaxine (EFFEXOR) 75 MG tablet Take 75 mg by mouth 2 (two) times daily.   Yes [provider]  Venlafaxine HCl 150 MG TB24 Take 225 mg by mouth daily with breakfast.   Yes [provider]  cyclobenzaprine (FLEXERIL) 10 MG tablet Take 1 tablet (10 mg total) by mouth 3 (three) times daily as needed for muscle spasms. Patient not taking: Reported on 04/22/2022 03/21/20   Bedelia Person, MD  hydrocortisone (ANUSOL-HC) 2.5 % rectal cream Place 1 application rectally 2 (two) times daily. Patient not taking: Reported on 04/22/2022 02/04/19   Letta Median, PA-C    Allergies as of 04/22/2022 - Review Complete 04/22/2022  Allergen Reaction Noted   Amicinonide-benzyl alcohol [cyclocort] Rash 06/20/2010  Amoxicillin Rash 10/01/2011   Doxycycline Rash 06/20/2010   Paxil [paroxetine hcl] Rash 05/29/2016   Penicillins Rash 03/10/2011   Sulfa antibiotics Rash 04/10/2014   Trimethoprim Rash 06/20/2010    Family History  Problem Relation Age of Onset   Colon cancer Mother 86   Hypertension Mother    Heart disease Mother 70       CABG   Alzheimer's disease Father    Hypertension Father    Healthy Daughter    Heart failure Sister    Heart Problems Sister        Cardiac stent    Social History   Socioeconomic History   Marital status: Divorced    Spouse name: Not on file   Number of children: 3   Years of education: Not on file   Highest education level: Not on file  Occupational History   Occupation: disabled    Employer: UNEMPLOYED  Tobacco Use   Smoking status: Some Days    Packs/day: 1.50    Years: 35.00    Additional pack years: 0.00    Total pack years: 52.50    Types: Cigarettes   Smokeless tobacco:  Never   Tobacco comments:    03/2020 down to 1/2 PPD  Vaping Use   Vaping Use: Never used  Substance and Sexual Activity   Alcohol use: No    Alcohol/week: 0.0 standard drinks of alcohol   Drug use: No   Sexual activity: Yes    Birth control/protection: Surgical  Other Topics Concern   Not on file  Social History Narrative   Lives alone in a one story home.  Has 2 children.     On disability since 2007 for syncope.  Did work in Designer, fashion/clothing.     Education: high school.   Social Determinants of Health   Financial Resource Strain: Not on file  Food Insecurity: Not on file  Transportation Needs: Not on file  Physical Activity: Not on file  Stress: Not on file  Social Connections: Not on file  Intimate Partner Violence: Not on file    Review of Systems: See HPI, otherwise negative ROS  Physical Exam: BP 105/65 (BP Location: Left Arm, Patient Position: Sitting, Cuff Size: Normal)   Pulse 73   Temp 97.7 F (36.5 C) (Temporal)   Ht 5\' 2"  (1.575 m)   Wt 140 lb (63.5 kg)   SpO2 97%   BMI 25.61 kg/m  General:   Alert,   pleasant and cooperative in NAD Lungs:  Clear throughout to auscultation.   No wheezes, crackles, or rhonchi. No acute distress. Heart:  Regular rate and rhythm; no murmurs, clicks, rubs,  or gallops. Abdomen: Non-distended, normal bowel sounds.  Soft and nontender without appreciable mass or hepatosplenomegaly.  Pulses:  Normal pulses noted. Extremities:  Without clubbing or edema.  Impression/Plan: Pleasant 62 year old lady with known grade 3 hemorrhoids presents with intermittent hematochezia in the setting of chronic constipation a positive family history colon cancer first-degree relative.  I suspect hemorrhoids.  However, cannot discount positive family history.  It is good to know she had a colonoscopy 3 years ago without high-grade pathology being found.  Having said that, updated evaluation is warranted.  Recommendations:  As discussed, due to rectal  bleeding, we will schedule a diagnostic colonoscopy ASA 2.  The risks, benefits, limitations, alternatives and imponderables have been reviewed with the patient. Questions have been answered. All parties are agreeable.    Continue Linzess to 90 daily  Take Colace  or other stool softener 100 mg twice daily every day  Pamphlet on hemorrhoid banding provided.  Further recommendations to follow.   Notice: This dictation was prepared with Dragon dictation along with smaller phrase technology. Any transcriptional errors that result from this process are unintentional and may not be corrected upon review.

## 2022-04-22 NOTE — H&P (View-Only) (Signed)
  Primary Care Physician:  Terry, Tyler, NP Primary Gastroenterologist:  Dr. Deloise Marchant  Pre-Procedure History & Physical: HPI:  Tanya Velazquez is a 61 y.o. female here for 2 and half month history of intermittent rectal bleeding.  Chronically constipated moving her bowels every other day on Linzess to 90 take stool softener sporadically.  Occasional straining.  Has known grade 3 hemorrhoids.  Positive family history colon cancer mother who was diagnosed in her 60s.  Last colonoscopy 3 years ago grade 3 hemorrhoids and hyperplastic polyps  Past Medical History:  Diagnosis Date   Anxiety    B12 deficiency anemia    Chronic constipation    Chronic constipation    Chronic low back pain    lumbar radiculopathy   COPD (chronic obstructive pulmonary disease) (HCC)    Diabetes mellitus    DJD (degenerative joint disease)    GERD (gastroesophageal reflux disease) 04/13/07   EGD Dr Beauford Lando->patulous EG junction, HH, antral erosions   H/O hiatal hernia    Hyperplastic colon polyp 06/27/2010   Hypertension    Migraines    Osteoarthritis    Raspy voice    Smokers' cough (HCC)    SUI (stress urinary incontinence, female)     Past Surgical History:  Procedure Laterality Date   APPENDECTOMY     BLADDER SUSPENSION  10/07/2011   Procedure: SPARC PROCEDURE;  Surgeon: John J Wrenn, MD;  Location: [facility];  Service: Urology;  Laterality: N/A;  1 hour requested for this case    CARDIAC CATHETERIZATION  08-09-2004 DR GANJI   NORMAL CORONARY ARTERIES/  NORMAL LVF   CARDIAC CATHETERIZATION  2006   normal coronary arteries   COLONOSCOPY  3/31/9   Dr Seon Gaertner->friable anal canal   COLONOSCOPY  0 06/27/10   Dr. Aemilia Dedrick-> hyperplastic polyp, prominent external hemorrhoid plexus likely source of hematochezia, long tortuous colon. next tcs 06/2015.   COLONOSCOPY WITH PROPOFOL N/A 04/07/2019   Procedure: COLONOSCOPY WITH PROPOFOL;  Surgeon: Bryon Parker M, MD;  [facility];  Service:  Endoscopy;  Laterality: N/A;  2:00pm - spoke w/pt, she knows to arrive at 11:30, she could not take the AM slot due to transportation   CYSTOSCOPY  10/07/2011   Procedure: CYSTOSCOPY;  Surgeon: John J Wrenn, MD;  Location: [facility];  Service: Urology;  Laterality: N/A;   ESOPHAGEAL MANOMETRY  2013   Dr. Koch at [facility]. incomplete bolus clearance with some breaks in contractions but there was peristalsis   ESOPHAGOGASTRODUODENOSCOPY  06/27/2010   Schatzki ring s/p dilation, small hiatal hernia, couple tiny antral erosions.   EXCISION VOLAR GANGLION LEFT WRIST  06-27-2005   HEAD-UP TILT TABLE TEST  01-21-2007 &  11-04-2004  DR WEINTRAUB   HX CHRONIC RECURRENT SYNCOPAL. EPICODES--  NEGATIVE RESULT INCLUDING ISUPREL INFUSION   HEMORRHOID SURGERY  09-13-2003   LAPAROSCOPIC CHOLECYSTECTOMY  02-23-2007   LAPAROTOMY W/ APPENDCTOMY AND LEFT SALPINGO-OOPHECTOMY  AGE 30'S   pH/impedence  2013   Dr. Koch at [facility]. Increased reflux off PPI with excellent correlation between reflux events and regurgitation. Patient advised to hold PPI for study by Dr. Koch.    POLYPECTOMY  04/07/2019   Procedure: POLYPECTOMY;  Surgeon: Joesiah Lonon M, MD;  [facility];  Service: Endoscopy;;   RIGHT SHOULDER ARTHROSCOPY/ DISTAL CLAVICLE RESECTION / DEBRIDEMENT ROTATOR CUFF AND LABRUM TEAR/ BURSECTOMY  06-28-2010   IMPINGEMENT SYNDROME/ AC JOINT ARTHRITIS/ ROTATOR CUFF TENDINOPATHY   SHOULDER ARTHROSCOPY  08-13-2010   LEFT   SHOULDER IMPINGEMENT SYNDROME/ DJD OF AC JOINT   STOMACH SURGERY  2013   antireflux surgery at [facility]   TRANSFORAMINAL LUMBAR INTERBODY FUSION W/ MIS 1 LEVEL N/A 03/20/2020   Procedure: MINIMALLY INVASIVE (MIS) TRANSFORAMINAL LUMBAR INTERBODY FUSION (TLIF) LUMBAR FOUR-LUMBAR FIVE, RIGHT;  Surgeon: Thomas, Jonathan G, MD;  Location: [facility];  Service: Neurosurgery;  Laterality: N/A;  MINIMALLY INVASIVE (MIS) TRANSFORAMINAL LUMBAR INTERBODY FUSION (TLIF) LUMBAR FOUR-LUMBAR FIVE,  RIGHT   VAGINAL HYSTERECTOMY  age 20's    Prior to Admission medications   Medication Sig Start Date End Date Taking? Authorizing Provider  buPROPion (WELLBUTRIN SR) 150 MG 12 hr tablet Take 150 mg by mouth at bedtime.  05/27/19  Yes [provider]  busPIRone (BUSPAR) 15 MG tablet Take 15 mg by mouth 2 (two) times daily.   Yes [provider]  Cholecalciferol (VITAMIN D3) 50 MCG (2000 UT) TABS Take 2,000 Units by mouth in the morning.    Yes [provider]  cyanocobalamin 1000 MCG tablet Take 1 tablet (1,000 mcg total) by mouth 2 (two) times daily.   Yes [provider]  docusate sodium (COLACE) 100 MG capsule Take 1 capsule (100 mg total) by mouth 2 (two) times daily. 03/21/20  Yes Thomas, Jonathan G, MD  famotidine (PEPCID) 20 MG tablet Take 1 tablet (20 mg total) by mouth daily. 06/13/19  Yes Madera, Carlos, MD  gabapentin (NEURONTIN) 300 MG capsule Take 300 mg by mouth 3 (three) times daily.    Yes [provider]  ipratropium-albuterol (DUONEB) 0.5-2.5 (3) MG/3ML SOLN Inhale 3 mLs into the lungs every 6 (six) hours as needed for wheezing or shortness of breath. 03/10/19  Yes [provider]  LINZESS 290 MCG CAPS capsule TAKE 1 CAPSULE BY MOUTH ONCE DAILY BEFORE BREAKFAST. 01/10/21  Yes Lewis, Leslie S, PA-C  mirtazapine (REMERON) 7.5 MG tablet Take 7.5 mg by mouth at bedtime. 03/12/20  Yes [provider]  Oxycodone HCl 10 MG TABS Take 10 mg by mouth 3 (three) times daily as needed. 01/31/21  Yes [provider]  oxyCODONE-acetaminophen (PERCOCET/ROXICET) 5-325 MG tablet Take 1-2 tablets by mouth every 4 (four) hours as needed for moderate pain or severe pain. 03/21/20  Yes Thomas, Jonathan G, MD  PROAIR HFA 108 (90 BASE) MCG/ACT inhaler Inhale 1 puff into the lungs every 4 (four) hours as needed for wheezing or shortness of breath. 05/13/10  Yes [provider]  rosuvastatin (CRESTOR) 20 MG tablet Take 20 mg by mouth at  bedtime. 02/08/21  Yes [provider]  TRELEGY ELLIPTA 100-62.5-25 MCG/INH AEPB Inhale 1 puff into the lungs daily. 03/11/18  Yes [provider]  TRULICITY 0.75 MG/0.5ML SOPN Inject into the skin. 02/25/21  Yes [provider]  venlafaxine (EFFEXOR) 75 MG tablet Take 75 mg by mouth 2 (two) times daily.   Yes [provider]  Venlafaxine HCl 150 MG TB24 Take 225 mg by mouth daily with breakfast.   Yes [provider]  cyclobenzaprine (FLEXERIL) 10 MG tablet Take 1 tablet (10 mg total) by mouth 3 (three) times daily as needed for muscle spasms. Patient not taking: Reported on 04/22/2022 03/21/20   Thomas, Jonathan G, MD  hydrocortisone (ANUSOL-HC) 2.5 % rectal cream Place 1 application rectally 2 (two) times daily. Patient not taking: Reported on 04/22/2022 02/04/19   Harper, Kristen S, PA-C    Allergies as of 04/22/2022 - Review Complete 04/22/2022  Allergen Reaction Noted   Amicinonide-benzyl alcohol [cyclocort] Rash 06/20/2010     Amoxicillin Rash 10/01/2011   Doxycycline Rash 06/20/2010   Paxil [paroxetine hcl] Rash 05/29/2016   Penicillins Rash 03/10/2011   Sulfa antibiotics Rash 04/10/2014   Trimethoprim Rash 06/20/2010    Family History  Problem Relation Age of Onset   Colon cancer Mother 74   Hypertension Mother    Heart disease Mother 50       CABG   Alzheimer's disease Father    Hypertension Father    Healthy Daughter    Heart failure Sister    Heart Problems Sister        Cardiac stent    Social History   Socioeconomic History   Marital status: Divorced    Spouse name: Not on file   Number of children: 3   Years of education: Not on file   Highest education level: Not on file  Occupational History   Occupation: disabled    Employer: UNEMPLOYED  Tobacco Use   Smoking status: Some Days    Packs/day: 1.50    Years: 35.00    Additional pack years: 0.00    Total pack years: 52.50    Types: Cigarettes   Smokeless tobacco:  Never   Tobacco comments:    03/2020 down to 1/2 PPD  Vaping Use   Vaping Use: Never used  Substance and Sexual Activity   Alcohol use: No    Alcohol/week: 0.0 standard drinks of alcohol   Drug use: No   Sexual activity: Yes    Birth control/protection: Surgical  Other Topics Concern   Not on file  Social History Narrative   Lives alone in a one story home.  Has 2 children.     On disability since 2007 for syncope.  Did work in textiles.     Education: high school.   Social Determinants of Health   Financial Resource Strain: Not on file  Food Insecurity: Not on file  Transportation Needs: Not on file  Physical Activity: Not on file  Stress: Not on file  Social Connections: Not on file  Intimate Partner Violence: Not on file    Review of Systems: See HPI, otherwise negative ROS  Physical Exam: BP 105/65 (BP Location: Left Arm, Patient Position: Sitting, Cuff Size: Normal)   Pulse 73   Temp 97.7 F (36.5 C) (Temporal)   Ht 5' 2" (1.575 m)   Wt 140 lb (63.5 kg)   SpO2 97%   BMI 25.61 kg/m  General:   Alert,   pleasant and cooperative in NAD Lungs:  Clear throughout to auscultation.   No wheezes, crackles, or rhonchi. No acute distress. Heart:  Regular rate and rhythm; no murmurs, clicks, rubs,  or gallops. Abdomen: Non-distended, normal bowel sounds.  Soft and nontender without appreciable mass or hepatosplenomegaly.  Pulses:  Normal pulses noted. Extremities:  Without clubbing or edema.  Impression/Plan: Pleasant 61-year-old lady with known grade 3 hemorrhoids presents with intermittent hematochezia in the setting of chronic constipation a positive family history colon cancer first-degree relative.  I suspect hemorrhoids.  However, cannot discount positive family history.  It is good to know she had a colonoscopy 3 years ago without high-grade pathology being found.  Having said that, updated evaluation is warranted.  Recommendations:  As discussed, due to rectal  bleeding, we will schedule a diagnostic colonoscopy ASA 2.  The risks, benefits, limitations, alternatives and imponderables have been reviewed with the patient. Questions have been answered. All parties are agreeable.    Continue Linzess to 90 daily  Take Colace   or other stool softener 100 mg twice daily every day  Pamphlet on hemorrhoid banding provided.  Further recommendations to follow.   Notice: This dictation was prepared with Dragon dictation along with smaller phrase technology. Any transcriptional errors that result from this process are unintentional and may not be corrected upon review.  

## 2022-04-24 ENCOUNTER — Ambulatory Visit (HOSPITAL_COMMUNITY)
Admission: RE | Admit: 2022-04-24 | Discharge: 2022-04-24 | Disposition: A | Discharge status: Home / Self Care | Payer: 59 | Source: Ambulatory Visit | Attending: Nurse Practitioner | Admitting: Nurse Practitioner

## 2022-04-24 DIAGNOSIS — Z122 Encounter for screening for malignant neoplasm of respiratory organs: Secondary | ICD-10-CM | POA: Insufficient documentation

## 2022-04-24 DIAGNOSIS — Z87891 Personal history of nicotine dependence: Secondary | ICD-10-CM | POA: Insufficient documentation

## 2022-04-24 DIAGNOSIS — F17210 Nicotine dependence, cigarettes, uncomplicated: Secondary | ICD-10-CM | POA: Insufficient documentation

## 2022-04-29 ENCOUNTER — Other Ambulatory Visit: Payer: Self-pay

## 2022-04-29 DIAGNOSIS — Z87891 Personal history of nicotine dependence: Secondary | ICD-10-CM

## 2022-04-29 DIAGNOSIS — Z122 Encounter for screening for malignant neoplasm of respiratory organs: Secondary | ICD-10-CM

## 2022-05-16 ENCOUNTER — Other Ambulatory Visit: Payer: Self-pay | Admitting: *Deleted

## 2022-05-16 MED ORDER — PEG 3350-KCL-NA BICARB-NACL 420 G PO SOLR: 4000.0000 mL | Freq: Once | ORAL | 0 refills | Status: AC

## 2022-05-22 ENCOUNTER — Ambulatory Visit (HOSPITAL_COMMUNITY)
Admission: RE | Admit: 2022-05-22 | Discharge: 2022-05-22 | Disposition: A | Discharge status: Home / Self Care | Payer: 59 | Attending: Internal Medicine | Admitting: Internal Medicine

## 2022-05-22 ENCOUNTER — Encounter (HOSPITAL_COMMUNITY): Payer: Self-pay | Admitting: Internal Medicine

## 2022-05-22 ENCOUNTER — Ambulatory Visit (HOSPITAL_BASED_OUTPATIENT_CLINIC_OR_DEPARTMENT_OTHER): Payer: 59 | Admitting: Anesthesiology

## 2022-05-22 ENCOUNTER — Encounter (HOSPITAL_COMMUNITY)
Admission: RE | Disposition: A | Discharge status: Home / Self Care | Payer: Self-pay | Source: Home / Self Care | Attending: Internal Medicine

## 2022-05-22 ENCOUNTER — Other Ambulatory Visit: Payer: Self-pay

## 2022-05-22 ENCOUNTER — Ambulatory Visit (HOSPITAL_COMMUNITY): Payer: 59 | Admitting: Anesthesiology

## 2022-05-22 DIAGNOSIS — K649 Unspecified hemorrhoids: Secondary | ICD-10-CM | POA: Diagnosis not present

## 2022-05-22 DIAGNOSIS — Z800 Family history of malignant neoplasm of digestive organs: Secondary | ICD-10-CM | POA: Insufficient documentation

## 2022-05-22 DIAGNOSIS — K642 Third degree hemorrhoids: Secondary | ICD-10-CM | POA: Diagnosis not present

## 2022-05-22 DIAGNOSIS — J449 Chronic obstructive pulmonary disease, unspecified: Secondary | ICD-10-CM

## 2022-05-22 DIAGNOSIS — D127 Benign neoplasm of rectosigmoid junction: Secondary | ICD-10-CM | POA: Insufficient documentation

## 2022-05-22 DIAGNOSIS — E119 Type 2 diabetes mellitus without complications: Secondary | ICD-10-CM | POA: Diagnosis not present

## 2022-05-22 DIAGNOSIS — K635 Polyp of colon: Secondary | ICD-10-CM

## 2022-05-22 DIAGNOSIS — I10 Essential (primary) hypertension: Secondary | ICD-10-CM

## 2022-05-22 DIAGNOSIS — Z7985 Long-term (current) use of injectable non-insulin antidiabetic drugs: Secondary | ICD-10-CM | POA: Insufficient documentation

## 2022-05-22 DIAGNOSIS — Z79899 Other long term (current) drug therapy: Secondary | ICD-10-CM | POA: Diagnosis not present

## 2022-05-22 DIAGNOSIS — F418 Other specified anxiety disorders: Secondary | ICD-10-CM | POA: Diagnosis not present

## 2022-05-22 DIAGNOSIS — F17210 Nicotine dependence, cigarettes, uncomplicated: Secondary | ICD-10-CM | POA: Insufficient documentation

## 2022-05-22 DIAGNOSIS — Z8719 Personal history of other diseases of the digestive system: Secondary | ICD-10-CM | POA: Insufficient documentation

## 2022-05-22 DIAGNOSIS — K921 Melena: Secondary | ICD-10-CM | POA: Diagnosis not present

## 2022-05-22 DIAGNOSIS — Z09 Encounter for follow-up examination after completed treatment for conditions other than malignant neoplasm: Secondary | ICD-10-CM | POA: Diagnosis not present

## 2022-05-22 DIAGNOSIS — K5909 Other constipation: Secondary | ICD-10-CM | POA: Insufficient documentation

## 2022-05-22 HISTORY — PX: POLYPECTOMY: SHX5525

## 2022-05-22 HISTORY — PX: COLONOSCOPY WITH PROPOFOL: SHX5780

## 2022-05-22 SURGERY — COLONOSCOPY WITH PROPOFOL
Anesthesia: General

## 2022-05-22 MED ORDER — PROPOFOL 500 MG/50ML IV EMUL
INTRAVENOUS | Status: DC | PRN
  Administered 2022-05-22: 80 ug/kg/min via INTRAVENOUS
  Administered 2022-05-22: 50 mg via INTRAVENOUS

## 2022-05-22 MED ORDER — LACTATED RINGERS IV SOLN: INTRAVENOUS | Status: DC

## 2022-05-22 MED ORDER — STERILE WATER FOR IRRIGATION IR SOLN
Status: DC | PRN
  Administered 2022-05-22: 60 mL

## 2022-05-22 MED ORDER — PROPOFOL 500 MG/50ML IV EMUL
INTRAVENOUS | Status: AC
  Filled 2022-05-22: qty 50

## 2022-05-22 NOTE — Discharge Instructions (Signed)
  Colonoscopy Discharge Instructions  Read the instructions outlined below and refer to this sheet in the next few weeks. These discharge instructions provide you with general information on caring for yourself after you leave the hospital. Your doctor may also give you specific instructions. While your treatment has been planned according to the most current medical practices available, unavoidable complications occasionally occur. If you have any problems or questions after discharge, call Dr. Jena Gauss at [phone]. ACTIVITY You may resume your regular activity, but move at a slower pace for the next 24 hours.  Take frequent rest periods for the next 24 hours.  Walking will help get rid of the air and reduce the bloated feeling in your belly (abdomen).  No driving for 24 hours (because of the medicine (anesthesia) used during the test).   Do not sign any important legal documents or operate any machinery for 24 hours (because of the anesthesia used during the test).  NUTRITION Drink plenty of fluids.  You may resume your normal diet as instructed by your doctor.  Begin with a light meal and progress to your normal diet. Heavy or fried foods are harder to digest and may make you feel sick to your stomach (nauseated).  Avoid alcoholic beverages for 24 hours or as instructed.  MEDICATIONS You may resume your normal medications unless your doctor tells you otherwise.  WHAT YOU CAN EXPECT TODAY Some feelings of bloating in the abdomen.  Passage of more gas than usual.  Spotting of blood in your stool or on the toilet paper.  IF YOU HAD POLYPS REMOVED DURING THE COLONOSCOPY: No aspirin products for 7 days or as instructed.  No alcohol for 7 days or as instructed.  Eat a soft diet for the next 24 hours.  FINDING OUT THE RESULTS OF YOUR TEST Not all test results are available during your visit. If your test results are not back during the visit, make an appointment with your caregiver to find out the  results. Do not assume everything is normal if you have not heard from your caregiver or the medical facility. It is important for you to follow up on all of your test results.  SEEK IMMEDIATE MEDICAL ATTENTION IF: You have more than a spotting of blood in your stool.  Your belly is swollen (abdominal distention).  You are nauseated or vomiting.  You have a temperature over 101.  You have abdominal pain or discomfort that is severe or gets worse throughout the day.     You had 1 small polyp removed.  You have prominent hemorrhoids.  They would be treatable via banding in the office.  Pamphlet on hemorrhoid banding provided  Further recommendations to follow pending review of pathology report  Office visit with Lewie Loron in 6 weeks for possible hemorrhoid banding  At patient request, I called Tammy Shumaker at [phone] -reviewed findings.

## 2022-05-22 NOTE — Op Note (Signed)
[facility] Patient Name: Tanya Velazquez Procedure Date: 05/22/2022 7:10 AM MRN: [id number] Date of Birth: November 17, 1960 Attending MD: Gennette Pac , MD, [id number] CSN: [id number] Age: 62 Admit Type: Outpatient Procedure:                Colonoscopy Indications:              Hematochezia Providers:                Gennette Pac, MD, Angelica Ran, Cyril Mourning, Technician Referring MD:              Medicines:                Propofol per Anesthesia Complications:            No immediate complications. Estimated Blood Loss:     Estimated blood loss was minimal. Procedure:                Pre-Anesthesia Assessment:                           - Prior to the procedure, a History and Physical                            was performed, and patient medications and                            allergies were reviewed. The patient's tolerance of                            previous anesthesia was also reviewed. The risks                            and benefits of the procedure and the sedation                            options and risks were discussed with the patient.                            All questions were answered, and informed consent                            was obtained. Prior Anticoagulants: The patient has                            taken no anticoagulant or antiplatelet agents. ASA                            Grade Assessment: III - A patient with severe                            systemic disease. After reviewing the risks and  benefits, the patient was deemed in satisfactory                            condition to undergo the procedure.                           After obtaining informed consent, the colonoscope                            was passed under direct vision. Throughout the                            procedure, the patient's blood pressure, pulse, and                            oxygen saturations  were monitored continuously. The                            [id number]) scope was introduced through                            the anus and advanced to the the cecum, identified                            by appendiceal orifice and ileocecal valve. The                            colonoscopy was performed without difficulty. The                            patient tolerated the procedure well. The quality                            of the bowel preparation was adequate. The                            ileocecal valve, appendiceal orifice, and rectum                            were photographed. The entire colon was well                            visualized. Scope In: 7:39:32 AM Scope Out: 7:56:44 AM Scope Withdrawal Time: 0 hours 9 minutes 13 seconds  Total Procedure Duration: 0 hours 17 minutes 12 seconds  Findings:      Hemorrhoids were found on perianal exam.      A 5 mm polyp was found in the recto-sigmoid colon. The polyp was       sessile. The polyp was removed with a cold snare. Resection and       retrieval were complete. Estimated blood loss was minimal.      The exam was otherwise without abnormality on direct and retroflexion       views. Impression:               - Hemorrhoids found  on perianal exam. Grade                            3?"easily reduced.                           - One 5 mm polyp at the recto-sigmoid colon,                            removed with a cold snare. Resected and retrieved.                           - The examination was otherwise normal on direct                            and retroflexion views. Patient likely bleeding                            from hemorrhoids. She would likely benefit from a                            hemorrhoid banding. Moderate Sedation:      Moderate (conscious) sedation was personally administered by an       anesthesia professional. The following parameters were monitored: oxygen       saturation, heart rate, blood  pressure, respiratory rate, EKG, adequacy       of pulmonary ventilation, and response to care. Recommendation:           - Patient has a contact number available for                            emergencies. The signs and symptoms of potential                            delayed complications were discussed with the                            patient. Return to normal activities tomorrow.                            Written discharge instructions were provided to the                            patient.                           - Resume previous diet.                           - Continue present medications.                           - Repeat colonoscopy for surveillance based on                            pathology results. Hemorrhoid banding pamphlet  provided                           - Return to GI office in 6 weeks for possible                            hemorrhoid banding.. Procedure Code(s):        --- Professional ---                           [phone], Colonoscopy, flexible; with removal of                            tumor(s), polyp(s), or other lesion(s) by snare                            technique Diagnosis Code(s):        --- Professional ---                           D12.7, Benign neoplasm of rectosigmoid junction                           K64.9, Unspecified hemorrhoids                           K92.1, Melena (includes Hematochezia) CPT copyright 2022 American [employer] Association. All rights reserved. The codes documented in this report are preliminary and upon coder review may  be revised to meet current compliance requirements. Gerrit Friends. Karly Pitter, MD Gennette Pac, MD 05/22/2022 8:12:39 AM This report has been signed electronically. Number of Addenda: 0

## 2022-05-22 NOTE — Interval H&P Note (Signed)
History and Physical Interval Note:  05/22/2022 7:24 AM  Tanya Velazquez  has presented today for surgery, with the diagnosis of hematochezia.  The various methods of treatment have been discussed with the patient and family. After consideration of risks, benefits and other options for treatment, the patient has consented to  Procedure(s) with comments: COLONOSCOPY WITH PROPOFOL (N/A) - 8:15 am as a surgical intervention.  The patient's history has been reviewed, patient examined, no change in status, stable for surgery.  I have reviewed the patient's chart and labs.  Questions were answered to the patient's satisfaction.     Tanya Velazquez  No change.  Diagnostic colonoscopy plan.  The risks, benefits, limitations, alternatives and imponderables have been reviewed with the patient. Questions have been answered. All parties are agreeable.

## 2022-05-22 NOTE — Anesthesia Preprocedure Evaluation (Signed)
Anesthesia Evaluation  Patient identified by MRN, date of birth, ID band Patient awake    Reviewed: Allergy & Precautions, H&P , NPO status , Patient's Chart, lab work & pertinent test results, reviewed documented beta blocker date and time   Airway Mallampati: II  TM Distance: >3 FB Neck ROM: full    Dental no notable dental hx.    Pulmonary neg pulmonary ROS, COPD, Current Smoker and Patient abstained from smoking.   Pulmonary exam normal breath sounds clear to auscultation       Cardiovascular Exercise Tolerance: Good hypertension, negative cardio ROS  Rhythm:regular Rate:Normal     Neuro/Psych  Headaches PSYCHIATRIC DISORDERS Anxiety Depression     Neuromuscular disease negative neurological ROS  negative psych ROS   GI/Hepatic negative GI ROS, Neg liver ROS, hiatal hernia,GERD  ,,  Endo/Other  negative endocrine ROSdiabetes    Renal/GU Renal diseasenegative Renal ROS  negative genitourinary   Musculoskeletal   Abdominal   Peds  Hematology negative hematology ROS (+) Blood dyscrasia, anemia   Anesthesia Other Findings   Reproductive/Obstetrics negative OB ROS                             Anesthesia Physical Anesthesia Plan  ASA: 3  Anesthesia Plan: General   Post-op Pain Management:    Induction:   PONV Risk Score and Plan: Propofol infusion  Airway Management Planned:   Additional Equipment:   Intra-op Plan:   Post-operative Plan:   Informed Consent: I have reviewed the patients History and Physical, chart, labs and discussed the procedure including the risks, benefits and alternatives for the proposed anesthesia with the patient or authorized representative who has indicated his/her understanding and acceptance.     Dental Advisory Given  Plan Discussed with: CRNA  Anesthesia Plan Comments:        Anesthesia Quick Evaluation

## 2022-05-22 NOTE — Transfer of Care (Signed)
Immediate Anesthesia Transfer of Care Note  Patient: Tanya Velazquez  Procedure(s) Performed: COLONOSCOPY WITH PROPOFOL POLYPECTOMY  Patient Location: Endoscopy Unit  Anesthesia Type:General  Level of Consciousness: awake, alert , and oriented  Airway & Oxygen Therapy: Patient Spontanous Breathing  Post-op Assessment: Report given to RN and Post -op Vital signs reviewed and stable  Post vital signs: Reviewed and stable  Last Vitals:  Vitals Value Taken Time  BP 91/45 05/22/22 0802  Temp 36.4 C 05/22/22 0802  Pulse 67 05/22/22 0802  Resp 20 05/22/22 0802  SpO2 98 % 05/22/22 0802    Last Pain:  Vitals:   05/22/22 0802  TempSrc: Oral  PainSc: 5       Patients Stated Pain Goal: 5 (05/22/22 0647)  Complications: No notable events documented.

## 2022-05-23 LAB — SURGICAL PATHOLOGY

## 2022-05-23 NOTE — Anesthesia Postprocedure Evaluation (Signed)
Anesthesia Post Note  Patient: Tanya Velazquez  Procedure(s) Performed: COLONOSCOPY WITH PROPOFOL POLYPECTOMY  Patient location during evaluation: Phase II Anesthesia Type: General Level of consciousness: awake Pain management: pain level controlled Vital Signs Assessment: post-procedure vital signs reviewed and stable Respiratory status: spontaneous breathing and respiratory function stable Cardiovascular status: blood pressure returned to baseline and stable Postop Assessment: no headache and no apparent nausea or vomiting Anesthetic complications: no Comments: Late entry   No notable events documented.   Last Vitals:  Vitals:   05/22/22 0656 05/22/22 0802  BP: 117/70 (!) 91/45  Pulse: 72 67  Resp: 18 20  Temp: 36.8 C 36.4 C  SpO2: 96% 98%    Last Pain:  Vitals:   05/22/22 0802  TempSrc: Oral  PainSc: 5                  Windell Norfolk

## 2022-05-26 ENCOUNTER — Encounter: Payer: Self-pay | Admitting: Internal Medicine

## 2022-05-29 ENCOUNTER — Encounter (HOSPITAL_COMMUNITY): Payer: Self-pay | Admitting: Internal Medicine

## 2022-05-30 ENCOUNTER — Inpatient Hospital Stay (HOSPITAL_COMMUNITY): Admission: RE | Admit: 2022-05-30 | Payer: 59 | Source: Ambulatory Visit

## 2022-06-06 ENCOUNTER — Encounter (HOSPITAL_COMMUNITY): Payer: Self-pay

## 2022-06-06 ENCOUNTER — Ambulatory Visit (HOSPITAL_COMMUNITY)
Admission: RE | Admit: 2022-06-06 | Discharge: 2022-06-06 | Disposition: A | Discharge status: Home / Self Care | Payer: 59 | Source: Ambulatory Visit | Attending: Nurse Practitioner | Admitting: Nurse Practitioner

## 2022-06-06 DIAGNOSIS — Z1231 Encounter for screening mammogram for malignant neoplasm of breast: Secondary | ICD-10-CM | POA: Insufficient documentation

## 2022-07-08 ENCOUNTER — Encounter: Payer: Self-pay | Admitting: Gastroenterology

## 2022-07-08 ENCOUNTER — Encounter: Payer: 59 | Admitting: Gastroenterology

## 2022-10-08 ENCOUNTER — Other Ambulatory Visit: Payer: Self-pay | Admitting: Gastroenterology

## 2022-10-13 ENCOUNTER — Encounter: Payer: Self-pay | Admitting: Gastroenterology

## 2022-10-13 ENCOUNTER — Ambulatory Visit (INDEPENDENT_AMBULATORY_CARE_PROVIDER_SITE_OTHER): Payer: 59 | Admitting: Gastroenterology

## 2022-10-13 VITALS — BP 119/77 | HR 84 | Temp 97.6°F | Ht 62.0 in | Wt 143.8 lb

## 2022-10-13 DIAGNOSIS — K642 Third degree hemorrhoids: Secondary | ICD-10-CM

## 2022-10-13 DIAGNOSIS — K625 Hemorrhage of anus and rectum: Secondary | ICD-10-CM

## 2022-10-13 MED ORDER — LINACLOTIDE 290 MCG PO CAPS: 290.0000 ug | ORAL_CAPSULE | Freq: Every day | ORAL | 3 refills | Status: DC

## 2022-10-13 NOTE — Patient Instructions (Signed)
Continue to avoid straining.  Limit toilet time to 2-3 minutes at the most.   Avoid constipation. Take 2 tablespoons of natural wheat bran, natural oat bran, flax, Benefiber or any over the counter fiber supplement and increase your water intake to 7-8 glasses daily.  I have refilled your Linzess 290 mcg daily for you today.  Occasionally, you may have more bleeding than usual after the banding procedure. This is often from the untreated hemorrhoids rather than the treated one. Don't be concerned if there is a tablespoon or so of blood. If there is more blood than this, lie flat with your bottom higher than your head and apply an ice pack to the area. If the bleeding does not stop within a half an hour or if you feel faint, have severe pain, chills, fever or difficulty passing urine (very rare) or other problems, you should call us at [phone] or report to the nearest emergency room. Please call me with any concerns!  The procedure you have had should have been relatively painless since the banding of the area involved does not have nerve endings and there is no pain sensation. The rubber band cuts off the blood supply to the hemorrhoid and the band may fall off as soon as 48 hours after the banding (the band may occasionally be seen in the toilet bowl following a bowel movement). You may notice a temporary feeling of fullness in the rectum which should respond adequately to plain Tylenol or Motrin.  I will see you back in 2-3 weeks for additional banding.  Brooke Bonito, MSN, FNP-BC, AGACNP-BC [facility]

## 2022-10-13 NOTE — Progress Notes (Signed)
   [facility] Banding Procedure Note:   Tanya Velazquez is a 62 y.o. female presenting today for consideration of hemorrhoid banding. Last colonoscopy 05/22/22 with hemorrhoids found on perianal exam, grade 3/easily reduced, 5 mm polyp at the rectosigmoid junction.  Bleeding suspected to be secondary to hemorrhoids and recommended hemorrhoid banding.  Latex Allergy: No  Interval History: Has issues with constipation, currently not well-controlled, however she has been off of her Linzess.  Usually does very well with her Linzess.  Has intermittent rectal bleeding and can cover the toilet tissue usually.  The patient presents with symptomatic grade 3 hemorrhoids, unresponsive to maximal medical therapy, requesting rubber band ligation of his/her hemorrhoidal disease. All risks, benefits, and alternative forms of therapy were described and informed consent was obtained.  External perianal exam with evidence of grade 3 internal hemorrhoids as well as some external hemorrhoids and skin tags.  Grade 3 internal hemorrhoids with some mucosal prolapse easily reducible on exam.  No overt rectal bleeding on exam today.  The decision was made to band the right posterior internal hemorrhoid, and the [facility] O'Regan System was used to perform band ligation without complication. Digital anorectal examination was then performed to assure proper positioning of the band, and to adjust the banded tissue as required. The patient was discharged home without pain or other issues. Dietary and behavioral recommendations were given and Linzess refilled (IBS-C), along with follow-up instructions. The patient will return in 2-3 weeks for additional banding as required.  No complications were encountered and the patient tolerated the procedure well.    Brooke Bonito, MSN, FNP-BC, AGACNP-BC [facility]

## 2022-10-28 ENCOUNTER — Ambulatory Visit (INDEPENDENT_AMBULATORY_CARE_PROVIDER_SITE_OTHER): Payer: 59 | Admitting: Gastroenterology

## 2022-10-28 ENCOUNTER — Encounter: Payer: Self-pay | Admitting: Gastroenterology

## 2022-10-28 VITALS — BP 113/55 | HR 77 | Temp 97.6°F | Ht 62.0 in | Wt 144.4 lb

## 2022-10-28 DIAGNOSIS — K642 Third degree hemorrhoids: Secondary | ICD-10-CM

## 2022-10-28 NOTE — Patient Instructions (Addendum)
Continue to avoid straining. Limit toilet time to 2-3 minutes at the most.  Continue Linzess.  Avoid constipation. Take 2 tablespoons of natural wheat bran, natural oat bran, flax, Benefiber or any over the counter fiber supplement and increase your water intake to 7-8 glasses daily.  Occasionally, you may have more bleeding than usual after the banding procedure. This is often from the untreated hemorrhoids rather than the treated one. Don't be concerned if there is a tablespoon or so of blood. If there is more blood than this, lie flat with your bottom higher than your head and apply an ice pack to the area. If the bleeding does not stop within a half an hour or if you feel faint, have severe pain, chills, fever or difficulty passing urine (very rare) or other problems, you should call us at [phone] or report to the nearest emergency room. Please call me with any concerns!  The procedure you have had should have been relatively painless since the banding of the area involved does not have nerve endings and there is no pain sensation. The rubber band cuts off the blood supply to the hemorrhoid and the band may fall off as soon as 48 hours after the banding (the band may occasionally be seen in the toilet bowl following a bowel movement). You may notice a temporary feeling of fullness in the rectum which should respond adequately to plain Tylenol or Motrin.  I will see you back in 2-3 weeks for additional banding.  Brooke Bonito, MSN, FNP-BC, AGACNP-BC [facility]

## 2022-10-28 NOTE — Progress Notes (Signed)
   [facility] Banding Procedure Note:   Tanya Velazquez is a 62 y.o. female presenting today for consideration of hemorrhoid banding. Last colonoscopy 05/22/22 with hemorrhoids found on perianal exam, grade 3/easily reduced, 5 mm polyp at the rectosigmoid junction.  Latex Allergy: NO  Interval History: Constipation improved with Linzess, not having to strain as much.  Still having some discomfort with her hemorrhoids but denies any rectal bleeding.  The patient presents with symptomatic grade 3 hemorrhoids, unresponsive to maximal medical therapy, requesting rubber band ligation of his/her hemorrhoidal disease. All risks, benefits, and alternative forms of therapy were described and informed consent was obtained.  The decision was made to band the right anterior internal hemorrhoid, and the [facility] O'Regan System was used to perform band ligation without complication. Digital anorectal examination was then performed to assure proper positioning of the band, and to adjust the banded tissue as required. The patient was discharged home without pain or other issues. Dietary and behavioral recommendations were given and (if necessary prescriptions were given), along with follow-up instructions. The patient will return in 2-3 weeks for additional banding as required.  No complications were encountered and the patient tolerated the procedure well.  Discussed today that she may benefit from 1-2 additional banding's after banding all 3 columns given size of her hemorrhoids.  Will continue to assess at next visit.   Brooke Bonito, MSN, FNP-BC, AGACNP-BC [facility]

## 2022-11-18 ENCOUNTER — Encounter: Payer: 59 | Admitting: Gastroenterology

## 2022-11-20 ENCOUNTER — Encounter: Payer: Self-pay | Admitting: Gastroenterology

## 2022-12-09 ENCOUNTER — Encounter: Payer: 59 | Admitting: Gastroenterology

## 2022-12-09 ENCOUNTER — Encounter: Payer: Self-pay | Admitting: Gastroenterology

## 2022-12-09 NOTE — Progress Notes (Deleted)
   [facility] Banding Procedure Note:   Tanya Velazquez is a 62 y.o. female presenting today for consideration of hemorrhoid banding. Last colonoscopy 05/22/2022 with hemorrhoids found on perianal exam, grade 3/easily reduced internal hemorrhoids, 5 mm polyp at the rectosigmoid junction.  Latex Allergy: no  Interval History: ***  The patient presents with symptomatic grade *** hemorrhoids, unresponsive to maximal medical therapy, requesting rubber band ligation of his/her hemorrhoidal disease. All risks, benefits, and alternative forms of therapy were described and informed consent was obtained.  The decision was made to band the left lateral internal hemorrhoid, and the [facility] O'Regan System was used to perform band ligation without complication. Digital anorectal examination was then performed to assure proper positioning of the band, and to adjust the banded tissue as required. The patient was discharged home without pain or other issues. Dietary and behavioral recommendations were given and (if necessary prescriptions were given), along with follow-up instructions. The patient will return in *** for followup and possible additional banding as required.  No complications were encountered and the patient tolerated the procedure well.    Brooke Bonito, MSN, FNP-BC, AGACNP-BC [facility]

## 2022-12-22 NOTE — Progress Notes (Unsigned)
   [facility] Banding Procedure Note:   Tanya Velazquez is a 61 y.o. female presenting today for consideration of hemorrhoid banding. Last colonoscopy 05/22/22 with hemorrhoids found on perianal exam, grade 3/easily reduced, 5 mm polyp at the rectosigmoid junction.  Latex Allergy: NO  Interval History: Underwent banding 10/13/2022 and 10/28/2022. Has bene having some   The patient presents with symptomatic grade 3 hemorrhoids, unresponsive to maximal medical therapy, requesting rubber band ligation of his/her hemorrhoidal disease. All risks, benefits, and alternative forms of therapy were described and informed consent was obtained.  On Digital rectal exam today I observed more hemorrhoid tissue prolapse and was unable to reduce at the 12:00 and 5:00 positions. She has combination of grade 3 and grade 4 hemorrhoids.   The decision was made to band the left lateral internal hemorrhoid, and the [facility] O'Regan System was used to perform band ligation without complication. Digital anorectal examination was then performed to assure proper positioning of the band, and to adjust the banded tissue as required. The patient was discharged home without pain or other issues. Dietary and behavioral recommendations were given along with follow-up instructions. The patient will return in 2-3 weeks for possible additional banding as required. May consider anoscopy to further evaluate.   No complications were encountered and the patient tolerated the procedure well.   Advised her to start Linzess 290 mcg daily consisently and reinforced education about washout period of diarrhea and she has agreed to try. Can trial Ibsrella or Amitiza if medication too strong or not effective enough. We discussed that some of her hemorrhoids appear to be external and grade 4 and will not be amenable to banding.   Brooke Bonito, MSN, FNP-BC, AGACNP-BC [facility]

## 2022-12-23 ENCOUNTER — Ambulatory Visit (INDEPENDENT_AMBULATORY_CARE_PROVIDER_SITE_OTHER): Payer: 59 | Admitting: Gastroenterology

## 2022-12-23 ENCOUNTER — Encounter: Payer: Self-pay | Admitting: Gastroenterology

## 2022-12-23 VITALS — BP 121/79 | HR 74 | Temp 97.5°F | Ht 62.0 in | Wt 152.0 lb

## 2022-12-23 DIAGNOSIS — K642 Third degree hemorrhoids: Secondary | ICD-10-CM

## 2022-12-23 DIAGNOSIS — K643 Fourth degree hemorrhoids: Secondary | ICD-10-CM

## 2022-12-23 NOTE — Patient Instructions (Signed)
Continue to avoid straining. Limit toilet time to 2-3 minutes at the most.   Avoid constipation. Take 2 tablespoons of natural wheat bran, natural oat bran, flax, Benefiber or any over the counter fiber supplement and increase your water intake to 7-8 glasses daily.  Please try taking her Linzess 290 mcg once daily on a daily basis.  It is normal for about 2 weeks for you to have more consistent diarrhea but this should subside after 2 weeks.  If you continue to have diarrhea after the 2 weeks then please let me know and we can consider switching to something different.  Occasionally, you may have more bleeding than usual after the banding procedure. This is often from the untreated hemorrhoids rather than the treated one. Don't be concerned if there is a tablespoon or so of blood. If there is more blood than this, lie flat with your bottom higher than your head and apply an ice pack to the area. If the bleeding does not stop within a half an hour or if you feel faint, have severe pain, chills, fever or difficulty passing urine (very rare) or other problems, you should call us at [phone] or report to the nearest emergency room. Please call me with any concerns!  The procedure you have had should have been relatively painless since the banding of the area involved does not have nerve endings and there is no pain sensation. The rubber band cuts off the blood supply to the hemorrhoid and the band may fall off as soon as 48 hours after the banding (the band may occasionally be seen in the toilet bowl following a bowel movement). You may notice a temporary feeling of fullness in the rectum which should respond adequately to plain Tylenol or Motrin.  I will see you back in 2-3 weeks for additional banding.  Brooke Bonito, MSN, FNP-BC, AGACNP-BC [facility]

## 2023-01-13 ENCOUNTER — Encounter: Payer: 59 | Admitting: Gastroenterology

## 2023-01-22 ENCOUNTER — Encounter: Payer: 59 | Admitting: Gastroenterology

## 2023-02-07 ENCOUNTER — Telehealth: Payer: Self-pay | Admitting: Emergency Medicine

## 2023-02-07 ENCOUNTER — Other Ambulatory Visit: Payer: Self-pay

## 2023-02-07 ENCOUNTER — Ambulatory Visit
Admission: EM | Admit: 2023-02-07 | Discharge: 2023-02-07 | Disposition: A | Discharge status: Home / Self Care | Payer: 59 | Attending: Family Medicine | Admitting: Family Medicine

## 2023-02-07 ENCOUNTER — Encounter: Payer: Self-pay | Admitting: Emergency Medicine

## 2023-02-07 DIAGNOSIS — J069 Acute upper respiratory infection, unspecified: Secondary | ICD-10-CM

## 2023-02-07 DIAGNOSIS — J441 Chronic obstructive pulmonary disease with (acute) exacerbation: Secondary | ICD-10-CM | POA: Diagnosis not present

## 2023-02-07 LAB — POC COVID19/FLU A&B COMBO
Covid Antigen, POC: NEGATIVE
Influenza A Antigen, POC: NEGATIVE
Influenza B Antigen, POC: NEGATIVE

## 2023-02-07 MED ORDER — FLUTICASONE-UMECLIDIN-VILANT 100-62.5-25 MCG/ACT IN AEPB: 1.0000 | INHALATION_SPRAY | Freq: Every day | RESPIRATORY_TRACT | 0 refills | Status: DC

## 2023-02-07 MED ORDER — PROMETHAZINE-DM 6.25-15 MG/5ML PO SYRP: 5.0000 mL | ORAL_SOLUTION | Freq: Four times a day (QID) | ORAL | 0 refills | Status: DC | PRN

## 2023-02-07 MED ORDER — FLUTICASONE-UMECLIDIN-VILANT 100-62.5-25 MCG/ACT IN AEPB: 1.0000 | INHALATION_SPRAY | Freq: Every day | RESPIRATORY_TRACT | 0 refills | Status: AC

## 2023-02-07 MED ORDER — ALBUTEROL SULFATE HFA 108 (90 BASE) MCG/ACT IN AERS: 2.0000 | INHALATION_SPRAY | RESPIRATORY_TRACT | 0 refills | Status: DC | PRN

## 2023-02-07 MED ORDER — DEXAMETHASONE SODIUM PHOSPHATE 10 MG/ML IJ SOLN
10.0000 mg | Freq: Once | INTRAMUSCULAR | Status: AC
  Administered 2023-02-07: 10 mg via INTRAMUSCULAR

## 2023-02-07 MED ORDER — ALBUTEROL SULFATE HFA 108 (90 BASE) MCG/ACT IN AERS: 2.0000 | INHALATION_SPRAY | RESPIRATORY_TRACT | 0 refills | Status: AC | PRN

## 2023-02-07 NOTE — ED Triage Notes (Signed)
Pt reports sore throat, runny nose, diarrhea x5 days. Denies any known fevers and reports intermittent abd pain.

## 2023-02-07 NOTE — ED Provider Notes (Signed)
[facility]    CSN: [id number] Arrival date & time: 02/07/23  1151      History   Chief Complaint Chief Complaint  Patient presents with   Nasal Congestion    HPI Tanya Velazquez is a 63 y.o. female.   Patient presenting today with 5-day history of sore throat, runny nose, diarrhea, cough, chest tightness, wheezing.  Denies fever, chills, chest pain, abdominal pain, nausea vomiting or diarrhea.  So far using inhaler regimen for COPD with minimal relief.  Not tried anything over-the-counter for symptoms.  No known sick contacts recently.  Does state that she is running low on both of her inhalers and requesting a refill.    Past Medical History:  Diagnosis Date   Anxiety    B12 deficiency anemia    Chronic constipation    Chronic constipation    Chronic low back pain    lumbar radiculopathy   COPD (chronic obstructive pulmonary disease) (HCC)    Diabetes mellitus    DJD (degenerative joint disease)    GERD (gastroesophageal reflux disease) 04/13/07   EGD Dr Rourk->patulous EG junction, HH, antral erosions   H/O hiatal hernia    Hyperplastic colon polyp 06/27/2010   Hypertension    Migraines    Osteoarthritis    Raspy voice    Smokers' cough (HCC)    SUI (stress urinary incontinence, female)     Patient Active Problem List   Diagnosis Date Noted   Lumbar radiculopathy 03/20/2020   AKI (acute kidney injury) (HCC) 06/11/2019   Syncope and collapse    Hypotension 06/09/2019   Prolapsed internal hemorrhoids, grade 3 05/20/2019   Rectal bleeding 02/04/2019   Rectal pain 02/04/2019   Syncope 10/07/2018   Metatarsal fracture 10/07/2018   Dizziness    Essential hypertension    Chest pain    Gastroesophageal reflux disease    Depression with anxiety    CKD (chronic kidney disease), symptom management only, stage 2 (mild)    Chronic renal failure, stage 3 (moderate)    Acute renal failure (ARF) (HCC) 08/09/2018   RUQ pain 04/10/2014   Abdominal pain  04/10/2014   Abnormal LFTs 04/10/2014   Intrahepatic bile duct dilation 04/10/2014   Common bile duct dilatation 04/10/2014   Constipation 06/11/2011   History of gastroesophageal reflux (GERD) 06/20/2010   Family history of colon cancer 06/20/2010    Past Surgical History:  Procedure Laterality Date   APPENDECTOMY     BLADDER SUSPENSION  10/07/2011   Procedure: SPARC PROCEDURE;  Surgeon: Anner Crete, MD;  Location: [facility];  Service: Urology;  Laterality: N/A;  1 hour requested for this case    CARDIAC CATHETERIZATION  08-09-2004 DR Jacinto Halim   NORMAL CORONARY ARTERIES/  NORMAL LVF   CARDIAC CATHETERIZATION  2006   normal coronary arteries   COLONOSCOPY  3/31/9   Dr Rourk->friable anal canal   COLONOSCOPY  0 06/27/10   Dr. Jena Gauss hyperplastic polyp, prominent external hemorrhoid plexus likely source of hematochezia, long tortuous colon. next tcs 06/2015.   COLONOSCOPY WITH PROPOFOL N/A 04/07/2019   Procedure: COLONOSCOPY WITH PROPOFOL;  Surgeon: Corbin Ade, MD;  [facility];  Service: Endoscopy;  Laterality: N/A;  2:00pm - spoke w/pt, she knows to arrive at 11:30, she could not take the AM slot due to transportation   COLONOSCOPY WITH PROPOFOL N/A 05/22/2022   Procedure: COLONOSCOPY WITH PROPOFOL;  Surgeon: Corbin Ade, MD;  [facility];  Service:  Endoscopy;  Laterality: N/A;  8:15 am   CYSTOSCOPY  10/07/2011   Procedure: CYSTOSCOPY;  Surgeon: Anner Crete, MD;  Location: [facility];  Service: Urology;  Laterality: N/A;   ESOPHAGEAL MANOMETRY  2013   Dr. Alycia Rossetti at [facility] incomplete bolus clearance with some breaks in contractions but there was peristalsis   ESOPHAGOGASTRODUODENOSCOPY  06/27/2010   Schatzki ring s/p dilation, small hiatal hernia, couple tiny antral erosions.   EXCISION VOLAR GANGLION LEFT WRIST  06-27-2005   HEAD-UP TILT TABLE TEST  01-21-2007 &  11-04-2004  DR WEINTRAUB   HX CHRONIC RECURRENT SYNCOPAL.  EPICODES--  NEGATIVE RESULT INCLUDING ISUPREL INFUSION   HEMORRHOID SURGERY  09-13-2003   LAPAROSCOPIC CHOLECYSTECTOMY  02-23-2007   LAPAROTOMY W/ APPENDCTOMY AND LEFT SALPINGO-OOPHECTOMY  AGE 71'S   pH/impedence  2013   Dr. Alycia Rossetti at [facility]. Increased reflux off PPI with excellent correlation between reflux events and regurgitation. Patient advised to hold PPI for study by Dr. Alycia Rossetti.    POLYPECTOMY  04/07/2019   Procedure: POLYPECTOMY;  Surgeon: Corbin Ade, MD;  [facility];  Service: Endoscopy;;   POLYPECTOMY  05/22/2022   Procedure: POLYPECTOMY;  Surgeon: Corbin Ade, MD;  [facility];  Service: Endoscopy;;   RIGHT SHOULDER ARTHROSCOPY/ DISTAL CLAVICLE RESECTION / DEBRIDEMENT ROTATOR CUFF AND LABRUM TEAR/ BURSECTOMY  06-28-2010   IMPINGEMENT SYNDROME/ [facility] JOINT ARTHRITIS/ ROTATOR CUFF TENDINOPATHY   SHOULDER ARTHROSCOPY  08-13-2010   LEFT SHOULDER IMPINGEMENT SYNDROME/ [facility] JOINT   STOMACH SURGERY  2013   antireflux surgery at [facility]   TRANSFORAMINAL LUMBAR INTERBODY FUSION W/ MIS 1 LEVEL N/A 03/20/2020   Procedure: MINIMALLY INVASIVE (MIS) TRANSFORAMINAL LUMBAR INTERBODY FUSION (TLIF) LUMBAR FOUR-LUMBAR FIVE, RIGHT;  Surgeon: Bedelia Person, MD;  Location: [facility];  Service: Neurosurgery;  Laterality: N/A;  MINIMALLY INVASIVE (MIS) TRANSFORAMINAL LUMBAR INTERBODY FUSION (TLIF) LUMBAR FOUR-LUMBAR FIVE, RIGHT   VAGINAL HYSTERECTOMY  age 32's    OB History     Gravida      Para      Term      Preterm      AB      Living  2      SAB      IAB      Ectopic      Multiple      Live Births               Home Medications    Prior to Admission medications   Medication Sig Start Date End Date Taking? Authorizing Provider  promethazine-dextromethorphan (PROMETHAZINE-DM) 6.25-15 MG/5ML syrup Take 5 mLs by mouth 4 (four) times daily as needed. 02/07/23  Yes Particia Nearing, PA-C  albuterol [facility]) [phone] Base) MCG/ACT inhaler  Inhale 2 puffs into the lungs every 4 (four) hours as needed for wheezing or shortness of breath. 02/07/23   Particia Nearing, PA-C  buPROPion [facility] SR) 150 MG 12 hr tablet Take 150 mg by mouth at bedtime.  05/27/19   [provider]  busPIRone (BUSPAR) 15 MG tablet Take 15 mg by mouth 2 (two) times daily.    [provider]  Cholecalciferol (VITAMIN D3) 50 MCG (2000 UT) TABS Take 2,000 Units by mouth in the morning.     [provider]  cyanocobalamin 1000 MCG tablet Take 1 tablet (1,000 mcg total) by mouth 2 (two) times daily.    [provider]  docusate sodium (COLACE) 100 MG capsule Take  1 capsule (100 mg total) by mouth 2 (two) times daily. 03/21/20   Bedelia Person, MD  Eszopiclone 3 MG TABS Take 3 mg by mouth at bedtime. 09/29/22   [provider]  Fluticasone-Umeclidin-Vilant (TRELEGY ELLIPTA) 100-62.5-25 MCG/ACT AEPB Inhale 1 puff into the lungs daily. 02/07/23   Particia Nearing, PA-C  hydrocortisone (ANUSOL-HC) 2.5 % rectal cream Place 1 application rectally 2 (two) times daily. 02/04/19   Letta Median, PA-C  ipratropium-albuterol (DUONEB) 0.5-2.5 (3) MG/3ML SOLN Inhale 3 mLs into the lungs every 6 (six) hours as needed for wheezing or shortness of breath. 03/10/19   [provider]  linaclotide (LINZESS) 290 MCG CAPS capsule Take 1 capsule (290 mcg total) by mouth daily before breakfast. 10/13/22   Aida Raider, NP  mirtazapine (REMERON) 7.5 MG tablet Take 7.5 mg by mouth at bedtime. 03/12/20   [provider]  Oxycodone HCl 10 MG TABS Take 10 mg by mouth 3 (three) times daily as needed. 01/31/21   [provider]  QUEtiapine (SEROQUEL) 50 MG tablet Take 50 mg by mouth at bedtime.    [provider]  rosuvastatin (CRESTOR) 20 MG tablet Take 20 mg by mouth at bedtime. 02/08/21   [provider]  TRULICITY 0.75 MG/0.5ML SOPN Inject into the skin. 02/25/21   [provider]   Venlafaxine HCl 150 MG TB24 Take 225 mg by mouth daily with breakfast.    [provider]    Family History Family History  Problem Relation Age of Onset   Colon cancer Mother 28   Hypertension Mother    Heart disease Mother 49       CABG   Alzheimer's disease Father    Hypertension Father    Healthy Daughter    Heart failure Sister    Heart Problems Sister        Cardiac stent    Social History Social History   Tobacco Use   Smoking status: Some Days    Current packs/day: 1.50    Average packs/day: 1.5 packs/day for 35.0 years (52.5 ttl pk-yrs)    Types: Cigarettes   Smokeless tobacco: Never   Tobacco comments:    03/2020 down to 1/2 PPD  Vaping Use   Vaping status: Never Used  Substance Use Topics   Alcohol use: No    Alcohol/week: 0.0 standard drinks of alcohol   Drug use: No     Allergies   Amicinonide-benzyl alcohol [amcinonide], Amoxicillin, Doxycycline, Paxil [paroxetine hcl], Penicillins, Sulfa antibiotics, and Trimethoprim   Review of Systems Review of Systems Per HPI  Physical Exam Triage Vital Signs ED Triage Vitals  Encounter Vitals Group     BP 02/07/23 1341 138/82     Systolic BP Percentile --      Diastolic BP Percentile --      Pulse Rate 02/07/23 1341 87     Resp 02/07/23 1341 20     Temp 02/07/23 1341 98.8 F (37.1 C)     Temp Source 02/07/23 1341 Oral     SpO2 02/07/23 1341 98 %     Weight --      Height --      Head Circumference --      Peak Flow --      Pain Score 02/07/23 1340 4     Pain Loc --      Pain Education --      Exclude from Growth Chart --    No data found.  Updated Vital Signs BP 138/82 (BP Location: Right Arm)   Pulse 87   Temp 98.8 F (37.1 C) (Oral)   Resp 20   SpO2 98%   Visual Acuity Right Eye Distance:   Left Eye Distance:   Bilateral Distance:    Right Eye Near:   Left Eye Near:    Bilateral Near:     Physical Exam Vitals and nursing note reviewed.  Constitutional:       Appearance: Normal appearance.  HENT:     Head: Atraumatic.     Right Ear: Tympanic membrane and external ear normal.     Left Ear: Tympanic membrane and external ear normal.     Nose: Rhinorrhea present.     Mouth/Throat:     Mouth: Mucous membranes are moist.     Pharynx: Posterior oropharyngeal erythema present.  Eyes:     Extraocular Movements: Extraocular movements intact.     Conjunctiva/sclera: Conjunctivae normal.  Cardiovascular:     Rate and Rhythm: Normal rate and regular rhythm.     Heart sounds: Normal heart sounds.  Pulmonary:     Effort: Pulmonary effort is normal.     Breath sounds: Wheezing present. No rales.     Comments: Decreased breath sounds throughout Musculoskeletal:        General: Normal range of motion.     Cervical back: Normal range of motion and neck supple.  Skin:    General: Skin is warm and dry.  Neurological:     Mental Status: She is alert and oriented to person, place, and time.  Psychiatric:        Mood and Affect: Mood normal.        Thought Content: Thought content normal.      UC Treatments / Results  Labs (all labs ordered are listed, but only abnormal results are displayed) Labs Reviewed  POC COVID19/FLU A&B COMBO    EKG   Radiology No results found.  Procedures Procedures (including critical care time)  Medications Ordered in UC Medications  dexamethasone (DECADRON) injection 10 mg (has no administration in time range)    Initial Impression / Assessment and Plan / UC Course  I have reviewed the triage vital signs and the nursing notes.  Pertinent labs & imaging results that were available during my care of the patient were reviewed by me and considered in my medical decision making (see chart for details).     Rapid flu and COVID-negative, suspect viral respiratory infection causing a COPD exacerbation.  Will treat with IM Decadron which she states she has tolerated well in the past, refill Trelegy and albuterol  and treat with Phenergan DM.  Discussed supportive over-the-counter medications and home care additionally.  Final Clinical Impressions(s) / UC Diagnoses   Final diagnoses:  COPD exacerbation (HCC)  Viral URI with cough   Discharge Instructions   None    ED Prescriptions     Medication Sig Dispense Auth. Provider   promethazine-dextromethorphan (PROMETHAZINE-DM) 6.25-15 MG/5ML syrup Take 5 mLs by mouth 4 (four) times daily as needed. 100 mL Particia Nearing, PA-C   albuterol [facility]) [phone] Base) MCG/ACT inhaler Inhale 2 puffs into the lungs every 4 (four) hours as needed for wheezing or shortness of breath. 18 g Particia Nearing, [place]   Fluticasone-Umeclidin-Vilant (TRELEGY ELLIPTA) 100-62.5-25 MCG/ACT AEPB Inhale 1 puff into the lungs daily. 1 each Particia Nearing, PA-C      PDMP not reviewed this encounter.   Roosvelt Maser  [place], PA-C 02/07/23 1413

## 2023-02-12 ENCOUNTER — Encounter: Payer: Self-pay | Admitting: Gastroenterology

## 2023-02-12 ENCOUNTER — Encounter: Payer: 59 | Admitting: Gastroenterology

## 2023-03-05 ENCOUNTER — Encounter: Payer: 59 | Admitting: Gastroenterology

## 2023-04-23 ENCOUNTER — Ambulatory Visit: Payer: 59 | Admitting: Gastroenterology

## 2023-04-23 ENCOUNTER — Encounter: Payer: Self-pay | Admitting: Gastroenterology

## 2023-04-23 VITALS — BP 132/78 | HR 70 | Temp 98.0°F | Ht 62.0 in | Wt 146.6 lb

## 2023-04-23 DIAGNOSIS — K642 Third degree hemorrhoids: Secondary | ICD-10-CM

## 2023-04-23 MED ORDER — LUBIPROSTONE 24 MCG PO CAPS: 24.0000 ug | ORAL_CAPSULE | Freq: Two times a day (BID) | ORAL | 3 refills | Status: DC

## 2023-04-23 NOTE — Patient Instructions (Signed)
  Please avoid straining.  You should limit your toilet time to 2-3 minutes at the most.   I recommend Benefiber 2 teaspoons each morning in the beverage of your choice!  Please call me with any concerns or issues!  I have sent Amitiza to the pharmacy. Once you get this, stop the Linzess. Take Amitiza twice a day WITH FOOD to avoid nausea. Please let us know if not helpful!  I will see you in follow-up in about 6 weeks!  I enjoyed seeing you again today! I value our relationship and want to provide genuine, compassionate, and quality care. You may receive a survey regarding your visit with me, and I welcome your feedback! Thanks so much for taking the time to complete this. I look forward to seeing you again.      Gelene Mink, PhD, ANP-BC [facility]

## 2023-04-23 NOTE — Progress Notes (Signed)
      [facility] BANDING PROCEDURE NOTE  Tanya Velazquez is a 63 y.o. female presenting today for consideration of hemorrhoid banding. Last colonoscopy 05/22/22 with hemorrhoids found on perianal exam, grade 3/easily reduced, 5 mm polyp at the rectosigmoid junction. She has had left lateral, right anterior, right posterior banding. Constipation remains an issue despite Linzess 290 mcg daily. Improved bleeding though still present. Still with prolapsing. She has had banding several years ago as well.    The patient presents with symptomatic grade 3 hemorrhoids, unresponsive to maximal medical therapy, requesting rubber band ligation of her hemorrhoidal disease. All risks, benefits, and alternative forms of therapy were described and informed consent was obtained.  In the left lateral decubitus position, anoscopic examination revealed grade 3 hemorrhoids in the right anterior and left lateral position (s). She had redundant columns.   The decision was made to band the right anterior and the left lateral internal hemorrhoid, and the [facility] System was used to perform band ligation without complication. Digital anorectal examination was then performed to assure proper positioning of the band, and to adjust the banded tissue as required. The patient was discharged home without pain or other issues. Dietary and behavioral recommendations were given, along with follow-up instructions. The patient will return in about 6 weeks for followup and possible additional banding as required. Can do neutral banding at that time. May ultimately need surgical consult; however, I hope with changing to Amitiza 24 mcg BID that we can reduce toilet time and straining.   No complications were encountered and the patient tolerated the procedure well.   Gelene Mink, PhD, ANP-BC [facility]

## 2023-04-29 ENCOUNTER — Telehealth: Payer: Self-pay

## 2023-04-29 ENCOUNTER — Other Ambulatory Visit: Payer: Self-pay | Admitting: Acute Care

## 2023-04-29 DIAGNOSIS — Z87891 Personal history of nicotine dependence: Secondary | ICD-10-CM

## 2023-04-29 DIAGNOSIS — Z122 Encounter for screening for malignant neoplasm of respiratory organs: Secondary | ICD-10-CM

## 2023-04-29 MED ORDER — HYDROCORTISONE (PERIANAL) 2.5 % EX CREA: 1.0000 | TOPICAL_CREAM | Freq: Two times a day (BID) | CUTANEOUS | 1 refills | Status: AC

## 2023-04-29 NOTE — Telephone Encounter (Signed)
 This could be from prior banding or existing hemorrhoids. I have sent in Anusol cream to use twice a day. Let's monitor for now, as should stop on own. Make sure she is not taking any NSAIDs, aspirin powders, etc.   Monitor for any significant bleeding. Call us  tomorrow with an update. Avoid straining, keep stool soft.

## 2023-04-29 NOTE — Addendum Note (Signed)
 Addended by: Delman Ferns on: 04/29/2023 03:26 PM   Modules accepted: Orders

## 2023-04-29 NOTE — Telephone Encounter (Signed)
 Pt called stating that she has been having some rectal cramping and some bleeding the past couple of days. Please advise.

## 2023-04-29 NOTE — Telephone Encounter (Signed)
 Lmom for pt to return call

## 2023-04-30 NOTE — Telephone Encounter (Signed)
 Note has been done and is up front for pickup.

## 2023-04-30 NOTE — Telephone Encounter (Signed)
 Pt was made aware and verbalized understanding, stated that she hasn't started the Amitiza yet but that she would pick it up along with the cream today. Pt will call back on Monday with an update.

## 2023-04-30 NOTE — Telephone Encounter (Signed)
 Bleeding started a few days after having the banding. Was straining a bit. Last bleeding yesterday, small amount.   She is picking up Amitiza and anusol cream today.  Does not sound like post-banding bleed. She has significant hemorrhoids and could use additional banding in future.  We discussed signs/symptoms that would prompt ED evaluation.  Tammy: can you do a work note for her? Off work tomorrow through Tuesday, return on Wednesday no restrictions. She will pick up Monday.

## 2023-05-11 ENCOUNTER — Ambulatory Visit (HOSPITAL_COMMUNITY): Attending: Nurse Practitioner

## 2023-05-19 ENCOUNTER — Telehealth: Payer: Self-pay

## 2023-05-19 NOTE — Telephone Encounter (Signed)
 Spoke with the pt and advised her to come and pick up her note that was left up front for her. Pt advised we close early on Friday. Pt expressed understanding stating she will pick up Thursday

## 2023-05-19 NOTE — Telephone Encounter (Signed)
 Pt called a lmom for nurse to return call. [phone]

## 2023-06-02 ENCOUNTER — Other Ambulatory Visit: Payer: Self-pay | Admitting: Family Medicine

## 2023-06-04 ENCOUNTER — Ambulatory Visit: Admitting: Gastroenterology

## 2023-06-05 ENCOUNTER — Ambulatory Visit
Admission: EM | Admit: 2023-06-05 | Discharge: 2023-06-05 | Disposition: A | Discharge status: Home / Self Care | Attending: Physician Assistant | Admitting: Physician Assistant

## 2023-06-05 DIAGNOSIS — R21 Rash and other nonspecific skin eruption: Secondary | ICD-10-CM | POA: Diagnosis not present

## 2023-06-05 DIAGNOSIS — L03039 Cellulitis of unspecified toe: Secondary | ICD-10-CM

## 2023-06-05 DIAGNOSIS — S99929A Unspecified injury of unspecified foot, initial encounter: Secondary | ICD-10-CM

## 2023-06-05 DIAGNOSIS — L255 Unspecified contact dermatitis due to plants, except food: Secondary | ICD-10-CM | POA: Diagnosis not present

## 2023-06-05 MED ORDER — PREDNISONE 10 MG PO TABS: ORAL_TABLET | ORAL | 0 refills | Status: DC

## 2023-06-05 MED ORDER — MUPIROCIN 2 % EX OINT: 1.0000 | TOPICAL_OINTMENT | Freq: Two times a day (BID) | CUTANEOUS | 0 refills | Status: AC

## 2023-06-05 MED ORDER — CLINDAMYCIN HCL 300 MG PO CAPS
300.0000 mg | ORAL_CAPSULE | Freq: Three times a day (TID) | ORAL | 0 refills | Status: DC
Start: 2023-06-05 — End: 2023-06-12

## 2023-06-05 NOTE — ED Triage Notes (Signed)
 Pt c/o rash x4days  Pt states that the rash is along her arms, legs, and groin  Pt believes that the rash is from poison ivy  Pt has been using OTC bacitracin  cream for the rash but it has not helped.

## 2023-06-05 NOTE — Discharge Instructions (Signed)
-  Begin using prednisone  for poison ivy and clean with Tecnu (over the counter) -You declined an xray of your toe today -I sent an antibiotic for infection. Clean with soap and water  and apply ointment. Return if spreading redness or pustular drainage. Toenail will likely fall off

## 2023-06-05 NOTE — ED Provider Notes (Signed)
 [facility]    CSN: [id number] Arrival date & time: 06/05/23  0825      History   Chief Complaint Chief Complaint  Patient presents with   Rash    HPI Tanya Velazquez is a 63 y.o. female presenting for 2 separate complaints.  First patient reports 4-day history of itchy, erythematous maculopapular rash of arms, legs and groin.  She says symptoms started the day after she was helping her daughter clean her yard and thinks she came into contact with poison ivy.  Has been applying topical poison ivy cream over-the-counter and taking Benadryl  without relief.  Patient also reports injury of the right great toe only occurred a few days ago when she dropped a metal pole on her toe.  She states it has been continuing to bruise and bleed.  She has been cleaning.  Is able to walk and bear weight.  Denies any concerns for fracture.  HPI  Past Medical History:  Diagnosis Date   Anxiety    B12 deficiency anemia    Chronic constipation    Chronic constipation    Chronic low back pain    lumbar radiculopathy   COPD (chronic obstructive pulmonary disease) (HCC)    Diabetes mellitus    DJD (degenerative joint disease)    GERD (gastroesophageal reflux disease) 04/13/07   EGD Dr Rourk->patulous EG junction, HH, antral erosions   H/O hiatal hernia    Hyperplastic colon polyp 06/27/2010   Hypertension    Migraines    Osteoarthritis    Raspy voice    Smokers' cough (HCC)    SUI (stress urinary incontinence, female)     Patient Active Problem List   Diagnosis Date Noted   Lumbar radiculopathy 03/20/2020   AKI (acute kidney injury) (HCC) 06/11/2019   Syncope and collapse    Hypotension 06/09/2019   Prolapsed internal hemorrhoids, grade 3 05/20/2019   Rectal bleeding 02/04/2019   Rectal pain 02/04/2019   Syncope 10/07/2018   Metatarsal fracture 10/07/2018   Dizziness    Essential hypertension    Chest pain    Gastroesophageal reflux disease    Depression with anxiety     CKD (chronic kidney disease), symptom management only, stage 2 (mild)    Chronic renal failure, stage 3 (moderate)    Acute renal failure (ARF) (HCC) 08/09/2018   RUQ pain 04/10/2014   Abdominal pain 04/10/2014   Abnormal LFTs 04/10/2014   Intrahepatic bile duct dilation 04/10/2014   Common bile duct dilatation 04/10/2014   Constipation 06/11/2011   History of gastroesophageal reflux (GERD) 06/20/2010   Family history of colon cancer 06/20/2010    Past Surgical History:  Procedure Laterality Date   APPENDECTOMY     BLADDER SUSPENSION  10/07/2011   Procedure: SPARC PROCEDURE;  Surgeon: Willye Harvey, MD;  [facility];  Service: Urology;  Laterality: N/A;  1 hour requested for this case    CARDIAC CATHETERIZATION  08-09-2004 DR Berry [place]   NORMAL CORONARY ARTERIES/  NORMAL LVF   CARDIAC CATHETERIZATION  2006   normal coronary arteries   COLONOSCOPY  3/31/9   Dr Rourk->friable anal canal   COLONOSCOPY  0 06/27/10   Dr. Riley Cheadle hyperplastic polyp, prominent external hemorrhoid plexus likely source of hematochezia, long tortuous colon. next tcs 06/2015.   COLONOSCOPY WITH PROPOFOL  N/A 04/07/2019   Procedure: COLONOSCOPY WITH PROPOFOL ;  Surgeon: Suzette Espy, MD;  [facility];  Service: Endoscopy;  Laterality: N/A;  2:00pm - spoke  w/pt, she knows to arrive at 11:30, she could not take the AM slot due to transportation   COLONOSCOPY WITH PROPOFOL  N/A 05/22/2022   Procedure: COLONOSCOPY WITH PROPOFOL ;  Surgeon: Suzette Espy, MD;  [facility]: [facility];  Service: Endoscopy;  Laterality: N/A;  8:15 am   CYSTOSCOPY  10/07/2011   Procedure: CYSTOSCOPY;  Surgeon: Willye Harvey, MD;  Location: [facility];  Service: Urology;  Laterality: N/A;   ESOPHAGEAL MANOMETRY  2013   Dr. Regena Cap at [facility]. incomplete bolus clearance with some breaks in contractions but there was peristalsis   ESOPHAGOGASTRODUODENOSCOPY  06/27/2010   Schatzki ring s/p  dilation, small hiatal hernia, couple tiny antral erosions.   EXCISION VOLAR GANGLION LEFT WRIST  06-27-2005   HEAD-UP TILT TABLE TEST  01-21-2007 &  11-04-2004  DR WEINTRAUB   HX CHRONIC RECURRENT SYNCOPAL. EPICODES--  NEGATIVE RESULT INCLUDING ISUPREL INFUSION   HEMORRHOID SURGERY  09-13-2003   LAPAROSCOPIC CHOLECYSTECTOMY  02-23-2007   LAPAROTOMY W/ APPENDCTOMY AND LEFT SALPINGO-OOPHECTOMY  AGE 6'S   pH/impedence  2013   Dr. Regena Cap at [facility]. Increased reflux off PPI with excellent correlation between reflux events and regurgitation. Patient advised to hold PPI for study by Dr. Regena Cap.    POLYPECTOMY  04/07/2019   Procedure: POLYPECTOMY;  Surgeon: Suzette Espy, MD;  [facility];  Service: Endoscopy;;   POLYPECTOMY  05/22/2022   Procedure: POLYPECTOMY;  Surgeon: Suzette Espy, MD;  [facility];  Service: Endoscopy;;   RIGHT SHOULDER ARTHROSCOPY/ DISTAL CLAVICLE RESECTION / DEBRIDEMENT ROTATOR CUFF AND LABRUM TEAR/ BURSECTOMY  06-28-2010   IMPINGEMENT SYNDROME/ [facility] JOINT ARTHRITIS/ ROTATOR CUFF TENDINOPATHY   SHOULDER ARTHROSCOPY  08-13-2010   LEFT SHOULDER IMPINGEMENT SYNDROME/ [facility] JOINT   STOMACH SURGERY  2013   antireflux surgery at [facility]   TRANSFORAMINAL LUMBAR INTERBODY FUSION W/ MIS 1 LEVEL N/A 03/20/2020   Procedure: MINIMALLY INVASIVE (MIS) TRANSFORAMINAL LUMBAR INTERBODY FUSION (TLIF) LUMBAR FOUR-LUMBAR FIVE, RIGHT;  Surgeon: Van Gelinas, MD;  Location: [facility];  Service: Neurosurgery;  Laterality: N/A;  MINIMALLY INVASIVE (MIS) TRANSFORAMINAL LUMBAR INTERBODY FUSION (TLIF) LUMBAR FOUR-LUMBAR FIVE, RIGHT   VAGINAL HYSTERECTOMY  age 15's    OB History     Gravida      Para      Term      Preterm      AB      Living  2      SAB      IAB      Ectopic      Multiple      Live Births               Home Medications    Prior to Admission medications   Medication Sig Start Date End Date Taking? Authorizing Provider   albuterol  (PROAIR  HFA) 108 (90 Base) MCG/ACT inhaler Inhale 2 puffs into the lungs every 4 (four) hours as needed for wheezing or shortness of breath. 02/07/23  Yes Corbin Dess, PA-C  buprenorphine (BUTRANS) 5 MCG/HR PTWK Place 1 patch onto the skin once a week. 03/23/23  Yes [provider]  Cholecalciferol  (VITAMIN D3) 50 MCG (2000 UT) TABS Take 2,000 Units by mouth in the morning.    Yes [provider]  clindamycin (CLEOCIN) 300 MG capsule Take 1 capsule (300 mg total) by mouth 3 (three) times daily for 7 days. 06/05/23 06/12/23 Yes Floydene Hy, PA-C  cyanocobalamin  1000 MCG tablet Take 1  tablet (1,000 mcg total) by mouth 2 (two) times daily.   Yes [provider]  docusate sodium  (COLACE) 100 MG capsule Take 1 capsule (100 mg total) by mouth 2 (two) times daily. 03/21/20  Yes Van Gelinas, MD  Eszopiclone 3 MG TABS Take 3 mg by mouth at bedtime. 09/29/22  Yes [provider]  Fluticasone -Umeclidin-Vilant (TRELEGY ELLIPTA) 100-62.5-25 MCG/ACT AEPB Inhale 1 puff into the lungs daily. 02/07/23  Yes Corbin Dess, PA-C  gabapentin  (NEURONTIN ) 300 MG capsule Take 300 mg by mouth daily. 04/22/23  Yes [provider]  hydrocortisone  (ANUSOL -HC) 2.5 % rectal cream Place 1 Application rectally 2 (two) times daily. 04/29/23  Yes Delman Ferns, NP  lubiprostone  (AMITIZA ) 24 MCG capsule Take 1 capsule (24 mcg total) by mouth 2 (two) times daily with a meal. 04/23/23  Yes Delman Ferns, NP  metFORMIN (GLUCOPHAGE) 850 MG tablet Take 850 mg by mouth daily with breakfast. 04/22/23  Yes [provider]  mupirocin ointment (BACTROBAN) 2 % Apply 1 Application topically 2 (two) times daily. 06/05/23  Yes Nancy Axon B, PA-C  oxyCODONE -acetaminophen  (PERCOCET) 10-325 MG tablet Take 1 tablet by mouth 4 (four) times daily as needed. 04/22/23  Yes [provider]  predniSONE  (DELTASONE ) 10 MG tablet Take 6 tabs p.o. on day 1 and decrease by 1  tablet daily until complete 06/05/23  Yes Nancy Axon B, PA-C  QUEtiapine  (SEROQUEL ) 50 MG tablet Take 50 mg by mouth at bedtime.   Yes [provider]  rosuvastatin (CRESTOR) 20 MG tablet Take 20 mg by mouth at bedtime. 02/08/21  Yes [provider]  Venlafaxine  HCl 225 MG TB24 Take 1 tablet by mouth daily. 04/22/23  Yes [provider]    Family History Family History  Problem Relation Age of Onset   Colon cancer Mother 77   Hypertension Mother    Heart disease Mother 45       CABG   Alzheimer's disease Father    Hypertension Father    Healthy Daughter    Heart failure Sister    Heart Problems Sister        Cardiac stent    Social History Social History   Tobacco Use   Smoking status: Some Days    Current packs/day: 1.50    Average packs/day: 1.5 packs/day for 35.0 years (52.5 ttl pk-yrs)    Types: Cigarettes   Smokeless tobacco: Never   Tobacco comments:    03/2020 down to 1/2 PPD  Vaping Use   Vaping status: Never Used  Substance Use Topics   Alcohol use: No    Alcohol/week: 0.0 standard drinks of alcohol   Drug use: No     Allergies   Amicinonide-benzyl alcohol [amcinonide], Amoxicillin, Doxycycline, Paxil [paroxetine hcl], Penicillins, Sulfa antibiotics, and Trimethoprim   Review of Systems Review of Systems  Constitutional:  Negative for fatigue and fever.  Musculoskeletal:  Positive for arthralgias and joint swelling. Negative for gait problem.  Skin:  Positive for color change, rash and wound.  Neurological:  Negative for weakness and numbness.     Physical Exam Triage Vital Signs ED Triage Vitals  Encounter Vitals Group     BP 06/05/23 0836 139/77     Systolic BP Percentile --      Diastolic BP Percentile --      Pulse Rate 06/05/23 0836 66     Resp --      Temp 06/05/23 0836 98.3 F (36.8 C)  Temp Source 06/05/23 0836 Oral     SpO2 06/05/23 0836 99 %     Weight 06/05/23 0837 140 lb (63.5 kg)     Height 06/05/23  0837 5\' 2"  (1.575 m)     Head Circumference --      Peak Flow --      Pain Score 06/05/23 0836 8     Pain Loc --      Pain Education --      Exclude from Growth Chart --    No data found.  Updated Vital Signs BP 139/77 (BP Location: Left Arm)   Pulse 66   Temp 98.3 F (36.8 C) (Oral)   Ht 5\' 2"  (1.575 m)   Wt 140 lb (63.5 kg)   SpO2 99%   BMI 25.61 kg/m       Physical Exam Vitals and nursing note reviewed.  Constitutional:      General: She is not in acute distress.    Appearance: Normal appearance. She is not ill-appearing or toxic-appearing.  HENT:     Head: Normocephalic and atraumatic.  Eyes:     General: No scleral icterus.       Right eye: No discharge.        Left eye: No discharge.     Conjunctiva/sclera: Conjunctivae normal.  Cardiovascular:     Rate and Rhythm: Normal rate and regular rhythm.     Heart sounds: Normal heart sounds.  Pulmonary:     Effort: Pulmonary effort is normal. No respiratory distress.     Breath sounds: Normal breath sounds.  Musculoskeletal:     Cervical back: Neck supple.     Comments: RIGHT GREAT TOE: Fungal toenail with bleeding coming from under nail, erythema distal toe and tenderness of toenail and distal toe  Skin:    General: Skin is dry.     Findings: Rash (erythematous maculopapular and vesicular rash of bilateral forearms and legs. Deferred GU exam) present.  Neurological:     General: No focal deficit present.     Mental Status: She is alert. Mental status is at baseline.     Motor: No weakness.     Gait: Gait normal.  Psychiatric:        Mood and Affect: Mood normal.        Behavior: Behavior normal.         UC Treatments / Results  Labs (all labs ordered are listed, but only abnormal results are displayed) Labs Reviewed - No data to display  EKG   Radiology No results found.  Procedures Procedures (including critical care time)  Medications Ordered in UC Medications - No data to  display  Initial Impression / Assessment and Plan / UC Course  I have reviewed the triage vital signs and the nursing notes.  Pertinent labs & imaging results that were available during my care of the patient were reviewed by me and considered in my medical decision making (see chart for details).   63 year old female presents for 2 separate complaints.  Patient reports erythematous and pruritic rash on extremities for the past few days which she thinks is related to poison ivy. Also reports injury of great toe.   Patient declined x-ray of the great toe.  Cannot rule out underlying fracture.  Discussed this with her.  Concern for early cellulitis so we will treat with clindamycin and topical mupirocin given other allergies.  Discussed wound care guidelines and reviewed return precautions.  Rash consistent with poison ivy.  Sent prednisone  taper and encouraged her to use Tecnu. Supportive care and return as needed.  Final Clinical Impressions(s) / UC Diagnoses   Final diagnoses:  Rash and nonspecific skin eruption  Rhus dermatitis  Cellulitis of great toe, unspecified laterality  Injury of toe, unspecified laterality, initial encounter     Discharge Instructions      -Begin using prednisone  for poison ivy and clean with Tecnu (over the counter) -You declined an xray of your toe today -I sent an antibiotic for infection. Clean with soap and water  and apply ointment. Return if spreading redness or pustular drainage. Toenail will likely fall off   ED Prescriptions     Medication Sig Dispense Auth. Provider   clindamycin (CLEOCIN) 300 MG capsule Take 1 capsule (300 mg total) by mouth 3 (three) times daily for 7 days. 21 capsule Nancy Axon B, PA-C   predniSONE  (DELTASONE ) 10 MG tablet Take 6 tabs p.o. on day 1 and decrease by 1 tablet daily until complete 21 tablet Nancy Axon B, PA-C   mupirocin ointment (BACTROBAN) 2 % Apply 1 Application topically 2 (two) times daily. 22 g  Floydene Hy, PA-C      PDMP not reviewed this encounter.   Floydene Hy, PA-C 06/05/23 [phone]

## 2023-06-11 ENCOUNTER — Encounter: Payer: Self-pay | Admitting: Gastroenterology

## 2023-06-11 ENCOUNTER — Ambulatory Visit (INDEPENDENT_AMBULATORY_CARE_PROVIDER_SITE_OTHER): Admitting: Gastroenterology

## 2023-06-11 VITALS — BP 151/76 | HR 69 | Temp 98.6°F | Ht 62.0 in | Wt 150.4 lb

## 2023-06-11 DIAGNOSIS — K642 Third degree hemorrhoids: Secondary | ICD-10-CM

## 2023-06-11 MED ORDER — LUBIPROSTONE 24 MCG PO CAPS: 24.0000 ug | ORAL_CAPSULE | Freq: Two times a day (BID) | ORAL | 3 refills | Status: AC

## 2023-06-11 NOTE — Patient Instructions (Signed)
 Continue Amitiza  twice a day with food. Continue to avoid straining, limit toilet time to 2-3 minutes. Please call if any further concerns with bleeding or hemorrhoids!  We will see you back in 6 months!  I enjoyed seeing you again today! I value our relationship and want to provide genuine, compassionate, and quality care. You may receive a survey regarding your visit with me, and I welcome your feedback! Thanks so much for taking the time to complete this. I look forward to seeing you again.      Delman Ferns, PhD, ANP-BC [facility]

## 2023-06-11 NOTE — Progress Notes (Signed)
     [facility] BANDING PROCEDURE NOTE  Tanya Velazquez is a 63 y.o. female presenting today for consideration of hemorrhoid banding. Last colonoscopy 05/22/22 with hemorrhoids found on perianal exam, grade 3/easily reduced, 5 mm polyp at the rectosigmoid junction. She has had left lateral, right anterior, right posterior banding last year. Prior hx of banding as well. In April 2025 we banded right anterior and left lateral. BM every 2 days now, compared to once a week. No straining. Soft stool. Feels she empties well with this. Improved with decreased bleeding and pressure. Feeling well today.    The patient presents with symptomatic grade 3 hemorrhoids, unresponsive to maximal medical therapy, requesting rubber band ligation of her hemorrhoidal disease. All risks, benefits, and alternative forms of therapy were described and informed consent was obtained.   The decision was made to band the neutrally ,  and the [facility] System was used to perform band ligation without complication. Digital anorectal examination was then performed to assure proper positioning of the band, and to adjust the banded tissue as required. The patient was discharged home without pain or other issues. Dietary and behavioral recommendations were given, along with follow-up instructions. She has had significant improvement in prolapsing tissue compared to prior appointment. Remains with large external hemorrhoid skin tags, but I am pleased with how she has responded to this round of banding. The patient will return in 6 months for routine follow-up. Refills for Amitiza  were provided.   No complications were encountered and the patient tolerated the procedure well.   Delman Ferns, PhD, ANP-BC [facility]

## 2023-08-31 ENCOUNTER — Ambulatory Visit
Admission: EM | Admit: 2023-08-31 | Discharge: 2023-08-31 | Disposition: A | Discharge status: Home / Self Care | Attending: Family Medicine | Admitting: Family Medicine

## 2023-08-31 DIAGNOSIS — L237 Allergic contact dermatitis due to plants, except food: Secondary | ICD-10-CM | POA: Diagnosis not present

## 2023-08-31 MED ORDER — METHYLPREDNISOLONE ACETATE 40 MG/ML IJ SUSP
40.0000 mg | Freq: Once | INTRAMUSCULAR | Status: AC
  Administered 2023-08-31: 40 mg via INTRAMUSCULAR

## 2023-08-31 MED ORDER — TRIAMCINOLONE ACETONIDE 0.1 % EX CREA
1.0000 | TOPICAL_CREAM | Freq: Two times a day (BID) | CUTANEOUS | 0 refills | Status: AC
Start: 2023-08-31 — End: ?

## 2023-08-31 NOTE — ED Triage Notes (Addendum)
 Pt reports rash that is located on both  legs and feet, arms ongoing since last Thursday and has seeming gotten worserthe patient hs been using poison ivy soap and epsom salt and vinegar water  along with taking benadryl . Home treatments have not helped relieve itching. Pt states she was given Cipro  by her primary on 08/25/2023.

## 2023-08-31 NOTE — ED Provider Notes (Signed)
 [facility]    CSN: [id number] Arrival date & time: 08/31/23  1151      History   Chief Complaint No chief complaint on file.   HPI Tanya Velazquez is a 63 y.o. female.   Patient presenting today with 6-day history of itchy and worsening rash after being exposed to poison ivy.  States the rashes to all 4 extremities.  Denies chest tightness, shortness of breath, throat itching or swelling, abdominal pain, nausea vomiting or diarrhea.  Was seen just after onset and started on a short prednisone  taper which she states she is about 5 doses then and has had no benefit.  Also using topical poison ivy ointments with no relief.    Past Medical History:  Diagnosis Date   Anxiety    B12 deficiency anemia    Chronic constipation    Chronic constipation    Chronic low back pain    lumbar radiculopathy   COPD (chronic obstructive pulmonary disease) (HCC)    Diabetes mellitus    DJD (degenerative joint disease)    GERD (gastroesophageal reflux disease) 04/13/07   EGD Dr Rourk->patulous EG junction, HH, antral erosions   H/O hiatal hernia    Hyperplastic colon polyp 06/27/2010   Hypertension    Migraines    Osteoarthritis    Raspy voice    Smokers' cough (HCC)    SUI (stress urinary incontinence, female)     Patient Active Problem List   Diagnosis Date Noted   Lumbar radiculopathy 03/20/2020   AKI (acute kidney injury) (HCC) 06/11/2019   Syncope and collapse    Hypotension 06/09/2019   Prolapsed internal hemorrhoids, grade 3 05/20/2019   Rectal bleeding 02/04/2019   Rectal pain 02/04/2019   Syncope 10/07/2018   Metatarsal fracture 10/07/2018   Dizziness    Essential hypertension    Chest pain    Gastroesophageal reflux disease    Depression with anxiety    CKD (chronic kidney disease), symptom management only, stage 2 (mild)    Chronic renal failure, stage 3 (moderate)    Acute renal failure (ARF) (HCC) 08/09/2018   RUQ pain 04/10/2014   Abdominal pain  04/10/2014   Abnormal LFTs 04/10/2014   Intrahepatic bile duct dilation 04/10/2014   Common bile duct dilatation 04/10/2014   Constipation 06/11/2011   History of gastroesophageal reflux (GERD) 06/20/2010   Family history of colon cancer 06/20/2010    Past Surgical History:  Procedure Laterality Date   APPENDECTOMY     BLADDER SUSPENSION  10/07/2011   Procedure: SPARC PROCEDURE;  Surgeon: Norleen JINNY Seltzer, MD;  [facility];  Service: Urology;  Laterality: N/A;  1 hour requested for this case    CARDIAC CATHETERIZATION  08-09-2004 DR LADONA   NORMAL CORONARY ARTERIES/  NORMAL LVF   CARDIAC CATHETERIZATION  2006   normal coronary arteries   COLONOSCOPY  3/31/9   Dr Rourk->friable anal canal   COLONOSCOPY  0 06/27/10   Dr. Larayne hyperplastic polyp, prominent external hemorrhoid plexus likely source of hematochezia, long tortuous colon. next tcs 06/2015.   COLONOSCOPY WITH PROPOFOL  N/A 04/07/2019   Procedure: COLONOSCOPY WITH PROPOFOL ;  Surgeon: Shaaron Lamar HERO, MD;  Location: [facility];  Service: Endoscopy;  Laterality: N/A;  2:00pm - spoke w/pt, she knows to arrive at 11:30, she could not take the AM slot due to transportation   COLONOSCOPY WITH PROPOFOL  N/A 05/22/2022   Procedure: COLONOSCOPY WITH PROPOFOL ;  Surgeon: Shaaron Lamar HERO, MD;  [facility]: [facility];  Service: Endoscopy;  Laterality: N/A;  8:15 am   CYSTOSCOPY  10/07/2011   Procedure: CYSTOSCOPY;  Surgeon: Norleen JINNY Seltzer, MD;  Location: [facility];  Service: Urology;  Laterality: N/A;   ESOPHAGEAL MANOMETRY  2013   Dr. Gregory at [facility]. incomplete bolus clearance with some breaks in contractions but there was peristalsis   ESOPHAGOGASTRODUODENOSCOPY  06/27/2010   Schatzki ring s/p dilation, small hiatal hernia, couple tiny antral erosions.   EXCISION VOLAR GANGLION LEFT WRIST  06-27-2005   HEAD-UP TILT TABLE TEST  01-21-2007 &  11-04-2004  DR WEINTRAUB   HX CHRONIC RECURRENT SYNCOPAL.  EPICODES--  NEGATIVE RESULT INCLUDING ISUPREL INFUSION   HEMORRHOID SURGERY  09-13-2003   LAPAROSCOPIC CHOLECYSTECTOMY  02-23-2007   LAPAROTOMY W/ APPENDCTOMY AND LEFT SALPINGO-OOPHECTOMY  AGE 56'S   pH/impedence  2013   Dr. Gregory at [facility]. Increased reflux off PPI with excellent correlation between reflux events and regurgitation. Patient advised to hold PPI for study by Dr. Gregory.    POLYPECTOMY  04/07/2019   Procedure: POLYPECTOMY;  Surgeon: Shaaron Lamar HERO, MD;  Location: [facility];  Service: Endoscopy;;   POLYPECTOMY  05/22/2022   Procedure: POLYPECTOMY;  Surgeon: Shaaron Lamar HERO, MD;  Location: [facility];  Service: Endoscopy;;   RIGHT SHOULDER ARTHROSCOPY/ DISTAL CLAVICLE RESECTION / DEBRIDEMENT ROTATOR CUFF AND LABRUM TEAR/ BURSECTOMY  06-28-2010   IMPINGEMENT SYNDROME/ [facility] JOINT ARTHRITIS/ ROTATOR CUFF TENDINOPATHY   SHOULDER ARTHROSCOPY  08-13-2010   LEFT SHOULDER IMPINGEMENT SYNDROME/ [facility] JOINT   STOMACH SURGERY  2013   antireflux surgery at [facility]   TRANSFORAMINAL LUMBAR INTERBODY FUSION W/ MIS 1 LEVEL N/A 03/20/2020   Procedure: MINIMALLY INVASIVE (MIS) TRANSFORAMINAL LUMBAR INTERBODY FUSION (TLIF) LUMBAR FOUR-LUMBAR FIVE, RIGHT;  Surgeon: Debby Dorn MATSU, MD;  Location: [facility];  Service: Neurosurgery;  Laterality: N/A;  MINIMALLY INVASIVE (MIS) TRANSFORAMINAL LUMBAR INTERBODY FUSION (TLIF) LUMBAR FOUR-LUMBAR FIVE, RIGHT   VAGINAL HYSTERECTOMY  age 20's    OB History     Gravida      Para      Term      Preterm      AB      Living  2      SAB      IAB      Ectopic      Multiple      Live Births               Home Medications    Prior to Admission medications   Medication Sig Start Date End Date Taking? Authorizing Provider  triamcinolone  cream (KENALOG ) 0.1 % Apply 1 Application topically 2 (two) times daily. 08/31/23  Yes Stuart Vernell Norris, PA-C  albuterol  (PROAIR  HFA) 108 [phone] Base) MCG/ACT inhaler Inhale 2 puffs into the lungs  every 4 (four) hours as needed for wheezing or shortness of breath. 02/07/23   Stuart Vernell Norris, PA-C  buprenorphine (BUTRANS) 5 MCG/HR PTWK Place 1 patch onto the skin once a week. 03/23/23   [provider]  Cholecalciferol  (VITAMIN D3) 50 MCG (2000 UT) TABS Take 2,000 Units by mouth in the morning.     [provider]  cyanocobalamin  1000 MCG tablet Take 1 tablet (1,000 mcg total) by mouth 2 (two) times daily.    [provider]  docusate sodium  (COLACE) 100 MG capsule Take 1 capsule (100 mg total) by mouth 2 (two) times daily. 03/21/20   Debby Dorn MATSU, MD  Eszopiclone 3 MG  TABS Take 3 mg by mouth at bedtime. 09/29/22   [provider]  Fluticasone -Umeclidin-Vilant (TRELEGY ELLIPTA) 100-62.5-25 MCG/ACT AEPB Inhale 1 puff into the lungs daily. 02/07/23   Stuart Vernell Norris, PA-C  gabapentin  (NEURONTIN ) 300 MG capsule Take 300 mg by mouth daily. 04/22/23   [provider]  hydrocortisone  (ANUSOL -HC) 2.5 % rectal cream Place 1 Application rectally 2 (two) times daily. 04/29/23   Shirlean Therisa ORN, NP  lubiprostone  (AMITIZA ) 24 MCG capsule Take 1 capsule (24 mcg total) by mouth 2 (two) times daily with a meal. 06/11/23   Shirlean Therisa ORN, NP  metFORMIN (GLUCOPHAGE) 850 MG tablet Take 850 mg by mouth daily with breakfast. 04/22/23   [provider]  mupirocin  ointment (BACTROBAN ) 2 % Apply 1 Application topically 2 (two) times daily. 06/05/23   Arvis Jolan NOVAK, PA-C  oxyCODONE -acetaminophen  (PERCOCET) 10-325 MG tablet Take 1 tablet by mouth 4 (four) times daily as needed. 04/22/23   [provider]  QUEtiapine  (SEROQUEL ) 50 MG tablet Take 50 mg by mouth at bedtime.    [provider]  rosuvastatin (CRESTOR) 20 MG tablet Take 20 mg by mouth at bedtime. 02/08/21   [provider]  Venlafaxine  HCl 225 MG TB24 Take 1 tablet by mouth daily. 04/22/23   [provider]    Family History Family History  Problem Relation Age of  Onset   Colon cancer Mother 67   Hypertension Mother    Heart disease Mother 6       CABG   Alzheimer's disease Father    Hypertension Father    Healthy Daughter    Heart failure Sister    Heart Problems Sister        Cardiac stent    Social History Social History   Tobacco Use   Smoking status: Some Days    Current packs/day: 1.50    Average packs/day: 1.5 packs/day for 35.0 years (52.5 ttl pk-yrs)    Types: Cigarettes   Smokeless tobacco: Never   Tobacco comments:    03/2020 down to 1/2 PPD  Vaping Use   Vaping status: Never Used  Substance Use Topics   Alcohol use: No    Alcohol/week: 0.0 standard drinks of alcohol   Drug use: No     Allergies   Amicinonide-benzyl alcohol [amcinonide], Amoxicillin, Doxycycline, Paxil [paroxetine hcl], Penicillins, Sulfa antibiotics, and Trimethoprim   Review of Systems Review of Systems Per HPI  Physical Exam Triage Vital Signs ED Triage Vitals  Encounter Vitals Group     BP 08/31/23 1246 (!) 162/82     Girls Systolic BP Percentile --      Girls Diastolic BP Percentile --      Boys Systolic BP Percentile --      Boys Diastolic BP Percentile --      Pulse Rate 08/31/23 1246 84     Resp 08/31/23 1246 14     Temp 08/31/23 1246 98.6 F (37 C)     Temp Source 08/31/23 1246 Oral     SpO2 08/31/23 1246 95 %     Weight --      Height --      Head Circumference --      Peak Flow --      Pain Score 08/31/23 1251 4     Pain Loc --      Pain Education --      Exclude from Growth Chart --    No data found.  Updated Vital Signs BP ROLLEN)  162/82 (BP Location: Right Arm)   Pulse 84   Temp 98.6 F (37 C) (Oral)   Resp 14   SpO2 95%   Visual Acuity Right Eye Distance:   Left Eye Distance:   Bilateral Distance:    Right Eye Near:   Left Eye Near:    Bilateral Near:     Physical Exam Vitals and nursing note reviewed.  Constitutional:      Appearance: Normal appearance. She is not ill-appearing.  HENT:     Head:  Atraumatic.  Eyes:     Extraocular Movements: Extraocular movements intact.     Conjunctiva/sclera: Conjunctivae normal.  Cardiovascular:     Rate and Rhythm: Normal rate.  Pulmonary:     Effort: Pulmonary effort is normal. No respiratory distress.  Musculoskeletal:        General: Normal range of motion.     Cervical back: Normal range of motion and neck supple.  Skin:    General: Skin is warm and dry.     Findings: Rash present.     Comments: Erythematous maculopapular and blistering rash in linear streaks to all 4 extremities  Neurological:     Mental Status: She is alert and oriented to person, place, and time.  Psychiatric:        Mood and Affect: Mood normal.        Thought Content: Thought content normal.        Judgment: Judgment normal.      UC Treatments / Results  Labs (all labs ordered are listed, but only abnormal results are displayed) Labs Reviewed - No data to display  EKG   Radiology No results found.  Procedures Procedures (including critical care time)  Medications Ordered in UC Medications  methylPREDNISolone  acetate (DEPO-MEDROL ) injection 40 mg (has no administration in time range)    Initial Impression / Assessment and Plan / UC Course  I have reviewed the triage vital signs and the nursing notes.  Pertinent labs & imaging results that were available during my care of the patient were reviewed by me and considered in my medical decision making (see chart for details).     Unresolving poison ivy dermatitis.  Will treat with Depo-Medrol  IM, triamcinolone  cream, supportive home care.  Complete remainder of prednisone  but concerned given only 6 days about rebound rash if not the additional IM steroid.  Follow-up for worsening or unresolving symptoms.  Final Clinical Impressions(s) / UC Diagnoses   Final diagnoses:  Poison ivy dermatitis   Discharge Instructions   None    ED Prescriptions     Medication Sig Dispense Auth. Provider    triamcinolone  cream (KENALOG ) 0.1 % Apply 1 Application topically 2 (two) times daily. 80 g Stuart Vernell Norris, [place]      PDMP not reviewed this encounter.   Stuart Vernell Norris, [place] 08/31/23 1325

## 2023-11-03 ENCOUNTER — Encounter: Payer: Self-pay | Admitting: Gastroenterology

## 2023-12-21 ENCOUNTER — Other Ambulatory Visit (HOSPITAL_COMMUNITY): Payer: Self-pay | Admitting: Nurse Practitioner

## 2023-12-21 DIAGNOSIS — Z1231 Encounter for screening mammogram for malignant neoplasm of breast: Secondary | ICD-10-CM

## 2023-12-23 ENCOUNTER — Inpatient Hospital Stay (HOSPITAL_COMMUNITY): Admission: RE | Admit: 2023-12-23

## 2023-12-24 ENCOUNTER — Ambulatory Visit (HOSPITAL_COMMUNITY)

## 2023-12-25 ENCOUNTER — Telehealth: Payer: Self-pay | Admitting: Emergency Medicine

## 2023-12-25 ENCOUNTER — Ambulatory Visit
Admission: EM | Admit: 2023-12-25 | Discharge: 2023-12-25 | Disposition: A | Discharge status: Home / Self Care | Attending: Family Medicine | Admitting: Family Medicine

## 2023-12-25 ENCOUNTER — Encounter: Payer: Self-pay | Admitting: Emergency Medicine

## 2023-12-25 DIAGNOSIS — N760 Acute vaginitis: Secondary | ICD-10-CM | POA: Diagnosis not present

## 2023-12-25 DIAGNOSIS — J441 Chronic obstructive pulmonary disease with (acute) exacerbation: Secondary | ICD-10-CM | POA: Diagnosis not present

## 2023-12-25 LAB — POCT URINE DIPSTICK
Bilirubin, UA: NEGATIVE
Blood, UA: NEGATIVE
Glucose, UA: NEGATIVE mg/dL
Ketones, POC UA: NEGATIVE mg/dL
Leukocytes, UA: NEGATIVE
Nitrite, UA: NEGATIVE
POC PROTEIN,UA: 30 — AB
Spec Grav, UA: >=1.03 — AB (ref 1.010–1.025)
Urobilinogen, UA: 0.2 U/dL
pH, UA: 5 (ref 5.0–8.0)

## 2023-12-25 MED ORDER — PROMETHAZINE-DM 6.25-15 MG/5ML PO SYRP: 5.0000 mL | ORAL_SOLUTION | Freq: Four times a day (QID) | ORAL | 0 refills | Status: AC | PRN

## 2023-12-25 MED ORDER — CLINDAMYCIN HCL 300 MG PO CAPS: 300.0000 mg | ORAL_CAPSULE | Freq: Two times a day (BID) | ORAL | 0 refills | Status: AC

## 2023-12-25 MED ORDER — PREDNISONE 20 MG PO TABS: 40.0000 mg | ORAL_TABLET | Freq: Every day | ORAL | 0 refills | Status: DC

## 2023-12-25 MED ORDER — CLINDAMYCIN HCL 300 MG PO CAPS: 300.0000 mg | ORAL_CAPSULE | Freq: Two times a day (BID) | ORAL | 0 refills | Status: DC

## 2023-12-25 MED ORDER — PROMETHAZINE-DM 6.25-15 MG/5ML PO SYRP: 5.0000 mL | ORAL_SOLUTION | Freq: Four times a day (QID) | ORAL | 0 refills | Status: DC | PRN

## 2023-12-25 NOTE — ED Provider Notes (Signed)
 [facility]    CSN: [id number] Arrival date & time: 12/25/23  0950      History   Chief Complaint No chief complaint on file.   HPI Tanya Velazquez is a 63 y.o. female.   Presenting today with 5 to 6-day history of progressively worsening productive cough, wheezing, chest tightness, congestion.  Denies chest pain, abdominal pain, vomiting, diarrhea.  History of COPD on albuterol  as needed.  Also having dysuria, vaginal irritation, yellow discharge for the past 2 weeks.  No known concern for STIs, no new soaps or products.  Not trying anything for symptoms thus far.    Past Medical History:  Diagnosis Date   Anxiety    B12 deficiency anemia    Chronic constipation    Chronic constipation    Chronic low back pain    lumbar radiculopathy   COPD (chronic obstructive pulmonary disease) (HCC)    Diabetes mellitus    DJD (degenerative joint disease)    GERD (gastroesophageal reflux disease) 04/13/07   EGD Dr Rourk->patulous EG junction, HH, antral erosions   H/O hiatal hernia    Hyperplastic colon polyp 06/27/2010   Hypertension    Migraines    Osteoarthritis    Raspy voice    Smokers' cough (HCC)    SUI (stress urinary incontinence, female)     Patient Active Problem List   Diagnosis Date Noted   Lumbar radiculopathy 03/20/2020   AKI (acute kidney injury) 06/11/2019   Syncope and collapse    Hypotension 06/09/2019   Prolapsed internal hemorrhoids, grade 3 05/20/2019   Rectal bleeding 02/04/2019   Rectal pain 02/04/2019   Syncope 10/07/2018   Metatarsal fracture 10/07/2018   Dizziness    Essential hypertension    Chest pain    Gastroesophageal reflux disease    Depression with anxiety    CKD (chronic kidney disease), symptom management only, stage 2 (mild)    Chronic renal failure, stage 3 (moderate)    Acute renal failure (ARF) 08/09/2018   RUQ pain 04/10/2014   Abdominal pain 04/10/2014   Abnormal LFTs 04/10/2014   Intrahepatic bile duct  dilation 04/10/2014   Common bile duct dilatation 04/10/2014   Constipation 06/11/2011   History of gastroesophageal reflux (GERD) 06/20/2010   Family history of colon cancer 06/20/2010    Past Surgical History:  Procedure Laterality Date   APPENDECTOMY     BLADDER SUSPENSION  10/07/2011   Procedure: SPARC PROCEDURE;  Surgeon: Norleen JINNY Seltzer, MD;  [facility];  Service: Urology;  Laterality: N/A;  1 hour requested for this case    CARDIAC CATHETERIZATION  08-09-2004 DR LADONA   NORMAL CORONARY ARTERIES/  NORMAL LVF   CARDIAC CATHETERIZATION  2006   normal coronary arteries   COLONOSCOPY  3/31/9   Dr Rourk->friable anal canal   COLONOSCOPY  0 06/27/10   Dr. Larayne hyperplastic polyp, prominent external hemorrhoid plexus likely source of hematochezia, long tortuous colon. next tcs 06/2015.   COLONOSCOPY WITH PROPOFOL  N/A 04/07/2019   Procedure: COLONOSCOPY WITH PROPOFOL ;  Surgeon: Shaaron Lamar HERO, MD;  [facility];  Service: Endoscopy;  Laterality: N/A;  2:00pm - spoke w/pt, she knows to arrive at 11:30, she could not take the AM slot due to transportation   COLONOSCOPY WITH PROPOFOL  N/A 05/22/2022   Procedure: COLONOSCOPY WITH PROPOFOL ;  Surgeon: Shaaron Lamar HERO, MD;  Location: [facility];  Service: Endoscopy;  Laterality: N/A;  8:15 am   CYSTOSCOPY  10/07/2011  Procedure: CYSTOSCOPY;  Surgeon: Norleen JINNY Seltzer, MD;  Location: [facility];  Service: Urology;  Laterality: N/A;   ESOPHAGEAL MANOMETRY  2013   Dr. Gregory at [facility]. incomplete bolus clearance with some breaks in contractions but there was peristalsis   ESOPHAGOGASTRODUODENOSCOPY  06/27/2010   Schatzki ring s/p dilation, small hiatal hernia, couple tiny antral erosions.   EXCISION VOLAR GANGLION LEFT WRIST  06-27-2005   HEAD-UP TILT TABLE TEST  01-21-2007 &  11-04-2004  DR WEINTRAUB   HX CHRONIC RECURRENT SYNCOPAL. EPICODES--  NEGATIVE RESULT INCLUDING ISUPREL INFUSION   HEMORRHOID  SURGERY  09-13-2003   LAPAROSCOPIC CHOLECYSTECTOMY  02-23-2007   LAPAROTOMY W/ APPENDCTOMY AND LEFT SALPINGO-OOPHECTOMY  AGE 1'S   pH/impedence  2013   Dr. Gregory at [facility]. Increased reflux off PPI with excellent correlation between reflux events and regurgitation. Patient advised to hold PPI for study by Dr. Gregory.    POLYPECTOMY  04/07/2019   Procedure: POLYPECTOMY;  Surgeon: Shaaron Lamar HERO, MD;  [facility];  Service: Endoscopy;;   POLYPECTOMY  05/22/2022   Procedure: POLYPECTOMY;  Surgeon: Shaaron Lamar HERO, MD;  [facility];  Service: Endoscopy;;   RIGHT SHOULDER ARTHROSCOPY/ DISTAL CLAVICLE RESECTION / DEBRIDEMENT ROTATOR CUFF AND LABRUM TEAR/ BURSECTOMY  06-28-2010   IMPINGEMENT SYNDROME/ [facility] JOINT ARTHRITIS/ ROTATOR CUFF TENDINOPATHY   SHOULDER ARTHROSCOPY  08-13-2010   LEFT SHOULDER IMPINGEMENT SYNDROME/ [facility] JOINT   STOMACH SURGERY  2013   antireflux surgery at [facility]   TRANSFORAMINAL LUMBAR INTERBODY FUSION W/ MIS 1 LEVEL N/A 03/20/2020   Procedure: MINIMALLY INVASIVE (MIS) TRANSFORAMINAL LUMBAR INTERBODY FUSION (TLIF) LUMBAR FOUR-LUMBAR FIVE, RIGHT;  Surgeon: Debby Dorn MATSU, MD;  Location: [facility];  Service: Neurosurgery;  Laterality: N/A;  MINIMALLY INVASIVE (MIS) TRANSFORAMINAL LUMBAR INTERBODY FUSION (TLIF) LUMBAR FOUR-LUMBAR FIVE, RIGHT   VAGINAL HYSTERECTOMY  age 59's    OB History     Gravida      Para      Term      Preterm      AB      Living  2      SAB      IAB      Ectopic      Multiple      Live Births               Home Medications    Prior to Admission medications  Medication Sig Start Date End Date Taking? Authorizing Provider  albuterol  (PROAIR  HFA) 108 (90 Base) MCG/ACT inhaler Inhale 2 puffs into the lungs every 4 (four) hours as needed for wheezing or shortness of breath. 02/07/23   Stuart Vernell Norris, PA-C  buprenorphine (BUTRANS) 5 MCG/HR PTWK Place 1 patch onto the skin once a week. 03/23/23    [provider]  Cholecalciferol  (VITAMIN D3) 50 MCG (2000 UT) TABS Take 2,000 Units by mouth in the morning.     [provider]  clindamycin  (CLEOCIN ) 300 MG capsule Take 1 capsule (300 mg total) by mouth 2 (two) times daily. 12/25/23   Stuart Vernell Norris, PA-C  cyanocobalamin  1000 MCG tablet Take 1 tablet (1,000 mcg total) by mouth 2 (two) times daily.    [provider]  docusate sodium  (COLACE) 100 MG capsule Take 1 capsule (100 mg total) by mouth 2 (two) times daily. 03/21/20   Debby Dorn MATSU, MD  Eszopiclone 3 MG TABS Take 3 mg by mouth at bedtime. 09/29/22   [provider]  Fluticasone -Umeclidin-Vilant (TRELEGY ELLIPTA) 100-62.5-25 MCG/ACT AEPB Inhale 1 puff into the lungs daily. 02/07/23   Stuart Vernell Norris, PA-C  gabapentin  (NEURONTIN ) 300 MG capsule Take 300 mg by mouth daily. 04/22/23   [provider]  hydrocortisone  (ANUSOL -HC) 2.5 % rectal cream Place 1 Application rectally 2 (two) times daily. 04/29/23   Shirlean Therisa ORN, NP  lubiprostone  (AMITIZA ) 24 MCG capsule Take 1 capsule (24 mcg total) by mouth 2 (two) times daily with a meal. 06/11/23   Shirlean Therisa ORN, NP  metFORMIN (GLUCOPHAGE) 850 MG tablet Take 850 mg by mouth daily with breakfast. 04/22/23   [provider]  mupirocin  ointment (BACTROBAN ) 2 % Apply 1 Application topically 2 (two) times daily. 06/05/23   Arvis Jolan NOVAK, PA-C  oxyCODONE -acetaminophen  (PERCOCET) 10-325 MG tablet Take 1 tablet by mouth 4 (four) times daily as needed. 04/22/23   [provider]  predniSONE  (DELTASONE ) 20 MG tablet Take 2 tablets (40 mg total) by mouth daily with breakfast. 12/25/23   Stuart Vernell Norris, PA-C  promethazine -dextromethorphan (PROMETHAZINE -DM) 6.25-15 MG/5ML syrup Take 5 mLs by mouth 4 (four) times daily as needed. 12/25/23   Stuart Vernell Norris, PA-C  QUEtiapine  (SEROQUEL ) 50 MG tablet Take 50 mg by mouth at bedtime.    [provider]  rosuvastatin  (CRESTOR) 20 MG tablet Take 20 mg by mouth at bedtime. 02/08/21   [provider]  triamcinolone  cream (KENALOG ) 0.1 % Apply 1 Application topically 2 (two) times daily. 08/31/23   Stuart Vernell Norris, PA-C  Venlafaxine  HCl 225 MG TB24 Take 1 tablet by mouth daily. 04/22/23   [provider]    Family History Family History  Problem Relation Age of Onset   Colon cancer Mother 43   Hypertension Mother    Heart disease Mother 68       CABG   Alzheimer's disease Father    Hypertension Father    Healthy Daughter    Heart failure Sister    Heart Problems Sister        Cardiac stent    Social History Social History[1]   Allergies   Amicinonide-benzyl alcohol [amcinonide], Amoxicillin, Doxycycline, Paxil [paroxetine hcl], Penicillins, Sulfa antibiotics, and Trimethoprim   Review of Systems Review of Systems Per HPI  Physical Exam Triage Vital Signs ED Triage Vitals  Encounter Vitals Group     BP 12/25/23 1018 111/67     Girls Systolic BP Percentile --      Girls Diastolic BP Percentile --      Boys Systolic BP Percentile --      Boys Diastolic BP Percentile --      Pulse Rate 12/25/23 1018 79     Resp 12/25/23 1018 18     Temp 12/25/23 1018 97.7 F (36.5 C)     Temp Source 12/25/23 1018 Oral     SpO2 12/25/23 1018 96 %     Weight --      Height --      Head Circumference --      Peak Flow --      Pain Score 12/25/23 1020 8     Pain Loc --      Pain Education --      Exclude from Growth Chart --    No data found.  Updated Vital Signs BP 111/67 (BP Location: Right Arm)   Pulse 79   Temp 97.7 F (36.5 C) (Oral)   Resp 18   SpO2 96%   Visual Acuity Right Eye Distance:  Left Eye Distance:   Bilateral Distance:    Right Eye Near:   Left Eye Near:    Bilateral Near:     Physical Exam Vitals and nursing note reviewed.  Constitutional:      Appearance: Normal appearance.  HENT:     Head: Atraumatic.     Right Ear: Tympanic membrane  and external ear normal.     Left Ear: Tympanic membrane and external ear normal.     Nose: Rhinorrhea present.     Mouth/Throat:     Mouth: Mucous membranes are moist.     Pharynx: Posterior oropharyngeal erythema present.  Eyes:     Extraocular Movements: Extraocular movements intact.     Conjunctiva/sclera: Conjunctivae normal.  Cardiovascular:     Rate and Rhythm: Normal rate and regular rhythm.     Heart sounds: Normal heart sounds.  Pulmonary:     Effort: Pulmonary effort is normal.     Breath sounds: Wheezing and rales present.  Genitourinary:    Comments: GU exam deferred, self swab performed Musculoskeletal:        General: Normal range of motion.     Cervical back: Normal range of motion and neck supple.  Skin:    General: Skin is warm and dry.  Neurological:     Mental Status: She is alert and oriented to person, place, and time.  Psychiatric:        Mood and Affect: Mood normal.        Thought Content: Thought content normal.      UC Treatments / Results  Labs (all labs ordered are listed, but only abnormal results are displayed) Labs Reviewed  POCT URINE DIPSTICK - Abnormal; Notable for the following components:      Result Value   Clarity, UA cloudy (*)    Spec Grav, UA >=1.030 (*)    POC PROTEIN,UA =30 (*)    All other components within normal limits  CERVICOVAGINAL ANCILLARY ONLY    EKG   Radiology No results found.  Procedures Procedures (including critical care time)  Medications Ordered in UC Medications - No data to display  Initial Impression / Assessment and Plan / UC Course  I have reviewed the triage vital signs and the nursing notes.  Pertinent labs & imaging results that were available during my care of the patient were reviewed by me and considered in my medical decision making (see chart for details).     Vital signs within normal limits, suspect COPD exacerbation secondary to viral versus allergic cause.  Treat with  prednisone , clindamycin , Phenergan  DM, continued inhaler regimen, supportive home care.  Urinalysis without evidence of urinary tract infection, vaginal swab pending for further evaluation of vaginal discharge and irritation.  Return for worsening or unresolving symptoms.  Final Clinical Impressions(s) / UC Diagnoses   Final diagnoses:  Acute vaginitis  COPD exacerbation [facility])   Discharge Instructions   None    ED Prescriptions     Medication Sig Dispense Auth. Provider   predniSONE  (DELTASONE ) 20 MG tablet Take 2 tablets (40 mg total) by mouth daily with breakfast. 10 tablet Stuart Vernell Norris, PA-C   promethazine -dextromethorphan (PROMETHAZINE -DM) 6.25-15 MG/5ML syrup Take 5 mLs by mouth 4 (four) times daily as needed. 100 mL Stuart Vernell Norris, PA-C   clindamycin  (CLEOCIN ) 300 MG capsule Take 1 capsule (300 mg total) by mouth 2 (two) times daily. 14 capsule Stuart Vernell Norris, [place]      PDMP not reviewed this encounter.    [  1]  Social History Tobacco Use   Smoking status: Some Days    Current packs/day: 1.50    Average packs/day: 1.5 packs/day for 35.0 years (52.5 ttl pk-yrs)    Types: Cigarettes   Smokeless tobacco: Never   Tobacco comments:    03/2020 down to 1/2 PPD  Vaping Use   Vaping status: Never Used  Substance Use Topics   Alcohol use: No    Alcohol/week: 0.0 standard drinks of alcohol   Drug use: No     Stuart Vernell Norris, PA-C 12/25/23 1715

## 2023-12-25 NOTE — Telephone Encounter (Signed)
 Patient requested prescriptions be sent to [facility] instead of [address].

## 2023-12-25 NOTE — ED Triage Notes (Signed)
 Productive cough x 4 days.  Also c/o of burning on urination and having a yellow discharge x 2 weeks.

## 2023-12-28 ENCOUNTER — Encounter (HOSPITAL_COMMUNITY): Payer: Self-pay

## 2023-12-28 ENCOUNTER — Ambulatory Visit (HOSPITAL_COMMUNITY): Payer: Self-pay

## 2023-12-28 ENCOUNTER — Inpatient Hospital Stay (HOSPITAL_COMMUNITY)
Admission: RE | Admit: 2023-12-28 | Discharge: 2023-12-28 | Attending: Nurse Practitioner | Admitting: Nurse Practitioner

## 2023-12-28 DIAGNOSIS — Z1231 Encounter for screening mammogram for malignant neoplasm of breast: Secondary | ICD-10-CM | POA: Insufficient documentation

## 2023-12-28 LAB — CERVICOVAGINAL ANCILLARY ONLY
Bacterial Vaginitis (gardnerella): POSITIVE — AB
Candida Glabrata: NEGATIVE
Candida Vaginitis: NEGATIVE
Comment: NEGATIVE
Comment: NEGATIVE
Comment: NEGATIVE

## 2024-01-12 ENCOUNTER — Telehealth: Payer: Self-pay | Admitting: Nurse Practitioner

## 2024-01-12 ENCOUNTER — Ambulatory Visit
Admission: EM | Admit: 2024-01-12 | Discharge: 2024-01-12 | Disposition: A | Discharge status: Home / Self Care | Attending: Nurse Practitioner | Admitting: Nurse Practitioner

## 2024-01-12 ENCOUNTER — Encounter: Payer: Self-pay | Admitting: Emergency Medicine

## 2024-01-12 ENCOUNTER — Other Ambulatory Visit: Payer: Self-pay

## 2024-01-12 ENCOUNTER — Ambulatory Visit (INDEPENDENT_AMBULATORY_CARE_PROVIDER_SITE_OTHER)

## 2024-01-12 DIAGNOSIS — Z8709 Personal history of other diseases of the respiratory system: Secondary | ICD-10-CM

## 2024-01-12 DIAGNOSIS — R079 Chest pain, unspecified: Secondary | ICD-10-CM

## 2024-01-12 DIAGNOSIS — R0602 Shortness of breath: Secondary | ICD-10-CM

## 2024-01-12 DIAGNOSIS — R059 Cough, unspecified: Secondary | ICD-10-CM

## 2024-01-12 LAB — POCT INFLUENZA A/B
Influenza A, POC: NEGATIVE
Influenza B, POC: NEGATIVE

## 2024-01-12 LAB — POC SOFIA SARS ANTIGEN FIA: SARS Coronavirus 2 Ag: NEGATIVE

## 2024-01-12 MED ORDER — PREDNISONE 20 MG PO TABS: 40.0000 mg | ORAL_TABLET | Freq: Every day | ORAL | 0 refills | Status: AC

## 2024-01-12 MED ORDER — BENZONATATE 100 MG PO CAPS: 100.0000 mg | ORAL_CAPSULE | Freq: Three times a day (TID) | ORAL | 0 refills | Status: AC | PRN

## 2024-01-12 NOTE — ED Triage Notes (Signed)
 PT REPORTS OVER THE WEEKEND STARTED GETTING SOB WITH EXERTION AND COUGH IS DIFFERENT THAN NORMAL. PT HAS COPD.  DIARRHEA TODAY TOOK IMODIUM.

## 2024-01-12 NOTE — ED Provider Notes (Addendum)
 " [facility]    CSN: [id number] Arrival date & time: 01/12/24  1222      History   Chief Complaint Chief Complaint  Patient presents with   Cough   Shortness of Breath    HPI Tanya Velazquez is a 63 y.o. female.   The history is provided by the patient.   Patient presents with a 2-day history of cough, shortness of breath, runny nose, and postnasal drainage.  She denies fever, chills, headache, ear pain, abdominal pain, nausea, vomiting, diarrhea, or rash.  Patient also endorses shortness of breath with exertion and chest pain.  She states the chest pain is in the middle of her chest.  States that she does have an underlying history of COPD, but states that the shortness of breath is new.  States that she does use inhalers, also states that she is currently smoking.  Patient states she did attend a funeral and may have come into close contact with someone who was sick.  States that she did take Claritin for her symptoms. Past Medical History:  Diagnosis Date   Anxiety    B12 deficiency anemia    Chronic constipation    Chronic constipation    Chronic low back pain    lumbar radiculopathy   COPD (chronic obstructive pulmonary disease) (HCC)    Diabetes mellitus    DJD (degenerative joint disease)    GERD (gastroesophageal reflux disease) 04/13/07   EGD Dr Rourk->patulous EG junction, HH, antral erosions   H/O hiatal hernia    Hyperplastic colon polyp 06/27/2010   Hypertension    Migraines    Osteoarthritis    Raspy voice    Smokers' cough (HCC)    SUI (stress urinary incontinence, female)     Patient Active Problem List   Diagnosis Date Noted   Lumbar radiculopathy 03/20/2020   AKI (acute kidney injury) 06/11/2019   Syncope and collapse    Hypotension 06/09/2019   Prolapsed internal hemorrhoids, grade 3 05/20/2019   Rectal bleeding 02/04/2019   Rectal pain 02/04/2019   Syncope 10/07/2018   Metatarsal fracture 10/07/2018   Dizziness    Essential  hypertension    Chest pain    Gastroesophageal reflux disease    Depression with anxiety    CKD (chronic kidney disease), symptom management only, stage 2 (mild)    Chronic renal failure, stage 3 (moderate)    Acute renal failure (ARF) 08/09/2018   RUQ pain 04/10/2014   Abdominal pain 04/10/2014   Abnormal LFTs 04/10/2014   Intrahepatic bile duct dilation 04/10/2014   Common bile duct dilatation 04/10/2014   Constipation 06/11/2011   History of gastroesophageal reflux (GERD) 06/20/2010   Family history of colon cancer 06/20/2010    Past Surgical History:  Procedure Laterality Date   APPENDECTOMY     BLADDER SUSPENSION  10/07/2011   Procedure: SPARC PROCEDURE;  Surgeon: Norleen JINNY Seltzer, MD;  Location: [facility];  Service: Urology;  Laterality: N/A;  1 hour requested for this case    CARDIAC CATHETERIZATION  08-09-2004 DR LADONA   NORMAL CORONARY ARTERIES/  NORMAL LVF   CARDIAC CATHETERIZATION  2006   normal coronary arteries   COLONOSCOPY  3/31/9   Dr Rourk->friable anal canal   COLONOSCOPY  0 06/27/10   Dr. Larayne hyperplastic polyp, prominent external hemorrhoid plexus likely source of hematochezia, long tortuous colon. next tcs 06/2015.   COLONOSCOPY WITH PROPOFOL  N/A 04/07/2019   Procedure: COLONOSCOPY WITH PROPOFOL ;  Surgeon: Shaaron Charleston  M, MD;  [facility];  Service: Endoscopy;  Laterality: N/A;  2:00pm - spoke w/pt, she knows to arrive at 11:30, she could not take the AM slot due to transportation   COLONOSCOPY WITH PROPOFOL  N/A 05/22/2022   Procedure: COLONOSCOPY WITH PROPOFOL ;  Surgeon: Shaaron Lamar HERO, MD;  Location: [facility];  Service: Endoscopy;  Laterality: N/A;  8:15 am   CYSTOSCOPY  10/07/2011   Procedure: CYSTOSCOPY;  Surgeon: Norleen JINNY Seltzer, MD;  Location: [facility];  Service: Urology;  Laterality: N/A;   ESOPHAGEAL MANOMETRY  2013   Dr. Gregory at [facility]. incomplete bolus clearance with some breaks in contractions but there  was peristalsis   ESOPHAGOGASTRODUODENOSCOPY  06/27/2010   Schatzki ring s/p dilation, small hiatal hernia, couple tiny antral erosions.   EXCISION VOLAR GANGLION LEFT WRIST  06-27-2005   HEAD-UP TILT TABLE TEST  01-21-2007 &  11-04-2004  DR WEINTRAUB   HX CHRONIC RECURRENT SYNCOPAL. EPICODES--  NEGATIVE RESULT INCLUDING ISUPREL INFUSION   HEMORRHOID SURGERY  09-13-2003   LAPAROSCOPIC CHOLECYSTECTOMY  02-23-2007   LAPAROTOMY W/ APPENDCTOMY AND LEFT SALPINGO-OOPHECTOMY  AGE 56'S   pH/impedence  2013   Dr. Gregory at [facility]. Increased reflux off PPI with excellent correlation between reflux events and regurgitation. Patient advised to hold PPI for study by Dr. Gregory.    POLYPECTOMY  04/07/2019   Procedure: POLYPECTOMY;  Surgeon: Shaaron Lamar HERO, MD;  [facility]: [facility];  Service: Endoscopy;;   POLYPECTOMY  05/22/2022   Procedure: POLYPECTOMY;  Surgeon: Shaaron Lamar HERO, MD;  [facility];  Service: Endoscopy;;   RIGHT SHOULDER ARTHROSCOPY/ DISTAL CLAVICLE RESECTION / DEBRIDEMENT ROTATOR CUFF AND LABRUM TEAR/ BURSECTOMY  06-28-2010   IMPINGEMENT SYNDROME/ [facility] JOINT ARTHRITIS/ ROTATOR CUFF TENDINOPATHY   SHOULDER ARTHROSCOPY  08-13-2010   LEFT SHOULDER IMPINGEMENT SYNDROME/ [facility] JOINT   STOMACH SURGERY  2013   antireflux surgery at [facility]   TRANSFORAMINAL LUMBAR INTERBODY FUSION W/ MIS 1 LEVEL N/A 03/20/2020   Procedure: MINIMALLY INVASIVE (MIS) TRANSFORAMINAL LUMBAR INTERBODY FUSION (TLIF) LUMBAR FOUR-LUMBAR FIVE, RIGHT;  Surgeon: Debby Dorn MATSU, MD;  Location: [facility];  Service: Neurosurgery;  Laterality: N/A;  MINIMALLY INVASIVE (MIS) TRANSFORAMINAL LUMBAR INTERBODY FUSION (TLIF) LUMBAR FOUR-LUMBAR FIVE, RIGHT   VAGINAL HYSTERECTOMY  age 67's    OB History     Gravida      Para      Term      Preterm      AB      Living  2      SAB      IAB      Ectopic      Multiple      Live Births               Home Medications    Prior to Admission  medications  Medication Sig Start Date End Date Taking? Authorizing Provider  albuterol  (PROAIR  HFA) 108 (90 Base) MCG/ACT inhaler Inhale 2 puffs into the lungs every 4 (four) hours as needed for wheezing or shortness of breath. 02/07/23   Stuart Vernell Norris, PA-C  buprenorphine (BUTRANS) 5 MCG/HR PTWK Place 1 patch onto the skin once a week. 03/23/23   [provider]  Cholecalciferol  (VITAMIN D3) 50 MCG (2000 UT) TABS Take 2,000 Units by mouth in the morning.     [provider]  clindamycin  (CLEOCIN ) 300 MG capsule Take 1 capsule (300 mg total) by mouth 2 (two) times daily. 12/25/23  Stuart Vernell Norris, PA-C  cyanocobalamin  1000 MCG tablet Take 1 tablet (1,000 mcg total) by mouth 2 (two) times daily.    [provider]  docusate sodium  (COLACE) 100 MG capsule Take 1 capsule (100 mg total) by mouth 2 (two) times daily. 03/21/20   Debby Dorn MATSU, MD  Eszopiclone 3 MG TABS Take 3 mg by mouth at bedtime. 09/29/22   [provider]  Fluticasone -Umeclidin-Vilant (TRELEGY ELLIPTA) 100-62.5-25 MCG/ACT AEPB Inhale 1 puff into the lungs daily. 02/07/23   Stuart Vernell Norris, PA-C  gabapentin  (NEURONTIN ) 300 MG capsule Take 300 mg by mouth daily. 04/22/23   [provider]  hydrocortisone  (ANUSOL -HC) 2.5 % rectal cream Place 1 Application rectally 2 (two) times daily. 04/29/23   Shirlean Therisa ORN, NP  lubiprostone  (AMITIZA ) 24 MCG capsule Take 1 capsule (24 mcg total) by mouth 2 (two) times daily with a meal. 06/11/23   Shirlean Therisa ORN, NP  metFORMIN (GLUCOPHAGE) 850 MG tablet Take 850 mg by mouth daily with breakfast. 04/22/23   [provider]  mupirocin  ointment (BACTROBAN ) 2 % Apply 1 Application topically 2 (two) times daily. 06/05/23   Arvis Jolan NOVAK, PA-C  oxyCODONE -acetaminophen  (PERCOCET) 10-325 MG tablet Take 1 tablet by mouth 4 (four) times daily as needed. 04/22/23   [provider]  predniSONE  (DELTASONE ) 20 MG tablet Take 2 tablets (40  mg total) by mouth daily with breakfast. 12/25/23   Stuart Vernell Norris, PA-C  promethazine -dextromethorphan (PROMETHAZINE -DM) 6.25-15 MG/5ML syrup Take 5 mLs by mouth 4 (four) times daily as needed. 12/25/23   Stuart Vernell Norris, PA-C  QUEtiapine  (SEROQUEL ) 50 MG tablet Take 50 mg by mouth at bedtime.    [provider]  rosuvastatin (CRESTOR) 20 MG tablet Take 20 mg by mouth at bedtime. 02/08/21   [provider]  triamcinolone  cream (KENALOG ) 0.1 % Apply 1 Application topically 2 (two) times daily. 08/31/23   Stuart Vernell Norris, PA-C  Venlafaxine  HCl 225 MG TB24 Take 1 tablet by mouth daily. 04/22/23   [provider]    Family History Family History  Problem Relation Age of Onset   Colon cancer Mother 36   Hypertension Mother    Heart disease Mother 42       CABG   Alzheimer's disease Father    Hypertension Father    Healthy Daughter    Heart failure Sister    Heart Problems Sister        Cardiac stent    Social History Social History[1]   Allergies   Amicinonide-benzyl alcohol [amcinonide], Amoxicillin, Doxycycline, Paxil [paroxetine hcl], Penicillins, Sulfa antibiotics, and Trimethoprim   Review of Systems Review of Systems Per HPI  Physical Exam Triage Vital Signs ED Triage Vitals  Encounter Vitals Group     BP 01/12/24 1328 (!) 146/78     Girls Systolic BP Percentile --      Girls Diastolic BP Percentile --      Boys Systolic BP Percentile --      Boys Diastolic BP Percentile --      Pulse Rate 01/12/24 1328 82     Resp 01/12/24 1328 19     Temp 01/12/24 1328 98.5 F (36.9 C)     Temp Source 01/12/24 1328 Oral     SpO2 01/12/24 1229 97 %     Weight --      Height --      Head Circumference --      Peak Flow --  Pain Score 01/12/24 1327 7     Pain Loc --      Pain Education --      Exclude from Growth Chart --    No data found.  Updated Vital Signs BP (!) 146/78 (BP Location: Right Arm)   Pulse 82   Temp 98.5  F (36.9 C) (Oral)   Resp 19   SpO2 98%   Visual Acuity Right Eye Distance:   Left Eye Distance:   Bilateral Distance:    Right Eye Near:   Left Eye Near:    Bilateral Near:     Physical Exam Vitals and nursing note reviewed.  Constitutional:      General: She is not in acute distress.    Appearance: She is well-developed.  HENT:     Head: Normocephalic.     Right Ear: Tympanic membrane, ear canal and external ear normal.     Left Ear: Tympanic membrane, ear canal and external ear normal.     Nose: Congestion present.     Mouth/Throat:     Lips: Pink.     Mouth: Mucous membranes are moist.     Pharynx: Postnasal drip present. No pharyngeal swelling, oropharyngeal exudate, posterior oropharyngeal erythema or uvula swelling.     Comments: Cobblestoning present to posterior oropharynx  Eyes:     Extraocular Movements: Extraocular movements intact.     Pupils: Pupils are equal, round, and reactive to light.  Cardiovascular:     Rate and Rhythm: Normal rate and regular rhythm.     Pulses: Normal pulses.     Heart sounds: Normal heart sounds.  Pulmonary:     Effort: Pulmonary effort is normal. No respiratory distress.     Breath sounds: Normal breath sounds. No stridor. No wheezing, rhonchi or rales.  Abdominal:     General: Bowel sounds are normal.     Palpations: Abdomen is soft.  Musculoskeletal:     Cervical back: Normal range of motion.  Skin:    General: Skin is warm and dry.  Neurological:     General: No focal deficit present.     Mental Status: She is alert and oriented to person, place, and time.  Psychiatric:        Mood and Affect: Mood normal.        Behavior: Behavior normal.      UC Treatments / Results  Labs (all labs ordered are listed, but only abnormal results are displayed) Labs Reviewed  POCT INFLUENZA A/B - Normal  POC SOFIA SARS ANTIGEN FIA    EKG: Normal sinus rhythm, no STEMI.  Compared to EKGs dated 09/24/2021, 06/14/2019, and  10/08/2018.   Radiology No results found.  Procedures Procedures (including critical care time)  Medications Ordered in UC Medications - No data to display  Initial Impression / Assessment and Plan / UC Course  I have reviewed the triage vital signs and the nursing notes.  Pertinent labs & imaging results that were available during my care of the patient were reviewed by me and considered in my medical decision making (see chart for details).  Patient presents with a 2-day history of chest pain and shortness of breath.  She does have underlying history of COPD.  States that her shortness of breath is with exertion.  She is continuing to smoke at this time.  Baseline O2 sat 98%.  No abnormal breath sounds heard on exam.  EKG shows normal sinus rhythm, no STEMI.  Chest x-ray is pending.  Patient was recently treated for COPD exacerbation over the past 2 weeks.  Influenza test and COVID test were negative.  Will prescribe prednisone  20 mg for the next 5 days; however, patient was advised to only take it if her symptoms worsen.  Will have patient continue her current COPD medication regimen at this time.  Tessalon 100 mg prescribed for cough.  Will notify patient if additional medications are warranted once the results of her chest x-ray are received.  Supportive care recommendations were provided and discussed with the patient to include fluids, rest, over-the-counter Tylenol , smoking sensation, and use of a humidifier during sleep.  Patient was given strict ER follow-up precautions.  Patient was in agreement with this plan of care and verbalizes understanding.  All questions were answered.  Patient stable for discharge.  Work note was provided.  Final Clinical Impressions(s) / UC Diagnoses   Final diagnoses:  Shortness of breath  Chest pain, unspecified type   Discharge Instructions   None    ED Prescriptions   None    PDMP not reviewed this encounter.    Gilmer Etta PARAS,  NP 01/12/24 1427     [1]  Social History Tobacco Use   Smoking status: Some Days    Current packs/day: 1.50    Average packs/day: 1.5 packs/day for 35.0 years (52.5 ttl pk-yrs)    Types: Cigarettes   Smokeless tobacco: Never   Tobacco comments:    03/2020 down to 1/2 PPD  Vaping Use   Vaping status: Never Used  Substance Use Topics   Alcohol use: No    Alcohol/week: 0.0 standard drinks of alcohol   Drug use: No     Gilmer Etta PARAS, NP 01/12/24 1429  "

## 2024-01-12 NOTE — Telephone Encounter (Signed)
 Called patient to discuss chest x-ray results.  Reached voicemail, left message asking the patient to return the phone call.

## 2024-01-12 NOTE — Discharge Instructions (Addendum)
 The chest x-ray results are pending.  You will be contacted if the x-ray results are abnormal.  You will also have access to the results via MyChart. Take medication as prescribed.  A prescription for prednisone  has been sent to your pharmacy.  Start the prednisone  if you develop worsening cough, persistent wheezing, or other concerns. Increase fluids and allow for plenty of rest. You may take over-the-counter Tylenol  as needed for pain, fever, general discomfort. Recommend the use of a humidifier in your bedroom at nighttime during sleep and sleeping elevated on pillows while cough symptoms persist. Please consider smoking cessation as smoking will continue to worsen your COPD. Go to the emergency department if you experience worsening chest pain, shortness of breath, difficulty breathing, or other concerns. Follow-up with your pulmonologist within the next 7 to 10 days for reevaluation. Follow-up as needed.

## 2024-01-13 ENCOUNTER — Ambulatory Visit (HOSPITAL_COMMUNITY): Payer: Self-pay

## 2024-02-01 ENCOUNTER — Other Ambulatory Visit (HOSPITAL_COMMUNITY): Payer: Self-pay | Admitting: Surgery

## 2024-02-01 DIAGNOSIS — M5412 Radiculopathy, cervical region: Secondary | ICD-10-CM

## 2024-02-01 DIAGNOSIS — M5416 Radiculopathy, lumbar region: Secondary | ICD-10-CM

## 2024-02-09 ENCOUNTER — Ambulatory Visit (HOSPITAL_COMMUNITY)

## 2024-02-15 ENCOUNTER — Ambulatory Visit (HOSPITAL_COMMUNITY)

## 2024-02-15 ENCOUNTER — Ambulatory Visit (HOSPITAL_COMMUNITY): Admission: RE | Admit: 2024-02-15 | Source: Ambulatory Visit

## 2024-02-23 ENCOUNTER — Ambulatory Visit (HOSPITAL_COMMUNITY)

## 2024-03-01 ENCOUNTER — Ambulatory Visit (HOSPITAL_COMMUNITY)

## 2024-07-01 ENCOUNTER — Ambulatory Visit: Payer: Self-pay
